# Patient Record
Sex: Female | Born: 1935 | Race: White | Hispanic: No | Smoking: Former smoker
Health system: Southern US, Community
[De-identification: ages and names within clinical notes are randomized; demographics above are authoritative.]

## PROBLEM LIST (undated history)

## (undated) DIAGNOSIS — E785 Hyperlipidemia, unspecified: Secondary | ICD-10-CM

## (undated) DIAGNOSIS — E039 Hypothyroidism, unspecified: Secondary | ICD-10-CM

## (undated) DIAGNOSIS — C50919 Malignant neoplasm of unspecified site of unspecified female breast: Secondary | ICD-10-CM

## (undated) DIAGNOSIS — I10 Essential (primary) hypertension: Secondary | ICD-10-CM

## (undated) DIAGNOSIS — H5440 Blindness, one eye, unspecified eye: Secondary | ICD-10-CM

## (undated) DIAGNOSIS — I252 Old myocardial infarction: Secondary | ICD-10-CM

## (undated) DIAGNOSIS — E559 Vitamin D deficiency, unspecified: Secondary | ICD-10-CM

## (undated) DIAGNOSIS — R296 Repeated falls: Secondary | ICD-10-CM

## (undated) DIAGNOSIS — J449 Chronic obstructive pulmonary disease, unspecified: Secondary | ICD-10-CM

## (undated) DIAGNOSIS — I2510 Atherosclerotic heart disease of native coronary artery without angina pectoris: Secondary | ICD-10-CM

## (undated) DIAGNOSIS — I509 Heart failure, unspecified: Secondary | ICD-10-CM

## (undated) DIAGNOSIS — I447 Left bundle-branch block, unspecified: Secondary | ICD-10-CM

## (undated) DIAGNOSIS — I951 Orthostatic hypotension: Secondary | ICD-10-CM

## (undated) DIAGNOSIS — C4491 Basal cell carcinoma of skin, unspecified: Secondary | ICD-10-CM

## (undated) DIAGNOSIS — I255 Ischemic cardiomyopathy: Secondary | ICD-10-CM

## (undated) DIAGNOSIS — J988 Other specified respiratory disorders: Secondary | ICD-10-CM

## (undated) DIAGNOSIS — G459 Transient cerebral ischemic attack, unspecified: Secondary | ICD-10-CM

## (undated) HISTORY — DX: Hypothyroidism, unspecified: E03.9

## (undated) HISTORY — PX: CARDIAC DEFIBRILLATOR PLACEMENT: SHX171

## (undated) HISTORY — DX: Old myocardial infarction: I25.2

## (undated) HISTORY — DX: Repeated falls: R29.6

## (undated) HISTORY — DX: Transient cerebral ischemic attack, unspecified: G45.9

## (undated) HISTORY — DX: Chronic obstructive pulmonary disease, unspecified: J44.9

## (undated) HISTORY — PX: MASTECTOMY: SHX3

## (undated) HISTORY — DX: Other specified respiratory disorders: J98.8

## (undated) HISTORY — DX: Basal cell carcinoma of skin, unspecified: C44.91

## (undated) HISTORY — DX: Left bundle-branch block, unspecified: I44.7

## (undated) HISTORY — PX: BASAL CELL CARCINOMA EXCISION: SHX1214

## (undated) HISTORY — PX: INSERT / REPLACE / REMOVE PACEMAKER: SUR710

## (undated) HISTORY — DX: Atherosclerotic heart disease of native coronary artery without angina pectoris: I25.10

## (undated) HISTORY — PX: ABDOMINAL HYSTERECTOMY: SHX81

## (undated) HISTORY — DX: Malignant neoplasm of unspecified site of unspecified female breast: C50.919

## (undated) HISTORY — DX: Hyperlipidemia, unspecified: E78.5

## (undated) HISTORY — DX: Essential (primary) hypertension: I10

## (undated) HISTORY — DX: Ischemic cardiomyopathy: I25.5

## (undated) HISTORY — PX: BREAST EXCISIONAL BIOPSY: SUR124

## (undated) HISTORY — DX: Vitamin D deficiency, unspecified: E55.9

## (undated) HISTORY — DX: Blindness, one eye, unspecified eye: H54.40

---

## 2007-03-10 ENCOUNTER — Encounter: Payer: Self-pay | Admitting: Cardiovascular Disease

## 2007-05-04 ENCOUNTER — Encounter: Admission: RE | Admit: 2007-05-04 | Discharge: 2007-05-04 | Payer: Self-pay | Admitting: Internal Medicine

## 2007-05-26 ENCOUNTER — Emergency Department: Payer: Self-pay | Admitting: Emergency Medicine

## 2007-05-30 ENCOUNTER — Emergency Department: Payer: Self-pay | Admitting: Unknown Physician Specialty

## 2007-06-11 ENCOUNTER — Ambulatory Visit: Payer: Self-pay | Admitting: Internal Medicine

## 2008-01-04 ENCOUNTER — Encounter: Payer: Self-pay | Admitting: Cardiovascular Disease

## 2008-01-20 ENCOUNTER — Encounter: Payer: Self-pay | Admitting: Cardiovascular Disease

## 2008-04-18 ENCOUNTER — Ambulatory Visit: Payer: Self-pay | Admitting: Gastroenterology

## 2008-07-08 ENCOUNTER — Emergency Department: Payer: Self-pay | Admitting: Internal Medicine

## 2008-08-19 ENCOUNTER — Other Ambulatory Visit: Payer: Self-pay

## 2008-08-19 ENCOUNTER — Inpatient Hospital Stay: Payer: Self-pay | Admitting: Internal Medicine

## 2008-10-25 ENCOUNTER — Ambulatory Visit: Payer: Self-pay | Admitting: Cardiovascular Disease

## 2008-10-25 ENCOUNTER — Encounter: Payer: Self-pay | Admitting: Internal Medicine

## 2009-03-13 ENCOUNTER — Encounter: Payer: Self-pay | Admitting: Cardiovascular Disease

## 2009-03-13 ENCOUNTER — Encounter: Payer: Self-pay | Admitting: Internal Medicine

## 2009-08-21 ENCOUNTER — Encounter: Payer: Self-pay | Admitting: Internal Medicine

## 2009-09-21 ENCOUNTER — Encounter: Payer: Self-pay | Admitting: Internal Medicine

## 2009-10-10 ENCOUNTER — Encounter: Payer: Self-pay | Admitting: Internal Medicine

## 2009-10-19 ENCOUNTER — Ambulatory Visit: Payer: Self-pay | Admitting: Otolaryngology

## 2009-10-23 ENCOUNTER — Encounter: Admission: RE | Admit: 2009-10-23 | Discharge: 2009-10-23 | Payer: Self-pay | Admitting: Internal Medicine

## 2009-10-23 ENCOUNTER — Ambulatory Visit: Payer: Self-pay | Admitting: Internal Medicine

## 2009-10-23 DIAGNOSIS — I10 Essential (primary) hypertension: Secondary | ICD-10-CM | POA: Insufficient documentation

## 2009-10-23 DIAGNOSIS — E782 Mixed hyperlipidemia: Secondary | ICD-10-CM | POA: Insufficient documentation

## 2009-10-23 DIAGNOSIS — I2510 Atherosclerotic heart disease of native coronary artery without angina pectoris: Secondary | ICD-10-CM | POA: Insufficient documentation

## 2009-11-17 DIAGNOSIS — C4491 Basal cell carcinoma of skin, unspecified: Secondary | ICD-10-CM

## 2009-11-17 HISTORY — DX: Basal cell carcinoma of skin, unspecified: C44.91

## 2009-12-09 ENCOUNTER — Emergency Department: Payer: Self-pay | Admitting: Internal Medicine

## 2009-12-17 ENCOUNTER — Observation Stay: Payer: Self-pay | Admitting: Internal Medicine

## 2010-01-28 ENCOUNTER — Encounter: Payer: Self-pay | Admitting: Internal Medicine

## 2010-02-06 ENCOUNTER — Ambulatory Visit: Payer: Self-pay | Admitting: Internal Medicine

## 2010-02-12 ENCOUNTER — Encounter: Payer: Self-pay | Admitting: Internal Medicine

## 2010-03-01 ENCOUNTER — Encounter: Payer: Self-pay | Admitting: Cardiovascular Disease

## 2010-03-05 ENCOUNTER — Encounter: Payer: Self-pay | Admitting: Cardiovascular Disease

## 2010-04-26 ENCOUNTER — Encounter: Payer: Self-pay | Admitting: Cardiovascular Disease

## 2010-07-09 ENCOUNTER — Encounter: Payer: Self-pay | Admitting: Cardiovascular Disease

## 2010-07-30 ENCOUNTER — Ambulatory Visit: Payer: Self-pay | Admitting: Cardiovascular Disease

## 2010-08-05 ENCOUNTER — Encounter: Payer: Self-pay | Admitting: Cardiovascular Disease

## 2010-08-06 ENCOUNTER — Encounter: Payer: Self-pay | Admitting: Cardiovascular Disease

## 2010-11-05 ENCOUNTER — Encounter
Admission: RE | Admit: 2010-11-05 | Discharge: 2010-11-05 | Payer: Self-pay | Source: Home / Self Care | Attending: Family Medicine | Admitting: Family Medicine

## 2010-11-06 ENCOUNTER — Telehealth: Payer: Self-pay | Admitting: Cardiovascular Disease

## 2010-12-08 ENCOUNTER — Encounter: Payer: Self-pay | Admitting: Family Medicine

## 2010-12-18 NOTE — Letter (Signed)
Summary: PHI  PHI   Imported By: Harlon Flor 08/05/2010 13:01:21  _____________________________________________________________________  External Attachment:    Type:   Image     Comment:   External Document

## 2010-12-18 NOTE — Letter (Signed)
Summary: Remote Device Check  [employer], Main Office  [address]   [place], [place] [address]   Phone: [phone]  Fax: [phone]     February 12, 2010 MRN: [id number]   [facility] [address] [place], [place]  [address]   Dear Ms. Rikard,   Your remote transmission was recieved and reviewed by your physician.  All diagnostics were within normal limits for you.   ___X___Your next office visit is scheduled for:  JUNE 2011 WITH DR Graciela Husbands. Please call our office to schedule an appointment.    Sincerely,  Proofreader

## 2010-12-18 NOTE — Letter (Signed)
Summary: [facility]  [facility]   Imported By: Harlon Flor 08/07/2010 16:19:01  _____________________________________________________________________  External Attachment:    Type:   Image     Comment:   External Document

## 2010-12-18 NOTE — Assessment & Plan Note (Signed)
Summary: NP6/AMD   Visit Type:  Initial Consult Primary Provider:  Dr. Andrey Spearman  CC:  Establish care with a cardiologist. Former patient of Dr. Kirke Corin..  History of Present Illness: Erica [name] is a 77 year old lady with Nonischemic cardiomyopathy CRT-D that was implanted at [facility] about one year ago with class 2-3 congestive heart failure with left bundle branch block, coronary artery disease with stenting to her RCA, hyperlipidemia, hypertension who presents to establish care.  overall she feels well. She denies any chest pain. She does have some shortness of breath with exertion. She denies any cough or lower extremity edema. Overall she reports that she is stable with no complaints. She has always had an elevated heart rate. She has not been able to tolerate Coreg 25 mg b.i.d.  Echocardiogram April 2011 showing mild LV systolic dysfunction, ejection fraction 43%, mildly dilated left atrium, mild pulmonary hypertension with right ventricular systolic pressure of 33  Stress test April 2011 showing normal study with normal ejection fraction. Ejection fraction estimated at 79%. This was a lack CT scan.  Notes indicate cardiac catheterization in January 2008 showing 40% restenosis of mid RCA stent and 50% mid LAD disease. Notes indicate 2 stents in her RCA.  Catheterization October 2009 showing 40-50% LAD disease, 40% RCA disease, ejection fraction less than 20%.   CT scan report with concerns regarding descriptions of subclavian stenosis  EKG shows paced rhythm at 88 beats per minute Cholesterol from January shows LDL 112, total cholesterol 184, HDL 49    Current Medications (verified): 1)  Aspirin 81 Mg Tbec (Aspirin) .... Take One Tablet By Mouth Daily 2)  Diovan 80 Mg Tabs (Valsartan) .... Take One Tablet By Mouth Daily 3)  Carvedilol 12.5 Mg Tabs (Carvedilol) .... Take One Tablet By Mouth Twice A Day 4)  Furosemide 40 Mg Tabs (Furosemide) .... Take One Tablet By Mouth Daily As  Needed 5)  Plavix 75 Mg Tabs (Clopidogrel Bisulfate) .... Take One Tablet By Mouth Daily 6)  Calcium + D 600-200 Mg-Unit Tabs (Calcium Carbonate-Vitamin D) .... Take One Tablet By Mouth Once Daily. 7)  Vitamin B-12 1000 Mcg Tabs (Cyanocobalamin) .... Take One Tablet By Mouth Once Daily. 8)  Fish Oil   Oil (Fish Oil) .... 1000mg  Take One Tablet By Mouth Once Daily. 9)  Vitamin C 500 Mg Tabs (Ascorbic Acid) .... Take One Tablet By Mouth Once Daily. 10)  Levothyroxine Sodium 25 Mcg Tabs (Levothyroxine Sodium) .... Take One Tablet By Mouth Once Daily. 11)  Lorazepam 1 Mg Tabs (Lorazepam) .... One Tablet At Bedtime 12)  Prilosec 20 Mg Cpdr (Omeprazole) .... Take One Tablet By Mouth Once Daily. 13)  Iron Supplement 325 (65 Fe) Mg Tabs (Ferrous Sulfate) .... One Tablet Once Daily 14)  Fenofibrate 160 Mg Tabs (Fenofibrate) .... One Tablet Once Daily  Allergies (verified): 1)  ! Ace Inhibitors 2)  ! * Caduet 3)  ! Cipro 4)  ! * Eyrthromycin Derivatives 5)  ! * Quinolones 6)  ! * Johavahs Witness-No Blood Transfusions!  Past History:  Family History: Last updated: 10/23/2009 Family History of Cancer:  Family History of Coronary Artery Disease:  Family History of Hypertension:   Social History: Last updated: 10/23/2009 Retired  Married  Tobacco Use - No.  Alcohol Use - no  Risk Factors: Smoking Status: never (10/23/2009)  Past Medical History: Congestive heart failure-systolic Coronary artery disease status post MI x2 Stents-RCA x2 Status post CRT D. cx by LSCVenous thrombosis Nonischemic/ischemic cardiomyopathy dyslipidemia Hypertension COPD Breast  Cancer Hypothyroidism Pneumonia pacemaker/Defib  Past Surgical History: Mastectomy Hysterectomy pacermaker/Defib implant 2010  Review of Systems       The patient complains of dyspnea on exertion.  The patient denies fever, weight loss, weight gain, vision loss, decreased hearing, hoarseness, chest pain, syncope, peripheral  edema, prolonged cough, abdominal pain, incontinence, muscle weakness, depression, and enlarged lymph nodes.    Vital Signs:  Patient profile:   77 year old female Height:      62 inches Weight:      156 pounds BMI:     28.64 Pulse rate:   93 / minute BP sitting:   126 / 75  (left arm) Cuff size:   regular  Vitals Entered By: Bishop Dublin, CMA (July 30, 2010 3:22 PM)  Physical Exam  General:  Well developed, well nourished, in no acute distress. Head:  normocephalic and atraumatic Neck:  Neck supple, no JVD. No masses, thyromegaly or abnormal cervical nodes. Lungs:  Clear bilaterally to auscultation and percussion. Heart:  Non-displaced PMI, chest non-tender; regular rate and rhythm, S1, S2 without murmurs, rubs or gallops. Carotid upstroke normal, no bruit. Pedals normal pulses. No edema, no varicosities. Abdomen:   abdomen soft and non-tender without masses Msk:  Back normal, normal gait. Muscle strength and tone normal. Pulses:  pulses normal in all 4 extremities Extremities:  No clubbing or cyanosis. Neurologic:  Alert and oriented x 3. Skin:  Intact without lesions or rashes. Psych:  Normal affect.    ICD Specifications Following MD:  Sherryl Manges, MD     ICD Vendor:  [employer]     ICD Model Number:  [id number]     ICD Serial Number:  [id number] ICD DOI:  12/01/2008     ICD Implanting MD:  NOT IMPLANTED HERE  Lead 1:    Location: RA     DOI: 12/01/2008     Model #: [id number]     Serial #: [id number]     Status: active Lead 2:    Location: RV     DOI: 12/01/2008     Model #: [id number]     Serial #: [id number]     Status: active Lead 3:    Location: LV     DOI: 12/01/2008     Model #: [id number]     Serial #: [id number]     Status: active  Indications::  CHF, CARDIOMYOPATHY   ICD Follow Up ICD Dependent:  No       ICD Device Measurements Configuration: BIPOLAR  Brady Parameters Mode DDD     Lower Rate Limit:  50     Upper Rate Limit 130 PAV 130     Sensed AV Delay:   100  Tachy Zones VF:  200     VT:  182     Impression & Recommendations:  Problem # 1:  CHRONIC SYSTOLIC HEART FAILURE (ICD-428.22) on clinical exam, she appears to be relatively stable with no significant fluid overload. She does have mild shortness of breath with exertion which may be secondary to her underlying LV dysfunction, general deconditioned state.  She has complained about the cost of Diovan and we will change her to losartan 25 mg daily, possibly titrating up to 50 mg if tolerated. She has been unable to tolerate Coreg 25 mg daily and her heart rate is elevated. On her next visit, we will suggest she try a slightly higher dose of Coreg (18 mg two times a day).  Her updated medication list for this  problem includes:    Aspirin 81 Mg Tbec (Aspirin) .Marland Kitchen... Take one tablet by mouth daily    Losartan Potassium 50 Mg Tabs (Losartan potassium) .Marland Kitchen... Take 1/2  tablet by mouth once a day    Carvedilol 12.5 Mg Tabs (Carvedilol) .Marland Kitchen... Take one tablet by mouth twice a day    Furosemide 40 Mg Tabs (Furosemide) .Marland Kitchen... Take one tablet by mouth daily as needed    Plavix 75 Mg Tabs (Clopidogrel bisulfate) .Marland Kitchen... Take one tablet by mouth daily  Problem # 2:  HYPERLIPIDEMIA, MIXED (ICD-272.2) We have talked to her about her cholesterol, the fact that she has underlying coronary artery disease and a history of stenting to her RCA. We will start her on simvastatin 10 mg every other day and hopefully titrate this upwards as tolerated. She does have a history of leg cramping. She can hold her fenofibrate and she is not taking this anyway.  Her updated medication list for this problem includes:    Fenofibrate 160 Mg Tabs (Fenofibrate) ..... One tablet once daily    Simvastatin 20 Mg Tabs (Simvastatin) .Marland Kitchen... Take 1/2 tablet by mouth daily at bedtime  Problem # 3:  HYPERTENSION (ICD-401.9) Blood pressure is well controlled on today's visit. We will change her Diovan to losartan at her request.  Her  updated medication list for this problem includes:    Aspirin 81 Mg Tbec (Aspirin) .Marland Kitchen... Take one tablet by mouth daily    Losartan Potassium 50 Mg Tabs (Losartan potassium) .Marland Kitchen... Take 1/2  tablet by mouth once a day    Carvedilol 12.5 Mg Tabs (Carvedilol) .Marland Kitchen... Take one tablet by mouth twice a day    Furosemide 40 Mg Tabs (Furosemide) .Marland Kitchen... Take one tablet by mouth daily as needed  Problem # 4:  CAD (ICD-414.00) She does have underlying coronary artery disease and we need to push her lipids to goal. Current LDL is 112.  Her updated medication list for this problem includes:    Aspirin 81 Mg Tbec (Aspirin) .Marland Kitchen... Take one tablet by mouth daily    Carvedilol 12.5 Mg Tabs (Carvedilol) .Marland Kitchen... Take one tablet by mouth twice a day    Plavix 75 Mg Tabs (Clopidogrel bisulfate) .Marland Kitchen... Take one tablet by mouth daily  Patient Instructions: 1)  Your physician has recommended you make the following change in your medication: START simvastatin 20mg  take 1/2 tab every other day, losartan 50mg  take 1/2 tab. STOP diovan  2)  Your physician wants you to follow-up in:   6 months You will receive a reminder letter in the mail two months in advance. If you don't receive a letter, please call our office to schedule the follow-up appointment. Prescriptions: SIMVASTATIN 20 MG TABS (SIMVASTATIN) Take 1/2 tablet by mouth daily at bedtime  #30 x 4   Entered by:   Benedict Needy, RN   Authorized by:   Dossie Arbour MD   Signed by:   Benedict Needy, RN on 07/30/2010   Method used:   Electronically to        [id number] (retail[facility]       [address]       [place], [place]  [address]       Ph: [phone]       Fax: [phone]   RxID:   [id number] LOSARTAN POTASSIUM 50 MG TABS (LOSARTAN POTASSIUM) Take 1/2  tablet by mouth once a day  #30 x 4   Entered by:   Benedict Needy, RN  Authorized by:   Dossie Arbour MD   Signed by:   Benedict Needy, RN on 07/30/2010   Method used:   Electronically to         [id number] (retail[facility]       [address]       [place], [place]  [address]       Ph: [phone]       Fax: [phone]   RxID:   [id number]

## 2010-12-18 NOTE — Letter (Signed)
Summary: Medical Record Release  Medical Record Release   Imported By: Harlon Flor 07/09/2010 16:35:50  _____________________________________________________________________  External Attachment:    Type:   Image     Comment:   External Document

## 2010-12-18 NOTE — Letter (Signed)
Summary: [employer], newspaper, Main Office  [address]   [place], [place] [address]   Phone: [phone]  Fax: [phone]     January 28, 2010 MRN: [id number]   [facility] [address] [place], [place]  [address]   Dear Erica [name],  According to our records, you were scheduled for a device phone transmission on  January 22, 2010.     We did not receive any results from this check.  If you transmitted on your scheduled day, please call us to help troubleshoot your system.  If you forgot to send your transmission, please send one upon receipt of this letter.  Thank you,   [facility]

## 2010-12-18 NOTE — Letter (Signed)
Summary: Power of [employer] of Attorney   Imported By: Harlon Flor 08/05/2010 13:01:39  _____________________________________________________________________  External Attachment:    Type:   Image     Comment:   External Document

## 2010-12-18 NOTE — Cardiovascular Report (Signed)
Summary: Office Visit Remote   Office Visit Remote   Imported By: Roderic Ovens 02/14/2010 11:58:55  _____________________________________________________________________  External Attachment:    Type:   Image     Comment:   External Document

## 2010-12-19 NOTE — Progress Notes (Signed)
Summary: RX  Phone Note Refill Request Call back at Home Phone [phone] Message from:  Patient on November 06, 2010 11:39 AM  Refills Requested: Medication #1:  PLAVIX 75 MG TABS Take one tablet by mouth daily  Medication #2:  CARVEDILOL 12.5 MG TABS Take one tablet by mouth twice a day  Medication #3:  SIMVASTATIN 20 MG TABS Take 1/2 tablet by mouth daily at bedtime. [facility] [address]  Initial call taken by: Harlon Flor,  November 06, 2010 11:40 AM    Prescriptions: SIMVASTATIN 20 MG TABS (SIMVASTATIN) Take 1/2 tablet by mouth daily at bedtime  #30 x 6   Entered by:   Lysbeth Galas CMA   Authorized by:   Dossie Arbour MD   Signed by:   Lysbeth Galas CMA on 11/06/2010   Method used:   Electronically to        [id number] ([facility]       [address]       [place], [place]  [address]       Ph: [phone]       Fax: [phone]   RxID:   [id number] CARVEDILOL 12.5 MG TABS (CARVEDILOL) Take one tablet by mouth twice a day  #90 x 6   Entered by:   Lysbeth Galas CMA   Authorized by:   Dossie Arbour MD   Signed by:   Lysbeth Galas CMA on 11/06/2010   Method used:   Electronically to        [id number] ([facility]       [address]       [place], [place]  [address]       Ph: [phone]       Fax: [phone]   RxID:   [id number] PLAVIX 75 MG TABS (CLOPIDOGREL BISULFATE) Take one tablet by mouth daily  #30 x 6   Entered by:   Lysbeth Galas CMA   Authorized by:   Dossie Arbour MD   Signed by:   Lysbeth Galas CMA on 11/06/2010   Method used:   Electronically to        [id number] ([facility]       [address]       [place], [place]  [address]       Ph: [phone]       Fax: [phone]   RxID:   [id number]

## 2010-12-26 ENCOUNTER — Encounter: Payer: Self-pay | Admitting: Internal Medicine

## 2010-12-26 ENCOUNTER — Encounter (INDEPENDENT_AMBULATORY_CARE_PROVIDER_SITE_OTHER): Payer: Medicare PPO

## 2010-12-26 DIAGNOSIS — I428 Other cardiomyopathies: Secondary | ICD-10-CM

## 2011-01-07 ENCOUNTER — Encounter: Payer: Self-pay | Admitting: Internal Medicine

## 2011-01-08 NOTE — Procedures (Signed)
Summary: Defib Check/#[id number]/AMD   Current Medications (verified): 1)  Aspirin 81 Mg Tbec (Aspirin) .... Take One Tablet By Mouth Daily 2)  Losartan Potassium 50 Mg Tabs (Losartan Potassium) .... Take 1/2  Tablet By Mouth Once A Day 3)  Carvedilol 12.5 Mg Tabs (Carvedilol) .... Take One Tablet By Mouth Twice A Day 4)  Furosemide 40 Mg Tabs (Furosemide) .... Take One Tablet By Mouth Daily As Needed 5)  Plavix 75 Mg Tabs (Clopidogrel Bisulfate) .... Take One Tablet By Mouth Daily 6)  Calcium + D 600-200 Mg-Unit Tabs (Calcium Carbonate-Vitamin D) .... Take One Tablet By Mouth Once Daily. 7)  Vitamin B-12 1000 Mcg Tabs (Cyanocobalamin) .... Take One Tablet By Mouth Once Daily. 8)  Vitamin C 500 Mg Tabs (Ascorbic Acid) .... Take One Tablet By Mouth Once Daily. 9)  Levothyroxine Sodium 50 Mcg Tabs (Levothyroxine Sodium) .... Take One Tablet By Mouth Once Daily. 10)  Lorazepam 1 Mg Tabs (Lorazepam) .... One Tablet At Bedtime 11)  Prilosec 20 Mg Cpdr (Omeprazole) .... As Needed. 12)  Iron Supplement 325 (65 Fe) Mg Tabs (Ferrous Sulfate) .... One Tablet Once Daily 13)  Simvastatin 20 Mg Tabs (Simvastatin) .... Take 1/2 Tablet By Mouth Daily At Bedtime  Allergies (verified): 1)  ! Ace Inhibitors 2)  ! * Caduet 3)  ! Cipro 4)  ! * Eyrthromycin Derivatives 5)  ! * Quinolones 6)  ! * Johavahs Witness-No Blood Transfusions!    ICD Specifications Following MD:  Sherryl Manges, MD     ICD Vendor:  [employer]     ICD Model Number:  [id number]     ICD Serial Number:  [id number] ICD DOI:  12/01/2008     ICD Implanting MD:  NOT IMPLANTED HERE  Lead 1:    Location: RA     DOI: 12/01/2008     Model #: 3086     Serial #: [id number]     Status: active Lead 2:    Location: RV     DOI: 12/01/2008     Model #: 2841     Serial #: [id number]     Status: active Lead 3:    Location: LV     DOI: 12/01/2008     Model #: 2536     Serial #: [id number]     Status: active  Indications::  CHF, CARDIOMYOPATHY   ICD  Follow Up Remote Check?  No Battery Voltage:  3.09 V     Charge Time:  9.7 seconds     Underlying rhythm:  SR ICD Dependent:  No       ICD Device Measurements Atrium:  Amplitude: 2.3 mV, Impedance: 418 ohms, Threshold: 0.625 V at 0.4 msec Right Ventricle:  Amplitude: 11.4 mV, Impedance: 684 ohms, Threshold: 0.875 V at 0.4 msec Left Ventricle:  Impedance: 836 ohms, Threshold: 0.875 V at 0.4 msec Configuration: BIPOLAR Shock Impedance: 39/52 ohms   Episodes MS Episodes:  0     Percent Mode Switch:  0     Coumadin:  No Shock:  0     ATP:  0     Nonsustained:  0     Atrial Pacing:  0.2%     Ventricular Pacing:  100%  Brady Parameters Mode DDD     Lower Rate Limit:  50     Upper Rate Limit 130 PAV 130     Sensed AV Delay:  100  Tachy Zones VF:  200     VT:  182  Next Cardiology Appt Due:  03/18/2011 Tech Comments:  Lead impedance alert values reprogrammed.  Optivol and thoracic impedance abnormal .   Ms. Pharo takes her furosemide 40mg  on an as needed basis.  No ankle edema noted but some abdominal bloating.  She will start taking the furosemide 20mg  one every other day per Dr. Mariah Milling.  ROV 3 months with Dr. Graciela Husbands in [place]. Altha Harm, LPN  December 26, 2010 4:19 PM

## 2011-01-16 ENCOUNTER — Encounter: Payer: Self-pay | Admitting: Cardiovascular Disease

## 2011-01-16 ENCOUNTER — Ambulatory Visit (INDEPENDENT_AMBULATORY_CARE_PROVIDER_SITE_OTHER): Payer: Medicare PPO | Admitting: Cardiovascular Disease

## 2011-01-16 DIAGNOSIS — E785 Hyperlipidemia, unspecified: Secondary | ICD-10-CM

## 2011-01-16 DIAGNOSIS — I10 Essential (primary) hypertension: Secondary | ICD-10-CM

## 2011-01-16 DIAGNOSIS — I2510 Atherosclerotic heart disease of native coronary artery without angina pectoris: Secondary | ICD-10-CM

## 2011-01-20 ENCOUNTER — Encounter: Payer: Self-pay | Admitting: Cardiology

## 2011-01-20 ENCOUNTER — Other Ambulatory Visit (INDEPENDENT_AMBULATORY_CARE_PROVIDER_SITE_OTHER): Payer: Medicare PPO

## 2011-01-20 ENCOUNTER — Encounter: Payer: Self-pay | Admitting: Cardiovascular Disease

## 2011-01-20 DIAGNOSIS — E785 Hyperlipidemia, unspecified: Secondary | ICD-10-CM

## 2011-01-23 ENCOUNTER — Ambulatory Visit: Payer: Self-pay | Admitting: Family Medicine

## 2011-01-23 NOTE — Assessment & Plan Note (Signed)
Summary: FOLLOW UP - 6 MONTHS   Visit Type:  Follow-up Primary Provider:  Dr. Andrey Spearman  CC:  "Doing well." She has a cold and taking antibiotics and inhaler (advair)..  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  overall, she is doing well. She is recovering from bronchitis and is currently on antibiotic and Advair. She did fall out of her bed recently and bruised her knee and ankle. No significant symptoms of shortness of breath, chest pain and is otherwise very active. She has been tolerating simvastatin well with no problems though she does have occasional leg cramps.  Blood pressure at home has been running mildly elevated though she has been stressed about her husband and has bronchitis.   She has not been able to tolerate Coreg 25 mg b.i.d. in the past  Echocardiogram April 2011 showing mild LV systolic dysfunction, ejection fraction 43%, mildly dilated left atrium, mild pulmonary hypertension with right ventricular systolic pressure of 33  Stress test April 2011 showing normal study with normal ejection fraction. Ejection fraction estimated at 79%. This was a lexiscan.  Notes indicate cardiac catheterization in January 2008 showing 40% restenosis of mid RCA stent and 50% mid LAD disease. Notes indicate 2 stents in her RCA.  Catheterization October 2009 showing 40-50% LAD disease, 40% RCA disease, ejection fraction less than 20%.   CT scan report with concerns regarding subclavian stenosis  EKG shows paced rhythm at 88 beats per minute Cholesterol from January shows LDL 112, total cholesterol 184, HDL 49    Current Medications (verified): 1)  Aspirin 81 Mg Tbec (Aspirin) .... Take One Tablet By  Mouth Daily 2)  Losartan Potassium 50 Mg Tabs (Losartan Potassium) .... Take 1/2  Tablet By Mouth Once A Day 3)  Carvedilol 12.5 Mg Tabs (Carvedilol) .... Take One Tablet By Mouth Twice A Day 4)  Furosemide 40 Mg Tabs (Furosemide) .... Take One Tablet By Mouth Daily As Needed 5)  Plavix 75 Mg Tabs (Clopidogrel Bisulfate) .... Take One Tablet By Mouth Daily 6)  Calcium + D 600-200 Mg-Unit Tabs (Calcium Carbonate-Vitamin D) .... Take One Tablet By Mouth Once Daily. 7)  Vitamin B-12 1000 Mcg Tabs (Cyanocobalamin) .... Take One Tablet By Mouth Once Daily. 8)  Vitamin C 500 Mg Tabs (Ascorbic Acid) .... Take One Tablet By Mouth Once Daily. 9)  Levothyroxine Sodium 50 Mcg Tabs (Levothyroxine Sodium) .... Take One Tablet By Mouth Once Daily. 10)  Lorazepam 1 Mg Tabs (Lorazepam) .... One Tablet At Bedtime 11)  Prilosec 20 Mg Cpdr (Omeprazole) .... As Needed. 12)  Iron Supplement 325 (65 Fe) Mg Tabs (Ferrous Sulfate) .... One Tablet Once Daily 13)  Simvastatin 20 Mg Tabs (Simvastatin) .... Take 1/2 Tablet By Mouth Daily At Bedtime 14)  Ceftin 500 Mg Tabs (Cefuroxime Axetil) .... One Tablet Two Times A Day 15)  Advair Diskus 100-50 Mcg/dose Aepb (Fluticasone-Salmeterol) .... Two Puffs Two Times A Day  Allergies (verified): 1)  ! Ace Inhibitors 2)  ! * Caduet 3)  ! Cipro 4)  ! * Eyrthromycin Derivatives 5)  ! * Quinolones 6)  ! * [place] Witness-No Blood Transfusions!  Past History:  Past Medical History: Last updated: 07/30/2010 Congestive heart failure-systolic Coronary artery disease status post MI x2 Stents-RCA  x2 Status post CRT D. cx by LSCVenous thrombosis Nonischemic/ischemic cardiomyopathy dyslipidemia Hypertension COPD Breast Cancer Hypothyroidism Pneumonia pacemaker/Defib  Past Surgical History: Last updated: 07/30/2010 Mastectomy Hysterectomy pacermaker/Defib implant 2010  Family History: Last updated: 10/23/2009 Family History of Cancer:  Family History of Coronary  Artery Disease:  Family History of Hypertension:   Social History: Last updated: 10/23/2009 Retired  Married  Tobacco Use - No.  Alcohol Use - no  Risk Factors: Smoking Status: never (10/23/2009)  Review of Systems  The patient denies fever, weight loss, weight gain, vision loss, decreased hearing, hoarseness, chest pain, syncope, dyspnea on exertion, peripheral edema, prolonged cough, abdominal pain, incontinence, muscle weakness, depression, and enlarged lymph nodes.         Cough, sputum, sore ankle and knee  Vital Signs:  Patient profile:   77 year old female Height:      62 inches Weight:      162 pounds BMI:     29.74 Pulse rate:   84 / minute BP sitting:   155 / 85  (left arm) Cuff size:   regular  Vitals Entered By: Bishop Dublin, CMA (January 16, 2011 9:57 AM)   Physical Exam  General:  Well developed, well nourished, in no acute distress. Head:  normocephalic and atraumatic Neck:  Neck supple, no JVD. No masses, thyromegaly or abnormal cervical nodes. Lungs:  Clear bilaterally to auscultation and percussion. Heart:  Non-displaced PMI, chest non-tender; regular rate and rhythm, S1, S2 without murmurs, rubs or gallops. Carotid upstroke normal, no bruit. Pedals normal pulses. No edema, no varicosities. Abdomen:   abdomen soft and non-tender without masses Msk:  Back normal, normal gait. Muscle strength and tone normal. the knee on the right is tender, bruised toes Pulses:  pulses normal in all 4 extremities Extremities:  No clubbing or cyanosis. Neurologic:  Alert and oriented x 3. Skin:  Intact without lesions or rashes. Psych:  Normal affect.    ICD Specifications Following MD:  Sherryl Manges, MD     ICD Vendor:  [employer]     ICD Model Number:  [id number]     ICD Serial Number:  [id number] ICD DOI:  12/01/2008     ICD Implanting MD:  NOT IMPLANTED HERE  Lead 1:    Location: RA     DOI: 12/01/2008     Model #: [id number]     Serial #: [id number]     Status:  active Lead 2:    Location: RV     DOI: 12/01/2008     Model #: [id number]     Serial #: [id number]     Status: active Lead 3:    Location: LV     DOI: 12/01/2008     Model #: [id number]     Serial #: [id number]     Status: active  Indications::  CHF, CARDIOMYOPATHY   ICD Follow Up ICD Dependent:  No       ICD Device Measurements Configuration: BIPOLAR  Episodes Coumadin:  No  Brady Parameters Mode DDD     Lower Rate Limit:  50     Upper Rate Limit 130 PAV 130     Sensed AV Delay:  100  Tachy Zones VF:  200     VT:  182     Impression & Recommendations:  Problem # 1:  HYPERTENSION (ICD-401.9)  blood pressure is elevated today. Recheck confirmed systolic pressure 160. We have asked her to monitor her blood pressure at home. She reports  being stressed, and possibly it is higher given her active bronchitis. She will  record her blood pressure and bring these to the clinic in the next week or so for further medication titration. His blood pressure is elevated, we could increase her losartan. She has not tolerated higher doses of Coreg in the past.  Her updated medication list for this problem includes:    Aspirin 81 Mg Tbec (Aspirin) .Marland Kitchen... Take one tablet by mouth daily    Losartan Potassium 50 Mg Tabs (Losartan potassium) .Marland Kitchen... Take 1/2  tablet by mouth once a day    Carvedilol 12.5 Mg Tabs (Carvedilol) .Marland Kitchen... Take one tablet by mouth twice a day    Furosemide 40 Mg Tabs (Furosemide) .Marland Kitchen... Take one tablet by mouth daily as needed  Her updated medication list for this problem includes:    Aspirin 81 Mg Tbec (Aspirin) .Marland Kitchen... Take one tablet by mouth daily    Losartan Potassium 50 Mg Tabs (Losartan potassium) .Marland Kitchen... Take 1/2  tablet by mouth once a day    Carvedilol 12.5 Mg Tabs (Carvedilol) .Marland Kitchen... Take one tablet by mouth twice a day    Furosemide 40 Mg Tabs (Furosemide) .Marland Kitchen... Take one tablet by mouth daily as needed  Her updated medication list for this problem includes:    Aspirin 81 Mg  Tbec (Aspirin) .Marland Kitchen... Take one tablet by mouth daily    Losartan Potassium 50 Mg Tabs (Losartan potassium) .Marland Kitchen... Take 1/2  tablet by mouth once a day    Carvedilol 12.5 Mg Tabs (Carvedilol) .Marland Kitchen... Take one tablet by mouth twice a day    Furosemide 40 Mg Tabs (Furosemide) .Marland Kitchen... Take one tablet by mouth daily as needed  Problem # 2:  CHRONIC SYSTOLIC HEART FAILURE (ICD-428.22)  Ejection fraction was only mildly depressed on recent echocardiogram in 2011. Continue medical management and diuresis. We will try to obtain her most recent creatinine prior records.  Her updated medication list for this problem includes:    Aspirin 81 Mg Tbec (Aspirin) .Marland Kitchen... Take one tablet by mouth daily    Losartan Potassium 50 Mg Tabs (Losartan potassium) .Marland Kitchen... Take 1/2  tablet by mouth once a day    Carvedilol 12.5 Mg Tabs (Carvedilol) .Marland Kitchen... Take one tablet by mouth twice a day    Furosemide 40 Mg Tabs (Furosemide) .Marland Kitchen... Take one tablet by mouth daily as needed    Plavix 75 Mg Tabs (Clopidogrel bisulfate) .Marland Kitchen... Take one tablet by mouth daily  Her updated medication list for this problem includes:    Aspirin 81 Mg Tbec (Aspirin) .Marland Kitchen... Take one tablet by mouth daily    Losartan Potassium 50 Mg Tabs (Losartan potassium) .Marland Kitchen... Take 1/2  tablet by mouth once a day    Carvedilol 12.5 Mg Tabs (Carvedilol) .Marland Kitchen... Take one tablet by mouth twice a day    Furosemide 40 Mg Tabs (Furosemide) .Marland Kitchen... Take one tablet by mouth daily as needed    Plavix 75 Mg Tabs (Clopidogrel bisulfate) .Marland Kitchen... Take one tablet by mouth daily  Problem # 3:  HYPERLIPIDEMIA, MIXED (ICD-272.2)  Previous cholesterol in January 2011 was above goals. We have suggested she recheck her cholesterol in the next several weeks. Goal LDL is less than 70.  Her updated medication list for this problem includes:    Simvastatin 20 Mg Tabs (Simvastatin) .Marland Kitchen... Take 1/2 tablet by mouth daily at bedtime  Her updated medication list for this problem includes:     Simvastatin 20 Mg Tabs (Simvastatin) .Marland Kitchen... Take 1/2 tablet by  mouth daily at bedtime  Problem # 4:  CAD (ICD-414.00)  She does have underlying coronary artery disease. Currently with no angina. We will continue aggressive lipid management.  Her updated medication list for this problem includes:    Aspirin 81 Mg Tbec (Aspirin) .Marland Kitchen... Take one tablet by mouth daily    Carvedilol 12.5 Mg Tabs (Carvedilol) .Marland Kitchen... Take one tablet by mouth twice a day    Plavix 75 Mg Tabs (Clopidogrel bisulfate) .Marland Kitchen... Take one tablet by mouth daily  Her updated medication list for this problem includes:    Aspirin 81 Mg Tbec (Aspirin) .Marland Kitchen... Take one tablet by mouth daily    Carvedilol 12.5 Mg Tabs (Carvedilol) .Marland Kitchen... Take one tablet by mouth twice a day    Plavix 75 Mg Tabs (Clopidogrel bisulfate) .Marland Kitchen... Take one tablet by mouth daily  Problem # 5:  CRT-D MDT (ICD-V45.02) Pacemaker/ICD followed by [facility].   Patient Instructions: 1)  Your physician recommends that you schedule a follow-up appointment in: 6 months 2)  Your physician recommends that you return for a FASTING lipid profile:  3)  Your physician has requested that you regularly monitor and record your blood pressure readings at home.  Please use the same machine at the same time of day to check your readings and record them to bring to your follow-up visit. 4)  PLEASE SEND [place] A COPY OF BP RESULTS IN 2 WEEKS. Prescriptions: FUROSEMIDE 40 MG TABS (FUROSEMIDE) Take one tablet by mouth daily as needed  #30 x 6   Entered by:   Lanny Hurst RN   Authorized by:   Dossie Arbour MD   Signed by:   Lanny Hurst RN on 01/16/2011   Method used:   Electronically to        [id number] (retail)       [address]       [place], [place]  [address]       Ph: [phone]       Fax: [phone]   RxID:   [id number]

## 2011-01-23 NOTE — Cardiovascular Report (Signed)
Summary: Office Visit   Office Visit   Imported By: Roderic Ovens 01/13/2011 15:44:41  _____________________________________________________________________  External Attachment:    Type:   Image     Comment:   External Document

## 2011-01-24 LAB — CONVERTED CEMR LAB
Albumin: 3.7 g/dL (ref 3.5–5.2)
Alkaline Phosphatase: 56 units/L (ref 39–117)
HDL: 45 mg/dL (ref >39)
Indirect Bilirubin: 0.4 mg/dL (ref 0.0–0.9)
Total Bilirubin: 0.5 mg/dL (ref 0.3–1.2)
Total Protein: 6.8 g/dL (ref 6.0–8.3)
Triglycerides: 153 mg/dL — ABNORMAL HIGH (ref <150)

## 2011-01-28 ENCOUNTER — Telehealth: Payer: Self-pay | Admitting: Cardiovascular Disease

## 2011-01-29 ENCOUNTER — Encounter: Payer: Self-pay | Admitting: Cardiovascular Disease

## 2011-01-29 ENCOUNTER — Other Ambulatory Visit (INDEPENDENT_AMBULATORY_CARE_PROVIDER_SITE_OTHER): Payer: Medicare PPO

## 2011-01-29 ENCOUNTER — Encounter: Payer: Self-pay | Admitting: Cardiology

## 2011-01-29 DIAGNOSIS — I2510 Atherosclerotic heart disease of native coronary artery without angina pectoris: Secondary | ICD-10-CM

## 2011-02-03 LAB — CONVERTED CEMR LAB
CO2: 24 meq/L (ref 19–32)
Calcium: 8.6 mg/dL (ref 8.4–10.5)
Potassium: 4.7 meq/L (ref 3.5–5.3)
Sodium: 136 meq/L (ref 135–145)

## 2011-02-04 NOTE — Letter (Addendum)
Summary: At Home BP Readings  At Home BP Readings   Imported By: Harlon Flor 01/31/2011 11:56:39  _____________________________________________________________________  External Attachment:    Type:   Image     Comment:   External Document  Appended Document: At Home BP Readings Pt's BP readins 2 weeks post ov with Dr. Mariah Milling. Preliminarily reviewed. Forwarded to MD desktop for review and signature /MES  Appended Document: At Home BP Readings BPs ok. Up and down a little but otherwise not a problem. Would not change any medications, continue as she is doing  Appended Document: At Home BP Readings notified patient of blood pressure readings.

## 2011-02-04 NOTE — Progress Notes (Signed)
Summary: BMP scheduled  ---- Converted from flag ---- ---- 01/28/2011 10:04 AM, Lanny Hurst RN wrote: Erica [name] to pt, she is scheduled for BMP tomorrow am 01/29/11.  ---- 01/27/2011 1:54 PM, Lanny Hurst RN wrote: Erica [name] TCB  ---- 01/26/2011 8:14 PM, Dossie Arbour MD wrote: probably needs one as she is on lasix Thx  ---- 01/24/2011 9:49 AM, Lanny Hurst RN wrote: Sorry, when pt seen in office you had just asked me to get last BMP from Dr. Kathrene Alu, they said they do not have one. Just following up, and asking if you want me to have pt come in our office to draw BMP.  ---- 01/23/2011 11:16 PM, Dossie Arbour MD wrote: have her come in? Don't understand Can they send BMP to [place]?  ---- 01/23/2011 4:44 PM, Lanny Hurst RN wrote: I checked with Dr. Walker Shadow office, they do not have a BMP for pt, you want me to have her come in? thanks, Megan ------------------------------

## 2011-02-19 ENCOUNTER — Emergency Department: Payer: Self-pay | Admitting: Emergency Medicine

## 2011-03-24 ENCOUNTER — Other Ambulatory Visit: Payer: Self-pay

## 2011-03-24 MED ORDER — FUROSEMIDE 40 MG PO TABS: 40.0000 mg | ORAL_TABLET | Freq: Every day | ORAL | Status: DC | PRN

## 2011-03-24 MED ORDER — CARVEDILOL 12.5 MG PO TABS: 12.5000 mg | ORAL_TABLET | Freq: Two times a day (BID) | ORAL | Status: DC

## 2011-03-27 ENCOUNTER — Other Ambulatory Visit: Payer: Self-pay | Admitting: Emergency Medicine

## 2011-03-27 MED ORDER — FUROSEMIDE 40 MG PO TABS: 40.0000 mg | ORAL_TABLET | Freq: Every day | ORAL | Status: DC | PRN

## 2011-03-27 MED ORDER — CARVEDILOL 12.5 MG PO TABS: 12.5000 mg | ORAL_TABLET | Freq: Two times a day (BID) | ORAL | Status: DC

## 2011-04-04 ENCOUNTER — Ambulatory Visit (INDEPENDENT_AMBULATORY_CARE_PROVIDER_SITE_OTHER): Payer: Medicare PPO | Admitting: Internal Medicine

## 2011-04-04 ENCOUNTER — Encounter: Payer: Self-pay | Admitting: Internal Medicine

## 2011-04-04 DIAGNOSIS — I2510 Atherosclerotic heart disease of native coronary artery without angina pectoris: Secondary | ICD-10-CM

## 2011-04-04 DIAGNOSIS — I5022 Chronic systolic (congestive) heart failure: Secondary | ICD-10-CM

## 2011-04-04 DIAGNOSIS — Z95810 Presence of automatic (implantable) cardiac defibrillator: Secondary | ICD-10-CM

## 2011-04-04 DIAGNOSIS — I428 Other cardiomyopathies: Secondary | ICD-10-CM

## 2011-04-04 NOTE — Assessment & Plan Note (Signed)
Stable on current medications 

## 2011-04-04 NOTE — Assessment & Plan Note (Signed)
Much improved. There has been intercurrent improvement also in left ventricular systolic function

## 2011-04-04 NOTE — Assessment & Plan Note (Signed)
The patient's device was interrogated.  The information was reviewed. No changes were made in the programming.    

## 2011-04-04 NOTE — Progress Notes (Signed)
HPI  Erica [name] is a 77 y.o. female  seen in followup for her CRT-D that was implanted at [facility] for  severe ischemic/nonischemic cardiomyopathy and class 2-3 congestive heart failure with left bundle branch block. The patient's and family are not sure that there was significant benefit associated with the implantation. She has had no known arrhythmias.  The patient denies chest pain, shortness of breath, nocturnal dyspnea, orthopnea or peripheral edema.  There have been no palpitations, lightheadedness or syncope.   she did have a fall across a parking lot barrier associated with facial bone fracture and ecchymosis. She is finally getting better.  Echo 2011 showed mild LV dysfunction with an improvement EF-43% Stress 2011 demonstrated "normal left ventricular function" EF measured at 79%. (?) Catheterization 2008 demonstrated mild in-stent restenosis and 50% mid LAD lesions. Ejection fraction by catheter in 2009 was 20%.  Past Medical History  Diagnosis Date  . Systolic CHF   . Coronary artery disease   . MI (myocardial infarction)   . Cardiomyopathy     nonischemic/ischemic  . Dyslipidemia   . Hypertension   . COPD (chronic obstructive pulmonary disease)   . Breast cancer   . Hypothyroidism   . Pneumonia     Past Surgical History  Procedure Date  . Cardiac catheterization   . Cardiac defibrillator placement   . Insert / replace / remove pacemaker   . Mastectomy   . Abdominal hysterectomy     Current Outpatient Prescriptions  Medication Sig Dispense Refill  . Ascorbic Acid (VITAMIN C) 500 MG tablet Take 500 mg by mouth daily.        Marland Kitchen aspirin 81 MG EC tablet Take 81 mg by mouth daily.        . Calcium Carbonate-Vitamin D (CALCIUM + D) 600-200 MG-UNIT TABS Take 1 tablet by mouth daily.        . carvedilol (COREG) 12.5 MG tablet Take 1 tablet (12.5 mg total) by mouth 2 (two) times daily with a meal.  180 tablet  3  . clopidogrel (PLAVIX) 75 MG tablet Take 75 mg by mouth  daily.        . ferrous sulfate 325 (65 FE) MG tablet Take 325 mg by mouth daily with breakfast.        . furosemide (LASIX) 40 MG tablet Take 1 tablet (40 mg total) by mouth daily as needed.  90 tablet  3  . levothyroxine (SYNTHROID, LEVOTHROID) 50 MCG tablet Take 50 mcg by mouth daily.        Marland Kitchen LORazepam (ATIVAN) 1 MG tablet Take 1 mg by mouth at bedtime as needed.        Marland Kitchen losartan (COZAAR) 50 MG tablet Take 50 mg by mouth daily.       Marland Kitchen omeprazole (PRILOSEC) 20 MG capsule Take 20 mg by mouth daily as needed.        . simvastatin (ZOCOR) 20 MG tablet Take 10 mg by mouth at bedtime.        . vitamin B-12 (CYANOCOBALAMIN) 1000 MCG tablet Take 1,000 mcg by mouth daily.        Marland Kitchen DISCONTD: cefUROXime (CEFTIN) 500 MG tablet Take 500 mg by mouth 2 (two) times daily.        . Fluticasone-Salmeterol (ADVAIR DISKUS) 100-50 MCG/DOSE AEPB Inhale 2 puffs into the lungs every 12 (twelve) hours.          Allergies  Allergen Reactions  . Ace Inhibitors   . Caduet   .  Ciprofloxacin   . Quinolones     Review of Systems negative except from HPI and PMH  Physical Exam Well developed and well nourished in no acute distress HENT normal E scleral and icterus clear There is some residual facial ecchymoses Neck Supple JVP flat; carotids brisk and full Clear to ausculation Regular rate and rhythm,with an S4-1 widely split S2 and a 2/6 systolic murmur along the right sternal border Soft with active bowel sounds No clubbing cyanosis and edema Alert and oriented, grossly normal motor and sensory function Skin Warm and Dry  Assessment and  Plan

## 2011-04-04 NOTE — Patient Instructions (Signed)
Gunnar Fusi will be in contact with you for your Care[facility] transmission. Your physician recommends that you schedule a follow-up appointment in: 1 year with Dr. Graciela Husbands

## 2011-06-02 ENCOUNTER — Other Ambulatory Visit: Payer: Self-pay | Admitting: *Deleted

## 2011-06-02 MED ORDER — CLOPIDOGREL BISULFATE 75 MG PO TABS: 75.0000 mg | ORAL_TABLET | Freq: Every day | ORAL | Status: DC

## 2011-07-01 ENCOUNTER — Telehealth: Payer: Self-pay | Admitting: Cardiovascular Disease

## 2011-07-01 NOTE — Telephone Encounter (Signed)
Patient is having a procedure that scraps her skin for a basil cell removal on her breast.  Does the patient need to come off of Plavix?

## 2011-07-01 NOTE — Telephone Encounter (Signed)
Dr. Mariah Milling, please advise.

## 2011-07-02 NOTE — Telephone Encounter (Signed)
If she is concerned about bleeding risk, she can stop the plavix a few days before and restart once procedure is done

## 2011-07-03 ENCOUNTER — Other Ambulatory Visit: Payer: Self-pay | Admitting: Internal Medicine

## 2011-07-03 ENCOUNTER — Encounter: Payer: Self-pay | Admitting: Internal Medicine

## 2011-07-03 ENCOUNTER — Ambulatory Visit (INDEPENDENT_AMBULATORY_CARE_PROVIDER_SITE_OTHER): Payer: Medicare PPO | Admitting: *Deleted

## 2011-07-03 DIAGNOSIS — Z95810 Presence of automatic (implantable) cardiac defibrillator: Secondary | ICD-10-CM

## 2011-07-03 DIAGNOSIS — I509 Heart failure, unspecified: Secondary | ICD-10-CM

## 2011-07-03 DIAGNOSIS — I428 Other cardiomyopathies: Secondary | ICD-10-CM

## 2011-07-03 DIAGNOSIS — I5032 Chronic diastolic (congestive) heart failure: Secondary | ICD-10-CM

## 2011-07-03 NOTE — Telephone Encounter (Signed)
Pt.notified

## 2011-07-04 LAB — REMOTE ICD DEVICE
AL AMPLITUDE: 2.1 mv
AL THRESHOLD: 0.625 V
BAMS-0001: 170 {beats}/min
LV LEAD IMPEDENCE ICD: 893 Ohm
LV LEAD THRESHOLD: 1 V
PACEART VT: 0
TOT-0001: 1
TOT-0002: 0
TOT-0006: 20100115000000
TZAT-0001ATACH: 1
TZAT-0001ATACH: 3
TZAT-0001FASTVT: 1
TZAT-0001SLOWVT: 1
TZAT-0002ATACH: NEGATIVE
TZAT-0004SLOWVT: 8
TZAT-0005SLOWVT: 88 pct
TZAT-0011SLOWVT: 10 ms
TZAT-0012ATACH: 150 ms
TZAT-0012FASTVT: 200 ms
TZAT-0012SLOWVT: 200 ms
TZAT-0013SLOWVT: 2
TZAT-0018ATACH: NEGATIVE
TZAT-0018ATACH: NEGATIVE
TZAT-0019ATACH: 6 V
TZAT-0019SLOWVT: 8 V
TZAT-0020ATACH: 1.5 ms
TZAT-0020FASTVT: 1.5 ms
TZAT-0020SLOWVT: 1.5 ms
TZON-0003SLOWVT: 330 ms
TZON-0003VSLOWVT: 370 ms
TZST-0001ATACH: 6
TZST-0001FASTVT: 2
TZST-0001FASTVT: 3
TZST-0001FASTVT: 4
TZST-0001FASTVT: 5
TZST-0001SLOWVT: 2
TZST-0001SLOWVT: 3
TZST-0001SLOWVT: 5
TZST-0001SLOWVT: 6
TZST-0002ATACH: NEGATIVE
TZST-0002ATACH: NEGATIVE
TZST-0002FASTVT: NEGATIVE
TZST-0002FASTVT: NEGATIVE
TZST-0002FASTVT: NEGATIVE
TZST-0002FASTVT: NEGATIVE
TZST-0003SLOWVT: 14 J
TZST-0003SLOWVT: 35 J
TZST-0003SLOWVT: 35 J
TZST-0003SLOWVT: 35 J
VF: 0

## 2011-07-11 NOTE — Progress Notes (Signed)
Icd remote check w/icm

## 2011-07-16 ENCOUNTER — Encounter: Payer: Self-pay | Admitting: *Deleted

## 2011-08-07 ENCOUNTER — Encounter: Payer: Self-pay | Admitting: Cardiovascular Disease

## 2011-08-08 ENCOUNTER — Ambulatory Visit (INDEPENDENT_AMBULATORY_CARE_PROVIDER_SITE_OTHER): Payer: Medicare PPO | Admitting: Cardiovascular Disease

## 2011-08-08 ENCOUNTER — Encounter: Payer: Self-pay | Admitting: Cardiovascular Disease

## 2011-08-08 DIAGNOSIS — I10 Essential (primary) hypertension: Secondary | ICD-10-CM

## 2011-08-08 DIAGNOSIS — Z95810 Presence of automatic (implantable) cardiac defibrillator: Secondary | ICD-10-CM

## 2011-08-08 DIAGNOSIS — I5032 Chronic diastolic (congestive) heart failure: Secondary | ICD-10-CM

## 2011-08-08 DIAGNOSIS — E782 Mixed hyperlipidemia: Secondary | ICD-10-CM

## 2011-08-08 DIAGNOSIS — I2510 Atherosclerotic heart disease of native coronary artery without angina pectoris: Secondary | ICD-10-CM

## 2011-08-08 DIAGNOSIS — I509 Heart failure, unspecified: Secondary | ICD-10-CM

## 2011-08-08 NOTE — Progress Notes (Signed)
Patient ID: Erica [name], female    DOB: 25-Feb-1931, 77 y.o.   MRN: [id number]  HPI Comments: Erica [name] is a 77 year old woman with nonischemic cardiomyopathy CRT-D that was implanted at [facility]  with class 2-3 congestive heart failure with left bundle branch block, coronary artery disease with stenting to her RCA, hyperlipidemia, hypertension who presents for routine follow up. Previous history of bronchitis. She reports her husband has underlying dementia and requires a high level of care.   Overall, she reports that she is doing well. She denies any significant shortness of breath. She does take Lasix every other week when she feels bloated in her abdomen. Otherwise she denies any shortness of breath or edema and has good energy. She does have some fatigue though does have poor sleep as her husband is sleeping in the daytime and awakened at night. She has not been exercising as she usually does because she is taking care of him. Earlier in the year, she did have surgery for skin resection off the right breast.   Echocardiogram April 2011 showing mild LV systolic dysfunction, ejection fraction 43%, mildly dilated left atrium, mild pulmonary hypertension with right ventricular systolic pressure of 33 Stress test April 2011 showing normal study with normal ejection fraction. Ejection fraction estimated at 79%. This was a lexiscan.  cardiac catheterization in January 2008 showing 40% restenosis of mid RCA stent and 50% mid LAD disease. Notes indicate 2 stents in her RCA. Catheterization October 2009 showing 40-50% LAD disease, 40% RCA disease, ejection fraction less than 20%.  CT scan report with concerns regarding subclavian stenosis   EKG shows paced rhythm at 79 beats per minute Cholesterol from January shows LDL 112, total cholesterol 184, HDL 49     Outpatient Encounter Prescriptions as of 08/08/2011  Medication Sig Dispense Refill  . Ascorbic Acid (VITAMIN C) 500 MG tablet Take 500 mg by mouth  daily.        Marland Kitchen aspirin 81 MG EC tablet Take 81 mg by mouth daily.        . Calcium Carbonate-Vitamin D (CALCIUM + D) 600-200 MG-UNIT TABS Take 1 tablet by mouth daily.        . carvedilol (COREG) 12.5 MG tablet Take 1 tablet (12.5 mg total) by mouth 2 (two) times daily with a meal.  180 tablet  3  . clopidogrel (PLAVIX) 75 MG tablet Take 1 tablet (75 mg total) by mouth daily.  30 tablet  3  . ferrous sulfate 325 (65 FE) MG tablet Take 325 mg by mouth daily with breakfast.        . furosemide (LASIX) 40 MG tablet Take 1 tablet (40 mg total) by mouth daily as needed.  90 tablet  3  . levothyroxine (SYNTHROID, LEVOTHROID) 50 MCG tablet Take 50 mcg by mouth daily.        Marland Kitchen LORazepam (ATIVAN) 1 MG tablet Take 1 mg by mouth at bedtime as needed.        Marland Kitchen losartan (COZAAR) 50 MG tablet Take 50 mg by mouth daily.       Marland Kitchen omeprazole (PRILOSEC) 20 MG capsule Take 20 mg by mouth daily as needed.        . simvastatin (ZOCOR) 20 MG tablet Take 10 mg by mouth at bedtime.        . vitamin B-12 (CYANOCOBALAMIN) 1000 MCG tablet Take 1,000 mcg by mouth daily.        Marland Kitchen DISCONTD: Fluticasone-Salmeterol (ADVAIR DISKUS) 100-50 MCG/DOSE AEPB  Inhale 2 puffs into the lungs every 12 (twelve) hours.            Review of Systems  Constitutional: Positive for fatigue.  HENT: Negative.   Eyes: Negative.   Respiratory: Negative.   Cardiovascular: Negative.   Gastrointestinal: Negative.   Musculoskeletal: Negative.   Skin: Negative.   Neurological: Negative.   Hematological: Negative.   Psychiatric/Behavioral: Negative.   All other systems reviewed and are negative.    BP 131/77  Pulse 82  Ht 5\' 2"  (1.575 m)  Wt 162 lb (73.483 kg)  BMI 29.63 kg/m2  Physical Exam  Nursing note and vitals reviewed. Constitutional: She is oriented to person, place, and time. She appears well-developed and well-nourished.  HENT:  Head: Normocephalic.  Nose: Nose normal.  Mouth/Throat: Oropharynx is clear and moist.    Eyes: Conjunctivae are normal. Pupils are equal, round, and reactive to light.  Neck: Normal range of motion. Neck supple. No JVD present.  Cardiovascular: Normal rate, regular rhythm, S1 normal, S2 normal, normal heart sounds and intact distal pulses.  Exam reveals no gallop and no friction rub.   No murmur heard. Pulmonary/Chest: Effort normal and breath sounds normal. No respiratory distress. She has no wheezes. She has no rales. She exhibits no tenderness.  Abdominal: Soft. Bowel sounds are normal. She exhibits no distension. There is no tenderness.  Musculoskeletal: Normal range of motion. She exhibits no edema and no tenderness.  Lymphadenopathy:    She has no cervical adenopathy.  Neurological: She is alert and oriented to person, place, and time. Coordination normal.  Skin: Skin is warm and dry. No rash noted. No erythema.  Psychiatric: She has a normal mood and affect. Her behavior is normal. Judgment and thought content normal.         Assessment and Plan

## 2011-08-08 NOTE — Assessment & Plan Note (Signed)
Cholesterol is at goal on the current lipid regimen. No changes to the medications were made.  

## 2011-08-08 NOTE — Assessment & Plan Note (Signed)
Currently with no symptoms of angina. No further workup at this time. Continue current medication regimen. 

## 2011-08-08 NOTE — Assessment & Plan Note (Signed)
History of mildly depressed systolic function that has improved since 2009. No signs of heart failure at this time. She takes Lasix as needed. She is unable to tolerate higher doses of carvedilol.

## 2011-08-08 NOTE — Patient Instructions (Signed)
You are doing well. No medication changes were made. Please call us if you have new issues that need to be addressed before your next appt.  We will call you for a follow up Appt. In 6 months  

## 2011-08-08 NOTE — Assessment & Plan Note (Signed)
ICD followed by [facility].

## 2011-08-08 NOTE — Assessment & Plan Note (Signed)
Blood pressure is well controlled on today's visit. No changes made to the medications. 

## 2011-10-08 ENCOUNTER — Other Ambulatory Visit: Payer: Self-pay | Admitting: Internal Medicine

## 2011-10-10 ENCOUNTER — Other Ambulatory Visit: Payer: Self-pay | Admitting: Internal Medicine

## 2011-10-10 ENCOUNTER — Ambulatory Visit (INDEPENDENT_AMBULATORY_CARE_PROVIDER_SITE_OTHER): Payer: Medicare PPO | Admitting: *Deleted

## 2011-10-10 ENCOUNTER — Encounter: Payer: Self-pay | Admitting: Internal Medicine

## 2011-10-10 DIAGNOSIS — I5032 Chronic diastolic (congestive) heart failure: Secondary | ICD-10-CM

## 2011-10-10 DIAGNOSIS — I509 Heart failure, unspecified: Secondary | ICD-10-CM

## 2011-10-10 DIAGNOSIS — Z95810 Presence of automatic (implantable) cardiac defibrillator: Secondary | ICD-10-CM

## 2011-10-10 DIAGNOSIS — I428 Other cardiomyopathies: Secondary | ICD-10-CM

## 2011-10-10 LAB — REMOTE ICD DEVICE
AL AMPLITUDE: 1.625 mv
AL IMPEDENCE ICD: 380 Ohm
BAMS-0001: 170 {beats}/min
CHARGE TIME: 9.929 s
FVT: 0
LV LEAD IMPEDENCE ICD: 855 Ohm
LV LEAD THRESHOLD: 0.875 V
PACEART VT: 0
RV LEAD AMPLITUDE: 11.625 mv
RV LEAD THRESHOLD: 0.75 V
TOT-0001: 1
TOT-0002: 0
TOT-0006: 20100115000000
TZAT-0002ATACH: NEGATIVE
TZAT-0002ATACH: NEGATIVE
TZAT-0002FASTVT: NEGATIVE
TZAT-0012ATACH: 150 ms
TZAT-0012FASTVT: 200 ms
TZAT-0012SLOWVT: 200 ms
TZAT-0013SLOWVT: 2
TZAT-0018ATACH: NEGATIVE
TZAT-0019ATACH: 6 V
TZAT-0019ATACH: 6 V
TZAT-0019ATACH: 6 V
TZAT-0019SLOWVT: 8 V
TZAT-0020ATACH: 1.5 ms
TZAT-0020ATACH: 1.5 ms
TZAT-0020FASTVT: 1.5 ms
TZAT-0020SLOWVT: 1.5 ms
TZON-0003SLOWVT: 330 ms
TZON-0004SLOWVT: 16
TZON-0004VSLOWVT: 20
TZST-0001ATACH: 4
TZST-0001ATACH: 5
TZST-0001ATACH: 6
TZST-0001FASTVT: 4
TZST-0001FASTVT: 5
TZST-0001FASTVT: 6
TZST-0001SLOWVT: 2
TZST-0001SLOWVT: 3
TZST-0001SLOWVT: 6
TZST-0002ATACH: NEGATIVE
TZST-0002ATACH: NEGATIVE
TZST-0002FASTVT: NEGATIVE
TZST-0002FASTVT: NEGATIVE
TZST-0002FASTVT: NEGATIVE
TZST-0002FASTVT: NEGATIVE
TZST-0003SLOWVT: 14 J
TZST-0003SLOWVT: 35 J

## 2011-10-14 NOTE — Progress Notes (Signed)
Remote icd w/icm  

## 2011-10-24 ENCOUNTER — Encounter: Payer: Self-pay | Admitting: *Deleted

## 2011-11-19 ENCOUNTER — Other Ambulatory Visit: Payer: Self-pay | Admitting: Family Medicine

## 2011-11-19 DIAGNOSIS — Z1231 Encounter for screening mammogram for malignant neoplasm of breast: Secondary | ICD-10-CM

## 2011-12-17 ENCOUNTER — Telehealth: Payer: Self-pay

## 2011-12-17 MED ORDER — CARVEDILOL 12.5 MG PO TABS: 12.5000 mg | ORAL_TABLET | Freq: Two times a day (BID) | ORAL | Status: DC

## 2011-12-17 NOTE — Telephone Encounter (Signed)
Refill sent for carvedilol 12.5 mg  

## 2011-12-22 ENCOUNTER — Other Ambulatory Visit: Payer: Self-pay | Admitting: *Deleted

## 2011-12-22 MED ORDER — SIMVASTATIN 20 MG PO TABS: 10.0000 mg | ORAL_TABLET | Freq: Every day | ORAL | Status: DC

## 2011-12-23 ENCOUNTER — Ambulatory Visit
Admission: RE | Admit: 2011-12-23 | Discharge: 2011-12-23 | Disposition: A | Discharge status: Home / Self Care | Payer: BLUE CROSS/BLUE SHIELD | Source: Ambulatory Visit | Attending: Family Medicine | Admitting: Family Medicine

## 2011-12-23 DIAGNOSIS — Z1231 Encounter for screening mammogram for malignant neoplasm of breast: Secondary | ICD-10-CM

## 2012-01-08 ENCOUNTER — Ambulatory Visit (INDEPENDENT_AMBULATORY_CARE_PROVIDER_SITE_OTHER): Payer: BLUE CROSS/BLUE SHIELD | Admitting: *Deleted

## 2012-01-08 ENCOUNTER — Encounter: Payer: Self-pay | Admitting: Internal Medicine

## 2012-01-08 DIAGNOSIS — I509 Heart failure, unspecified: Secondary | ICD-10-CM

## 2012-01-08 DIAGNOSIS — Z95810 Presence of automatic (implantable) cardiac defibrillator: Secondary | ICD-10-CM

## 2012-01-08 DIAGNOSIS — I5032 Chronic diastolic (congestive) heart failure: Secondary | ICD-10-CM

## 2012-01-08 DIAGNOSIS — I428 Other cardiomyopathies: Secondary | ICD-10-CM

## 2012-01-12 LAB — REMOTE ICD DEVICE
AL AMPLITUDE: 1.8 mv
BAMS-0001: 170 {beats}/min
BATTERY VOLTAGE: 3.0281 V
CHARGE TIME: 10.099 s
FVT: 0
RV LEAD AMPLITUDE: 10.6 mv
RV LEAD IMPEDENCE ICD: 779 Ohm
TOT-0001: 1
TZAT-0001ATACH: 1
TZAT-0001ATACH: 2
TZAT-0001ATACH: 3
TZAT-0002ATACH: NEGATIVE
TZAT-0005SLOWVT: 88 pct
TZAT-0011SLOWVT: 10 ms
TZAT-0012ATACH: 150 ms
TZAT-0012FASTVT: 200 ms
TZAT-0018ATACH: NEGATIVE
TZAT-0018ATACH: NEGATIVE
TZAT-0018ATACH: NEGATIVE
TZAT-0019ATACH: 6 V
TZAT-0019FASTVT: 8 V
TZAT-0020ATACH: 1.5 ms
TZAT-0020FASTVT: 1.5 ms
TZON-0003VSLOWVT: 370 ms
TZON-0004VSLOWVT: 20
TZST-0001ATACH: 5
TZST-0001ATACH: 6
TZST-0001FASTVT: 2
TZST-0001FASTVT: 3
TZST-0001FASTVT: 5
TZST-0001SLOWVT: 3
TZST-0001SLOWVT: 5
TZST-0001SLOWVT: 6
TZST-0002ATACH: NEGATIVE
TZST-0002ATACH: NEGATIVE
TZST-0002ATACH: NEGATIVE
TZST-0002FASTVT: NEGATIVE
TZST-0002FASTVT: NEGATIVE
TZST-0003SLOWVT: 14 J
TZST-0003SLOWVT: 35 J

## 2012-01-14 ENCOUNTER — Encounter: Payer: Self-pay | Admitting: *Deleted

## 2012-01-15 NOTE — Progress Notes (Signed)
Remote icd check w/icm  

## 2012-04-06 ENCOUNTER — Encounter: Payer: Self-pay | Admitting: Internal Medicine

## 2012-04-06 ENCOUNTER — Ambulatory Visit (INDEPENDENT_AMBULATORY_CARE_PROVIDER_SITE_OTHER): Payer: Medicare Other | Admitting: Internal Medicine

## 2012-04-06 VITALS — BP 109/69 | HR 84 | Ht 62.0 in | Wt 158.0 lb

## 2012-04-06 DIAGNOSIS — I509 Heart failure, unspecified: Secondary | ICD-10-CM

## 2012-04-06 DIAGNOSIS — I5032 Chronic diastolic (congestive) heart failure: Secondary | ICD-10-CM

## 2012-04-06 DIAGNOSIS — Z95810 Presence of automatic (implantable) cardiac defibrillator: Secondary | ICD-10-CM

## 2012-04-06 DIAGNOSIS — I951 Orthostatic hypotension: Secondary | ICD-10-CM | POA: Insufficient documentation

## 2012-04-06 DIAGNOSIS — I10 Essential (primary) hypertension: Secondary | ICD-10-CM

## 2012-04-06 DIAGNOSIS — R0602 Shortness of breath: Secondary | ICD-10-CM

## 2012-04-06 LAB — ICD DEVICE OBSERVATION
AL AMPLITUDE: 2.375 mv
AL IMPEDENCE ICD: 380 Ohm
ATRIAL PACING ICD: 0.03 pct
CHARGE TIME: 10.099 s
LV LEAD IMPEDENCE ICD: 893 Ohm
RV LEAD IMPEDENCE ICD: 741 Ohm
TOT-0001: 1
TOT-0006: 20100115000000
TZAT-0001ATACH: 1
TZAT-0001ATACH: 2
TZAT-0001ATACH: 3
TZAT-0002ATACH: NEGATIVE
TZAT-0002ATACH: NEGATIVE
TZAT-0002FASTVT: NEGATIVE
TZAT-0005SLOWVT: 88 pct
TZAT-0011SLOWVT: 10 ms
TZAT-0012ATACH: 150 ms
TZAT-0012ATACH: 150 ms
TZAT-0012ATACH: 150 ms
TZAT-0013SLOWVT: 2
TZAT-0018ATACH: NEGATIVE
TZAT-0018ATACH: NEGATIVE
TZAT-0019ATACH: 6 V
TZAT-0019SLOWVT: 8 V
TZAT-0020ATACH: 1.5 ms
TZAT-0020FASTVT: 1.5 ms
TZON-0003ATACH: 350 ms
TZON-0003VSLOWVT: 370 ms
TZON-0004SLOWVT: 16
TZON-0004VSLOWVT: 20
TZON-0005SLOWVT: 12
TZST-0001ATACH: 4
TZST-0001ATACH: 6
TZST-0001FASTVT: 3
TZST-0001FASTVT: 5
TZST-0001SLOWVT: 3
TZST-0001SLOWVT: 5
TZST-0001SLOWVT: 6
TZST-0002ATACH: NEGATIVE
TZST-0002FASTVT: NEGATIVE
TZST-0002FASTVT: NEGATIVE
TZST-0002FASTVT: NEGATIVE
TZST-0003SLOWVT: 25 J
TZST-0003SLOWVT: 35 J
VENTRICULAR PACING ICD: 99.94 pct

## 2012-04-06 NOTE — Assessment & Plan Note (Signed)
As above.

## 2012-04-06 NOTE — Progress Notes (Signed)
HPI  Erica [name] is a 78 y.o. female  seen in followup for her CRT-D that was implanted at [facility] for  severe ischemic/nonischemic cardiomyopathy and class 2-3 congestive heart failure with left bundle branch block. The patient's and family are not sure that there was significant benefit associated with the implantation. She has had no known arrhythmias.  Echo 2011 showed mild LV dysfunction with an improvement EF-43% Stress 2011 demonstrated "normal left ventricular function" EF measured at 79%.   Her husband died 03-May-2023. He had been ill for a long time.  She knows to no significant complaints the first is exercise intolerance noted primarily on walking up inclines. She notes also that she is not on a diuretic.  She also has orthostatic intolerance  Past Medical History  Diagnosis Date  . Systolic CHF   . Coronary artery disease   . MI (myocardial infarction)   . Cardiomyopathy     nonischemic/ischemic  . Dyslipidemia   . Hypertension   . COPD (chronic obstructive pulmonary disease)   . Breast cancer   . Hypothyroidism   . Pneumonia   . Basal cell carcinoma 2011    Past Surgical History  Procedure Date  . Cardiac catheterization   . Cardiac defibrillator placement   . Insert / replace / remove pacemaker   . Mastectomy   . Abdominal hysterectomy   . Basal cell carcinoma excision     Monh's procedure    Current Outpatient Prescriptions  Medication Sig Dispense Refill  . Ascorbic Acid (VITAMIN C) 500 MG tablet Take 500 mg by mouth daily.        Marland Kitchen aspirin 81 MG EC tablet Take 81 mg by mouth daily.        . Calcium Carbonate-Vitamin D (CALCIUM + D) 600-200 MG-UNIT TABS Take 1 tablet by mouth daily.        . carvedilol (COREG) 12.5 MG tablet Take 1 tablet (12.5 mg total) by mouth 2 (two) times daily with a meal.  180 tablet  3  . clopidogrel (PLAVIX) 75 MG tablet TAKE 1 TABLET (75 MG TOTAL) BY MOUTH DAILY.  30 tablet  11  . ferrous sulfate 325 (65 FE) MG tablet Take 325 mg  by mouth daily with breakfast.        . furosemide (LASIX) 40 MG tablet Take 1 tablet (40 mg total) by mouth daily as needed.  90 tablet  3  . levothyroxine (SYNTHROID, LEVOTHROID) 50 MCG tablet Take 50 mcg by mouth daily.        Marland Kitchen LORazepam (ATIVAN) 1 MG tablet Take 1 mg by mouth at bedtime as needed.        . simvastatin (ZOCOR) 20 MG tablet Take 0.5 tablets (10 mg total) by mouth at bedtime.  30 tablet  2  . vitamin B-12 (CYANOCOBALAMIN) 1000 MCG tablet Take 1,000 mcg by mouth daily.          Allergies  Allergen Reactions  . Ace Inhibitors   . Amlodipine-Atorvastatin   . Ciprofloxacin   . Quinolones     Review of Systems negative except from HPI and PMH  Physical Exam BP 109/69  Pulse 84  Ht 5\' 2"  (1.575 m)  Wt 158 lb (71.668 kg)  BMI 28.90 kg/m2 Well developed and well nourished in no acute distress HENT normal E scleral and icterus clear Neck Supple JVP flat; carotids brisk and full Clear to ausculation Regular rate and rhythm, no murmurs gallops or rub Soft with active bowel  sounds No clubbing cyanosis Trace Edema Alert and oriented, grossly normal motor and sensory function Skin Warm and Dry    Assessment and  Plan

## 2012-04-06 NOTE — Assessment & Plan Note (Signed)
The patient's device was interrogated.  The information was reviewed. No changes were made in the programming.    

## 2012-04-06 NOTE — Assessment & Plan Note (Signed)
We are presuming that her ejection fraction remains preserved. I will plan to echo her when we see her again in a week's time if her symptoms have not abated. I am reluctant to use diuretics because of the problems noted above.

## 2012-04-06 NOTE — Patient Instructions (Signed)
Your physician wants you to follow-up in: 6-8 weeks with Dr. Klein. You will receive a reminder letter in the mail two months in advance. If you don't receive a letter, please call our office to schedule the follow-up appointment.   

## 2012-04-06 NOTE — Assessment & Plan Note (Addendum)
Patient has significant orthostatic intolerance associated with a 60 mm blood pressure drop. This is symptomatic. SHe has significant supine hypertension as noted above; treatment options would include compression stockings which will be tedious and hot, ProAmatine about which I worry a little as her sitting blood pressure was also 170, and Mestinon which is probably the best choice. We have reviewed maneuvers and we will see how she does. I have cautioned her about not using her diuretics to aggressively.

## 2012-05-18 ENCOUNTER — Encounter: Payer: Self-pay | Admitting: Internal Medicine

## 2012-05-18 ENCOUNTER — Ambulatory Visit (INDEPENDENT_AMBULATORY_CARE_PROVIDER_SITE_OTHER): Payer: Medicare Other | Admitting: Internal Medicine

## 2012-05-18 VITALS — BP 118/72 | HR 59 | Ht 63.0 in | Wt 156.2 lb

## 2012-05-18 DIAGNOSIS — I10 Essential (primary) hypertension: Secondary | ICD-10-CM

## 2012-05-18 DIAGNOSIS — I951 Orthostatic hypotension: Secondary | ICD-10-CM

## 2012-05-18 MED ORDER — PYRIDOSTIGMINE BROMIDE 60 MG PO TABS: ORAL_TABLET | ORAL | Status: DC

## 2012-05-18 MED ORDER — MIDODRINE HCL 5 MG PO TABS: 5.0000 mg | ORAL_TABLET | Freq: Two times a day (BID) | ORAL | Status: AC

## 2012-05-18 NOTE — Progress Notes (Signed)
HPI  Erica [name] is a 78 y.o. female seen in followup for her CRT-D that was implanted at [facility] for severe ischemic/nonischemic cardiomyopathy and class 2-3 congestive heart failure with left bundle branch block. The patient's and family are not sure that there was significant benefit associated with the implantation. She has had no known arrhythmias.  Echo 2011 showed mild LV dysfunction with an improvement EF-43%  Stress 2011 demonstrated "normal left ventricular function" EF measured at 79%.   She has had significant problems with orthostatic intolerance.  Her husband had just died. We elected to wait and see how she did. She comes back in today continuing to struggle with orthostatic lightheadedness.   Past Medical History  Diagnosis Date  . Systolic CHF   . Coronary artery disease   . MI (myocardial infarction)   . Cardiomyopathy     nonischemic/ischemic  . Dyslipidemia   . Hypertension   . COPD (chronic obstructive pulmonary disease)   . Breast cancer   . Hypothyroidism   . Pneumonia   . Basal cell carcinoma 2011    Past Surgical History  Procedure Date  . Cardiac catheterization   . Cardiac defibrillator placement   . Insert / replace / remove pacemaker   . Mastectomy   . Abdominal hysterectomy   . Basal cell carcinoma excision     Monh's procedure    Current Outpatient Prescriptions  Medication Sig Dispense Refill  . Ascorbic Acid (VITAMIN C) 500 MG tablet Take 500 mg by mouth daily.        Marland Kitchen aspirin 81 MG EC tablet Take 81 mg by mouth daily.        . Calcium Carbonate-Vitamin D (CALCIUM + D) 600-200 MG-UNIT TABS Take 1 tablet by mouth daily.        . carvedilol (COREG) 12.5 MG tablet Take 1 tablet (12.5 mg total) by mouth 2 (two) times daily with a meal.  180 tablet  3  . clopidogrel (PLAVIX) 75 MG tablet TAKE 1 TABLET (75 MG TOTAL) BY MOUTH DAILY.  30 tablet  11  . ferrous sulfate 324 (65 FE) MG TBEC Take 324 mg by mouth daily as needed.      . furosemide  (LASIX) 40 MG tablet Take 1 tablet (40 mg total) by mouth daily as needed.  90 tablet  3  . levothyroxine (SYNTHROID, LEVOTHROID) 75 MCG tablet Take 75 mcg by mouth daily.      Marland Kitchen LORazepam (ATIVAN) 1 MG tablet Take 1 mg by mouth at bedtime as needed.        Marland Kitchen losartan (COZAAR) 50 MG tablet Take 50 mg by mouth daily.      . simvastatin (ZOCOR) 20 MG tablet Take 0.5 tablets (10 mg total) by mouth at bedtime.  30 tablet  2  . vitamin B-12 (CYANOCOBALAMIN) 1000 MCG tablet Take 1,000 mcg by mouth daily.          Allergies  Allergen Reactions  . Ace Inhibitors   . Amlodipine-Atorvastatin   . Ciprofloxacin   . Quinolones     Review of Systems negative except from HPI and PMH  Physical Exam BP 118/72  Pulse 59  Ht 5\' 3"  (1.6 m)  Wt 156 lb 4 oz (70.875 kg)  BMI 27.68 kg/m2 Well developed and nourished in no acute distress HENT normal Neck supple with JVP-flat Clear Regular rate and rhythm, no murmurs or gallops Abd-soft with active BS No Clubbing cyanosis edema Skin-warm and dry A & Oriented  Grossly normal sensory and motor function  Skin Warm and Dry    Assessment and  Plan

## 2012-05-18 NOTE — Assessment & Plan Note (Signed)
Because of ongoing hypertension, we will continue to use Cozaar

## 2012-05-18 NOTE — Patient Instructions (Addendum)
Your physician wants you to follow-up in: 6 weeks with Dr. Graciela Husbands. You will receive a reminder letter in the mail two months in advance. If you don't receive a letter, please call our office to schedule the follow-up appointment.  Your physician has recommended you make the following change in your medication: Try either of these medications, based on what you find out at Pharmacy re: cost. 1)Proamatine 5 mg PO twice daily (#10 tabs) (#30 tabs) 2)Mestinon 30 mg twice daily (#10 tabs) (#30 tabs)

## 2012-05-18 NOTE — Assessment & Plan Note (Signed)
We will try a medicinal approach to orthostatic intolerance. Her questions regarding cost so will give prescription for both Mestinon and ProAmatine and the family will check and she is cheaper of the  2.   We will see her in 6 weeks

## 2012-06-15 ENCOUNTER — Other Ambulatory Visit: Payer: Self-pay | Admitting: Internal Medicine

## 2012-06-16 ENCOUNTER — Other Ambulatory Visit: Payer: Self-pay

## 2012-06-16 MED ORDER — SIMVASTATIN 20 MG PO TABS: 10.0000 mg | ORAL_TABLET | Freq: Every day | ORAL | Status: DC

## 2012-06-16 NOTE — Telephone Encounter (Signed)
Refill sent for simvastatin 20 mg take 1/2 tablet daily.

## 2012-07-08 ENCOUNTER — Ambulatory Visit (INDEPENDENT_AMBULATORY_CARE_PROVIDER_SITE_OTHER): Payer: Medicare Other | Admitting: *Deleted

## 2012-07-08 ENCOUNTER — Encounter: Payer: Self-pay | Admitting: Internal Medicine

## 2012-07-08 DIAGNOSIS — I5032 Chronic diastolic (congestive) heart failure: Secondary | ICD-10-CM

## 2012-07-08 DIAGNOSIS — Z95810 Presence of automatic (implantable) cardiac defibrillator: Secondary | ICD-10-CM

## 2012-07-09 LAB — REMOTE ICD DEVICE
AL AMPLITUDE: 2.1 mv
AL IMPEDENCE ICD: 380 Ohm
CHARGE TIME: 10.229 s
LV LEAD IMPEDENCE ICD: 950 Ohm
RV LEAD AMPLITUDE: 10.4 mv
RV LEAD IMPEDENCE ICD: 741 Ohm
TOT-0001: 1
TOT-0002: 0
TOT-0006: 20100115000000
TZAT-0001ATACH: 1
TZAT-0001ATACH: 2
TZAT-0001FASTVT: 1
TZAT-0002ATACH: NEGATIVE
TZAT-0002ATACH: NEGATIVE
TZAT-0002ATACH: NEGATIVE
TZAT-0011SLOWVT: 10 ms
TZAT-0012FASTVT: 200 ms
TZAT-0012SLOWVT: 200 ms
TZAT-0018ATACH: NEGATIVE
TZAT-0018ATACH: NEGATIVE
TZAT-0018ATACH: NEGATIVE
TZAT-0019ATACH: 6 V
TZAT-0019ATACH: 6 V
TZAT-0019SLOWVT: 8 V
TZAT-0020ATACH: 1.5 ms
TZAT-0020SLOWVT: 1.5 ms
TZON-0003ATACH: 350 ms
TZON-0003SLOWVT: 330 ms
TZON-0004VSLOWVT: 20
TZON-0005SLOWVT: 12
TZST-0001FASTVT: 3
TZST-0001FASTVT: 4
TZST-0001FASTVT: 5
TZST-0001SLOWVT: 3
TZST-0001SLOWVT: 4
TZST-0001SLOWVT: 6
TZST-0002ATACH: NEGATIVE
TZST-0002ATACH: NEGATIVE
TZST-0002FASTVT: NEGATIVE
TZST-0002FASTVT: NEGATIVE
TZST-0003SLOWVT: 14 J
TZST-0003SLOWVT: 35 J

## 2012-07-27 ENCOUNTER — Ambulatory Visit (INDEPENDENT_AMBULATORY_CARE_PROVIDER_SITE_OTHER): Payer: Medicare Other | Admitting: Cardiovascular Disease

## 2012-07-27 ENCOUNTER — Encounter: Payer: Self-pay | Admitting: Cardiovascular Disease

## 2012-07-27 VITALS — BP 110/62 | HR 81 | Ht 62.0 in | Wt 158.5 lb

## 2012-07-27 DIAGNOSIS — I951 Orthostatic hypotension: Secondary | ICD-10-CM

## 2012-07-27 DIAGNOSIS — I5032 Chronic diastolic (congestive) heart failure: Secondary | ICD-10-CM

## 2012-07-27 DIAGNOSIS — I2510 Atherosclerotic heart disease of native coronary artery without angina pectoris: Secondary | ICD-10-CM

## 2012-07-27 DIAGNOSIS — E782 Mixed hyperlipidemia: Secondary | ICD-10-CM

## 2012-07-27 DIAGNOSIS — I10 Essential (primary) hypertension: Secondary | ICD-10-CM

## 2012-07-27 NOTE — Assessment & Plan Note (Signed)
Currently with no symptoms of angina. No further workup at this time. Continue current medication regimen. 

## 2012-07-27 NOTE — Assessment & Plan Note (Signed)
She continues to have problems and has documentation of low blood pressures at baseline. We will hold her losartan to start. We'll encourage her to stay hydrated. If she continues to have problems, we could possibly decrease the dose of her carvedilol.

## 2012-07-27 NOTE — Progress Notes (Signed)
Patient ID: Erica [name], female    DOB: 12-Jan-1931, 78 y.o.   MRN: [id number]  HPI Comments: Erica [name] is a 78 year old woman with nonischemic cardiomyopathy CRT-D that was implanted at [facility]  with class 2-3 congestive heart failure with left bundle branch block, coronary artery disease with stenting to her RCA, hyperlipidemia, hypertension who presents for routine follow up. Previous history of bronchitis. She reports her husband passed away several months ago.   She reports that she has had significant fatigue and weakness. Blood pressure measurements show systolic pressures in the 90s at rest. Symptoms are worse with standing. Several medications were recommended on recent evaluation by Dr. Graciela Husbands and she has not been taking these.  She rarely takes Lasix as she has no significant shortness of breath or edema. She does report significant cramping in her legs and difficulty walking any distance.  Previous lab work shows total cholesterol 160, LDL 80  Echocardiogram April 2011 showing mild LV systolic dysfunction, ejection fraction 43%, mildly dilated left atrium, mild pulmonary hypertension with right ventricular systolic pressure of 33  Stress test April 2011 showing normal study with normal ejection fraction. Ejection fraction estimated at 79%. This was a lexiscan.  cardiac catheterization in January 2008 showing 40% restenosis of mid RCA stent and 50% mid LAD disease. Notes indicate 2 stents in her RCA. Catheterization October 2009 showing 40-50% LAD disease, 40% RCA disease, ejection fraction less than 20%.  CT scan report with concerns regarding subclavian stenosis   EKG shows paced rhythm at 81 beats per minute     Outpatient Encounter Prescriptions as of 07/27/2012  Medication Sig Dispense Refill  . Ascorbic Acid (VITAMIN C) 500 MG tablet Take 500 mg by mouth as needed.       Marland Kitchen aspirin 81 MG EC tablet Take 81 mg by mouth daily.        Marland Kitchen CALCIUM-VITAMIN D-VITAMIN K PO Take by mouth.  Takes 1 tablet daily.      . carvedilol (COREG) 12.5 MG tablet Take 1 tablet (12.5 mg total) by mouth 2 (two) times daily with a meal.  180 tablet  3  . clopidogrel (PLAVIX) 75 MG tablet TAKE 1 TABLET (75 MG TOTAL) BY MOUTH DAILY.  30 tablet  11  . ferrous sulfate 324 (65 FE) MG TBEC Take 324 mg by mouth daily as needed.      . furosemide (LASIX) 40 MG tablet Take 1 tablet (40 mg total) by mouth daily as needed.  90 tablet  3  . levothyroxine (SYNTHROID, LEVOTHROID) 75 MCG tablet Take 75 mcg by mouth daily.      Marland Kitchen LORazepam (ATIVAN) 1 MG tablet Take 1 mg by mouth at bedtime as needed.        Marland Kitchen losartan (COZAAR) 25 MG tablet Take 25 mg by mouth daily.      . Omega-3 Fatty Acids (RA FISH OIL) 900 MG CAPS Take by mouth daily.      . simvastatin (ZOCOR) 20 MG tablet Take 0.5 tablets (10 mg total) by mouth at bedtime.  30 tablet  6  . vitamin B-12 (CYANOCOBALAMIN) 1000 MCG tablet Take 1,000 mcg by mouth daily.          Review of Systems  Constitutional: Positive for fatigue.  HENT: Negative.   Eyes: Negative.   Respiratory: Negative.   Cardiovascular: Negative.   Gastrointestinal: Negative.   Musculoskeletal: Negative.   Skin: Negative.   Neurological: Positive for dizziness and weakness.  Hematological: Negative.  Psychiatric/Behavioral: Negative.   All other systems reviewed and are negative.    BP 110/62  Pulse 81  Ht 5\' 2"  (1.575 m)  Wt 158 lb 8 oz (71.895 kg)  BMI 28.99 kg/m2  Physical Exam  Nursing note and vitals reviewed. Constitutional: She is oriented to person, place, and time. She appears well-developed and well-nourished.  HENT:  Head: Normocephalic.  Nose: Nose normal.  Mouth/Throat: Oropharynx is clear and moist.  Eyes: Conjunctivae are normal. Pupils are equal, round, and reactive to light.  Neck: Normal range of motion. Neck supple. No JVD present.  Cardiovascular: Normal rate, regular rhythm, S1 normal, S2 normal, normal heart sounds and intact distal pulses.   Exam reveals no gallop and no friction rub.   No murmur heard. Pulmonary/Chest: Effort normal and breath sounds normal. No respiratory distress. She has no wheezes. She has no rales. She exhibits no tenderness.  Abdominal: Soft. Bowel sounds are normal. She exhibits no distension. There is no tenderness.  Musculoskeletal: Normal range of motion. She exhibits no edema and no tenderness.  Lymphadenopathy:    She has no cervical adenopathy.  Neurological: She is alert and oriented to person, place, and time. Coordination normal.  Skin: Skin is warm and dry. No rash noted. No erythema.  Psychiatric: She has a normal mood and affect. Her behavior is normal. Judgment and thought content normal.         Assessment and Plan

## 2012-07-27 NOTE — Assessment & Plan Note (Signed)
She is taking Lasix as needed. No recent edema or shortness of breath. Holding the losartan secondary to low blood pressure.

## 2012-07-27 NOTE — Patient Instructions (Addendum)
Hold the losartan for low blood pressure  For leg cramping, do a trial period for two weeks and hold the simvastatin  Please call us if you have new issues that need to be addressed before your next appt.  Your physician wants you to follow-up in: 3 months.  You will receive a reminder letter in the mail two months in advance. If you don't receive a letter, please call our office to schedule the follow-up appointment.

## 2012-07-27 NOTE — Assessment & Plan Note (Signed)
She has cramping in her legs which limits her ability to ambulate. We have suggested she do a trial and hold her simvastatin for 2 weeks.

## 2012-07-29 ENCOUNTER — Encounter: Payer: Self-pay | Admitting: *Deleted

## 2012-09-29 ENCOUNTER — Other Ambulatory Visit: Payer: Self-pay | Admitting: *Deleted

## 2012-09-29 MED ORDER — CLOPIDOGREL BISULFATE 75 MG PO TABS: 75.0000 mg | ORAL_TABLET | Freq: Every day | ORAL | Status: DC

## 2012-09-29 NOTE — Telephone Encounter (Signed)
Fax Received. Refill Completed. Irine Heminger Chowoe (R.M.A)   

## 2012-09-30 ENCOUNTER — Telehealth: Payer: Self-pay | Admitting: Cardiovascular Disease

## 2012-09-30 ENCOUNTER — Other Ambulatory Visit: Payer: Self-pay | Admitting: *Deleted

## 2012-09-30 NOTE — Telephone Encounter (Signed)
Pt needs cardiac clearance for Colonoscopy. Procedure scheduled for 11/19/12. Please fax to [phone]

## 2012-09-30 NOTE — Telephone Encounter (Signed)
See below and advise thanks 

## 2012-09-30 NOTE — Telephone Encounter (Signed)
Okay for colonoscopy, acceptable risk Can recall patient to see if she is still dizzy from low blood pressure If she is dizzy, we were going to decrease dose of carvedilol

## 2012-09-30 NOTE — Telephone Encounter (Signed)
Opened in Error.

## 2012-10-01 NOTE — Telephone Encounter (Signed)
Will fax note to # provided

## 2012-10-01 NOTE — Telephone Encounter (Signed)
Pt says dizziness has improved greatly No new issues I told her we are clearing her for colonoscopy and I am faxing letter

## 2012-10-11 ENCOUNTER — Encounter: Payer: Self-pay | Admitting: Internal Medicine

## 2012-10-11 ENCOUNTER — Ambulatory Visit (INDEPENDENT_AMBULATORY_CARE_PROVIDER_SITE_OTHER): Payer: Medicare Other | Admitting: *Deleted

## 2012-10-11 DIAGNOSIS — I5032 Chronic diastolic (congestive) heart failure: Secondary | ICD-10-CM

## 2012-10-11 DIAGNOSIS — I428 Other cardiomyopathies: Secondary | ICD-10-CM

## 2012-10-11 DIAGNOSIS — Z95810 Presence of automatic (implantable) cardiac defibrillator: Secondary | ICD-10-CM

## 2012-10-19 LAB — REMOTE ICD DEVICE
AL IMPEDENCE ICD: 380 Ohm
AL THRESHOLD: 0.5 V
BATTERY VOLTAGE: 2.9872 V
LV LEAD IMPEDENCE ICD: 912 Ohm
PACEART VT: 0
RV LEAD AMPLITUDE: 10.6 mv
TOT-0002: 0
TOT-0006: 20100115000000
TZAT-0001ATACH: 1
TZAT-0001FASTVT: 1
TZAT-0001SLOWVT: 1
TZAT-0002ATACH: NEGATIVE
TZAT-0002FASTVT: NEGATIVE
TZAT-0004SLOWVT: 8
TZAT-0012ATACH: 150 ms
TZAT-0012FASTVT: 200 ms
TZAT-0013SLOWVT: 2
TZAT-0018ATACH: NEGATIVE
TZAT-0018FASTVT: NEGATIVE
TZAT-0019ATACH: 6 V
TZAT-0019ATACH: 6 V
TZAT-0020ATACH: 1.5 ms
TZAT-0020ATACH: 1.5 ms
TZAT-0020SLOWVT: 1.5 ms
TZON-0003ATACH: 350 ms
TZON-0004SLOWVT: 16
TZST-0001ATACH: 4
TZST-0001ATACH: 5
TZST-0001FASTVT: 4
TZST-0001FASTVT: 5
TZST-0001FASTVT: 6
TZST-0001SLOWVT: 2
TZST-0001SLOWVT: 4
TZST-0001SLOWVT: 6
TZST-0002ATACH: NEGATIVE
TZST-0002FASTVT: NEGATIVE
TZST-0002FASTVT: NEGATIVE
TZST-0003SLOWVT: 25 J
TZST-0003SLOWVT: 35 J
VF: 0

## 2012-10-26 ENCOUNTER — Encounter: Payer: Self-pay | Admitting: Cardiovascular Disease

## 2012-10-26 ENCOUNTER — Ambulatory Visit (INDEPENDENT_AMBULATORY_CARE_PROVIDER_SITE_OTHER): Payer: Medicare Other | Admitting: Cardiovascular Disease

## 2012-10-26 VITALS — BP 146/80 | HR 87 | Ht 62.0 in | Wt 157.8 lb

## 2012-10-26 DIAGNOSIS — R42 Dizziness and giddiness: Secondary | ICD-10-CM

## 2012-10-26 DIAGNOSIS — I10 Essential (primary) hypertension: Secondary | ICD-10-CM

## 2012-10-26 DIAGNOSIS — I5032 Chronic diastolic (congestive) heart failure: Secondary | ICD-10-CM

## 2012-10-26 DIAGNOSIS — E782 Mixed hyperlipidemia: Secondary | ICD-10-CM

## 2012-10-26 DIAGNOSIS — I2510 Atherosclerotic heart disease of native coronary artery without angina pectoris: Secondary | ICD-10-CM

## 2012-10-26 DIAGNOSIS — R0602 Shortness of breath: Secondary | ICD-10-CM

## 2012-10-26 NOTE — Assessment & Plan Note (Signed)
Continue cortical 0.5 mg twice a day. Continue to hold losartan. Blood pressure has been well-controlled. Orthostatic hypotension symptoms have resolved without losartan.

## 2012-10-26 NOTE — Progress Notes (Signed)
Patient ID: Erica [name], female    DOB: 1930/12/16, 78 y.o.   MRN: [id number]  HPI Comments: Erica [name] is a 78 year old woman with nonischemic cardiomyopathy CRT-D that was implanted at [facility]  with class 2-3 congestive heart failure with left bundle branch block, coronary artery disease with stenting to her RCA, hyperlipidemia, hypertension who presents for routine follow up. Previous history of bronchitis. She reports her husband passed away earlier in 2012/04/01.   On her last clinic visit, she had  significant fatigue and weakness. Blood pressure measurements show systolic pressures in the 90s at rest. Symptoms are worse with standing. Losartan was held . Since then she has felt better . She also stopped simvastatin with possible mild improvement of her lower extremity cramping .  She continues to have weak legs with any significant exertion. She does not do regular exercise. Blood pressure has been well-controlled, occasional systolic pressure in the low 100s, heart rates in the low 80s  Previous lab work shows total cholesterol 160, LDL 80  Echocardiogram April 2011 showing mild LV systolic dysfunction, ejection fraction 43%, mildly dilated left atrium, mild pulmonary hypertension with right ventricular systolic pressure of 33  Stress test April 2011 showing normal study with normal ejection fraction. Ejection fraction estimated at 79%. This was a lexiscan.  cardiac catheterization in January 2008 showing 40% restenosis of mid RCA stent and 50% mid LAD disease. Notes indicate 2 stents in her RCA. Catheterization October 2009 showing 40-50% LAD disease, 40% RCA disease, ejection fraction less than 20%.  CT scan report with concerns regarding subclavian stenosis   EKG shows paced rhythm at 87 beats per minute     Outpatient Encounter Prescriptions as of 10/26/2012  Medication Sig Dispense Refill  . Ascorbic Acid (VITAMIN C) 500 MG tablet Take 500 mg by mouth as needed.       Marland Kitchen aspirin 81 MG  EC tablet Take 81 mg by mouth daily.        Marland Kitchen CALCIUM-VITAMIN D-VITAMIN K PO Take by mouth. Takes 1 tablet daily.      . carvedilol (COREG) 12.5 MG tablet Take 1 tablet (12.5 mg total) by mouth 2 (two) times daily with a meal.  180 tablet  3  . clopidogrel (PLAVIX) 75 MG tablet Take 1 tablet (75 mg total) by mouth daily.  30 tablet  6  . ferrous sulfate 324 (65 FE) MG TBEC Take 324 mg by mouth daily as needed.      . furosemide (LASIX) 40 MG tablet Take 1 tablet (40 mg total) by mouth daily as needed.  90 tablet  3  . levothyroxine (SYNTHROID, LEVOTHROID) 75 MCG tablet Take 75 mcg by mouth daily.      Marland Kitchen LORazepam (ATIVAN) 1 MG tablet Take 1 mg by mouth at bedtime as needed.        . Omega-3 Fatty Acids (RA FISH OIL) 900 MG CAPS Take by mouth daily.      . vitamin B-12 (CYANOCOBALAMIN) 1000 MCG tablet Take 1,000 mcg by mouth daily.        . [DISCONTINUED] losartan (COZAAR) 25 MG tablet Take 25 mg by mouth daily.      . [DISCONTINUED] simvastatin (ZOCOR) 20 MG tablet Take 0.5 tablets (10 mg total) by mouth at bedtime.  30 tablet  6    Review of Systems  HENT: Negative.   Eyes: Negative.   Respiratory: Negative.   Cardiovascular: Negative.   Gastrointestinal: Negative.   Musculoskeletal: Positive for gait  problem.  Skin: Negative.   Hematological: Negative.   Psychiatric/Behavioral: Negative.   All other systems reviewed and are negative.    BP 146/80  Pulse 87  Ht 5\' 2"  (1.575 m)  Wt 157 lb 12 oz (71.555 kg)  BMI 28.85 kg/m2  Physical Exam  Nursing note and vitals reviewed. Constitutional: She is oriented to person, place, and time. She appears well-developed and well-nourished.  HENT:  Head: Normocephalic.  Nose: Nose normal.  Mouth/Throat: Oropharynx is clear and moist.  Eyes: Conjunctivae normal are normal. Pupils are equal, round, and reactive to light.  Neck: Normal range of motion. Neck supple. No JVD present.  Cardiovascular: Normal rate, regular rhythm, S1 normal, S2  normal, normal heart sounds and intact distal pulses.  Exam reveals no gallop and no friction rub.   No murmur heard. Pulmonary/Chest: Effort normal and breath sounds normal. No respiratory distress. She has no wheezes. She has no rales. She exhibits no tenderness.  Abdominal: Soft. Bowel sounds are normal. She exhibits no distension. There is no tenderness.  Musculoskeletal: Normal range of motion. She exhibits no edema and no tenderness.  Lymphadenopathy:    She has no cervical adenopathy.  Neurological: She is alert and oriented to person, place, and time. Coordination normal.  Skin: Skin is warm and dry. No rash noted. No erythema.  Psychiatric: She has a normal mood and affect. Her behavior is normal. Judgment and thought content normal.         Assessment and Plan

## 2012-10-26 NOTE — Assessment & Plan Note (Signed)
She takes Lasix periodically for lower extremity swelling. Otherwise been stable

## 2012-10-26 NOTE — Assessment & Plan Note (Signed)
Currently with no symptoms of angina. No further workup at this time. Continue current medication regimen. 

## 2012-10-26 NOTE — Assessment & Plan Note (Signed)
We have suggested she restart her simvastatin and closely monitor her leg cramping symptoms. He did not seem that there was a significant change by holding the simvastatin

## 2012-10-26 NOTE — Patient Instructions (Addendum)
You are doing well. Restart simvastatin if tolerated  Try omeprazole one a day for heart burn, acid   Please call us if you have new issues that need to be addressed before your next appt.  Your physician wants you to follow-up in: 6 months.  You will receive a reminder letter in the mail two months in advance. If you don't receive a letter, please call our office to schedule the follow-up appointment.

## 2012-11-02 ENCOUNTER — Encounter: Payer: Self-pay | Admitting: *Deleted

## 2012-11-17 HISTORY — PX: CARDIAC CATHETERIZATION: SHX172

## 2012-11-19 ENCOUNTER — Ambulatory Visit: Payer: Self-pay | Admitting: Unknown Physician Specialty

## 2012-12-08 ENCOUNTER — Other Ambulatory Visit: Payer: Self-pay

## 2012-12-08 MED ORDER — CARVEDILOL 12.5 MG PO TABS: 12.5000 mg | ORAL_TABLET | Freq: Two times a day (BID) | ORAL | Status: DC

## 2012-12-08 NOTE — Telephone Encounter (Signed)
Refill sent for carvedilol.  

## 2012-12-15 ENCOUNTER — Other Ambulatory Visit: Payer: Self-pay | Admitting: Family Medicine

## 2012-12-15 DIAGNOSIS — Z9889 Other specified postprocedural states: Secondary | ICD-10-CM

## 2012-12-15 DIAGNOSIS — Z853 Personal history of malignant neoplasm of breast: Secondary | ICD-10-CM

## 2012-12-15 DIAGNOSIS — Z1231 Encounter for screening mammogram for malignant neoplasm of breast: Secondary | ICD-10-CM

## 2013-01-11 ENCOUNTER — Ambulatory Visit: Payer: Medicare Other

## 2013-01-17 ENCOUNTER — Encounter: Payer: Self-pay | Admitting: Internal Medicine

## 2013-01-17 ENCOUNTER — Ambulatory Visit (INDEPENDENT_AMBULATORY_CARE_PROVIDER_SITE_OTHER): Payer: Medicare Other | Admitting: *Deleted

## 2013-01-17 ENCOUNTER — Other Ambulatory Visit: Payer: Self-pay

## 2013-01-17 DIAGNOSIS — I5032 Chronic diastolic (congestive) heart failure: Secondary | ICD-10-CM

## 2013-01-17 DIAGNOSIS — Z95810 Presence of automatic (implantable) cardiac defibrillator: Secondary | ICD-10-CM

## 2013-01-19 LAB — REMOTE ICD DEVICE
AL IMPEDENCE ICD: 380 Ohm
AL THRESHOLD: 0.5 V
BAMS-0001: 170 {beats}/min
BATTERY VOLTAGE: 2.9531 V
FVT: 0
LV LEAD THRESHOLD: 0.875 V
PACEART VT: 0
RV LEAD AMPLITUDE: 10.9 mv
RV LEAD THRESHOLD: 0.875 V
TOT-0002: 0
TZAT-0001FASTVT: 1
TZAT-0001SLOWVT: 1
TZAT-0002ATACH: NEGATIVE
TZAT-0004SLOWVT: 8
TZAT-0005SLOWVT: 88 pct
TZAT-0012ATACH: 150 ms
TZAT-0012FASTVT: 200 ms
TZAT-0012SLOWVT: 200 ms
TZAT-0018ATACH: NEGATIVE
TZAT-0018FASTVT: NEGATIVE
TZAT-0019ATACH: 6 V
TZAT-0019ATACH: 6 V
TZAT-0019FASTVT: 8 V
TZAT-0020ATACH: 1.5 ms
TZAT-0020ATACH: 1.5 ms
TZAT-0020SLOWVT: 1.5 ms
TZON-0003SLOWVT: 330 ms
TZST-0001ATACH: 5
TZST-0001FASTVT: 2
TZST-0001FASTVT: 4
TZST-0001FASTVT: 6
TZST-0001SLOWVT: 2
TZST-0001SLOWVT: 4
TZST-0002ATACH: NEGATIVE
TZST-0002FASTVT: NEGATIVE
TZST-0002FASTVT: NEGATIVE
TZST-0002FASTVT: NEGATIVE
TZST-0003SLOWVT: 14 J
TZST-0003SLOWVT: 35 J
VENTRICULAR PACING ICD: 99.91 pct
VF: 0

## 2013-01-28 ENCOUNTER — Encounter: Payer: Self-pay | Admitting: *Deleted

## 2013-02-08 ENCOUNTER — Ambulatory Visit
Admission: RE | Admit: 2013-02-08 | Discharge: 2013-02-08 | Disposition: A | Discharge status: Home / Self Care | Payer: Medicare Other | Source: Ambulatory Visit | Attending: Family Medicine | Admitting: Family Medicine

## 2013-02-08 DIAGNOSIS — Z1231 Encounter for screening mammogram for malignant neoplasm of breast: Secondary | ICD-10-CM

## 2013-02-08 DIAGNOSIS — Z853 Personal history of malignant neoplasm of breast: Secondary | ICD-10-CM

## 2013-02-08 DIAGNOSIS — Z9889 Other specified postprocedural states: Secondary | ICD-10-CM

## 2013-04-19 ENCOUNTER — Ambulatory Visit (INDEPENDENT_AMBULATORY_CARE_PROVIDER_SITE_OTHER): Payer: Medicare Other | Admitting: Internal Medicine

## 2013-04-19 ENCOUNTER — Encounter: Payer: Self-pay | Admitting: Internal Medicine

## 2013-04-19 VITALS — BP 138/78 | HR 86 | Ht 62.0 in | Wt 157.0 lb

## 2013-04-19 DIAGNOSIS — I2510 Atherosclerotic heart disease of native coronary artery without angina pectoris: Secondary | ICD-10-CM

## 2013-04-19 DIAGNOSIS — Z95810 Presence of automatic (implantable) cardiac defibrillator: Secondary | ICD-10-CM

## 2013-04-19 DIAGNOSIS — I10 Essential (primary) hypertension: Secondary | ICD-10-CM

## 2013-04-19 DIAGNOSIS — I5032 Chronic diastolic (congestive) heart failure: Secondary | ICD-10-CM

## 2013-04-19 DIAGNOSIS — I951 Orthostatic hypotension: Secondary | ICD-10-CM

## 2013-04-19 LAB — ICD DEVICE OBSERVATION
AL IMPEDENCE ICD: 418 Ohm
ATRIAL PACING ICD: 0.03 pct
BAMS-0001: 170 {beats}/min
CHARGE TIME: 10.42 s
PACEART VT: 0
TOT-0001: 1
TOT-0002: 0
TZAT-0001ATACH: 2
TZAT-0001FASTVT: 1
TZAT-0002ATACH: NEGATIVE
TZAT-0002ATACH: NEGATIVE
TZAT-0004SLOWVT: 8
TZAT-0011SLOWVT: 10 ms
TZAT-0012ATACH: 150 ms
TZAT-0012SLOWVT: 200 ms
TZAT-0013SLOWVT: 2
TZAT-0018ATACH: NEGATIVE
TZAT-0019ATACH: 6 V
TZAT-0019ATACH: 6 V
TZAT-0020ATACH: 1.5 ms
TZAT-0020ATACH: 1.5 ms
TZAT-0020SLOWVT: 1.5 ms
TZON-0003SLOWVT: 330 ms
TZON-0003VSLOWVT: 370 ms
TZON-0004SLOWVT: 16
TZON-0004VSLOWVT: 20
TZST-0001ATACH: 5
TZST-0001FASTVT: 2
TZST-0001FASTVT: 4
TZST-0001FASTVT: 6
TZST-0001SLOWVT: 3
TZST-0001SLOWVT: 4
TZST-0001SLOWVT: 5
TZST-0002ATACH: NEGATIVE
TZST-0002FASTVT: NEGATIVE
TZST-0002FASTVT: NEGATIVE
TZST-0003SLOWVT: 25 J
TZST-0003SLOWVT: 35 J
VENTRICULAR PACING ICD: 99.94 pct

## 2013-04-19 NOTE — Patient Instructions (Addendum)
Dr. Graciela Husbands will talk with Dr. Mariah Milling and we will call you with further instructions.  Your physician wants you to follow-up in: 07/25/13 [facility] transmission. You will receive a reminder letter in the mail two months in advance. If you don't receive a letter, please call our office to schedule the follow-up appointment.

## 2013-04-19 NOTE — Assessment & Plan Note (Signed)
The patient's device was interrogated.  The information was reviewed. No changes were made in the programming.    

## 2013-04-19 NOTE — Assessment & Plan Note (Signed)
We need to assess cardiac perfusion and function. Her dyspnea on exertion may well be a function of HFpEF or HFrEF. Chronotropic competence is not currently an issue

## 2013-04-19 NOTE — Assessment & Plan Note (Signed)
She has recurrent exertional dyspnea and shoulder pain. This is apparently reminiscent of her pre-stent discomfort. There has been corns and noninvasive testing of her heart is likely we can proceed with catheterization to assess left ventricular function and coronary anatomy. I will review this with Dr. Knute Neu

## 2013-04-19 NOTE — Assessment & Plan Note (Signed)
Orthostatic symptoms were not mitigated by Mestinon. We will continue to follow and consider the use of ProAmatine

## 2013-04-19 NOTE — Progress Notes (Signed)
Patient Care Team: Erica [name] as PCP - General (Family Medicine)   HPI  Erica [name] is a 79 y.o. female seen in followup for her CRT-D that was implanted at [name] for severe ischemic/nonischemic cardiomyopathy and class 2-3 congestive heart failure with left bundle branch block. The patient's and family are not sure that there was significant benefit associated with the implantation. She has had no known arrhythmias.  Echo 2011 showed mild LV dysfunction with an improvement EF-43%  Stress 2011 demonstrated "normal left ventricular function" EF measured at 79%.  She has had significant problems with orthostatic intolerance and we started her on vasoactive therpay  This was not associated with any benefit.  Her complaint today is dyspnea on exertion and also accompanying by discomfort in her shoulder and her upper back. The second occurred just with walking but is also worse with eating and/or vomiting. These are symptoms similar to what found when she underwent her last catheterization      Past Medical History  Diagnosis Date  . Systolic CHF   . Coronary artery disease   . MI (myocardial infarction)   . Cardiomyopathy     nonischemic/ischemic  . Dyslipidemia   . Hypertension   . COPD (chronic obstructive pulmonary disease)   . Breast cancer   . Hypothyroidism   . Pneumonia   . Basal cell carcinoma 2011    Past Surgical History  Procedure Laterality Date  . Cardiac catheterization    . Cardiac defibrillator placement    . Insert / replace / remove pacemaker    . Mastectomy    . Abdominal hysterectomy    . Basal cell carcinoma excision      Monh's procedure    Current Outpatient Prescriptions  Medication Sig Dispense Refill  . Ascorbic Acid (VITAMIN C) 500 MG tablet Take 500 mg by mouth as needed.       Marland Kitchen aspirin 81 MG EC tablet Take 81 mg by mouth daily.        Marland Kitchen CALCIUM-VITAMIN D-VITAMIN K PO Take by mouth. Takes 1 tablet daily.      . carvedilol (COREG) 12.5 MG  tablet Take 1 tablet (12.5 mg total) by mouth 2 (two) times daily with a meal.  180 tablet  3  . clopidogrel (PLAVIX) 75 MG tablet Take 1 tablet (75 mg total) by mouth daily.  30 tablet  6  . ferrous sulfate 324 (65 FE) MG TBEC Take 324 mg by mouth daily as needed.      . furosemide (LASIX) 40 MG tablet Take 1 tablet (40 mg total) by mouth daily as needed.  90 tablet  3  . levothyroxine (SYNTHROID, LEVOTHROID) 75 MCG tablet Take 75 mcg by mouth daily.      Marland Kitchen LORazepam (ATIVAN) 1 MG tablet Take 1 mg by mouth at bedtime as needed.        . Omega-3 Fatty Acids (RA FISH OIL) 900 MG CAPS Take by mouth daily.      . vitamin B-12 (CYANOCOBALAMIN) 1000 MCG tablet Take 1,000 mcg by mouth daily.         No current facility-administered medications for this visit.    Allergies  Allergen Reactions  . Ace Inhibitors   . Amlodipine-Atorvastatin   . Ciprofloxacin   . Quinolones     Review of Systems negative except from HPI and PMH  Physical Exam BP 138/78  Pulse 86  Ht 5\' 2"  (1.575 m)  Wt 157 lb (71.215 kg)  BMI  28.71 kg/m2 Well developed and well nourished in no acute distress HENT normal E scleral and icterus clear Neck Supple JVP flat; carotids brisk and full Clear to ausculation  Regular rate and rhythm,  Positive S4 early systolic murmur Soft with active bowel sounds No clubbing cyanosis none Edema Alert and oriented, grossly normal motor and sensory function Skin Warm and Dry  ECG demonstrates P. Synchronous pacing  Assessment and  Plan

## 2013-04-25 ENCOUNTER — Telehealth: Payer: Self-pay

## 2013-04-25 NOTE — Telephone Encounter (Signed)
lmtcb

## 2013-04-25 NOTE — Telephone Encounter (Signed)
Per Dr. Mariah Milling, he and Dr. Graciela Husbands have talked and decided to schedule pt for [facility] 6/13 Will call pt to arrange

## 2013-04-26 ENCOUNTER — Encounter: Payer: Self-pay | Admitting: Cardiovascular Disease

## 2013-04-26 ENCOUNTER — Ambulatory Visit (INDEPENDENT_AMBULATORY_CARE_PROVIDER_SITE_OTHER): Payer: Medicare Other | Admitting: Cardiovascular Disease

## 2013-04-26 VITALS — BP 120/62 | HR 86 | Ht 63.0 in | Wt 159.0 lb

## 2013-04-26 DIAGNOSIS — I2510 Atherosclerotic heart disease of native coronary artery without angina pectoris: Secondary | ICD-10-CM

## 2013-04-26 DIAGNOSIS — N289 Disorder of kidney and ureter, unspecified: Secondary | ICD-10-CM

## 2013-04-26 DIAGNOSIS — R0602 Shortness of breath: Secondary | ICD-10-CM

## 2013-04-26 DIAGNOSIS — R5383 Other fatigue: Secondary | ICD-10-CM

## 2013-04-26 DIAGNOSIS — E782 Mixed hyperlipidemia: Secondary | ICD-10-CM

## 2013-04-26 DIAGNOSIS — I10 Essential (primary) hypertension: Secondary | ICD-10-CM

## 2013-04-26 DIAGNOSIS — Z95810 Presence of automatic (implantable) cardiac defibrillator: Secondary | ICD-10-CM

## 2013-04-26 DIAGNOSIS — I5032 Chronic diastolic (congestive) heart failure: Secondary | ICD-10-CM

## 2013-04-26 DIAGNOSIS — R5381 Other malaise: Secondary | ICD-10-CM

## 2013-04-26 MED ORDER — PRAVASTATIN SODIUM 20 MG PO TABS: 20.0000 mg | ORAL_TABLET | Freq: Every evening | ORAL | Status: DC

## 2013-04-26 NOTE — Progress Notes (Signed)
Patient ID: Erica [name], female    DOB: [birth year], 79 y.o.   MRN: [id number]  HPI Comments: [name] is a 79 year old woman with nonischemic cardiomyopathy CRT-D that was implanted at [facility]  with class 2-3 congestive heart failure with left bundle branch block, coronary artery disease with stenting to her RCA, hyperlipidemia, hypertension who presents for routine follow up. Previous history of bronchitis. She reports her husband passed away earlier in Mar 29, 2012.   On previous visits, she has had fatigue and weakness.  Losartan was held for low blood pressure and she felt better.   She  stopped simvastatin with possible mild improvement of her lower extremity cramping .  She continues to have weak legs with any significant exertion. She does not do regular exercise. Today she reports occasional chest tightness, shortness of breath. Occasional dizziness. She was seen by Dr. Graciela Husbands recently and recommended she have a cardiac catheterization given her severe underlying CAD seen 5 years ago. She states that she has a renal physician that she sees for underlying kidney disease. No recent lab work available  Echocardiogram April 2011 showing mild LV systolic dysfunction, ejection fraction 43%, mildly dilated left atrium, mild pulmonary hypertension with right ventricular systolic pressure of 33  Stress test April 2011 showing normal study with normal ejection fraction. Ejection fraction estimated at 79%. This was a lexiscan.  cardiac catheterization in January 2008 showing 40% restenosis of mid RCA stent and 50% mid LAD disease. Notes indicate 2 stents in her RCA. Catheterization October 2009 showing 40-50% LAD disease, 40% RCA disease, ejection fraction less than 20%.  CT scan report with concerns regarding subclavian stenosis   EKG shows paced rhythm at 86 beats per minute     Outpatient Encounter Prescriptions as of 04/26/2013  Medication Sig Dispense Refill  . Ascorbic Acid (VITAMIN C) 500 MG  tablet Take 500 mg by mouth as needed.       Marland Kitchen aspirin 81 MG EC tablet Take 81 mg by mouth daily.        Marland Kitchen CALCIUM-VITAMIN D-VITAMIN K PO Take by mouth. Takes 1 tablet daily.      . carvedilol (COREG) 12.5 MG tablet Take 1 tablet (12.5 mg total) by mouth 2 (two) times daily with a meal.  180 tablet  3  . clopidogrel (PLAVIX) 75 MG tablet Take 1 tablet (75 mg total) by mouth daily.  30 tablet  6  . ferrous sulfate 324 (65 FE) MG TBEC Take 324 mg by mouth daily as needed.      . furosemide (LASIX) 40 MG tablet Take 1 tablet (40 mg total) by mouth daily as needed.  90 tablet  3  . levothyroxine (SYNTHROID, LEVOTHROID) 75 MCG tablet Take 75 mcg by mouth daily.      Marland Kitchen LORazepam (ATIVAN) 1 MG tablet Take 1 mg by mouth at bedtime as needed.        . Omega-3 Fatty Acids (RA FISH OIL) 900 MG CAPS Take by mouth daily.      . vitamin B-12 (CYANOCOBALAMIN) 1000 MCG tablet Take 1,000 mcg by mouth daily.         Review of Systems  Constitutional: Negative.   HENT: Negative.   Eyes: Negative.   Respiratory: Positive for shortness of breath.   Cardiovascular: Positive for chest pain.  Gastrointestinal: Negative.   Musculoskeletal: Positive for gait problem.  Skin: Negative.   Neurological: Negative.   Psychiatric/Behavioral: Negative.   All other systems reviewed and are negative.  BP 120/62  Pulse 86  Ht 5\' 3"  (1.6 m)  Wt 159 lb (72.122 kg)  BMI 28.17 kg/m2  Physical Exam  Nursing note and vitals reviewed. Constitutional: She is oriented to person, place, and time. She appears well-developed and well-nourished.  HENT:  Head: Normocephalic.  Nose: Nose normal.  Mouth/Throat: Oropharynx is clear and moist.  Eyes: Conjunctivae are normal. Pupils are equal, round, and reactive to light.  Neck: Normal range of motion. Neck supple. No JVD present.  Cardiovascular: Normal rate, regular rhythm, S1 normal, S2 normal, normal heart sounds and intact distal pulses.  Exam reveals no gallop and no  friction rub.   No murmur heard. Pulmonary/Chest: Effort normal and breath sounds normal. No respiratory distress. She has no wheezes. She has no rales. She exhibits no tenderness.  Abdominal: Soft. Bowel sounds are normal. She exhibits no distension. There is no tenderness.  Musculoskeletal: Normal range of motion. She exhibits no edema and no tenderness.  Lymphadenopathy:    She has no cervical adenopathy.  Neurological: She is alert and oriented to person, place, and time. Coordination normal.  Skin: Skin is warm and dry. No rash noted. No erythema.  Psychiatric: She has a normal mood and affect. Her behavior is normal. Judgment and thought content normal.    Assessment and Plan

## 2013-04-26 NOTE — Assessment & Plan Note (Signed)
Symptoms of chest pain and shortness of breath concerning for angina. Seen by Dr. Graciela Husbands recently with recommendation for cardiac catheterization. She is willing to have cardiac catheterization. We will check her renal function first given history of renal insufficiency. She reports low GFR. We will call her with the results and make a decision at that time. If renal function is poor, we could consider nuclear Myoview.

## 2013-04-26 NOTE — Assessment & Plan Note (Addendum)
She is uncertain if he simvastatin was causing her leg cramping. We will try an alternate statin, pravastatin 20 mg daily. She reports that her cholesterol was very good. She will discuss her cholesterol with Dr. Suzie Portela tomorrow when she sees him. Most recent lab work not available to [place] today. Ideally goal LDL is less than 70.

## 2013-04-26 NOTE — Assessment & Plan Note (Signed)
Clinically she appears to be doing well, euvolemic. Last echo several years ago with mild LV dysfunction. No EF documented as study was done at outside facility. Suspect 40-45% at the time up from 20%.

## 2013-04-26 NOTE — Assessment & Plan Note (Signed)
Followed by Dr. Steve Klein  

## 2013-04-26 NOTE — Patient Instructions (Addendum)
You are doing well. No medication changes were made.  We will check the BMP today (labs) Talk with Dr. Marguerite Olea about starting pravastatin Goal LDL <70  Please call us if you have new issues that need to be addressed before your next appt.  Your physician wants you to follow-up in: 6 months.  You will receive a reminder letter in the mail two months in advance. If you don't receive a letter, please call our office to schedule the follow-up appointment.

## 2013-04-26 NOTE — Assessment & Plan Note (Signed)
She reports that her blood pressure has been relatively stable. Continue on current medications

## 2013-04-27 LAB — BASIC METABOLIC PANEL
BUN/Creatinine Ratio: 22 (ref 11–26)
BUN: 25 mg/dL (ref 8–27)
CO2: 28 mmol/L (ref 19–28)
Calcium: 9.3 mg/dL (ref 8.6–10.2)
Chloride: 104 mmol/L (ref 97–108)
Creatinine, Ser: 1.15 mg/dL — ABNORMAL HIGH (ref 0.57–1.00)
GFR calc Af Amer: 52 mL/min/{1.73_m2} — ABNORMAL LOW (ref >59)
GFR calc non Af Amer: 45 mL/min/{1.73_m2} — ABNORMAL LOW (ref >59)
Glucose: 95 mg/dL (ref 65–99)
Potassium: 5 mmol/L (ref 3.5–5.2)
Sodium: 141 mmol/L (ref 134–144)

## 2013-04-27 NOTE — Telephone Encounter (Signed)
Pt seen in office 6/10

## 2013-04-29 ENCOUNTER — Other Ambulatory Visit: Payer: Self-pay

## 2013-04-29 DIAGNOSIS — R079 Chest pain, unspecified: Secondary | ICD-10-CM

## 2013-05-10 ENCOUNTER — Other Ambulatory Visit: Payer: Self-pay | Admitting: Emergency Medicine

## 2013-05-10 ENCOUNTER — Ambulatory Visit: Payer: Self-pay | Admitting: Cardiovascular Disease

## 2013-05-10 ENCOUNTER — Ambulatory Visit (INDEPENDENT_AMBULATORY_CARE_PROVIDER_SITE_OTHER): Payer: Medicare Other

## 2013-05-10 DIAGNOSIS — R079 Chest pain, unspecified: Secondary | ICD-10-CM

## 2013-05-10 MED ORDER — CLOPIDOGREL BISULFATE 75 MG PO TABS: 75.0000 mg | ORAL_TABLET | Freq: Every day | ORAL | Status: DC

## 2013-05-11 ENCOUNTER — Other Ambulatory Visit: Payer: Self-pay

## 2013-05-11 DIAGNOSIS — R079 Chest pain, unspecified: Secondary | ICD-10-CM

## 2013-05-13 ENCOUNTER — Ambulatory Visit: Payer: Self-pay | Admitting: Cardiovascular Disease

## 2013-05-13 DIAGNOSIS — I2510 Atherosclerotic heart disease of native coronary artery without angina pectoris: Secondary | ICD-10-CM

## 2013-05-16 ENCOUNTER — Ambulatory Visit (INDEPENDENT_AMBULATORY_CARE_PROVIDER_SITE_OTHER): Payer: Medicare Other | Admitting: *Deleted

## 2013-05-16 ENCOUNTER — Encounter: Payer: Self-pay | Admitting: Internal Medicine

## 2013-05-16 DIAGNOSIS — I5032 Chronic diastolic (congestive) heart failure: Secondary | ICD-10-CM

## 2013-05-16 DIAGNOSIS — Z95810 Presence of automatic (implantable) cardiac defibrillator: Secondary | ICD-10-CM

## 2013-05-17 LAB — REMOTE ICD DEVICE
AL IMPEDENCE ICD: 380 Ohm
AL THRESHOLD: 0.375 V
BATTERY VOLTAGE: 2.9071 V
CHARGE TIME: 10.42 s
LV LEAD IMPEDENCE ICD: 855 Ohm
TOT-0001: 1
TOT-0002: 0
TOT-0006: 20100115000000
TZAT-0001FASTVT: 1
TZAT-0001SLOWVT: 1
TZAT-0002ATACH: NEGATIVE
TZAT-0002FASTVT: NEGATIVE
TZAT-0012ATACH: 150 ms
TZAT-0012ATACH: 150 ms
TZAT-0012SLOWVT: 200 ms
TZAT-0013SLOWVT: 2
TZAT-0018ATACH: NEGATIVE
TZAT-0018FASTVT: NEGATIVE
TZAT-0019ATACH: 6 V
TZAT-0019ATACH: 6 V
TZAT-0020ATACH: 1.5 ms
TZAT-0020ATACH: 1.5 ms
TZAT-0020SLOWVT: 1.5 ms
TZON-0003ATACH: 350 ms
TZON-0003SLOWVT: 330 ms
TZON-0003VSLOWVT: 370 ms
TZON-0004SLOWVT: 16
TZON-0005SLOWVT: 12
TZST-0001ATACH: 4
TZST-0001FASTVT: 2
TZST-0001FASTVT: 3
TZST-0001FASTVT: 4
TZST-0001FASTVT: 6
TZST-0001SLOWVT: 2
TZST-0001SLOWVT: 4
TZST-0001SLOWVT: 5
TZST-0002FASTVT: NEGATIVE
TZST-0002FASTVT: NEGATIVE
TZST-0002FASTVT: NEGATIVE
TZST-0003SLOWVT: 25 J
TZST-0003SLOWVT: 35 J
TZST-0003SLOWVT: 35 J
VENTRICULAR PACING ICD: 99.96 pct

## 2013-05-18 ENCOUNTER — Telehealth: Payer: Self-pay

## 2013-05-18 NOTE — Telephone Encounter (Signed)
Spoke with pt today and she is concerned because she noticed a knot by the site of cardiac cath. She mentioned that she doesn't feel any pain or notice any redness or feverish looking around site. She is just wondering if she should be concerned being that she took bandage off and was wondering if she could put ice or should put anything on it. Patient has appointment 05/24/13 TG. Please advise.

## 2013-05-18 NOTE — Telephone Encounter (Signed)
Pt states she has a knot in her groin area, not in the site of the cath, concerned

## 2013-05-18 NOTE — Telephone Encounter (Signed)
Probably normal if small, non painful and not getting bigger If needed, could come in quick to look on Thursday

## 2013-05-19 ENCOUNTER — Ambulatory Visit (INDEPENDENT_AMBULATORY_CARE_PROVIDER_SITE_OTHER): Payer: Medicare Other

## 2013-05-19 VITALS — BP 122/70 | HR 88 | Ht 63.0 in | Wt 157.2 lb

## 2013-05-19 DIAGNOSIS — R1030 Lower abdominal pain, unspecified: Secondary | ICD-10-CM

## 2013-05-19 DIAGNOSIS — I2510 Atherosclerotic heart disease of native coronary artery without angina pectoris: Secondary | ICD-10-CM

## 2013-05-19 DIAGNOSIS — R109 Unspecified abdominal pain: Secondary | ICD-10-CM

## 2013-05-19 NOTE — Progress Notes (Signed)
Pt here for c/o knot in right groin s/p LHC 05/13/13 Says this knot was not present day after cath Denies pain in lower back or groin  There is a knot noted at right groin, about the size of a small grape No drainage noted, no erythema No bruit heard on auscultation Will have Dr. Mariah Milling assess  Dr. Mariah Milling in to assess hematoma He agrees it is the size of a marble Pt has appt with him 05/24/13 He will reassess at that time and if no improvement may order U/S Pt was reassured by Dr. Mariah Milling and was advised to refrain from heavy lifting/strenuous activities until 7/8 appt

## 2013-05-19 NOTE — Patient Instructions (Addendum)
Keep appt as scheduled with [place] 7/8

## 2013-05-19 NOTE — Telephone Encounter (Signed)
Coming in today

## 2013-05-24 ENCOUNTER — Ambulatory Visit (INDEPENDENT_AMBULATORY_CARE_PROVIDER_SITE_OTHER): Payer: Medicare Other | Admitting: Cardiovascular Disease

## 2013-05-24 ENCOUNTER — Encounter: Payer: Self-pay | Admitting: Cardiovascular Disease

## 2013-05-24 VITALS — BP 120/56 | HR 84 | Ht 63.0 in | Wt 158.5 lb

## 2013-05-24 DIAGNOSIS — I2510 Atherosclerotic heart disease of native coronary artery without angina pectoris: Secondary | ICD-10-CM

## 2013-05-24 DIAGNOSIS — I10 Essential (primary) hypertension: Secondary | ICD-10-CM

## 2013-05-24 DIAGNOSIS — I5032 Chronic diastolic (congestive) heart failure: Secondary | ICD-10-CM

## 2013-05-24 DIAGNOSIS — E782 Mixed hyperlipidemia: Secondary | ICD-10-CM

## 2013-05-24 NOTE — Assessment & Plan Note (Signed)
Blood pressure is well controlled on today's visit. No changes made to the medications. 

## 2013-05-24 NOTE — Patient Instructions (Addendum)
You are doing well. No medication changes were made.  We will place an order for the cardiac rehab  Please call us if you have new issues that need to be addressed before your next appt.  Your physician wants you to follow-up in: 6 months.  You will receive a reminder letter in the mail two months in advance. If you don't receive a letter, please call our office to schedule the follow-up appointment.

## 2013-05-24 NOTE — Assessment & Plan Note (Signed)
Stable right coronary artery disease, mild change since 2009. Moderate at this time. We will recommend cardiac rehabilitation to work on her conditioning.

## 2013-05-24 NOTE — Assessment & Plan Note (Signed)
Appears euvolemic. She takes Lasix only as needed for edema or worsening shortness of breath.

## 2013-05-24 NOTE — Assessment & Plan Note (Signed)
So far she is tolerating pravastatin 10 mg daily. We'll slowly titrate this upwards.

## 2013-05-24 NOTE — Progress Notes (Signed)
Patient ID: Erica [name], female    DOB: Aug 27, 1931, 79 y.o.   MRN: [id number]  HPI Comments: Erica [name] is a 79 year old woman with nonischemic cardiomyopathy CRT-D that was implanted at [facility]  with class 2-3 congestive heart failure with left bundle branch block, coronary artery disease with stenting to her RCA, hyperlipidemia, hypertension  Previous history of bronchitis. She reports her husband passed away earlier in Apr 10, 2012. She presents for routine followup after recent cardiac catheterization.   She had been reporting fatigue and weakness, shortness of breath with exertion. She seen by Dr. Graciela Husbands and it was recommended she have cardiac catheterization given previous moderate RCA disease.   Cardiac catheterization showed moderate proximal and distal RCA disease estimated at 60%. Also mild to moderate proximal to mid LAD disease. Ejection fraction estimated at greater than 55%. There is no significant change in her RCA lesions compared to 04-10-08. There is mild progression of the distal RCA lesion from a mild to moderate stenosis. The lesions were discussed with interventional cardiology and medical management was recommended .  In followup today, she feels well. She did have some ecchymosis and bruising in her right groin. A closure device did not get placed appropriately and she had small groin hematoma. She continues to have shortness of breath with exertion. She would like to participate in cardiac rehabilitation to work on her conditioning.   Previous  Echo April 2011 showing mild LV systolic dysfunction, ejection fraction 43%, mildly dilated left atrium, mild pulmonary hypertension with right ventricular systolic pressure of 33  Stress test April 2011 showing normal study with normal ejection fraction. Ejection fraction estimated at 79%. This was a lexiscan.  cardiac catheterization in January 2008 showing 40% restenosis of mid RCA stent and 50% mid LAD disease. Notes indicate 2 stents in her  RCA.  EKG shows paced rhythm at 86 beats per minute     Outpatient Encounter Prescriptions as of 05/24/2013  Medication Sig Dispense Refill  . Ascorbic Acid (VITAMIN C) 500 MG tablet Take 500 mg by mouth as needed.       Marland Kitchen aspirin 81 MG EC tablet Take 81 mg by mouth daily.        Marland Kitchen CALCIUM-VITAMIN D-VITAMIN K PO Take by mouth. Takes 1 tablet daily.      . carvedilol (COREG) 12.5 MG tablet Take 6.25 mg by mouth 2 (two) times daily with a meal.      . clopidogrel (PLAVIX) 75 MG tablet Take 1 tablet (75 mg total) by mouth daily.  30 tablet  6  . ferrous sulfate 324 (65 FE) MG TBEC Take 324 mg by mouth daily as needed.      . furosemide (LASIX) 40 MG tablet Take 1 tablet (40 mg total) by mouth daily as needed.  90 tablet  3  . levothyroxine (SYNTHROID, LEVOTHROID) 75 MCG tablet Take 75 mcg by mouth daily.      Marland Kitchen LORazepam (ATIVAN) 1 MG tablet Take 1 mg by mouth at bedtime as needed.        . pravastatin (PRAVACHOL) 10 MG tablet Take 10 mg by mouth daily.      . vitamin B-12 (CYANOCOBALAMIN) 1000 MCG tablet Take 1,000 mcg by mouth daily.         Review of Systems  Constitutional: Negative.   HENT: Negative.   Eyes: Negative.   Respiratory: Positive for shortness of breath.   Gastrointestinal: Negative.   Musculoskeletal: Positive for gait problem.  Skin: Negative.  Neurological: Negative.   Psychiatric/Behavioral: Negative.   All other systems reviewed and are negative.    BP 120/56  Pulse 84  Ht 5\' 3"  (1.6 m)  Wt 158 lb 8 oz (71.895 kg)  BMI 28.08 kg/m2  Physical Exam  Nursing note and vitals reviewed. Constitutional: She is oriented to person, place, and time. She appears well-developed and well-nourished.  HENT:  Head: Normocephalic.  Nose: Nose normal.  Mouth/Throat: Oropharynx is clear and moist.  Eyes: Conjunctivae are normal. Pupils are equal, round, and reactive to light.  Neck: Normal range of motion. Neck supple. No JVD present.  Cardiovascular: Normal rate,  regular rhythm, S1 normal, S2 normal, normal heart sounds and intact distal pulses.  Exam reveals no gallop and no friction rub.   No murmur heard. Pulmonary/Chest: Effort normal and breath sounds normal. No respiratory distress. She has no wheezes. She has no rales. She exhibits no tenderness.  Abdominal: Soft. Bowel sounds are normal. She exhibits no distension. There is no tenderness.  Musculoskeletal: Normal range of motion. She exhibits no edema and no tenderness.  Lymphadenopathy:    She has no cervical adenopathy.  Neurological: She is alert and oriented to person, place, and time. Coordination normal.  Skin: Skin is warm and dry. No rash noted. No erythema.  Psychiatric: She has a normal mood and affect. Her behavior is normal. Judgment and thought content normal.    Assessment and Plan

## 2013-05-26 ENCOUNTER — Encounter: Payer: Self-pay | Admitting: *Deleted

## 2013-06-02 ENCOUNTER — Encounter: Payer: Self-pay | Admitting: *Deleted

## 2013-06-22 ENCOUNTER — Other Ambulatory Visit: Payer: Self-pay

## 2013-06-30 ENCOUNTER — Telehealth: Payer: Self-pay

## 2013-06-30 ENCOUNTER — Other Ambulatory Visit: Payer: Self-pay | Admitting: *Deleted

## 2013-06-30 MED ORDER — CLOPIDOGREL BISULFATE 75 MG PO TABS: 75.0000 mg | ORAL_TABLET | Freq: Every day | ORAL | Status: DC

## 2013-06-30 NOTE — Telephone Encounter (Signed)
Refilled Clopidogrel sent to [facility] pharmacy. 

## 2013-06-30 NOTE — Telephone Encounter (Signed)
Pt was wanting a 90 day supply on her meds.  Plavix refill already sent in, but she is not due for another refill on her Coreg yet.  Instructed pt to have pharmacy send refill request when ready.

## 2013-06-30 NOTE — Telephone Encounter (Signed)
Pt states her coreg is supposed to be 6 mg if dr Mariah Milling approves it.

## 2013-07-14 ENCOUNTER — Other Ambulatory Visit: Payer: Self-pay | Admitting: Cardiovascular Disease

## 2013-07-15 ENCOUNTER — Other Ambulatory Visit: Payer: Self-pay

## 2013-07-15 MED ORDER — CARVEDILOL 6.25 MG PO TABS: 6.2500 mg | ORAL_TABLET | Freq: Two times a day (BID) | ORAL | Status: DC

## 2013-07-15 NOTE — Telephone Encounter (Signed)
Refill sent for coreg 6.25 mg take one tablet twice refill.

## 2013-07-25 ENCOUNTER — Encounter: Payer: Medicare Other | Admitting: *Deleted

## 2013-08-02 ENCOUNTER — Encounter: Payer: Self-pay | Admitting: *Deleted

## 2013-08-08 ENCOUNTER — Ambulatory Visit (INDEPENDENT_AMBULATORY_CARE_PROVIDER_SITE_OTHER): Payer: Medicare Other | Admitting: *Deleted

## 2013-08-08 ENCOUNTER — Encounter: Payer: Self-pay | Admitting: Internal Medicine

## 2013-08-08 DIAGNOSIS — I5032 Chronic diastolic (congestive) heart failure: Secondary | ICD-10-CM

## 2013-08-08 DIAGNOSIS — Z95810 Presence of automatic (implantable) cardiac defibrillator: Secondary | ICD-10-CM

## 2013-08-13 LAB — REMOTE ICD DEVICE
AL AMPLITUDE: 1.5 mv
AL THRESHOLD: 0.5 V
BATTERY VOLTAGE: 2.833 V
CHARGE TIME: 10.72 s
FVT: 0
LV LEAD THRESHOLD: 0.875 V
RV LEAD AMPLITUDE: 9.5 mv
RV LEAD IMPEDENCE ICD: 684 Ohm
TOT-0001: 1
TZAT-0001ATACH: 1
TZAT-0001FASTVT: 1
TZAT-0002ATACH: NEGATIVE
TZAT-0002ATACH: NEGATIVE
TZAT-0002ATACH: NEGATIVE
TZAT-0012ATACH: 150 ms
TZAT-0012FASTVT: 200 ms
TZAT-0018ATACH: NEGATIVE
TZAT-0018ATACH: NEGATIVE
TZAT-0018FASTVT: NEGATIVE
TZAT-0019ATACH: 6 V
TZAT-0019ATACH: 6 V
TZAT-0019SLOWVT: 8 V
TZAT-0020ATACH: 1.5 ms
TZAT-0020FASTVT: 1.5 ms
TZAT-0020SLOWVT: 1.5 ms
TZST-0001ATACH: 4
TZST-0001ATACH: 5
TZST-0001FASTVT: 4
TZST-0001FASTVT: 5
TZST-0001SLOWVT: 2
TZST-0001SLOWVT: 4
TZST-0001SLOWVT: 6
TZST-0002ATACH: NEGATIVE
TZST-0002FASTVT: NEGATIVE
TZST-0002FASTVT: NEGATIVE
TZST-0003SLOWVT: 14 J
TZST-0003SLOWVT: 35 J
TZST-0003SLOWVT: 35 J
VF: 0

## 2013-08-22 ENCOUNTER — Encounter: Payer: Self-pay | Admitting: *Deleted

## 2013-09-22 ENCOUNTER — Other Ambulatory Visit: Payer: Self-pay

## 2013-09-24 ENCOUNTER — Other Ambulatory Visit: Payer: Self-pay | Admitting: Cardiovascular Disease

## 2013-09-26 ENCOUNTER — Other Ambulatory Visit: Payer: Self-pay | Admitting: *Deleted

## 2013-09-26 MED ORDER — PRAVASTATIN SODIUM 10 MG PO TABS: 10.0000 mg | ORAL_TABLET | Freq: Every day | ORAL | Status: DC

## 2013-09-26 NOTE — Telephone Encounter (Signed)
Requested Prescriptions   Signed Prescriptions Disp Refills  . pravastatin (PRAVACHOL) 10 MG tablet 30 tablet 3    Sig: Take 1 tablet (10 mg total) by mouth daily.    Authorizing Provider: GOLLAN, TIMOTHY J    Ordering User: LOPEZ, MARINA C    

## 2013-11-01 ENCOUNTER — Ambulatory Visit (INDEPENDENT_AMBULATORY_CARE_PROVIDER_SITE_OTHER): Payer: Medicare Other | Admitting: Cardiovascular Disease

## 2013-11-01 ENCOUNTER — Encounter: Payer: Self-pay | Admitting: Cardiovascular Disease

## 2013-11-01 VITALS — BP 140/82 | HR 86 | Ht 63.0 in | Wt 155.2 lb

## 2013-11-01 DIAGNOSIS — Z95810 Presence of automatic (implantable) cardiac defibrillator: Secondary | ICD-10-CM

## 2013-11-01 DIAGNOSIS — I5032 Chronic diastolic (congestive) heart failure: Secondary | ICD-10-CM

## 2013-11-01 DIAGNOSIS — I10 Essential (primary) hypertension: Secondary | ICD-10-CM

## 2013-11-01 DIAGNOSIS — E782 Mixed hyperlipidemia: Secondary | ICD-10-CM

## 2013-11-01 DIAGNOSIS — I2510 Atherosclerotic heart disease of native coronary artery without angina pectoris: Secondary | ICD-10-CM

## 2013-11-01 NOTE — Assessment & Plan Note (Signed)
Followed by Dr. Klein 

## 2013-11-01 NOTE — Assessment & Plan Note (Signed)
Stressed to her the importance of taking her statin given her severe underlying CAD. Goal LDL less than 70.

## 2013-11-01 NOTE — Assessment & Plan Note (Signed)
Taking Lasix periodically for any leg swelling. Doing well recently. Appears euvolemic

## 2013-11-01 NOTE — Progress Notes (Signed)
Patient ID: Erica [name], female    DOB: 1931/02/04, 79 y.o.   MRN: [id number]  HPI Comments: [name] is a 79 year old woman with nonischemic cardiomyopathy CRT-D that was implanted at [facility]  with class 2-3 congestive heart failure with left bundle branch block, coronary artery disease with stenting to her RCA, hyperlipidemia, hypertension  Previous history of bronchitis. She reports her husband passed away earlier in 04/02/2012. She presents for routine followup.  Recent  Cardiac catheterization in late 04/02/13 showed moderate proximal and distal RCA disease estimated at 60%. Also mild to moderate proximal to mid LAD disease. Ejection fraction estimated at greater than 55%. There is no significant change in her RCA lesions compared to 02-Apr-2008. There is mild progression of the distal RCA lesion from a mild to moderate stenosis. The lesions were discussed with interventional cardiology and medical management was recommended .  In followup today, she feels well. She is active with no complaints. She is uncertain if she is taking pravastatin. Previously this caused muscle or joint ache. He is having GERD symptoms. Eating chocolate late at night. Mild shortness of breath, stable  Previous  Echo April 2011 showing mild LV systolic dysfunction, ejection fraction 43%, mildly dilated left atrium, mild pulmonary hypertension with right ventricular systolic pressure of 33  Stress test April 2011 showing normal study with normal ejection fraction. Ejection fraction estimated at 79%. This was a lexiscan.  cardiac catheterization in January 2008 showing 40% restenosis of mid RCA stent and 50% mid LAD disease. Notes indicate 2 stents in her RCA.  EKG shows paced rhythm with rate 86 beats per minute     Outpatient Encounter Prescriptions as of 11/01/2013  Medication Sig  . Ascorbic Acid (VITAMIN C) 500 MG tablet Take 500 mg by mouth as needed.   Marland Kitchen aspirin 81 MG EC tablet Take 81 mg by mouth daily.    Marland Kitchen  CALCIUM-VITAMIN D-VITAMIN K PO Take by mouth. Takes 1 tablet daily.  . carvedilol (COREG) 6.25 MG tablet Take 1 tablet (6.25 mg total) by mouth 2 (two) times daily.  . clopidogrel (PLAVIX) 75 MG tablet Take 1 tablet (75 mg total) by mouth daily.  . ferrous sulfate 324 (65 FE) MG TBEC Take 324 mg by mouth daily as needed.  . furosemide (LASIX) 40 MG tablet Take 1 tablet (40 mg total) by mouth daily as needed.  Marland Kitchen levothyroxine (SYNTHROID, LEVOTHROID) 75 MCG tablet Take 75 mcg by mouth daily.  Marland Kitchen LORazepam (ATIVAN) 1 MG tablet Take 1 mg by mouth at bedtime as needed.    . pravastatin (PRAVACHOL) 10 MG tablet TAKE 1 TABLET BY MOUTH EVERY DAY  . pravastatin (PRAVACHOL) 10 MG tablet Take 1 tablet (10 mg total) by mouth daily.  . vitamin B-12 (CYANOCOBALAMIN) 1000 MCG tablet Take 1,000 mcg by mouth daily.      Review of Systems  Constitutional: Negative.   HENT: Negative.   Eyes: Negative.   Respiratory: Positive for shortness of breath.   Gastrointestinal: Negative.   Musculoskeletal: Positive for gait problem.  Skin: Negative.   Neurological: Negative.   Psychiatric/Behavioral: Negative.   All other systems reviewed and are negative.    BP 140/82  Pulse 86  Ht 5\' 3"  (1.6 m)  Wt 155 lb 4 oz (70.421 kg)  BMI 27.51 kg/m2  Physical Exam  Nursing note and vitals reviewed. Constitutional: She is oriented to person, place, and time. She appears well-developed and well-nourished.  HENT:  Head: Normocephalic.  Nose: Nose normal.  Mouth/Throat: Oropharynx is clear and moist.  Eyes: Conjunctivae are normal. Pupils are equal, round, and reactive to light.  Neck: Normal range of motion. Neck supple. No JVD present.  Cardiovascular: Normal rate, regular rhythm, S1 normal, S2 normal, normal heart sounds and intact distal pulses.  Exam reveals no gallop and no friction rub.   No murmur heard. Pulmonary/Chest: Effort normal and breath sounds normal. No respiratory distress. She has no wheezes.  She has no rales. She exhibits no tenderness.  Abdominal: Soft. Bowel sounds are normal. She exhibits no distension. There is no tenderness.  Musculoskeletal: Normal range of motion. She exhibits no edema and no tenderness.  Lymphadenopathy:    She has no cervical adenopathy.  Neurological: She is alert and oriented to person, place, and time. Coordination normal.  Skin: Skin is warm and dry. No rash noted. No erythema.  Psychiatric: She has a normal mood and affect. Her behavior is normal. Judgment and thought content normal.    Assessment and Plan

## 2013-11-01 NOTE — Patient Instructions (Addendum)
You are doing well. No medication changes were made.  For heartburn,  Take generic pepcid 1 or 2 pills at a time Ok to mix with tums  Last cholesterol was 215 June 2014 Goal is <150  Please call us if you have new issues that need to be addressed before your next appt.  Your physician wants you to follow-up in: 6 months.  You will receive a reminder letter in the mail two months in advance. If you don't receive a letter, please call our office to schedule the follow-up appointment.

## 2013-11-01 NOTE — Assessment & Plan Note (Signed)
Blood pressure is well controlled on today's visit. No changes made to the medications. 

## 2013-11-01 NOTE — Assessment & Plan Note (Signed)
Currently with no symptoms of angina. No further workup at this time. Continue current medication regimen. 

## 2013-11-07 ENCOUNTER — Ambulatory Visit (INDEPENDENT_AMBULATORY_CARE_PROVIDER_SITE_OTHER): Payer: Medicare Other | Admitting: *Deleted

## 2013-11-07 DIAGNOSIS — I5032 Chronic diastolic (congestive) heart failure: Secondary | ICD-10-CM

## 2013-11-07 DIAGNOSIS — I428 Other cardiomyopathies: Secondary | ICD-10-CM

## 2013-11-07 DIAGNOSIS — Z95810 Presence of automatic (implantable) cardiac defibrillator: Secondary | ICD-10-CM

## 2013-11-07 LAB — MDC_IDC_ENUM_SESS_TYPE_REMOTE
Battery Voltage: 2.78 V
Brady Statistic AP VP Percent: 0.02 %
Brady Statistic AP VS Percent: 0.01 %
Brady Statistic RA Percent Paced: 0.03 %
HighPow Impedance: 39 Ohm
HighPow Impedance: 47 Ohm
Lead Channel Impedance Value: 380 Ohm
Lead Channel Impedance Value: 551 Ohm
Lead Channel Impedance Value: 646 Ohm
Lead Channel Impedance Value: 779 Ohm
Lead Channel Pacing Threshold Amplitude: 0.625 V
Lead Channel Pacing Threshold Amplitude: 0.875 V
Lead Channel Pacing Threshold Pulse Width: 0.4 ms
Lead Channel Sensing Intrinsic Amplitude: 10.75 mV
Lead Channel Sensing Intrinsic Amplitude: 2 mV
Lead Channel Setting Pacing Amplitude: 1.5 V
Lead Channel Setting Pacing Amplitude: 2 V
Lead Channel Setting Pacing Amplitude: 2.5 V
Lead Channel Setting Pacing Pulse Width: 0.4 ms
Lead Channel Setting Pacing Pulse Width: 0.4 ms
Lead Channel Setting Sensing Sensitivity: 0.3 mV
MDC IDC MSMT LEADCHNL LV IMPEDANCE VALUE: 950 Ohm
MDC IDC MSMT LEADCHNL LV PACING THRESHOLD PULSEWIDTH: 0.4 ms
MDC IDC MSMT LEADCHNL RA PACING THRESHOLD AMPLITUDE: 0.5 V
MDC IDC MSMT LEADCHNL RA PACING THRESHOLD PULSEWIDTH: 0.4 ms
MDC IDC SESS DTM: 20141222083627
MDC IDC SET ZONE DETECTION INTERVAL: 350 ms
MDC IDC SET ZONE DETECTION INTERVAL: 370 ms
MDC IDC STAT BRADY AS VP PERCENT: 99.93 %
MDC IDC STAT BRADY AS VS PERCENT: 0.03 %
MDC IDC STAT BRADY RV PERCENT PACED: 99.96 %
Zone Setting Detection Interval: 300 ms
Zone Setting Detection Interval: 330 ms

## 2013-11-07 NOTE — Progress Notes (Signed)
EPIC Encounter for ICM Monitoring  Patient Name: Erica [name] is a 79 y.o. female Date: 11/07/2013 Primary Care Physican: [name], MD Primary Cardiologist: Mariah Milling Electrophysiologist: Candie Echevaria Weight: 154  lbs       In the past month, have you:  1. Gained more than 2 pounds in a day or more than 5 pounds in a week? no  2. Had changes in your medications (with verification of current medications)? No (takes lasix PRN only).  3. Had more shortness of breath than is usual for you? no  4. Limited your activity because of shortness of breath? no  5. Not been able to sleep because of shortness of breath? no  6. Had increased swelling in your feet or ankles? no  7. Had symptoms of dehydration (dizziness, dry mouth, increased thirst, decreased urine output) no  8. Had changes in sodium restriction? no  9. Been compliant with medication? Yes   ICM trend:  Follow-up plan: ICM clinic phone appointment in: 12/12/13  Copy of note sent to patient's primary care physician, primary cardiologist, and device following physician.  Sherri Rad, RN, BSN 11/07/2013 10:30 AM

## 2013-12-01 ENCOUNTER — Encounter: Payer: Self-pay | Admitting: *Deleted

## 2013-12-05 ENCOUNTER — Other Ambulatory Visit: Payer: Self-pay

## 2013-12-05 MED ORDER — CARVEDILOL 6.25 MG PO TABS: 6.2500 mg | ORAL_TABLET | Freq: Two times a day (BID) | ORAL | Status: DC

## 2013-12-05 NOTE — Telephone Encounter (Signed)
Refill sent for coreg 6.25 mg take one tablet twice a day.

## 2013-12-07 ENCOUNTER — Encounter: Payer: Self-pay | Admitting: Internal Medicine

## 2013-12-12 ENCOUNTER — Ambulatory Visit (INDEPENDENT_AMBULATORY_CARE_PROVIDER_SITE_OTHER): Payer: Medicare PPO | Admitting: *Deleted

## 2013-12-12 DIAGNOSIS — I5032 Chronic diastolic (congestive) heart failure: Secondary | ICD-10-CM

## 2013-12-12 DIAGNOSIS — Z95810 Presence of automatic (implantable) cardiac defibrillator: Secondary | ICD-10-CM

## 2013-12-12 NOTE — Progress Notes (Signed)
EPIC Encounter for ICM Monitoring  Patient Name: Erica [name] is a 80 y.o. female Date: 12/12/2013 Primary Care Physican: Debbora Dus, MD Primary Cardiologist: Rockey Situ Electrophysiologist: Faustino Congress Weight: 154 lbs       Bi-V pacing: 100 %  In the past month, have you:  1. Gained more than 2 pounds in a day or more than 5 pounds in a week? no  2. Had changes in your medications (with verification of current medications)? No (lasix is PRN)  3. Had more shortness of breath than is usual for you? Yes- The patient reports that she has been dealing with an URI and a UTI for the last 2 weeks. Her SOB is usually with activity. She noticed this yesterday after going out to eat with her daughter and going to a store. Her activity has been lower since being sick as well. She also reports [name] has told her her kidney function is low and this may be due to some dehydration she was experiencing. She has been drinking more fluids. She explained that blood was also noted in her urine. She states she already has had problems with anemia in the past.   4. Limited your activity because of shortness of breath? no  5. Not been able to sleep because of shortness of breath? no  6. Had increased swelling in your feet or ankles? no  7. Had symptoms of dehydration (dizziness, dry mouth, increased thirst, decreased urine output) no  8. Had changes in sodium restriction? no  9. Been compliant with medication? Yes   ICM trend:   Follow-up plan: ICM clinic phone appointment: 01/12/14.  I advised she continue PRN lasix only if her weights are up 3 or more lbs in 24 hours or edema is noted. She was made aware she could also just take a 1/2 tablet of lasix if needed for symptoms. She voices understanding. Patient advised to call with any questions or concerns.  Copy of note sent to patient's primary care physician, primary cardiologist, and device following physician.  Alvis Lemmings, RN,  BSN 12/12/2013 10:34 AM

## 2013-12-28 ENCOUNTER — Encounter: Payer: Self-pay | Admitting: Physician Assistant

## 2013-12-28 ENCOUNTER — Ambulatory Visit (INDEPENDENT_AMBULATORY_CARE_PROVIDER_SITE_OTHER): Payer: Medicare PPO | Admitting: Physician Assistant

## 2013-12-28 ENCOUNTER — Ambulatory Visit: Payer: Self-pay | Admitting: Physician Assistant

## 2013-12-28 VITALS — BP 122/80 | HR 83 | Ht 63.0 in | Wt 151.8 lb

## 2013-12-28 DIAGNOSIS — J209 Acute bronchitis, unspecified: Secondary | ICD-10-CM | POA: Insufficient documentation

## 2013-12-28 DIAGNOSIS — R0602 Shortness of breath: Secondary | ICD-10-CM

## 2013-12-28 DIAGNOSIS — I428 Other cardiomyopathies: Secondary | ICD-10-CM | POA: Insufficient documentation

## 2013-12-28 DIAGNOSIS — N390 Urinary tract infection, site not specified: Secondary | ICD-10-CM

## 2013-12-28 DIAGNOSIS — I10 Essential (primary) hypertension: Secondary | ICD-10-CM

## 2013-12-28 DIAGNOSIS — I2510 Atherosclerotic heart disease of native coronary artery without angina pectoris: Secondary | ICD-10-CM

## 2013-12-28 DIAGNOSIS — I951 Orthostatic hypotension: Secondary | ICD-10-CM

## 2013-12-28 LAB — CBC WITH DIFFERENTIAL/PLATELET
BASOS PCT: 0.4 %
Basophil #: 0 10*3/uL (ref 0.0–0.1)
EOS ABS: 0.3 10*3/uL (ref 0.0–0.7)
EOS PCT: 4.8 %
HCT: 31.4 % — ABNORMAL LOW (ref 35.0–47.0)
HGB: 10.8 g/dL — ABNORMAL LOW (ref 12.0–16.0)
LYMPHS ABS: 1.8 10*3/uL (ref 1.0–3.6)
Lymphocyte %: 31.9 %
MCH: 31.2 pg (ref 26.0–34.0)
MCHC: 34.2 g/dL (ref 32.0–36.0)
MCV: 91 fL (ref 80–100)
MONO ABS: 0.4 x10 3/mm (ref 0.2–0.9)
Monocyte %: 7.3 %
NEUTROS ABS: 3.2 10*3/uL (ref 1.4–6.5)
Neutrophil %: 55.6 %
PLATELETS: 181 10*3/uL (ref 150–440)
RBC: 3.45 10*6/uL — ABNORMAL LOW (ref 3.80–5.20)
RDW: 15.8 % — ABNORMAL HIGH (ref 11.5–14.5)
WBC: 5.7 10*3/uL (ref 3.6–11.0)

## 2013-12-28 LAB — PRO B NATRIURETIC PEPTIDE: B-Type Natriuretic Peptide: 1242 pg/mL — ABNORMAL HIGH (ref 0–450)

## 2013-12-28 LAB — TSH: THYROID STIMULATING HORM: 4.47 u[IU]/mL

## 2013-12-28 MED ORDER — BENZONATATE 100 MG PO CAPS: 100.0000 mg | ORAL_CAPSULE | Freq: Three times a day (TID) | ORAL | Status: DC | PRN

## 2013-12-28 MED ORDER — ALBUTEROL SULFATE HFA 108 (90 BASE) MCG/ACT IN AERS: 2.0000 | INHALATION_SPRAY | Freq: Four times a day (QID) | RESPIRATORY_TRACT | Status: DC | PRN

## 2013-12-28 MED ORDER — DM-GUAIFENESIN ER 30-600 MG PO TB12: 1.0000 | ORAL_TABLET | Freq: Two times a day (BID) | ORAL | Status: DC | PRN

## 2013-12-28 MED ORDER — METOPROLOL SUCCINATE ER 25 MG PO TB24: 12.5000 mg | ORAL_TABLET | Freq: Every day | ORAL | Status: DC

## 2013-12-28 NOTE — Patient Instructions (Addendum)
We will check lab work today- blood counts, thyroid and heart failure marker.   We have ordered a chest x-ray to be completed across the street.  Please switch carvedilol for metoprolol succinate as prescribed. Please increase fluid intake (no more than 2 liters per day, prefer gatorade, water over soda, tea) and small amounts of salt intake. If dizziness continues, please wear compression stockings (support hose).   Please take albuterol (for shortness of breath, wheezing), Mucinex (for cough) and Tessalon Perles (for sore throat) as needed.  If shortness of breath when exerting yourself continues, please call our office to consider a stress test. We will see you back for follow-up in 3 months otherwise.

## 2013-12-28 NOTE — Assessment & Plan Note (Signed)
Stable, no anginal symptoms. Continue current medications.

## 2013-12-28 NOTE — Progress Notes (Addendum)
Patient ID: Erica [name], female   DOB: [birth year], 80 y.o.   MRN: [id number]   Date:  12/28/2013   ID:  Erica [name], DOB [birth year], MRN [id number]  PCP:  Debbora Dus, MD  Primary Cardiologist:  Johnny Bridge, MD Primary Electrophysiologist:  Olin Pia, MD    History of Present Illness:  Erica [name] is a 80 y.o. female 80yo female w/ PMHx s/f NICM s/p Medtronic CRT-D, CAD, chronic combined CHF (NYHA class II-III at baseline), chronic LBBB, hypothyroidism, HLD, HTN and COPD who presents today c/o worsening SOB and fatigue.  R/LHC 04/2013: 60% pRCA, 60% dRCA, 40% pLAD, 60% pRCA ISR (unchanged from 2009), 60% dRCA; EF >55%. Medical management recommended.   Last seen in follow-up 10/2013. Stable overall from a CHF and CAD standpoint. She is followed by EP for CRT-D device. ICM clinic phone appointment 12/12/13- pt endorsed increased dyspnea w/o weight gain or swelling. Had a recent URI and UTI. Did note increased dyspnea after going out to eat and walking around a store. Advised to continue PRN Lasix for weight gain or swelling. She has renal insufficiency, referred to nephrology by PCP (to see tomorrow).  She reports experiencing increased shortness of breath, fatigue, cough productive of white sputum, sore throat, nasal congestion and hoarseness for the past 1-2 weeks. She reports having a "cold" last month. Not diagnosed w/ flu. Denies weight gain or LE edema. She does note DOE. Son reports she was speaking in short sentences yesterday. No unilateral leg swelling, redness or tenderness.   She has also been experiencing positional lightheadedness, presyncope when bending over during the past 2-3 days. She reports regular fluid intake- son states she drinks a lot of soda. Has not been requiring Lasix.  ORTHOSTATICS VS  Lying: 162/87, 82 bpm  Sitting: 152/86, 84 bpm Standing: 132/82, 114/71, 118/77; 85-86 bpm   EKG: A-sensed, V-paced rhythm, 83 bpm   Wt Readings from Last 3 Encounters:    12/28/13 151 lb 12 oz (68.833 kg)  11/01/13 155 lb 4 oz (70.421 kg)  05/24/13 158 lb 8 oz (71.895 kg)     Past Medical History  Diagnosis Date  . Nonischemic cardiomyopathy     s/p Medtronic CRT-D, NYHA II-III at baseline  . Coronary artery disease     a. s/p prior stent-RCA b. borderline CAD on 04/2013 cath  . History of MI (myocardial infarction)   . Dyslipidemia   . Hypertension   . COPD (chronic obstructive pulmonary disease)   . Breast cancer   . Hypothyroidism   . Basal cell carcinoma 2011  . Respiratory infection     Current Outpatient Prescriptions  Medication Sig Dispense Refill  . Ascorbic Acid (VITAMIN C) 500 MG tablet Take 500 mg by mouth as needed.       Marland Kitchen aspirin 81 MG EC tablet Take 81 mg by mouth daily.        Marland Kitchen CALCIUM-VITAMIN D-VITAMIN K PO Take by mouth. Takes 1 tablet daily.      . carvedilol (COREG) 6.25 MG tablet Take 1 tablet (6.25 mg total) by mouth 2 (two) times daily.  90 tablet  3  . clopidogrel (PLAVIX) 75 MG tablet Take 1 tablet (75 mg total) by mouth daily.  90 tablet  3  . ferrous sulfate 324 (65 FE) MG TBEC Take 324 mg by mouth daily as needed.      . furosemide (LASIX) 40 MG tablet Take 1 tablet (40 mg total) by mouth daily as needed.  90 tablet  3  . levothyroxine (SYNTHROID, LEVOTHROID) 75 MCG tablet Take 75 mcg by mouth daily.      Marland Kitchen LORazepam (ATIVAN) 1 MG tablet Take 1 mg by mouth at bedtime as needed.        . pravastatin (PRAVACHOL) 10 MG tablet TAKE 1 TABLET BY MOUTH EVERY DAY  90 tablet  0  . vitamin B-12 (CYANOCOBALAMIN) 1000 MCG tablet Take 1,000 mcg by mouth daily.         No current facility-administered medications for this visit.    Allergies:    Allergies  Allergen Reactions  . Ace Inhibitors   . Amlodipine-Atorvastatin   . Ciprofloxacin   . Quinolones     Social History:  The patient  reports that she has never smoked. She has never used smokeless tobacco. She reports that she does not drink alcohol or use illicit  drugs.   Family History:  Family History  Problem Relation Age of Onset  . Cancer Other   . Coronary artery disease Other   . Hypertension Other   . Hypertension Mother   . Hypertension Father     Review of Systems: General:  negative for chills, fever, night sweats or weight changes.  Cardiovascular: positive for shortness of breath, negative for chest pain, edema, orthopnea, palpitations, paroxysmal nocturnal dyspnea  Dermatological: negative for rash Respiratory: positive for cough Urologic: negative for hematuria Abdominal: negative for nausea, vomiting, diarrhea, bright red blood per rectum, melena, or hematemesis Neurologic: positive for lightheadedness, negative for visual changes, syncope All other systems reviewed and are otherwise negative except as noted above.  PHYSICAL EXAM: VS:  BP 114/71  Pulse 85  Ht 5\' 3"  (1.6 m)  Wt 151 lb 12 oz (68.833 kg)  BMI 26.89 kg/m2 Well nourished, well developed, in no acute distress HEENT: normal, PERRL Neck: no JVD or bruits Cardiac:  normal S1, S2; RRR; no murmur or gallops Lungs:  clear to auscultation bilaterally, localized wheeze with rhonchi in L upper posterior lung field, cleared with cough, bibasilar rales, no wheezes Abd: soft, nontender, no hepatomegaly, normoactive BS x 4 quads Ext: no edema, cyanosis or clubbing Skin: warm and dry, cap refill < 2 sec Neuro:  CNs 2-12 intact, no focal abnormalities noted Musculoskeletal: strength and tone appropriate for age  Psych: normal affect

## 2013-12-28 NOTE — Assessment & Plan Note (Signed)
The patient acquired a viral-type illness last month. She reports nasal congestion, post nasal drip, hoarseness, cough productive of white sputum and fatigue. She has fine wheeze and rhonchi on exam which cleared with cough. Will check CXR, add albuterol, Mucinex and Scientist, forensic. Check CBC, TSH and BNP. Low suspicion for decompensated CHF- no peripheral edema on exam, no JVD. Bibasilar rales on exam, more course sounding, question atelectasis. Weight has trended down. Actually suspect some dehydration with orthostasis.

## 2013-12-28 NOTE — Assessment & Plan Note (Addendum)
Euvolemic on exam. Weight trending down. Suspect patient may be dehydrated given orthostasis and in the setting of bronchitis. Has not required Lasix. No weight increase or LE edema. Will switch Coreg to Toprol-XL. Hold on ACEi/ARB/spiro with renal insufficiency (patient to see nephrology tomorrow).

## 2013-12-28 NOTE — Assessment & Plan Note (Addendum)
Confirmed on orthostatic VS and explaining positional lightheadedness. Advised to gently hydrate, small amounts of salt intake with weight and symptom monitoring. Will switch carvedilol (alpha activity) to low-dose metoprolol succinate. Advised to hold Lasix for the next few days. Weight trend is declining, question dehydration in the setting of acute bronchitis. Recommended TED hose if persists.

## 2014-01-03 ENCOUNTER — Other Ambulatory Visit: Payer: Self-pay

## 2014-01-03 DIAGNOSIS — Z1231 Encounter for screening mammogram for malignant neoplasm of breast: Secondary | ICD-10-CM

## 2014-01-12 ENCOUNTER — Encounter: Payer: Medicare Other | Admitting: *Deleted

## 2014-01-17 ENCOUNTER — Telehealth: Payer: Self-pay

## 2014-01-17 NOTE — Telephone Encounter (Signed)
Left message for pt to call back  °

## 2014-01-17 NOTE — Telephone Encounter (Signed)
Pt would like chest xray results. Also states she is not been feeling very well. States she is still nauseated and dizzy, states BP is ok. Thinks it may be her medication. States she will be leaving at 3:15 if someone can call her then.

## 2014-01-18 ENCOUNTER — Ambulatory Visit (INDEPENDENT_AMBULATORY_CARE_PROVIDER_SITE_OTHER): Payer: Commercial Managed Care - HMO | Admitting: Cardiovascular Disease

## 2014-01-18 ENCOUNTER — Encounter: Payer: Self-pay | Admitting: Cardiovascular Disease

## 2014-01-18 VITALS — BP 140/70 | HR 85 | Ht 63.0 in | Wt 150.5 lb

## 2014-01-18 DIAGNOSIS — R05 Cough: Secondary | ICD-10-CM | POA: Insufficient documentation

## 2014-01-18 DIAGNOSIS — R059 Cough, unspecified: Secondary | ICD-10-CM

## 2014-01-18 DIAGNOSIS — R5383 Other fatigue: Secondary | ICD-10-CM

## 2014-01-18 DIAGNOSIS — I5032 Chronic diastolic (congestive) heart failure: Secondary | ICD-10-CM

## 2014-01-18 DIAGNOSIS — R531 Weakness: Secondary | ICD-10-CM

## 2014-01-18 DIAGNOSIS — I10 Essential (primary) hypertension: Secondary | ICD-10-CM

## 2014-01-18 DIAGNOSIS — R5381 Other malaise: Secondary | ICD-10-CM

## 2014-01-18 DIAGNOSIS — I2510 Atherosclerotic heart disease of native coronary artery without angina pectoris: Secondary | ICD-10-CM

## 2014-01-18 MED ORDER — OMEPRAZOLE 20 MG PO CPDR: 20.0000 mg | DELAYED_RELEASE_CAPSULE | Freq: Two times a day (BID) | ORAL | Status: DC

## 2014-01-18 NOTE — Telephone Encounter (Signed)
Spoke w/ pt.  She requested results of chest x-ray and labs done at last ov. Pt reports that she is still experiencing the same symptoms and would like to see Dr. Rockey Situ. Pt sched to see Dr. Rockey Situ this afternoon at 3:15.

## 2014-01-18 NOTE — Assessment & Plan Note (Signed)
She appears to be relatively euvolemic. Encouraged her to stay on her Lasix as needed

## 2014-01-18 NOTE — Assessment & Plan Note (Signed)
Etiology of her weakness is concerning for general deconditioning. No significant activity since October 2014 per the patient and her family. We have recommended she participated in cardiac rehabilitation. She does have underlying CAD, history of CHF

## 2014-01-18 NOTE — Assessment & Plan Note (Signed)
Blood pressure is well controlled on today's visit. No changes made to the medications. 

## 2014-01-18 NOTE — Progress Notes (Signed)
Patient ID: Erica [name], female    DOB: 11-20-1930, 80 y.o.   MRN: [id number]  HPI Comments: Erica [name] is a 80 year old woman with nonischemic cardiomyopathy CRT-D that was implanted at [facility]  with class 2-3 congestive heart failure with left bundle branch block, coronary artery disease with stenting to her RCA, hyperlipidemia, hypertension  Previous history of bronchitis. her husband passed away earlier in Mar 14, 2012. She presents for routine followup.   Cardiac catheterization in late 03/14/2013 showed moderate proximal and distal RCA disease estimated at 60%. Also mild to moderate proximal to mid LAD disease. Ejection fraction estimated at greater than 55%. There is no significant change in her RCA lesions compared to 03/14/2008.  distal RCA lesion with moderate stenosis. The lesions were discussed with interventional cardiology and medical management was recommended .  In followup today, she reports that she feels tired. Her legs are tired, she is short of breath with exertion, feels weak all over. She has been sedentary since October 2014. She's not go out of the house much, overall feels weak from inactivity. Breathing is not particularly worse, just her leg strength and overall conditioning. She lives with family. He reports that she is very sedentary. She denies any leg edema, no PND or orthopnea. She does have a chronic cough, also with heartburn symptoms  Previous  Echo 14-Mar-2010 showing mild LV systolic dysfunction, ejection fraction 43%, mildly dilated left atrium, mild pulmonary hypertension with right ventricular systolic pressure of 33  Stress test 2010-03-14 showing normal study with normal ejection fraction. Ejection fraction estimated at 79%. This was a lexiscan.  cardiac catheterization in January 2008 showing 40% restenosis of mid RCA stent and 50% mid LAD disease. Notes indicate 2 stents in her RCA.  EKG shows paced rhythm with rate 85 beats per minute Labs from 12/28/2013 TSH 4.47, BNP  1200 Chest x-ray with bilateral chronic interstitial disease     Outpatient Encounter Prescriptions as of 01/18/2014  Medication Sig  . albuterol (PROVENTIL HFA;VENTOLIN HFA) 108 (90 BASE) MCG/ACT inhaler Inhale 2 puffs into the lungs every 6 (six) hours as needed for wheezing or shortness of breath.  . Ascorbic Acid (VITAMIN C) 500 MG tablet Take 500 mg by mouth as needed.   Marland Kitchen aspirin 81 MG EC tablet Take 81 mg by mouth daily.    Marland Kitchen CALCIUM-VITAMIN D-VITAMIN K PO Take by mouth. Takes 1 tablet daily.  . clopidogrel (PLAVIX) 75 MG tablet Take 1 tablet (75 mg total) by mouth daily.  . ferrous sulfate 324 (65 FE) MG TBEC Take 324 mg by mouth daily as needed.  . furosemide (LASIX) 40 MG tablet Take 1 tablet (40 mg total) by mouth daily as needed.  Marland Kitchen levothyroxine (SYNTHROID, LEVOTHROID) 75 MCG tablet Take 75 mcg by mouth daily.  Marland Kitchen LORazepam (ATIVAN) 1 MG tablet Take 1 mg by mouth at bedtime as needed.    . metoprolol succinate (TOPROL XL) 25 MG 24 hr tablet Take 0.5 tablets (12.5 mg total) by mouth daily.  . pravastatin (PRAVACHOL) 10 MG tablet TAKE 1 TABLET BY MOUTH EVERY DAY  . vitamin B-12 (CYANOCOBALAMIN) 1000 MCG tablet Take 1,000 mcg by mouth daily.     Review of Systems  Constitutional: Negative.   HENT: Negative.   Eyes: Negative.   Respiratory: Positive for shortness of breath.   Gastrointestinal: Negative.   Musculoskeletal: Positive for gait problem.  Skin: Negative.   Neurological: Negative.   Psychiatric/Behavioral: Negative.   All other systems reviewed and  are negative.    BP 140/70  Pulse 85  Ht 5\' 3"  (1.6 m)  Wt 150 lb 8 oz (68.266 kg)  BMI 26.67 kg/m2  Physical Exam  Nursing note and vitals reviewed. Constitutional: She is oriented to person, place, and time. She appears well-developed and well-nourished.  HENT:  Head: Normocephalic.  Nose: Nose normal.  Mouth/Throat: Oropharynx is clear and moist.  Eyes: Conjunctivae are normal. Pupils are equal, round, and  reactive to light.  Neck: Normal range of motion. Neck supple. No JVD present.  Cardiovascular: Normal rate, regular rhythm, S1 normal, S2 normal, normal heart sounds and intact distal pulses.  Exam reveals no gallop and no friction rub.   No murmur heard. Pulmonary/Chest: Effort normal and breath sounds normal. No respiratory distress. She has no wheezes. She has no rales. She exhibits no tenderness.  Crackles at the bases  Abdominal: Soft. Bowel sounds are normal. She exhibits no distension. There is no tenderness.  Musculoskeletal: Normal range of motion. She exhibits no edema and no tenderness.  Lymphadenopathy:    She has no cervical adenopathy.  Neurological: She is [name] and oriented to person, place, and time. Coordination normal.  Skin: Skin is warm and dry. No rash noted. No erythema.  Psychiatric: She has a normal mood and affect. Her behavior is normal. Judgment and thought content normal.    Assessment and Plan

## 2014-01-18 NOTE — Assessment & Plan Note (Signed)
Currently with no symptoms of angina. No further workup at this time. Continue current medication regimen. 

## 2014-01-18 NOTE — Patient Instructions (Addendum)
You are doing well.  We would recommend cardiac rehab  Start omeprazole one a day for acid/heart burn  Please call us if you have new issues that need to be addressed before your next appt.  Your physician wants you to follow-up in: 6 months.  You will receive a reminder letter in the mail two months in advance. If you don't receive a letter, please call our office to schedule the follow-up appointment.

## 2014-01-18 NOTE — Assessment & Plan Note (Signed)
I suspect her cough could be secondary to GERD symptoms. Recommended she start omeprazole once or twice daily for symptom relief

## 2014-01-23 ENCOUNTER — Encounter: Payer: Self-pay | Admitting: *Deleted

## 2014-01-24 ENCOUNTER — Encounter: Payer: Self-pay | Admitting: *Deleted

## 2014-01-26 ENCOUNTER — Ambulatory Visit: Payer: Self-pay | Admitting: Nephrology

## 2014-01-26 ENCOUNTER — Telehealth: Payer: Self-pay | Admitting: *Deleted

## 2014-01-26 NOTE — Telephone Encounter (Signed)
Spoke w/ [name].  Advised her of Dr. Donivan Scull recommendation. She is agreeable to this and will call w/ further questions or concerns.

## 2014-01-26 NOTE — Telephone Encounter (Signed)
Spoke w/ [name], pt's daughter, as pt is HOH and it is difficult for her to understand over the phone.  She reports that since starting metoprolol at ov w/ Nicole Kindred on 12/28/13, pt has had a "chronic cough". Reports dry, nonproductive cough that is similar to when pt previously took lisinopril. Pt did not take metoprolol yesterday and did not cough as bad. Daughter would like to know if pt should d/c this and go back to carvedilol 6.25mg , though she realizes that was dropping her BP too low or if Dr. Rockey Situ would like for her to try something else.  Please advise.  Thank you.

## 2014-01-26 NOTE — Telephone Encounter (Signed)
Left message for Lori to call back.

## 2014-01-26 NOTE — Telephone Encounter (Signed)
Okay to change back to carvedilol as 6.25 mg twice a day If blood pressure runs low, could cut the dose in half bid

## 2014-01-26 NOTE — Telephone Encounter (Signed)
Patient's daughter called. She has some medications questions. Patient has a chronic cough from rx Metoroprolol (not sure) may need to change medication

## 2014-02-01 ENCOUNTER — Telehealth: Payer: Self-pay

## 2014-02-01 NOTE — Telephone Encounter (Signed)
Faxed referral to [facility] at [phone]. Previously sent referral to cardiac rehab, but after reviewing paperwork that was sent over, pt was determined to need pulmonary rehab.

## 2014-02-07 ENCOUNTER — Encounter: Payer: Self-pay | Admitting: Cardiovascular Disease

## 2014-02-08 ENCOUNTER — Ambulatory Visit (INDEPENDENT_AMBULATORY_CARE_PROVIDER_SITE_OTHER): Payer: Medicare PPO | Admitting: *Deleted

## 2014-02-08 DIAGNOSIS — I428 Other cardiomyopathies: Secondary | ICD-10-CM

## 2014-02-08 DIAGNOSIS — I5032 Chronic diastolic (congestive) heart failure: Secondary | ICD-10-CM

## 2014-02-09 ENCOUNTER — Other Ambulatory Visit: Payer: Self-pay | Admitting: *Deleted

## 2014-02-09 MED ORDER — FUROSEMIDE 40 MG PO TABS: 40.0000 mg | ORAL_TABLET | Freq: Every day | ORAL | Status: DC | PRN

## 2014-02-13 ENCOUNTER — Telehealth: Payer: Self-pay | Admitting: *Deleted

## 2014-02-13 ENCOUNTER — Encounter: Payer: Self-pay | Admitting: *Deleted

## 2014-02-13 NOTE — Telephone Encounter (Signed)
I spoke with the patient regarding her ICM readings from 02/08/14. I advised her fluid readings look good. I will follow back up with her on 03/16/14. She states her weights have been steady at 151 lbs. She is asymptomatic. She has started cardiac rehab and feels this is helping her overall feel better.

## 2014-02-15 ENCOUNTER — Encounter: Payer: Self-pay | Admitting: Cardiovascular Disease

## 2014-02-23 ENCOUNTER — Ambulatory Visit
Admission: RE | Admit: 2014-02-23 | Discharge: 2014-02-23 | Disposition: A | Discharge status: Home / Self Care | Payer: Commercial Managed Care - HMO | Source: Ambulatory Visit

## 2014-02-23 DIAGNOSIS — Z1231 Encounter for screening mammogram for malignant neoplasm of breast: Secondary | ICD-10-CM

## 2014-03-16 ENCOUNTER — Encounter: Payer: Self-pay | Admitting: *Deleted

## 2014-03-16 ENCOUNTER — Ambulatory Visit (INDEPENDENT_AMBULATORY_CARE_PROVIDER_SITE_OTHER): Payer: Medicare PPO | Admitting: *Deleted

## 2014-03-16 DIAGNOSIS — I5032 Chronic diastolic (congestive) heart failure: Secondary | ICD-10-CM

## 2014-03-16 DIAGNOSIS — Z95810 Presence of automatic (implantable) cardiac defibrillator: Secondary | ICD-10-CM

## 2014-03-16 NOTE — Progress Notes (Signed)
EPIC Encounter for ICM Monitoring  Patient Name: Erica [name] is a 80 y.o. female Date: 03/16/2014 Primary Care Physican: Debbora Dus, MD Primary Cardiologist: Rockey Situ Electrophysiologist: Faustino Congress Weight: 149 lbs  Bi-V pacing: 99.9 %       In the past month, have you:  1. Gained more than 2 pounds in a day or more than 5 pounds in a week? No. The patient states she has been going to [facility] and has been losing a little bit of weight.   2. Had changes in your medications (with verification of current medications)? No. The patient does take lasix PRN if her weights exceed 2-3 lbs above her baseline.  3. Had more shortness of breath than is usual for you? no  4. Limited your activity because of shortness of breath? no  5. Not been able to sleep because of shortness of breath? no  6. Had increased swelling in your feet or ankles? no  7. Had symptoms of dehydration (dizziness, dry mouth, increased thirst, decreased urine output) no  8. Had changes in sodium restriction? No. She does admit to dietary indiscretion on occasion.  9. Been compliant with medication? Yes   ICM trend:   Follow-up plan: ICM clinic phone appointment: 04/17/14. The patient has routine follow up scheduled with Dr. Rockey Situ on 03/30/14.  Copy of note sent to patient's primary care physician, primary cardiologist, and device following physician.  Emily Filbert, RN, BSN 03/16/2014 12:19 PM

## 2014-03-17 ENCOUNTER — Encounter: Payer: Self-pay | Admitting: Cardiovascular Disease

## 2014-03-30 ENCOUNTER — Ambulatory Visit: Payer: Medicare PPO | Admitting: Cardiovascular Disease

## 2014-03-31 ENCOUNTER — Encounter: Payer: Self-pay | Admitting: Internal Medicine

## 2014-04-17 ENCOUNTER — Encounter: Payer: Commercial Managed Care - HMO | Admitting: *Deleted

## 2014-04-17 ENCOUNTER — Telehealth: Payer: Self-pay | Admitting: *Deleted

## 2014-04-17 NOTE — Telephone Encounter (Signed)
I left a message for the patient to call regarding ICM transmission. 

## 2014-04-18 NOTE — Telephone Encounter (Signed)
Follow up     Returning Heather's call from yesterday

## 2014-04-20 NOTE — Telephone Encounter (Signed)
I left a message for the patient to call. 

## 2014-05-05 NOTE — Telephone Encounter (Signed)
This encounter was created in error - please disregard.

## 2014-05-12 ENCOUNTER — Ambulatory Visit: Payer: Medicare PPO | Admitting: Cardiovascular Disease

## 2014-05-12 ENCOUNTER — Encounter: Payer: Self-pay | Admitting: Cardiovascular Disease

## 2014-05-12 ENCOUNTER — Ambulatory Visit (INDEPENDENT_AMBULATORY_CARE_PROVIDER_SITE_OTHER): Payer: Commercial Managed Care - HMO | Admitting: Cardiovascular Disease

## 2014-05-12 VITALS — BP 110/60 | HR 77 | Ht 62.0 in | Wt 149.2 lb

## 2014-05-12 DIAGNOSIS — I5032 Chronic diastolic (congestive) heart failure: Secondary | ICD-10-CM

## 2014-05-12 DIAGNOSIS — R531 Weakness: Secondary | ICD-10-CM

## 2014-05-12 DIAGNOSIS — I2510 Atherosclerotic heart disease of native coronary artery without angina pectoris: Secondary | ICD-10-CM

## 2014-05-12 DIAGNOSIS — R5381 Other malaise: Secondary | ICD-10-CM

## 2014-05-12 DIAGNOSIS — I10 Essential (primary) hypertension: Secondary | ICD-10-CM

## 2014-05-12 DIAGNOSIS — R5383 Other fatigue: Secondary | ICD-10-CM

## 2014-05-12 NOTE — Assessment & Plan Note (Signed)
She appears relatively euvolemic. Only takes Lasix periodically. No changes to her medications

## 2014-05-12 NOTE — Assessment & Plan Note (Signed)
Blood pressure is well controlled on today's visit. No changes made to the medications. 

## 2014-05-12 NOTE — Assessment & Plan Note (Signed)
Currently with no symptoms of angina. No further workup at this time. Continue current medication regimen. 

## 2014-05-12 NOTE — Patient Instructions (Signed)
You are doing well. No medication changes were made.  Please call us if you have new issues that need to be addressed before your next appt.  Your physician wants you to follow-up in: 6 months.  You will receive a reminder letter in the mail two months in advance. If you don't receive a letter, please call our office to schedule the follow-up appointment.   

## 2014-05-12 NOTE — Progress Notes (Signed)
Patient ID: Erica [name], female    DOB: 10/08/1931, 80 y.o.   MRN: [id number]  HPI Comments: 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 her husband passed away earlier in 2012/03/10. She presents for routine followup.  In followup today she reports that she is doing well. She does have some balance issues. She goes to the gym, does class is 3 days per week. Takes Lasix rarely, possibly 2 times per month. He does not drink much fluids. Denies any significant shortness of breath, leg edema, near syncope or syncope. No significant chest pain with exertion. She feels that her previous problems were secondary to leg fatigue and her being sedentary. Pravastatin previously stopped secondary to muscle ache, lightheadedness. She does continue to have significant leg cramping, worse after she does her exercise class.   Cardiac catheterization in late 10-Mar-2013 showed moderate proximal and distal RCA disease estimated at 60%. Also mild to moderate proximal to mid LAD disease. Ejection fraction estimated at greater than 55%. There is no significant change in her RCA lesions compared to 03/10/08.  distal RCA lesion with moderate stenosis. The lesions were discussed with interventional cardiology and medical management was recommended .  Previous  Echo Mar 10, 2010 showing mild LV systolic dysfunction, ejection fraction 43%, mildly dilated left atrium, mild pulmonary hypertension with right ventricular systolic pressure of 33  Stress test 10-Mar-2010 showing normal study with normal ejection fraction. Ejection fraction estimated at 79%. This was a lexiscan.  cardiac catheterization in January 2008 showing 40% restenosis of mid RCA stent and 50% mid LAD disease. Notes indicate 2 stents in her  RCA.  EKG shows paced rhythm with rate 77 beats per minute Labs from 12/28/2013 TSH 4.47, BNP 1200 Chest x-ray with bilateral chronic interstitial disease     Outpatient Encounter Prescriptions as of 05/12/2014  Medication Sig  . albuterol (PROVENTIL HFA;VENTOLIN HFA) 108 (90 BASE) MCG/ACT inhaler Inhale 2 puffs into the lungs every 6 (six) hours as needed for wheezing or shortness of breath.  . Ascorbic Acid (VITAMIN C) 500 MG tablet Take 500 mg by mouth as needed.   Marland Kitchen aspirin 81 MG EC tablet Take 81 mg by mouth daily.    Marland Kitchen CALCIUM-VITAMIN D-VITAMIN K PO Take by mouth. Takes 1 tablet daily.  . clopidogrel (PLAVIX) 75 MG tablet Take 1 tablet (75 mg total) by mouth daily.  . ergocalciferol (VITAMIN D2) 50000 UNITS capsule Take 50,000 Units by mouth once a week.  . ferrous sulfate 324 (65 FE) MG TBEC Take 324 mg by mouth daily as needed.  . furosemide (LASIX) 40 MG tablet Take 1 tablet (40 mg total) by mouth daily as needed.  Marland Kitchen levothyroxine (SYNTHROID, LEVOTHROID) 75 MCG tablet Take 75 mcg by mouth daily.  Marland Kitchen LORazepam (ATIVAN) 1 MG tablet Take 1 mg by mouth at bedtime as needed.    . metoprolol succinate (TOPROL XL) 25 MG 24 hr tablet Take 0.5 tablets (12.5 mg total) by mouth daily.  Marland Kitchen omeprazole (PRILOSEC) 20 MG capsule Take 1 capsule (20 mg total) by mouth 2 (two) times daily before a meal.  . vitamin B-12 (CYANOCOBALAMIN) 1000 MCG tablet Take 1,000 mcg by mouth daily.    . [DISCONTINUED] pravastatin (PRAVACHOL) 10 MG tablet TAKE 1 TABLET BY MOUTH EVERY DAY  Review of Systems  Constitutional: Negative.   HENT: Negative.   Eyes: Negative.   Respiratory: Negative.   Cardiovascular: Negative.   Gastrointestinal: Negative.   Endocrine: Negative.   Musculoskeletal: Positive for gait problem.  Skin: Negative.   Allergic/Immunologic: Negative.   Neurological: Negative.   Psychiatric/Behavioral: Negative.   All other systems reviewed and are negative.   BP 110/60  Pulse 77  Ht  5\' 2"  (1.575 m)  Wt 149 lb 4 oz (67.699 kg)  BMI 27.29 kg/m2  Physical Exam  Nursing note and vitals reviewed. Constitutional: She is oriented to person, place, and time. She appears well-developed and well-nourished.  HENT:  Head: Normocephalic.  Nose: Nose normal.  Mouth/Throat: Oropharynx is clear and moist.  Eyes: Conjunctivae are normal. Pupils are equal, round, and reactive to light.  Neck: Normal range of motion. Neck supple. No JVD present.  Cardiovascular: Normal rate, regular rhythm, S1 normal, S2 normal, normal heart sounds and intact distal pulses.  Exam reveals no gallop and no friction rub.   No murmur heard. Pulmonary/Chest: Effort normal and breath sounds normal. No respiratory distress. She has no wheezes. She has no rales. She exhibits no tenderness.  Crackles at the bases  Abdominal: Soft. Bowel sounds are normal. She exhibits no distension. There is no tenderness.  Musculoskeletal: Normal range of motion. She exhibits no edema and no tenderness.  Lymphadenopathy:    She has no cervical adenopathy.  Neurological: She is [name] and oriented to person, place, and time. Coordination normal.  Skin: Skin is warm and dry. No rash noted. No erythema.  Psychiatric: She has a normal mood and affect. Her behavior is normal. Judgment and thought content normal.    Assessment and Plan

## 2014-05-12 NOTE — Assessment & Plan Note (Signed)
Weakness has significantly improved with exercise classes 3 days per week. Overall feeling stronger. Still with a balance issue. Recommended she use her cane

## 2014-06-05 ENCOUNTER — Encounter: Payer: Self-pay | Admitting: *Deleted

## 2014-06-05 NOTE — Telephone Encounter (Signed)
Letter mailed to patient.

## 2014-06-22 ENCOUNTER — Encounter: Payer: Commercial Managed Care - HMO | Admitting: *Deleted

## 2014-06-25 ENCOUNTER — Other Ambulatory Visit: Payer: Self-pay | Admitting: Cardiovascular Disease

## 2014-06-27 ENCOUNTER — Ambulatory Visit (INDEPENDENT_AMBULATORY_CARE_PROVIDER_SITE_OTHER): Payer: Commercial Managed Care - HMO | Admitting: *Deleted

## 2014-06-27 DIAGNOSIS — I428 Other cardiomyopathies: Secondary | ICD-10-CM

## 2014-06-27 LAB — MDC_IDC_ENUM_SESS_TYPE_REMOTE
Brady Statistic AP VP Percent: 0.02 %
Brady Statistic AP VS Percent: 0.01 %
Brady Statistic AS VP Percent: 99.94 %
Brady Statistic AS VS Percent: 0.03 %
Brady Statistic RA Percent Paced: 0.03 %
Date Time Interrogation Session: 20150811142201
HighPow Impedance: 36 Ohm
HighPow Impedance: 44 Ohm
Lead Channel Impedance Value: 380 Ohm
Lead Channel Impedance Value: 437 Ohm
Lead Channel Impedance Value: 494 Ohm
Lead Channel Impedance Value: 646 Ohm
Lead Channel Impedance Value: 779 Ohm
Lead Channel Pacing Threshold Amplitude: 0.375 V
Lead Channel Pacing Threshold Amplitude: 0.875 V
Lead Channel Pacing Threshold Pulse Width: 0.4 ms
Lead Channel Pacing Threshold Pulse Width: 0.4 ms
Lead Channel Sensing Intrinsic Amplitude: 11 mV
Lead Channel Sensing Intrinsic Amplitude: 2.25 mV
Lead Channel Setting Pacing Amplitude: 2 V
Lead Channel Setting Pacing Amplitude: 2.5 V
Lead Channel Setting Pacing Pulse Width: 0.4 ms
Lead Channel Setting Pacing Pulse Width: 0.4 ms
Lead Channel Setting Sensing Sensitivity: 0.3 mV
MDC IDC MSMT BATTERY VOLTAGE: 2.63 V
MDC IDC MSMT LEADCHNL RA SENSING INTR AMPL: 2.25 mV
MDC IDC MSMT LEADCHNL RV PACING THRESHOLD AMPLITUDE: 0.75 V
MDC IDC MSMT LEADCHNL RV PACING THRESHOLD PULSEWIDTH: 0.4 ms
MDC IDC MSMT LEADCHNL RV SENSING INTR AMPL: 11 mV
MDC IDC SET LEADCHNL RA PACING AMPLITUDE: 1.5 V
MDC IDC SET ZONE DETECTION INTERVAL: 300 ms
MDC IDC SET ZONE DETECTION INTERVAL: 330 ms
MDC IDC STAT BRADY RV PERCENT PACED: 99.96 %
Zone Setting Detection Interval: 350 ms
Zone Setting Detection Interval: 370 ms

## 2014-06-27 NOTE — Progress Notes (Signed)
Remote ICD transmission.   

## 2014-07-13 ENCOUNTER — Other Ambulatory Visit: Payer: Self-pay | Admitting: Cardiovascular Disease

## 2014-07-18 ENCOUNTER — Encounter: Payer: Self-pay | Admitting: Cardiology

## 2014-07-20 ENCOUNTER — Encounter: Payer: Self-pay | Admitting: Cardiology

## 2014-07-27 ENCOUNTER — Encounter: Payer: Self-pay | Admitting: Internal Medicine

## 2014-08-22 ENCOUNTER — Ambulatory Visit (INDEPENDENT_AMBULATORY_CARE_PROVIDER_SITE_OTHER): Payer: Commercial Managed Care - HMO | Admitting: Internal Medicine

## 2014-08-22 ENCOUNTER — Encounter: Payer: Self-pay | Admitting: Internal Medicine

## 2014-08-22 ENCOUNTER — Encounter: Payer: Self-pay | Admitting: *Deleted

## 2014-08-22 VITALS — BP 118/78 | HR 92 | Ht 63.0 in | Wt 148.5 lb

## 2014-08-22 DIAGNOSIS — I5032 Chronic diastolic (congestive) heart failure: Secondary | ICD-10-CM

## 2014-08-22 DIAGNOSIS — R0602 Shortness of breath: Secondary | ICD-10-CM

## 2014-08-22 DIAGNOSIS — I429 Cardiomyopathy, unspecified: Secondary | ICD-10-CM

## 2014-08-22 DIAGNOSIS — IMO0001 Reserved for inherently not codable concepts without codable children: Secondary | ICD-10-CM

## 2014-08-22 DIAGNOSIS — I428 Other cardiomyopathies: Secondary | ICD-10-CM

## 2014-08-22 DIAGNOSIS — T82198A Other mechanical complication of other cardiac electronic device, initial encounter: Secondary | ICD-10-CM

## 2014-08-22 DIAGNOSIS — I951 Orthostatic hypotension: Secondary | ICD-10-CM

## 2014-08-22 LAB — MDC_IDC_ENUM_SESS_TYPE_INCLINIC
Brady Statistic AP VP Percent: 0.02 %
Brady Statistic AS VP Percent: 99.94 %
Brady Statistic AS VS Percent: 0.03 %
Brady Statistic RA Percent Paced: 0.03 %
Brady Statistic RV Percent Paced: 99.96 %
HighPow Impedance: 44 Ohm
HighPow Impedance: 54 Ohm
Lead Channel Impedance Value: 418 Ohm
Lead Channel Impedance Value: 589 Ohm
Lead Channel Impedance Value: 741 Ohm
Lead Channel Pacing Threshold Amplitude: 0.5 V
Lead Channel Pacing Threshold Pulse Width: 0.4 ms
Lead Channel Pacing Threshold Pulse Width: 0.4 ms
Lead Channel Sensing Intrinsic Amplitude: 11.75 mV
Lead Channel Sensing Intrinsic Amplitude: 12.5 mV
Lead Channel Setting Pacing Amplitude: 1.5 V
Lead Channel Setting Pacing Amplitude: 2.5 V
Lead Channel Setting Pacing Pulse Width: 0.4 ms
Lead Channel Setting Pacing Pulse Width: 0.4 ms
MDC IDC MSMT BATTERY VOLTAGE: 2.62 V
MDC IDC MSMT LEADCHNL LV IMPEDANCE VALUE: 532 Ohm
MDC IDC MSMT LEADCHNL LV IMPEDANCE VALUE: 893 Ohm
MDC IDC MSMT LEADCHNL LV PACING THRESHOLD AMPLITUDE: 1 V
MDC IDC MSMT LEADCHNL RA PACING THRESHOLD AMPLITUDE: 0.5 V
MDC IDC MSMT LEADCHNL RA PACING THRESHOLD PULSEWIDTH: 0.4 ms
MDC IDC MSMT LEADCHNL RA SENSING INTR AMPL: 1.5 mV
MDC IDC MSMT LEADCHNL RA SENSING INTR AMPL: 1.875 mV
MDC IDC SESS DTM: 20151006095307
MDC IDC SET LEADCHNL RV PACING AMPLITUDE: 2 V
MDC IDC SET LEADCHNL RV SENSING SENSITIVITY: 0.3 mV
MDC IDC SET ZONE DETECTION INTERVAL: 300 ms
MDC IDC SET ZONE DETECTION INTERVAL: 350 ms
MDC IDC SET ZONE DETECTION INTERVAL: 370 ms
MDC IDC STAT BRADY AP VS PERCENT: 0.01 %
Zone Setting Detection Interval: 330 ms

## 2014-08-22 NOTE — Patient Instructions (Addendum)
  Your physician has recommended that you have a generator change. Please see instruction sheet given. I will call with a date and time.    Please have your daughter contact me about date and time for procedures. [employer] Abdominal binder  Raise head of bed on 4" blocks  Isometric contraction of legs before standing

## 2014-08-22 NOTE — Progress Notes (Signed)
Patient Care Team: Debbora Dus, MD as PCP - General (Family Medicine)   HPI  Erica [name] is a 80 y.o. female seen in followup for her CRT-D that was implanted at [facility] for severe ischemic/nonischemic cardiomyopathy and class 2-3 congestive heart failure with left bundle branch block. The patient's and family are not sure that there was significant benefit associated with the implantation. She has had no known arrhythmias.  Echo 2011 showed mild LV dysfunction with an improvement EF-43%  Stress 2011 demonstrated "normal left ventricular function" EF measured at 79%.  She has had significant problems with orthostatic intolerance and we started her on vasoactive therpay without associated with any benefit.    She had continued with complaints of DOE with even modest exertion;  This is unassociated with chest pain   She underwent cath 6/14  >>normal LV FUNCTION without obstructive disease  Also complains of significant LH upon standing and wobbliness of gait--not with sitting     Past Medical History  Diagnosis Date  . Nonischemic cardiomyopathy     s/p Medtronic CRT-D, NYHA II-III at baseline  . Coronary artery disease     a. s/p prior stent-RCA b. borderline CAD on 04/2013 cath  . History of MI (myocardial infarction)   . Dyslipidemia   . Hypertension   . COPD (chronic obstructive pulmonary disease)   . Breast cancer   . Hypothyroidism   . Basal cell carcinoma 2011  . Respiratory infection   . Vitamin D deficiency     Past Surgical History  Procedure Laterality Date  . Cardiac defibrillator placement    . Insert / replace / remove pacemaker    . Mastectomy    . Abdominal hysterectomy    . Basal cell carcinoma excision      Monh's procedure  . Cardiac catheterization  2014    Current Outpatient Prescriptions  Medication Sig Dispense Refill  . albuterol (PROVENTIL HFA;VENTOLIN HFA) 108 (90 BASE) MCG/ACT inhaler Inhale 2 puffs into the lungs every 6 (six) hours as needed  for wheezing or shortness of breath.  1 Inhaler  0  . Ascorbic Acid (VITAMIN C) 500 MG tablet Take 500 mg by mouth as needed.       Marland Kitchen aspirin 81 MG EC tablet Take 81 mg by mouth daily.        Marland Kitchen CALCIUM-VITAMIN D-VITAMIN K PO Take by mouth. Takes 1 tablet daily.      . carvedilol (COREG) 6.25 MG tablet TAKE 1 TABLET (6.25 MG TOTAL) BY MOUTH 2 (TWO) TIMES DAILY.  90 tablet  3  . clopidogrel (PLAVIX) 75 MG tablet TAKE 1 TABLET (75 MG TOTAL) BY MOUTH DAILY.  90 tablet  3  . furosemide (LASIX) 40 MG tablet Take 1 tablet (40 mg total) by mouth daily as needed.  90 tablet  3  . levothyroxine (SYNTHROID, LEVOTHROID) 75 MCG tablet Take 75 mcg by mouth daily.      Marland Kitchen LORazepam (ATIVAN) 1 MG tablet Take 1 mg by mouth at bedtime as needed.        Marland Kitchen omeprazole (PRILOSEC) 20 MG capsule Take 20 mg by mouth as needed.      . vitamin B-12 (CYANOCOBALAMIN) 1000 MCG tablet Take 1,000 mcg by mouth daily.         No current facility-administered medications for this visit.    Allergies  Allergen Reactions  . Ace Inhibitors   . Amlodipine-Atorvastatin   . Ciprofloxacin   . Quinolones  Review of Systems negative except from HPI and PMH  Physical Exam BP 118/78  Pulse 92  Ht 5\' 3"  (1.6 m)  Wt 148 lb 8 oz (67.359 kg)  BMI 26.31 kg/m2 Well developed and well nourished in no acute distress HENT normal E scleral and icterus clear Neck Supple JVP flat; carotids brisk and full Clear to ausculation  Regular rate and rhythm,  Positive S4 early systolic murmur Soft with active bowel sounds No clubbing cyanosis none Edema Alert and oriented, grossly normal motor and sensory function Skin Warm and Dry  ECG demonstrates P. Synchronous pacing  Assessment and  Plan  NICM-resolved  CAD  HFPEF  ORTHOSTATIC HYPOTENSION  Hypertension  ICD-CRT-Medtronic   Her device has reached ERI  We have reviewed the benefits and risks of generator replacement.  These include but are not limited to lead fracture  and infection.  The patient understands, agrees and is willing to proceed.    We will anticpate discussion as to replacement w Defib or pacer  Dyspnea may be related to HFPEF or possibly chronotropic incompetence as she says at the gym her HR in the 80s  We discussed behavioural maneuvers for orhtostatic intolerance including isometric contraction, raising head of bed and the possible subsequent use of meds, prob mestinon given significant supine hypertension  We will ask her to record HR at gym to assess for chronotropic incompentence

## 2014-08-23 ENCOUNTER — Telehealth: Payer: Self-pay

## 2014-08-23 DIAGNOSIS — Z01812 Encounter for preprocedural laboratory examination: Secondary | ICD-10-CM

## 2014-08-23 NOTE — Telephone Encounter (Signed)
Pt daughter called about setting up a date for her to get battery change out.

## 2014-08-23 NOTE — Telephone Encounter (Signed)
No answer no vm

## 2014-08-24 ENCOUNTER — Telehealth: Payer: Self-pay

## 2014-08-24 NOTE — Telephone Encounter (Signed)
Pt has a question regarding what the abdominal binder is for, she has already purchased one. Please call and advise.

## 2014-08-24 NOTE — Telephone Encounter (Signed)
LVM 08/24/14

## 2014-08-25 NOTE — Telephone Encounter (Signed)
Discussed binder with patients daughter  She verbalized understanding

## 2014-09-01 NOTE — Telephone Encounter (Signed)
Informed patient and daughter that generator change will be 10/13/14 Patient to arrive at 0530 am  Patients daughter verbalized understanding  Pre procedure labs scheduled

## 2014-10-02 ENCOUNTER — Encounter (HOSPITAL_COMMUNITY): Payer: Self-pay | Admitting: Pharmacy Technician

## 2014-10-09 ENCOUNTER — Other Ambulatory Visit (INDEPENDENT_AMBULATORY_CARE_PROVIDER_SITE_OTHER): Payer: Commercial Managed Care - HMO

## 2014-10-09 DIAGNOSIS — Z01812 Encounter for preprocedural laboratory examination: Secondary | ICD-10-CM

## 2014-10-10 ENCOUNTER — Telehealth: Payer: Self-pay

## 2014-10-10 LAB — PROTIME-INR
INR: 1.1 (ref 0.8–1.2)
Prothrombin Time: 11.2 s (ref 9.1–12.0)

## 2014-10-10 NOTE — Telephone Encounter (Signed)
Reviewed procedure instructions, date and time with patient and daughter  They verbalized understanding

## 2014-10-10 NOTE — Telephone Encounter (Signed)
Pt daughter called, is unsure of where to go on Friday for pt procedure. Also has some questions regarding the procedure. Please call.

## 2014-10-11 ENCOUNTER — Telehealth: Payer: Self-pay | Admitting: *Deleted

## 2014-10-11 NOTE — Telephone Encounter (Signed)
Late Entry from 10/10/14: Spoke with patient's dtr --  Stanton Kidney, RN at 10/10/2014 5:58 PM     Status: Signed       Expand All Collapse All   Advised mom's procedure cancelled secondary to device not at [facility] yet. Asked to have mom send transmission tonight/tomorrow and we would call with further instructions once reviewed transmission. She is agreeable to plan.

## 2014-10-11 NOTE — Telephone Encounter (Signed)
Left message on dtr's personal cell phone advising battery check on 12/28.

## 2014-10-13 ENCOUNTER — Ambulatory Visit (HOSPITAL_COMMUNITY): Admission: RE | Admit: 2014-10-13 | Payer: Medicare PPO | Source: Ambulatory Visit | Admitting: Internal Medicine

## 2014-10-13 ENCOUNTER — Encounter (HOSPITAL_COMMUNITY): Admission: RE | Payer: Self-pay | Source: Ambulatory Visit

## 2014-10-13 SURGERY — PACEMAKER GENERATOR CHANGE
Anesthesia: LOCAL

## 2014-11-03 ENCOUNTER — Ambulatory Visit (INDEPENDENT_AMBULATORY_CARE_PROVIDER_SITE_OTHER): Payer: Commercial Managed Care - HMO | Admitting: *Deleted

## 2014-11-03 ENCOUNTER — Ambulatory Visit (INDEPENDENT_AMBULATORY_CARE_PROVIDER_SITE_OTHER): Payer: Commercial Managed Care - HMO | Admitting: Cardiovascular Disease

## 2014-11-03 ENCOUNTER — Encounter: Payer: Self-pay | Admitting: Cardiovascular Disease

## 2014-11-03 VITALS — BP 140/82 | HR 87 | Ht 62.0 in | Wt 148.5 lb

## 2014-11-03 DIAGNOSIS — I951 Orthostatic hypotension: Secondary | ICD-10-CM

## 2014-11-03 DIAGNOSIS — R0602 Shortness of breath: Secondary | ICD-10-CM

## 2014-11-03 DIAGNOSIS — I429 Cardiomyopathy, unspecified: Secondary | ICD-10-CM

## 2014-11-03 DIAGNOSIS — E782 Mixed hyperlipidemia: Secondary | ICD-10-CM

## 2014-11-03 DIAGNOSIS — I10 Essential (primary) hypertension: Secondary | ICD-10-CM

## 2014-11-03 DIAGNOSIS — I5032 Chronic diastolic (congestive) heart failure: Secondary | ICD-10-CM

## 2014-11-03 DIAGNOSIS — I2510 Atherosclerotic heart disease of native coronary artery without angina pectoris: Secondary | ICD-10-CM

## 2014-11-03 LAB — MDC_IDC_ENUM_SESS_TYPE_INCLINIC
Battery Voltage: 2.6 V
Brady Statistic RA Percent Paced: 0.03 %
Brady Statistic RV Percent Paced: 99.96 %
Date Time Interrogation Session: 20151218152206
HIGH POWER IMPEDANCE MEASURED VALUE: 38 Ohm
HIGH POWER IMPEDANCE MEASURED VALUE: 50 Ohm
Lead Channel Impedance Value: 684 Ohm
Lead Channel Pacing Threshold Amplitude: 0.625 V
Lead Channel Pacing Threshold Amplitude: 0.875 V
Lead Channel Pacing Threshold Pulse Width: 0.4 ms
Lead Channel Pacing Threshold Pulse Width: 0.4 ms
Lead Channel Sensing Intrinsic Amplitude: 1.875 mV
Lead Channel Sensing Intrinsic Amplitude: 10.125 mV
Lead Channel Sensing Intrinsic Amplitude: 10.125 mV
Lead Channel Setting Pacing Amplitude: 1.5 V
Lead Channel Setting Pacing Amplitude: 2 V
Lead Channel Setting Pacing Amplitude: 2.5 V
Lead Channel Setting Pacing Pulse Width: 0.4 ms
MDC IDC MSMT LEADCHNL LV IMPEDANCE VALUE: 532 Ohm
MDC IDC MSMT LEADCHNL LV IMPEDANCE VALUE: 627 Ohm
MDC IDC MSMT LEADCHNL LV IMPEDANCE VALUE: 912 Ohm
MDC IDC MSMT LEADCHNL RA IMPEDANCE VALUE: 380 Ohm
MDC IDC MSMT LEADCHNL RA PACING THRESHOLD AMPLITUDE: 0.375 V
MDC IDC MSMT LEADCHNL RA PACING THRESHOLD PULSEWIDTH: 0.4 ms
MDC IDC MSMT LEADCHNL RA SENSING INTR AMPL: 1.875 mV
MDC IDC SET LEADCHNL RV PACING PULSEWIDTH: 0.4 ms
MDC IDC SET LEADCHNL RV SENSING SENSITIVITY: 0.3 mV
MDC IDC SET ZONE DETECTION INTERVAL: 350 ms
MDC IDC STAT BRADY AP VP PERCENT: 0.02 %
MDC IDC STAT BRADY AP VS PERCENT: 0.01 %
MDC IDC STAT BRADY AS VP PERCENT: 99.94 %
MDC IDC STAT BRADY AS VS PERCENT: 0.03 %
Zone Setting Detection Interval: 300 ms
Zone Setting Detection Interval: 330 ms
Zone Setting Detection Interval: 370 ms

## 2014-11-03 NOTE — Assessment & Plan Note (Signed)
She appears relatively euvolemic on today's visit. No changes to her medications. She will continue to take Lasix when necessary for ankle edema

## 2014-11-03 NOTE — Progress Notes (Signed)
Patient ID: Erica [name], female    DOB: 03-27-1931, 80 y.o.   MRN: [id number]  HPI Comments: Erica [name] is a 80 year old woman with nonischemic cardiomyopathy CRT-D that was implanted at [facility]  with class 2-3 congestive heart failure with left bundle branch block, coronary artery disease with stenting to her RCA, hyperlipidemia, hypertension  Previous history of bronchitis. her husband passed away earlier in 02/27/2012. She presents for routine followup  Of her coronary artery disease   In follow-up, she reports that she feels well. Blood pressure is relatively stable. She was scheduled for device change out but this was delayed possibly secondary to insurance issues.  Prior EP note details she has reached ERI  She does report some cramping in her thighs bilaterally. Etiology unclear. She reports that she takes an over-the-counter pill and symptoms seem to resolve  No significant chest pain. She takes Lasix several times per month. Relatively active, exercises.   EKG shows paced rhythm , rate 87 bpm   other past medical history  Cardiac catheterization in late 02-26-13 showed moderate proximal and distal RCA disease estimated at 60%. Also mild to moderate proximal to mid LAD disease. Ejection fraction estimated at greater than 55%. There is no significant change in her RCA lesions compared to 02-27-08.  distal RCA lesion with moderate stenosis. The lesions were discussed with interventional cardiology and medical management was recommended .  Previous  Echo April 2011 showing mild LV systolic dysfunction, ejection fraction 43%, mildly dilated left atrium, mild pulmonary hypertension with right ventricular systolic pressure of 33  Stress test April 2011 showing normal study with normal ejection fraction. Ejection fraction estimated at 79%. This was a lexiscan.  cardiac catheterization in January 2008 showing 40% restenosis of mid RCA stent and 50% mid LAD disease. Notes indicate 2 stents in her RCA.  EKG  shows paced rhythm with rate 77 beats per minute Labs from 12/28/2013 TSH 4.47, BNP 1200 Chest x-ray with bilateral chronic interstitial disease     Outpatient Encounter Prescriptions as of 11/03/2014  Medication Sig  . albuterol (PROVENTIL HFA;VENTOLIN HFA) 108 (90 BASE) MCG/ACT inhaler Inhale 2 puffs into the lungs every 6 (six) hours as needed for wheezing or shortness of breath.  . Ascorbic Acid (VITAMIN C) 500 MG tablet Take 500 mg by mouth as needed.   Marland Kitchen aspirin 81 MG EC tablet Take 81 mg by mouth daily.    Marland Kitchen CALCIUM-VITAMIN D-VITAMIN K PO Take by mouth. Takes 1 tablet daily.  . carvedilol (COREG) 6.25 MG tablet TAKE 1 TABLET (6.25 MG TOTAL) BY MOUTH 2 (TWO) TIMES DAILY.  Marland Kitchen clopidogrel (PLAVIX) 75 MG tablet TAKE 1 TABLET (75 MG TOTAL) BY MOUTH DAILY.  . furosemide (LASIX) 40 MG tablet Take 1 tablet (40 mg total) by mouth daily as needed. (Patient taking differently: Take 40 mg by mouth daily as needed for fluid. )  . levothyroxine (SYNTHROID, LEVOTHROID) 75 MCG tablet Take 75 mcg by mouth daily.  Marland Kitchen LORazepam (ATIVAN) 1 MG tablet Take 1 mg by mouth at bedtime as needed for sleep.   Marland Kitchen omeprazole (PRILOSEC) 20 MG capsule Take 20 mg by mouth as needed.  . pravastatin (PRAVACHOL) 10 MG tablet Take 10 mg by mouth daily.  . vitamin B-12 (CYANOCOBALAMIN) 1000 MCG tablet Take 1,000 mcg by mouth daily.      social history  reports that she has never smoked. She has never used smokeless tobacco. She reports that she does not drink alcohol or use illicit  drugs.  Review of Systems  Constitutional: Negative.   Respiratory: Negative.   Cardiovascular: Negative.   Gastrointestinal: Negative.   Musculoskeletal: Positive for gait problem.  Neurological: Negative.   Hematological: Negative.   Psychiatric/Behavioral: Negative.   All other systems reviewed and are negative.   BP 140/82 mmHg  Pulse 87  Ht 5\' 2"  (1.575 m)  Wt 148 lb 8 oz (67.359 kg)  BMI 27.15 kg/m2  Physical Exam   Constitutional: She is oriented to person, place, and time. She appears well-developed and well-nourished.  HENT:  Head: Normocephalic.  Nose: Nose normal.  Mouth/Throat: Oropharynx is clear and moist.  Eyes: Conjunctivae are normal. Pupils are equal, round, and reactive to light.  Neck: Normal range of motion. Neck supple. No JVD present.  Cardiovascular: Normal rate, regular rhythm, S1 normal, S2 normal, normal heart sounds and intact distal pulses.  Exam reveals no gallop and no friction rub.   No murmur heard. Pulmonary/Chest: Effort normal and breath sounds normal. No respiratory distress. She has no wheezes. She has no rales. She exhibits no tenderness.  Abdominal: Soft. Bowel sounds are normal. She exhibits no distension. There is no tenderness.  Musculoskeletal: Normal range of motion. She exhibits no edema or tenderness.  Lymphadenopathy:    She has no cervical adenopathy.  Neurological: She is [name] and oriented to person, place, and time. Coordination normal.  Skin: Skin is warm and dry. No rash noted. No erythema.  Psychiatric: She has a normal mood and affect. Her behavior is normal. Judgment and thought content normal.    Assessment and Plan  Nursing note and vitals reviewed.

## 2014-11-03 NOTE — Assessment & Plan Note (Signed)
Currently with no symptoms of angina. No further workup at this time. Continue current medication regimen. 

## 2014-11-03 NOTE — Assessment & Plan Note (Signed)
She has leg cramping, possibly from the pravastatin. She will try various over-the-counter remedies for her cramps. Suggested she stay on pravastatin if tolerated

## 2014-11-03 NOTE — Assessment & Plan Note (Signed)
She reports symptoms have significantly improved.  She is not wearing abdominal binder. She goes slowly when standing, overall feels well. Some gait instability

## 2014-11-03 NOTE — Addendum Note (Signed)
Addended by: Minna Merritts on: 11/03/2014 02:46 PM   Modules accepted: Level of Service

## 2014-11-03 NOTE — Patient Instructions (Signed)
You are doing well. No medication changes were made.  Please call us if you have new issues that need to be addressed before your next appt.  Your physician wants you to follow-up in: 6 months.  You will receive a reminder letter in the mail two months in advance. If you don't receive a letter, please call our office to schedule the follow-up appointment.   

## 2014-11-03 NOTE — Progress Notes (Signed)
Interrogation only for battery data.

## 2014-11-03 NOTE — Assessment & Plan Note (Signed)
Blood pressure is well controlled on today's visit. No changes made to the medications. 

## 2014-11-24 ENCOUNTER — Encounter: Payer: Self-pay | Admitting: Internal Medicine

## 2014-12-04 ENCOUNTER — Encounter: Payer: Self-pay | Admitting: Internal Medicine

## 2014-12-04 ENCOUNTER — Ambulatory Visit (INDEPENDENT_AMBULATORY_CARE_PROVIDER_SITE_OTHER): Payer: Commercial Managed Care - HMO | Admitting: *Deleted

## 2014-12-04 DIAGNOSIS — I5032 Chronic diastolic (congestive) heart failure: Secondary | ICD-10-CM

## 2014-12-04 DIAGNOSIS — I428 Other cardiomyopathies: Secondary | ICD-10-CM

## 2014-12-04 DIAGNOSIS — I429 Cardiomyopathy, unspecified: Secondary | ICD-10-CM

## 2014-12-04 NOTE — Progress Notes (Signed)
Remote ICD transmission.   

## 2014-12-05 LAB — MDC_IDC_ENUM_SESS_TYPE_REMOTE
Brady Statistic AP VP Percent: 0.02 %
Brady Statistic AP VS Percent: 0.01 %
Brady Statistic AS VP Percent: 99.94 %
Brady Statistic AS VS Percent: 0.03 %
Brady Statistic RV Percent Paced: 99.96 %
HighPow Impedance: 38 Ohm
HighPow Impedance: 44 Ohm
Lead Channel Impedance Value: 380 Ohm
Lead Channel Impedance Value: 437 Ohm
Lead Channel Impedance Value: 532 Ohm
Lead Channel Impedance Value: 779 Ohm
Lead Channel Pacing Threshold Pulse Width: 0.4 ms
Lead Channel Pacing Threshold Pulse Width: 0.4 ms
Lead Channel Sensing Intrinsic Amplitude: 11.125 mV
Lead Channel Sensing Intrinsic Amplitude: 11.125 mV
Lead Channel Sensing Intrinsic Amplitude: 2.375 mV
Lead Channel Setting Pacing Amplitude: 2 V
Lead Channel Setting Pacing Pulse Width: 0.4 ms
Lead Channel Setting Pacing Pulse Width: 0.4 ms
MDC IDC MSMT BATTERY VOLTAGE: 2.62 V
MDC IDC MSMT LEADCHNL LV PACING THRESHOLD AMPLITUDE: 0.875 V
MDC IDC MSMT LEADCHNL RA PACING THRESHOLD AMPLITUDE: 0.375 V
MDC IDC MSMT LEADCHNL RA PACING THRESHOLD PULSEWIDTH: 0.4 ms
MDC IDC MSMT LEADCHNL RA SENSING INTR AMPL: 2.375 mV
MDC IDC MSMT LEADCHNL RV IMPEDANCE VALUE: 627 Ohm
MDC IDC MSMT LEADCHNL RV PACING THRESHOLD AMPLITUDE: 0.625 V
MDC IDC SESS DTM: 20160118170808
MDC IDC SET LEADCHNL LV PACING AMPLITUDE: 2.5 V
MDC IDC SET LEADCHNL RA PACING AMPLITUDE: 1.5 V
MDC IDC SET LEADCHNL RV SENSING SENSITIVITY: 0.3 mV
MDC IDC SET ZONE DETECTION INTERVAL: 300 ms
MDC IDC SET ZONE DETECTION INTERVAL: 330 ms
MDC IDC STAT BRADY RA PERCENT PACED: 0.03 %
Zone Setting Detection Interval: 350 ms
Zone Setting Detection Interval: 370 ms

## 2014-12-14 ENCOUNTER — Encounter: Payer: Self-pay | Admitting: *Deleted

## 2014-12-14 ENCOUNTER — Telehealth: Payer: Self-pay | Admitting: Internal Medicine

## 2014-12-14 NOTE — Telephone Encounter (Signed)
Patient called to check status of pacemaker procedure.  Patient would like to know if she is still needing to have her pacer changed (? Battery) or if the plan is not to do it at all now.  Please call patient with the plan.

## 2014-12-14 NOTE — Telephone Encounter (Signed)
Patient when like to know when she should have her generator changed? She was told in October she needed a generator change  She scheduled gen change for 10/14/15 She stated "they called and cancelled procedure because of insurance problems" She has not heard from Dr. Caryl Comes  She wants to know when to have this done?

## 2014-12-14 NOTE — Telephone Encounter (Signed)
LVM to inform patient that I discussed her questions with [place] stated she will discuss with EP and let me know  But patient is not at [facility] and this is why insurance did not approve Gen change  Patient does not need to be concerned about battery being dead

## 2014-12-15 NOTE — Telephone Encounter (Signed)
Called patient and informed her she wasn't ERI yet and explained what this meant. Patient is aware another remote transmission is scheduled for 2/18.

## 2014-12-21 ENCOUNTER — Telehealth: Payer: Self-pay

## 2014-12-21 NOTE — Telephone Encounter (Signed)
Patients daughter wanted to confirm that her mothers gen change was cancelled  Reviewed note below  Patients daughter verbalized understanding       Stanton Kidney, RN at 12/15/2014 6:58 PM             Called patient and informed her she wasn't ERI yet and explained what this meant. Patient is aware another remote transmission is scheduled for 2/18.

## 2014-12-21 NOTE — Telephone Encounter (Signed)
Pt daughter called, states that she would like to talk with Elmyra Ricks regarding pt pacemaker. Please call.

## 2014-12-22 ENCOUNTER — Telehealth: Payer: Self-pay | Admitting: Cardiology

## 2014-12-22 NOTE — Telephone Encounter (Signed)
Called and informed pt that she has reached ERI and that someone will be contacting her to schedule an appt to see MD. Pt verbalized understanding.

## 2014-12-25 ENCOUNTER — Encounter: Payer: Self-pay | Admitting: Cardiology

## 2014-12-27 ENCOUNTER — Telehealth: Payer: Self-pay | Admitting: *Deleted

## 2014-12-27 NOTE — Telephone Encounter (Signed)
Pt daughter has questions on upcoming procedure 1. Someone called pt stating that it was approved to to this and it was time for her to change batteries.   She just wants to know some more details on this. Please advise.

## 2015-01-04 ENCOUNTER — Ambulatory Visit (INDEPENDENT_AMBULATORY_CARE_PROVIDER_SITE_OTHER): Payer: Commercial Managed Care - HMO | Admitting: *Deleted

## 2015-01-04 DIAGNOSIS — Z95810 Presence of automatic (implantable) cardiac defibrillator: Secondary | ICD-10-CM

## 2015-01-04 NOTE — Progress Notes (Signed)
Remote ICD transmission.   

## 2015-01-04 NOTE — Telephone Encounter (Signed)
Spoke with patient's dtr., confirmed upcoming appt on 3/1 in [place] office. She will ask any questions she has at that appt.

## 2015-01-05 LAB — MDC_IDC_ENUM_SESS_TYPE_REMOTE
Brady Statistic AP VS Percent: 0.1 %
Brady Statistic AS VP Percent: 99.9 %
Brady Statistic AS VS Percent: 0.1 %
HIGH POWER IMPEDANCE MEASURED VALUE: 36 Ohm
Lead Channel Impedance Value: 380 Ohm
Lead Channel Impedance Value: 589 Ohm
Lead Channel Pacing Threshold Amplitude: 0.375 V
Lead Channel Pacing Threshold Amplitude: 0.875 V
Lead Channel Pacing Threshold Pulse Width: 0.4 ms
Lead Channel Pacing Threshold Pulse Width: 0.4 ms
Lead Channel Sensing Intrinsic Amplitude: 10.3 mV
Lead Channel Sensing Intrinsic Amplitude: 2.3 mV
Lead Channel Setting Pacing Amplitude: 1.5 V
Lead Channel Setting Pacing Amplitude: 2 V
Lead Channel Setting Pacing Pulse Width: 0.4 ms
Lead Channel Setting Pacing Pulse Width: 0.4 ms
Lead Channel Setting Sensing Sensitivity: 0.3 mV
MDC IDC MSMT BATTERY VOLTAGE: 2.61 V
MDC IDC MSMT LEADCHNL LV IMPEDANCE VALUE: 855 Ohm
MDC IDC SET LEADCHNL LV PACING AMPLITUDE: 2.5 V
MDC IDC SET ZONE DETECTION INTERVAL: 300 ms
MDC IDC STAT BRADY AP VP PERCENT: 0.1 %
Zone Setting Detection Interval: 330 ms
Zone Setting Detection Interval: 350 ms
Zone Setting Detection Interval: 370 ms

## 2015-01-10 ENCOUNTER — Other Ambulatory Visit: Payer: Self-pay | Admitting: Cardiovascular Disease

## 2015-01-16 ENCOUNTER — Ambulatory Visit (INDEPENDENT_AMBULATORY_CARE_PROVIDER_SITE_OTHER): Payer: Commercial Managed Care - HMO | Admitting: Internal Medicine

## 2015-01-16 ENCOUNTER — Encounter: Payer: Self-pay | Admitting: Internal Medicine

## 2015-01-16 VITALS — BP 132/83 | HR 82 | Ht 63.0 in | Wt 147.0 lb

## 2015-01-16 DIAGNOSIS — I428 Other cardiomyopathies: Secondary | ICD-10-CM

## 2015-01-16 DIAGNOSIS — I10 Essential (primary) hypertension: Secondary | ICD-10-CM

## 2015-01-16 DIAGNOSIS — R0602 Shortness of breath: Secondary | ICD-10-CM

## 2015-01-16 DIAGNOSIS — I429 Cardiomyopathy, unspecified: Secondary | ICD-10-CM

## 2015-01-16 NOTE — Progress Notes (Signed)
Patient Care Team: Debbora Dus, MD as PCP - General (Family Medicine)   HPI  Erica [name] is a 81 y.o. female seen in followup for her CRT-D that was implanted at [facility] for severe ischemic/nonischemic cardiomyopathy and class 2-3 congestive heart failure with left bundle branch block. The patient's and family are not sure that there was significant benefit associated with the implantation. She has had no known arrhythmias.  Echo 2011 showed mild LV dysfunction with an improvement EF-43%  Stress 2011 demonstrated "normal left ventricular function" EF measured at 79%.  She has had significant problems with orthostatic intolerance and we started her on vasoactive therapy without associated with any benefit.  At the last visit we discussed the use of an abdominal binder.  She continueswith complaints of DOE with even modest exertion;  This is unassociated with chest pain   She underwent cath 6/14  >>normal LV FUNCTION without obstructive disease     Past Medical History  Diagnosis Date  . Nonischemic cardiomyopathy     s/p Medtronic CRT-D, NYHA II-III at baseline  . Coronary artery disease     a. s/p prior stent-RCA b. borderline CAD on 04/2013 cath  . History of MI (myocardial infarction)   . Dyslipidemia   . Hypertension   . COPD (chronic obstructive pulmonary disease)   . Breast cancer   . Hypothyroidism   . Basal cell carcinoma 2011  . Respiratory infection   . Vitamin D deficiency     Past Surgical History  Procedure Laterality Date  . Cardiac defibrillator placement    . Insert / replace / remove pacemaker    . Mastectomy    . Abdominal hysterectomy    . Basal cell carcinoma excision      Monh's procedure  . Cardiac catheterization  2014    Current Outpatient Prescriptions  Medication Sig Dispense Refill  . albuterol (PROVENTIL HFA;VENTOLIN HFA) 108 (90 BASE) MCG/ACT inhaler Inhale 2 puffs into the lungs every 6 (six) hours as needed for wheezing or shortness of  breath. 1 Inhaler 0  . Ascorbic Acid (VITAMIN C) 500 MG tablet Take 500 mg by mouth as needed.     Marland Kitchen aspirin 81 MG EC tablet Take 81 mg by mouth daily.      Marland Kitchen CALCIUM-VITAMIN D-VITAMIN K PO Take by mouth. Takes 1 tablet daily.    . carvedilol (COREG) 6.25 MG tablet TAKE 1 TABLET (6.25 MG TOTAL) BY MOUTH 2 (TWO) TIMES DAILY. 90 tablet 3  . clopidogrel (PLAVIX) 75 MG tablet TAKE 1 TABLET (75 MG TOTAL) BY MOUTH DAILY. 90 tablet 3  . furosemide (LASIX) 40 MG tablet Take 1 tablet (40 mg total) by mouth daily as needed. (Patient taking differently: Take 40 mg by mouth daily as needed for fluid. ) 90 tablet 3  . levothyroxine (SYNTHROID, LEVOTHROID) 75 MCG tablet Take 75 mcg by mouth daily.    Marland Kitchen LORazepam (ATIVAN) 1 MG tablet Take 1 mg by mouth at bedtime as needed for sleep.     Marland Kitchen omeprazole (PRILOSEC) 20 MG capsule Take 20 mg by mouth as needed.    . pravastatin (PRAVACHOL) 10 MG tablet Take 10 mg by mouth daily.  0  . vitamin B-12 (CYANOCOBALAMIN) 1000 MCG tablet Take 1,000 mcg by mouth daily.       No current facility-administered medications for this visit.    Allergies  Allergen Reactions  . Ace Inhibitors Other (See Comments)    Reaction unknown  . Amlodipine-Atorvastatin Other (See  Comments)    Reaction unknown  . Ciprofloxacin Other (See Comments)    Reaction unknown  . Quinolones Other (See Comments)    Reaction unknown    Review of Systems negative except from HPI and PMH  Physical Exam BP 132/83 mmHg  Pulse 82  Ht 5\' 3"  (1.6 m)  Wt 147 lb (66.679 kg)  BMI 26.05 kg/m2 Well developed and well nourished in no acute distress HENT normal E scleral and icterus clear Neck Supple JVP flat; carotids brisk and full Clear to ausculation  Regular rate and rhythm,  Positive S4 early systolic murmur Soft with active bowel sounds No clubbing cyanosis none Edema Alert and oriented, grossly normal motor and sensory function Skin Warm and Dry  ECG demonstrates P. Synchronous  pacing  Assessment and  Plan  NICM-resolved  CAD  HFPEF  ORTHOSTATIC HYPOTENSION  Hypertension  ICD-CRT-Medtronic   Her device has reached ERI  We have reviewed the benefits and risks of generator replacement.  These include but are not limited to lead fracture and infection.  The patient understands, agrees and is willing to proceed.    We   discussed   replacement w Defib or pacer as catheterization 6/14 had demonstrated interval normalization of LV systolic function.  In this regard the issues of how is the CRT responisble for the normalization of LV function is never clear, so I think CRT reimplantation is preferred to no device-- the issue of ICD is related to LV function and AUC label it has M- so will defer to pt and family preference once we have the above data    With her recent change in symptoms, we will undertake Myoview scanning both for assessment of LV function as well as the known partially obstructive coronary disease identified a catheterization.  She continues to struggle with orthostatic intolerance.  Systolic blood pressure is better

## 2015-01-16 NOTE — Patient Instructions (Signed)
[place]  Your caregiver has ordered a Stress Test with nuclear imaging. The purpose of this test is to evaluate the blood supply to your heart muscle. This procedure is referred to as a "Non-Invasive Stress Test." This is because other than having an IV started in your vein, nothing is inserted or "invades" your body. Cardiac stress tests are done to find areas of poor blood flow to the heart by determining the extent of coronary artery disease (CAD). Some patients exercise on a treadmill, which naturally increases the blood flow to your heart, while others who are  unable to walk on a treadmill due to physical limitations have a pharmacologic/chemical stress agent called Lexiscan . This medicine will mimic walking on a treadmill by temporarily increasing your coronary blood flow.   Please note: these test may take anywhere between 2-4 hours to complete  PLEASE REPORT TO [facility] AT THE FIRST DESK WILL DIRECT YOU WHERE TO GO  Date of Procedure:__________3/11/16___________________________  Arrival Time for Procedure:_________0745 am_____________________   PLEASE NOTIFY THE OFFICE AT LEAST 24 HOURS IN ADVANCE IF YOU ARE UNABLE TO KEEP YOUR APPOINTMENT.  [phone] AND  PLEASE NOTIFY NUCLEAR MEDICINE AT [facility] AT LEAST 24 HOURS IN ADVANCE IF YOU ARE UNABLE TO KEEP YOUR APPOINTMENT. [phone]  How to prepare for your Myoview test:  1. Do not eat or drink after midnight 2. No caffeine for 24 hours prior to test 3. No smoking 24 hours prior to test. 4. Your medication may be taken with water.  If your doctor stopped a medication because of this test, do not take that medication. 5. Ladies, please do not wear dresses.  Skirts or pants are appropriate. Please wear a short sleeve shirt. 6. No perfume, cologne or lotion. 7. Wear comfortable walking shoes. No heels!

## 2015-01-26 ENCOUNTER — Ambulatory Visit: Payer: Self-pay | Admitting: Internal Medicine

## 2015-01-26 ENCOUNTER — Other Ambulatory Visit: Payer: Self-pay

## 2015-01-26 DIAGNOSIS — R0602 Shortness of breath: Secondary | ICD-10-CM | POA: Diagnosis not present

## 2015-01-29 ENCOUNTER — Telehealth: Payer: Self-pay

## 2015-01-29 DIAGNOSIS — R0602 Shortness of breath: Secondary | ICD-10-CM

## 2015-01-29 NOTE — Telephone Encounter (Signed)
Spoke w/ pt's daughter.  She reports that pt was SOB in the office during pacer check and Dr. Caryl Comes ordered a stress test. She is calling to see about the results of this, as pt is still c/o SOB. Reports BP this am of 138/68, P 84. She will try to obtain more BP readings to give when we call w/ results.

## 2015-01-29 NOTE — Telephone Encounter (Signed)
Pt daughter calling requesting stress test results. Also, states pt is very SOB Pt c/o Shortness Of Breath: STAT if SOB developed within the last 24 hours or pt is noticeably SOB on the phone  1. Are you currently SOB (can you hear that pt is SOB on the phone)? Daughter is calling  2. How long have you been experiencing SOB? For a while  3. Are you SOB when sitting or when up moving around? All the time  4. Are you currently experiencing any other symptoms? Low BP

## 2015-01-30 NOTE — Telephone Encounter (Signed)
Would please set up for echocardiogram to examine ejection fraction Stress test suggesting EF of 15% Also with symptoms of shortness of breath Then needs follow-up with me

## 2015-01-30 NOTE — Telephone Encounter (Signed)
  Result got lost, and didn't find in my in basket  In any case shows sigificiant interval worsening of LV function from 2014 EF 70>>15%  Please arrange appt with TG to explore potential causes

## 2015-01-31 ENCOUNTER — Telehealth: Payer: Self-pay

## 2015-01-31 NOTE — Telephone Encounter (Signed)
Pt would like stress test results. Also, pt is confused about why she is having echo and f/u with Dr. Darnell Level.

## 2015-01-31 NOTE — Addendum Note (Signed)
Addended by: Dede Query R on: 01/31/2015 08:50 AM   Modules accepted: Orders

## 2015-01-31 NOTE — Telephone Encounter (Signed)
Informed patient that Dr. Caryl Comes ordered the echo and follow up based on her stress test results  Patient verbalized understanding and agreed to come to her scheduled appts

## 2015-02-01 ENCOUNTER — Telehealth: Payer: Self-pay | Admitting: *Deleted

## 2015-02-01 ENCOUNTER — Encounter: Payer: Self-pay | Admitting: Internal Medicine

## 2015-02-01 NOTE — Telephone Encounter (Signed)
Pt daughter calling upset and is upset because the appointment that we made her are too far out, according to her.  She states that the nurse told patient yesterday that her "heart is getting weak" and from that why is her appointment out this far.  I stated to her that was our soonest. I have added them to the wait list and as soon as someone falls off we will try and contact them.   Read the notes and stated to her that both doctors would like her to do the tests then do a follow up But she was upset and would like a nurse to call her.

## 2015-02-01 NOTE — Telephone Encounter (Signed)
Per conversation with Dr. Fletcher Anon regarding her symptoms he will see her tomorrow at 2 pm after her echo  Dr. Rockey Situ is aware

## 2015-02-01 NOTE — Telephone Encounter (Signed)
Discussed reason for echo with patients daughter  She stated she understood and is just worried because her mother is so fatigued and sob She really wants to have echo as soon as possible

## 2015-02-02 ENCOUNTER — Ambulatory Visit (INDEPENDENT_AMBULATORY_CARE_PROVIDER_SITE_OTHER): Payer: Commercial Managed Care - HMO | Admitting: Cardiovascular Disease

## 2015-02-02 ENCOUNTER — Other Ambulatory Visit: Payer: Self-pay

## 2015-02-02 ENCOUNTER — Encounter: Payer: Self-pay | Admitting: Cardiovascular Disease

## 2015-02-02 ENCOUNTER — Other Ambulatory Visit (INDEPENDENT_AMBULATORY_CARE_PROVIDER_SITE_OTHER): Payer: Commercial Managed Care - HMO

## 2015-02-02 VITALS — BP 169/84 | HR 80 | Ht 64.0 in | Wt 147.2 lb

## 2015-02-02 DIAGNOSIS — I429 Cardiomyopathy, unspecified: Secondary | ICD-10-CM

## 2015-02-02 DIAGNOSIS — I10 Essential (primary) hypertension: Secondary | ICD-10-CM

## 2015-02-02 DIAGNOSIS — I5032 Chronic diastolic (congestive) heart failure: Secondary | ICD-10-CM | POA: Diagnosis not present

## 2015-02-02 DIAGNOSIS — R0602 Shortness of breath: Secondary | ICD-10-CM

## 2015-02-02 DIAGNOSIS — I2510 Atherosclerotic heart disease of native coronary artery without angina pectoris: Secondary | ICD-10-CM

## 2015-02-02 DIAGNOSIS — Z95810 Presence of automatic (implantable) cardiac defibrillator: Secondary | ICD-10-CM

## 2015-02-02 DIAGNOSIS — I5022 Chronic systolic (congestive) heart failure: Secondary | ICD-10-CM

## 2015-02-02 DIAGNOSIS — R609 Edema, unspecified: Secondary | ICD-10-CM

## 2015-02-02 MED ORDER — FUROSEMIDE 20 MG PO TABS: 20.0000 mg | ORAL_TABLET | ORAL | Status: DC

## 2015-02-02 NOTE — Assessment & Plan Note (Signed)
She is currently New York Heart Association class III. Based on symptoms and elevated pulmonary pressure on echo, she appears to be mildly elevated. Thus, I advised her to start taking furosemide 20 mg every other day. She is worried about cramping in her legs but usually her potassium is actually on the high side. Check basic metabolic profile in one week. Continue treatment with carvedilol. She is not on an ACE inhibitor due to cough and weakness in the past. She reports significant fluctuations in blood pressure and drop of systolic blood pressure to the 90s. I have not certain her blood pressure machine is accurate and I asked her to bring this with her during next visit. Consider starting treatment with an ARB upon follow-up.

## 2015-02-02 NOTE — Assessment & Plan Note (Addendum)
She has no previous symptoms of angina. Recent nuclear stress test showed no evidence of ischemia. She complains of extensive bruising and previous stenting was years ago. Thus, I discontinued Plavix.

## 2015-02-02 NOTE — Assessment & Plan Note (Signed)
She is waiting to hear from Dr. Caryl Comes about the generator replacement.

## 2015-02-02 NOTE — Assessment & Plan Note (Signed)
Blood pressure is elevated today. Consider starting an ARB.

## 2015-02-02 NOTE — Progress Notes (Signed)
HPI  Erica [name] is a 81 year old woman with nonischemic cardiomyopathy CRT-D that was implanted at [facility]  with class 2-3 congestive heart failure with left bundle branch block, coronary artery disease with stenting to her RCA, hyperlipidemia, hypertension  Previous history of bronchitis. her husband passed away earlier in 06-Mar-2012. She was admitted to my schedule to discuss results of nuclear stress test and echocardiogram. She is normally followed by Dr. Rockey Situ and Dr. Caryl Comes Cardiac catheterization in late 03/06/13 showed moderate proximal and distal RCA disease estimated at 60%. Also mild to moderate proximal to mid LAD disease. Ejection fraction estimated at greater than 55%. There is no significant change in her RCA lesions compared to 03/06/08.  distal RCA lesion with moderate stenosis. The lesions were discussed with interventional cardiology and medical management was recommended .  Previous  Echo April 2011 showing mild LV systolic dysfunction, ejection fraction 43%, mildly dilated left atrium, mild pulmonary hypertension with right ventricular systolic pressure of 33 She was seen recently by Dr. Caryl Comes with discussions to replace her device which is approaching ERI. A nuclear stress test was obtained which showed no evidence of ischemia. However, ejection fraction was reported to be 19%. I personally reviewed the nuclear stress test images today. The calculated ejection fraction was not accurate due to poor tracking of left ventricular borders. She underwent an echocardiogram today which was reviewed by me. This showed an ejection fraction of 35-40% with mild mitral regurgitation, mild to moderate aortic regurgitation, mild tricuspid regurgitation with moderate pulmonary hypertension. The patient complains of worsening dyspnea over the last few months. She takes Lasix only as needed.   Allergies  Allergen Reactions  . Ace Inhibitors Other (See Comments)    Reaction unknown  . Amlodipine-Atorvastatin  Other (See Comments)    Reaction unknown  . Ciprofloxacin Other (See Comments)    Reaction unknown  . Quinolones Other (See Comments)    Reaction unknown     Current Outpatient Prescriptions on File Prior to Visit  Medication Sig Dispense Refill  . albuterol (PROVENTIL HFA;VENTOLIN HFA) 108 (90 BASE) MCG/ACT inhaler Inhale 2 puffs into the lungs every 6 (six) hours as needed for wheezing or shortness of breath. 1 Inhaler 0  . Ascorbic Acid (VITAMIN C) 500 MG tablet Take 500 mg by mouth as needed.     Marland Kitchen aspirin 81 MG EC tablet Take 81 mg by mouth daily.      Marland Kitchen CALCIUM-VITAMIN D-VITAMIN K PO Take by mouth. Takes 1 tablet daily.    . carvedilol (COREG) 6.25 MG tablet TAKE 1 TABLET (6.25 MG TOTAL) BY MOUTH 2 (TWO) TIMES DAILY. 90 tablet 3  . levothyroxine (SYNTHROID, LEVOTHROID) 75 MCG tablet Take 75 mcg by mouth daily.    Marland Kitchen LORazepam (ATIVAN) 1 MG tablet Take 1 mg by mouth at bedtime as needed for sleep.     Marland Kitchen omeprazole (PRILOSEC) 20 MG capsule Take 20 mg by mouth as needed.    . pravastatin (PRAVACHOL) 10 MG tablet Take 10 mg by mouth daily.  0  . vitamin B-12 (CYANOCOBALAMIN) 1000 MCG tablet Take 1,000 mcg by mouth daily.       No current facility-administered medications on file prior to visit.     Past Medical History  Diagnosis Date  . Nonischemic cardiomyopathy     s/p Medtronic CRT-D, NYHA II-III at baseline  . Coronary artery disease     a. s/p prior stent-RCA b. borderline CAD on 04/2013 cath  . History of MI (myocardial  infarction)   . Dyslipidemia   . Hypertension   . COPD (chronic obstructive pulmonary disease)   . Breast cancer   . Hypothyroidism   . Basal cell carcinoma 2011  . Respiratory infection   . Vitamin D deficiency      Past Surgical History  Procedure Laterality Date  . Cardiac defibrillator placement    . Insert / replace / remove pacemaker    . Mastectomy    . Abdominal hysterectomy    . Basal cell carcinoma excision      Monh's procedure  .  Cardiac catheterization  2014     Family History  Problem Relation Age of Onset  . Cancer Other   . Coronary artery disease Other   . Hypertension Other   . Hypertension Mother   . Hypertension Father      History   Social History  . Marital Status: Married    Spouse Name: N/A  . Number of Children: N/A  . Years of Education: N/A   Occupational History  . Not on file.   Social History Main Topics  . Smoking status: Never Smoker   . Smokeless tobacco: Never Used  . Alcohol Use: No  . Drug Use: No  . Sexual Activity: Not on file   Other Topics Concern  . Not on file   Social History Narrative      PHYSICAL EXAM   BP 169/84 mmHg  Pulse 80  Ht 5\' 4"  (1.626 m)  Wt 147 lb 4 oz (66.792 kg)  BMI 25.26 kg/m2  SpO2 97% Constitutional: She is oriented to person, place, and time. She appears well-developed and well-nourished. No distress.  HENT: No nasal discharge.  Head: Normocephalic and atraumatic.  Eyes: Pupils are equal and round. No discharge.  Neck: Normal range of motion. Neck supple. No JVD present. No thyromegaly present.  Cardiovascular: Normal rate, regular rhythm, normal heart sounds. Exam reveals no gallop and no friction rub. No murmur heard.  Pulmonary/Chest: Effort normal and breath sounds normal. No stridor. No respiratory distress. She has no wheezes. She has no rales. She exhibits no tenderness.  Abdominal: Soft. Bowel sounds are normal. She exhibits no distension. There is no tenderness. There is no rebound and no guarding.  Musculoskeletal: Normal range of motion. She exhibits no edema and no tenderness.  Neurological: She is [name] and oriented to person, place, and time. Coordination normal.  Skin: Skin is warm and dry. No rash noted. She is not diaphoretic. No erythema. No pallor.  Psychiatric: She has a normal mood and affect. Her behavior is normal. Judgment and thought content normal.      ASSESSMENT AND PLAN

## 2015-02-02 NOTE — Patient Instructions (Signed)
Please take Lasix 20 mg every other day Please stop Plavix  Please come in for labs in 1 week: BMET  Please keep your follow up appointment with Dr. Rockey Situ

## 2015-02-08 ENCOUNTER — Other Ambulatory Visit (INDEPENDENT_AMBULATORY_CARE_PROVIDER_SITE_OTHER): Payer: Commercial Managed Care - HMO

## 2015-02-08 DIAGNOSIS — R609 Edema, unspecified: Secondary | ICD-10-CM

## 2015-02-09 ENCOUNTER — Telehealth: Payer: Self-pay | Admitting: *Deleted

## 2015-02-09 DIAGNOSIS — I5022 Chronic systolic (congestive) heart failure: Secondary | ICD-10-CM

## 2015-02-09 DIAGNOSIS — I5032 Chronic diastolic (congestive) heart failure: Secondary | ICD-10-CM

## 2015-02-09 DIAGNOSIS — I2510 Atherosclerotic heart disease of native coronary artery without angina pectoris: Secondary | ICD-10-CM

## 2015-02-09 DIAGNOSIS — I10 Essential (primary) hypertension: Secondary | ICD-10-CM

## 2015-02-09 LAB — BASIC METABOLIC PANEL
BUN / CREAT RATIO: 14 (ref 11–26)
BUN: 18 mg/dL (ref 8–27)
CO2: 25 mmol/L (ref 18–29)
CREATININE: 1.33 mg/dL — AB (ref 0.57–1.00)
Calcium: 8.6 mg/dL — ABNORMAL LOW (ref 8.7–10.3)
Chloride: 92 mmol/L — ABNORMAL LOW (ref 97–108)
GFR calc Af Amer: 43 mL/min/{1.73_m2} — ABNORMAL LOW (ref >59)
GFR calc non Af Amer: 37 mL/min/{1.73_m2} — ABNORMAL LOW (ref >59)
Glucose: 108 mg/dL — ABNORMAL HIGH (ref 65–99)
Potassium: 5.1 mmol/L (ref 3.5–5.2)
SODIUM: 128 mmol/L — AB (ref 134–144)

## 2015-02-09 NOTE — Telephone Encounter (Signed)
Afternoon labs abnormal. Discussed with Dr. Caryl Comes. He has ordered repeat labs in one (1) week. Attempted to call pt unsuccessfully. LMTCB. Routed to [employer].

## 2015-02-12 NOTE — Telephone Encounter (Signed)
Patients daughter called to reschedule lab as ordered by Dr. Caryl Comes

## 2015-02-16 ENCOUNTER — Other Ambulatory Visit (INDEPENDENT_AMBULATORY_CARE_PROVIDER_SITE_OTHER): Payer: Commercial Managed Care - HMO | Admitting: *Deleted

## 2015-02-16 ENCOUNTER — Other Ambulatory Visit: Payer: Commercial Managed Care - HMO

## 2015-02-16 DIAGNOSIS — I5032 Chronic diastolic (congestive) heart failure: Secondary | ICD-10-CM

## 2015-02-16 DIAGNOSIS — I10 Essential (primary) hypertension: Secondary | ICD-10-CM | POA: Diagnosis not present

## 2015-02-16 DIAGNOSIS — I5022 Chronic systolic (congestive) heart failure: Secondary | ICD-10-CM

## 2015-02-17 LAB — BASIC METABOLIC PANEL
BUN/Creatinine Ratio: 18 (ref 11–26)
BUN: 20 mg/dL (ref 8–27)
CHLORIDE: 93 mmol/L — AB (ref 97–108)
CO2: 25 mmol/L (ref 18–29)
Calcium: 9.2 mg/dL (ref 8.7–10.3)
Creatinine, Ser: 1.12 mg/dL — ABNORMAL HIGH (ref 0.57–1.00)
GFR calc Af Amer: 53 mL/min/{1.73_m2} — ABNORMAL LOW (ref >59)
GFR calc non Af Amer: 46 mL/min/{1.73_m2} — ABNORMAL LOW (ref >59)
Glucose: 100 mg/dL — ABNORMAL HIGH (ref 65–99)
Potassium: 5.3 mmol/L — ABNORMAL HIGH (ref 3.5–5.2)
SODIUM: 130 mmol/L — AB (ref 134–144)

## 2015-02-20 ENCOUNTER — Other Ambulatory Visit: Payer: Commercial Managed Care - HMO

## 2015-02-22 NOTE — Telephone Encounter (Signed)
This encounter was created in error - please disregard.

## 2015-02-27 ENCOUNTER — Ambulatory Visit: Payer: Commercial Managed Care - HMO | Admitting: Cardiovascular Disease

## 2015-03-01 ENCOUNTER — Ambulatory Visit (INDEPENDENT_AMBULATORY_CARE_PROVIDER_SITE_OTHER): Payer: Commercial Managed Care - HMO | Admitting: Cardiovascular Disease

## 2015-03-01 ENCOUNTER — Encounter: Payer: Self-pay | Admitting: Cardiovascular Disease

## 2015-03-01 VITALS — BP 130/60 | HR 87 | Ht 62.0 in | Wt 147.0 lb

## 2015-03-01 DIAGNOSIS — I2510 Atherosclerotic heart disease of native coronary artery without angina pectoris: Secondary | ICD-10-CM

## 2015-03-01 DIAGNOSIS — R0602 Shortness of breath: Secondary | ICD-10-CM

## 2015-03-01 DIAGNOSIS — I5022 Chronic systolic (congestive) heart failure: Secondary | ICD-10-CM | POA: Diagnosis not present

## 2015-03-01 DIAGNOSIS — E782 Mixed hyperlipidemia: Secondary | ICD-10-CM

## 2015-03-01 DIAGNOSIS — Z95810 Presence of automatic (implantable) cardiac defibrillator: Secondary | ICD-10-CM

## 2015-03-01 DIAGNOSIS — I10 Essential (primary) hypertension: Secondary | ICD-10-CM

## 2015-03-01 DIAGNOSIS — R531 Weakness: Secondary | ICD-10-CM

## 2015-03-01 MED ORDER — LOSARTAN POTASSIUM 25 MG PO TABS: 25.0000 mg | ORAL_TABLET | Freq: Every day | ORAL | Status: DC

## 2015-03-01 NOTE — Assessment & Plan Note (Signed)
Encouraged her to stay on her pravastatin 10 mg daily

## 2015-03-01 NOTE — Progress Notes (Signed)
Patient ID: Erica [name], female    DOB: 20-Sep-1931, 81 y.o.   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 her husband passed away earlier in 03/16/2012. She presents for routine followup a recent echocardiogram in discussion of her systolic dysfunction Previous x-rays suggesting chronic interstitial disease  Previous look at her ejection fraction over the past 8-10 years shows a history of depressed ejection fraction around 40% Echocardiogram done at [facility] in 2007-03-17 documented EF of 40% Echocardiogram 03-16-10 done at [facility] documented EF of 40% Most recent echocardiogram in March 17, 2015 documents ejection fraction 35-40%. This echocardiogram did note moderate to severely elevated right ventricular systolic pressures estimated at 65 mmHg. Recent Myoview documenting a lower ejection fraction (19%) likely erroneous given echocardiogram result shortly following the perfusion scan Stress test showed no ischemia.   Following the echo and stress test, she was seen by Dr.  Fletcher Anon.  It was not felt that she needed cardiac catheterization given no ischemia on stress test. For the high right heart pressures on recent echocardiogram, she was started on Lasix 20 mg every other day. No significant change in her weight in follow-up today. She reports that she drink significant fluids. She continues to have mild shortness of breath on exertion Her biggest complaint is her energy, fatigue, leg strength. This was a issue for for which she participated in cardiac rehabilitation with improvement of her symptoms. She has not been exercising as she did in the past.  Legs feel weak when she goes to [facility], unable to walk very far  EKG on today's  visit shows paced rhythm, rate 86 bpm   other past medical history Cardiac catheterization in late 2013-03-16 showed moderate proximal and distal RCA disease estimated at 60%. Also mild to moderate proximal to mid LAD disease. Ejection fraction estimated at greater than 55%. There is no significant change in her RCA lesions compared to 2008/03/16.  distal RCA lesion with moderate stenosis. The lesions were discussed with interventional cardiology and medical management was recommended .  Previous  Echo April 2011 showing mild LV systolic dysfunction, ejection fraction 43%, mildly dilated left atrium, mild pulmonary hypertension with right ventricular systolic pressure of 33  Stress test April 2011 showing normal study with normal ejection fraction.  This was a lexiscan.  cardiac catheterization in January 2008 showing 40% restenosis of mid RCA stent and 50% mid LAD disease. Notes indicate 2 stents in her RCA.  Labs from 12/28/2013 TSH 4.47, BNP 1200 Chest x-ray with bilateral chronic interstitial disease     Allergies  Allergen Reactions  . Ace Inhibitors Other (See Comments)    Reaction unknown  . Amlodipine-Atorvastatin Other (See Comments)    Reaction unknown  . Ciprofloxacin Other (See Comments)    Reaction unknown  . Quinolones Other (See Comments)    Reaction unknown    Outpatient Encounter Prescriptions as of 03/01/2015  Medication Sig  . albuterol (PROVENTIL HFA;VENTOLIN HFA) 108 (90 BASE) MCG/ACT inhaler Inhale 2 puffs into the lungs every 6 (six) hours as needed for wheezing or shortness of breath.  . Ascorbic Acid (VITAMIN C) 500 MG tablet Take 500 mg by mouth as needed.   Marland Kitchen aspirin 81 MG EC tablet Take 81 mg by mouth daily.    Marland Kitchen  CALCIUM-VITAMIN D-VITAMIN K PO Take by mouth. Takes 1 tablet daily.  . carvedilol (COREG) 6.25 MG tablet TAKE 1 TABLET (6.25 MG TOTAL) BY MOUTH 2 (TWO) TIMES DAILY.  . furosemide (LASIX) 20 MG tablet Take 1 tablet (20 mg total) by mouth every other day.  .  levothyroxine (SYNTHROID, LEVOTHROID) 75 MCG tablet Take 75 mcg by mouth daily.  Marland Kitchen LORazepam (ATIVAN) 1 MG tablet Take 1 mg by mouth at bedtime as needed for sleep.   . Multiple Vitamins-Minerals (MULTIVITAMIN GUMMIES ADULT PO) Take by mouth daily.  Marland Kitchen omeprazole (PRILOSEC) 20 MG capsule Take 20 mg by mouth as needed.  . pravastatin (PRAVACHOL) 10 MG tablet Take 10 mg by mouth daily.  . vitamin B-12 (CYANOCOBALAMIN) 1000 MCG tablet Take 1,000 mcg by mouth daily.    Marland Kitchen losartan (COZAAR) 25 MG tablet Take 1 tablet (25 mg total) by mouth daily.    Past Medical History  Diagnosis Date  . Nonischemic cardiomyopathy     s/p Medtronic CRT-D, NYHA II-III at baseline  . Coronary artery disease     a. s/p prior stent-RCA b. borderline CAD on 04/2013 cath  . History of MI (myocardial infarction)   . Dyslipidemia   . Hypertension   . COPD (chronic obstructive pulmonary disease)   . Breast cancer   . Hypothyroidism   . Basal cell carcinoma 2011  . Respiratory infection   . Vitamin D deficiency     Past Surgical History  Procedure Laterality Date  . Cardiac defibrillator placement    . Insert / replace / remove pacemaker    . Mastectomy    . Abdominal hysterectomy    . Basal cell carcinoma excision      Monh's procedure  . Cardiac catheterization  2014    Social History  reports that she has never smoked. She has never used smokeless tobacco. She reports that she does not drink alcohol or use illicit drugs.  Family History family history includes Cancer in her other; Coronary artery disease in her other; Hypertension in her father, mother, and other.   Review of Systems  Constitutional: Positive for fatigue.  Respiratory: Positive for shortness of breath.   Cardiovascular: Negative.   Gastrointestinal: Negative.   Musculoskeletal: Positive for gait problem.       Leg weakness  Neurological: Positive for weakness.  Hematological: Negative.   Psychiatric/Behavioral: Negative.   All  other systems reviewed and are negative.   BP 130/60 mmHg  Pulse 87  Ht 5\' 2"  (1.575 m)  Wt 147 lb (66.679 kg)  BMI 26.88 kg/m2  Physical Exam  Constitutional: She is oriented to person, place, and time. She appears well-developed and well-nourished.  HENT:  Head: Normocephalic.  Nose: Nose normal.  Mouth/Throat: Oropharynx is clear and moist.  Eyes: Conjunctivae are normal. Pupils are equal, round, and reactive to light.  Neck: Normal range of motion. Neck supple. No JVD present.  Cardiovascular: Normal rate, regular rhythm, S1 normal, S2 normal, normal heart sounds and intact distal pulses.  Exam reveals no gallop and no friction rub.   No murmur heard. Pulmonary/Chest: Effort normal and breath sounds normal. No respiratory distress. She has no wheezes. She has no rales. She exhibits no tenderness.  Abdominal: Soft. Bowel sounds are normal. She exhibits no distension. There is no tenderness.  Musculoskeletal: Normal range of motion. She exhibits no edema or tenderness.  Lymphadenopathy:    She has no cervical adenopathy.  Neurological: She is [name] and oriented to person, place,  and time. Coordination normal.  Skin: Skin is warm and dry. No rash noted. No erythema.  Psychiatric: She has a normal mood and affect. Her behavior is normal. Judgment and thought content normal.    Assessment and Plan  Nursing note and vitals reviewed.

## 2015-03-01 NOTE — Assessment & Plan Note (Signed)
Continue current medications, losartan 25 mg added

## 2015-03-01 NOTE — Assessment & Plan Note (Signed)
Weakness has been a chronic issue. Previously addressed with cardiac rehabilitation. She did well at that time. We have offered that again but she has declined at this time

## 2015-03-01 NOTE — Patient Instructions (Addendum)
You are doing well.  Please take lasix/furosemide daily Try not to drink too much fluids  Please start losartan 25 mg once a day (for heart pump)  Please call us if you have new issues that need to be addressed before your next appt.  Your physician wants you to follow-up in: 6 months.  You will receive a reminder letter in the mail two months in advance. If you don't receive a letter, please call our office to schedule the follow-up appointment.

## 2015-03-01 NOTE — Assessment & Plan Note (Signed)
Ejection fraction appears around her baseline 35-40% as it has been for the past 8 years. Previously started on Lasix every other day. We'll increase this up to 20 mg daily. Also start losartan 25 mg daily, recommended she continue on her Coreg 6.25 mill grams twice a day

## 2015-03-01 NOTE — Assessment & Plan Note (Signed)
Currently with defibrillator. Managed by Dr. Caryl Comes.  No further workup needed prior to device change

## 2015-03-01 NOTE — Assessment & Plan Note (Signed)
Recent negative stress test. I suspect her symptoms of fatigue, leg weakness are primarily from deconditioning. We have offered her cardiac rehabilitation.

## 2015-03-14 ENCOUNTER — Telehealth: Payer: Self-pay | Admitting: *Deleted

## 2015-03-14 DIAGNOSIS — I428 Other cardiomyopathies: Secondary | ICD-10-CM

## 2015-03-14 NOTE — Telephone Encounter (Signed)
Patient calling to see when her ICD generator change out can take place. No further workup needed from ischemia perspective Sounds like patient is ready to go

## 2015-03-14 NOTE — Telephone Encounter (Signed)
Pt daughter calling for last time patient she was seen she was told that dr Rockey Situ would talk to dr Caryl Comes about pt getting a pace maker  They have not heard anything so she is just checking on this.  Please advise.

## 2015-03-15 NOTE — Telephone Encounter (Signed)
I spoke with the patient's daughter. They would like to schedule the patient's procedure on a Friday so the patient's [place] come [place].  I spoke with Sherri, and 5/13 may be an option.  I am unable to schedule with [facility] today due to the CUPID conversion.  I called the patient's daughter back and left a message on her voice mail that I will call her back on Monday to confirm her date and time for her procedure.

## 2015-03-15 NOTE — Telephone Encounter (Signed)
I spoke with Sherri in [place]. Some available dates for the patient to have her generator change are:  Wed- 5/4 or Wed- 5/18. I left a message for [name] with these dates and to please call me to schedule.

## 2015-03-19 ENCOUNTER — Encounter: Payer: Self-pay | Admitting: *Deleted

## 2015-03-19 NOTE — Addendum Note (Signed)
Addended by: Alvis Lemmings C on: 03/19/2015 12:45 PM   Modules accepted: Orders

## 2015-03-19 NOTE — Telephone Encounter (Signed)
I spoke with the patient's daughter. She is aware of the patient's date/ time for her procedure. Verbal instructions given. I explained to her that I will leave a copy of the instructions at the front desk of the [place] office to be picked up when she comes for labs on 03/23/15.

## 2015-03-19 NOTE — Telephone Encounter (Signed)
Generator change scheduled for Friday 03/30/15 at 12:00 pm. I have left a message for the patient's daughter to call.

## 2015-03-20 ENCOUNTER — Ambulatory Visit: Payer: Commercial Managed Care - HMO | Admitting: Cardiovascular Disease

## 2015-03-23 ENCOUNTER — Other Ambulatory Visit (INDEPENDENT_AMBULATORY_CARE_PROVIDER_SITE_OTHER): Payer: Commercial Managed Care - HMO | Admitting: *Deleted

## 2015-03-23 DIAGNOSIS — I429 Cardiomyopathy, unspecified: Secondary | ICD-10-CM

## 2015-03-23 DIAGNOSIS — I428 Other cardiomyopathies: Secondary | ICD-10-CM

## 2015-03-23 NOTE — Telephone Encounter (Signed)
Left message to call back. Need to reschedule Bi-V ICD Gen change from 5/13.

## 2015-03-24 LAB — CBC WITH DIFFERENTIAL/PLATELET
BASOS: 0 %
Basophils Absolute: 0 10*3/uL (ref 0.0–0.2)
EOS (ABSOLUTE): 0.4 10*3/uL (ref 0.0–0.4)
Eos: 8 %
HEMATOCRIT: 33.9 % — AB (ref 34.0–46.6)
Hemoglobin: 11.4 g/dL (ref 11.1–15.9)
Immature Grans (Abs): 0 10*3/uL (ref 0.0–0.1)
Immature Granulocytes: 0 %
Lymphocytes Absolute: 1.7 10*3/uL (ref 0.7–3.1)
Lymphs: 37 %
MCH: 30.1 pg (ref 26.6–33.0)
MCHC: 33.6 g/dL (ref 31.5–35.7)
MCV: 89 fL (ref 79–97)
Monocytes Absolute: 0.3 10*3/uL (ref 0.1–0.9)
Monocytes: 7 %
NEUTROS ABS: 2.2 10*3/uL (ref 1.4–7.0)
Neutrophils: 48 %
Platelets: 215 10*3/uL (ref 150–379)
RBC: 3.79 x10E6/uL (ref 3.77–5.28)
RDW: 14 % (ref 12.3–15.4)
WBC: 4.6 10*3/uL (ref 3.4–10.8)

## 2015-03-24 LAB — BASIC METABOLIC PANEL
BUN/Creatinine Ratio: 14 (ref 11–26)
BUN: 16 mg/dL (ref 8–27)
CHLORIDE: 99 mmol/L (ref 97–108)
CO2: 22 mmol/L (ref 18–29)
CREATININE: 1.14 mg/dL — AB (ref 0.57–1.00)
Calcium: 9 mg/dL (ref 8.7–10.3)
GFR calc Af Amer: 52 mL/min/{1.73_m2} — ABNORMAL LOW (ref >59)
GFR calc non Af Amer: 45 mL/min/{1.73_m2} — ABNORMAL LOW (ref >59)
Glucose: 78 mg/dL (ref 65–99)
Potassium: 5 mmol/L (ref 3.5–5.2)
Sodium: 137 mmol/L (ref 134–144)

## 2015-03-24 LAB — PROTIME-INR
INR: 1.1 (ref 0.8–1.2)
Prothrombin Time: 11 s (ref 9.1–12.0)

## 2015-03-26 ENCOUNTER — Telehealth: Payer: Self-pay | Admitting: Internal Medicine

## 2015-03-26 NOTE — Telephone Encounter (Signed)
New message     Daughter returning your call from Friday.

## 2015-03-27 ENCOUNTER — Encounter: Payer: Self-pay | Admitting: Internal Medicine

## 2015-03-27 NOTE — Telephone Encounter (Signed)
Follow Up  Daughter is looking towards 5/19 to schedule the battery replacement. Please call back to confirm the date//sr

## 2015-03-27 NOTE — Telephone Encounter (Signed)
New Message    Patients daughter is returning the nurses call, please give them a call back.

## 2015-03-27 NOTE — Telephone Encounter (Signed)
Lmtcb.   Rescheduled gen change to 5/19 per request.  Will review instructions again, new time, and schedule wound check when she calls back.

## 2015-03-28 NOTE — Telephone Encounter (Signed)
This encounter was created in error - please disregard.

## 2015-03-28 NOTE — Telephone Encounter (Signed)
Informed procedure rescheduled to the 19th. Arrive at hospital at noon for 2 pm procedure. Same instructions as before. Will discuss with device clinic about wound check follow up in [place] and call [name] back with options. dtr verbalized understanding and  is agreeable to plan.

## 2015-03-28 NOTE — Telephone Encounter (Signed)
Bo Mcclintock at 03/27/2015 1:13 PM     Status: Signed       Expand All Collapse All   New Message    Patients daughter is returning the nurses call, please give them a call back.

## 2015-03-29 NOTE — Telephone Encounter (Signed)
Arranged wound check follow up for 5/31 at 8:30 am in [place] office. Dtr verbalized understanding and agreeable to plan.

## 2015-04-02 ENCOUNTER — Telehealth: Payer: Self-pay

## 2015-04-02 NOTE — Telephone Encounter (Signed)
Pt called to inquire about potassium level at 03/23/15 labs. Indicated she had been "working on it" due to upcoming surgery. Potassium level 5.0

## 2015-04-03 NOTE — Telephone Encounter (Signed)
Left message explaining procedure time changed slightly -- be at hospital at 12:30 pm instead.

## 2015-04-04 NOTE — Telephone Encounter (Signed)
Patient's dtr understands to be at the hospital at 12:30 tomorrow.

## 2015-04-05 ENCOUNTER — Encounter (HOSPITAL_COMMUNITY)
Admission: RE | Disposition: A | Discharge status: Home / Self Care | Payer: Commercial Managed Care - HMO | Source: Ambulatory Visit | Attending: Internal Medicine

## 2015-04-05 ENCOUNTER — Ambulatory Visit (HOSPITAL_COMMUNITY)
Admission: RE | Admit: 2015-04-05 | Discharge: 2015-04-05 | Disposition: A | Discharge status: Home / Self Care | Payer: Commercial Managed Care - HMO | Source: Ambulatory Visit | Attending: Internal Medicine | Admitting: Internal Medicine

## 2015-04-05 DIAGNOSIS — I2510 Atherosclerotic heart disease of native coronary artery without angina pectoris: Secondary | ICD-10-CM | POA: Insufficient documentation

## 2015-04-05 DIAGNOSIS — I447 Left bundle-branch block, unspecified: Secondary | ICD-10-CM | POA: Insufficient documentation

## 2015-04-05 DIAGNOSIS — Z4502 Encounter for adjustment and management of automatic implantable cardiac defibrillator: Secondary | ICD-10-CM | POA: Insufficient documentation

## 2015-04-05 DIAGNOSIS — E785 Hyperlipidemia, unspecified: Secondary | ICD-10-CM | POA: Insufficient documentation

## 2015-04-05 DIAGNOSIS — J449 Chronic obstructive pulmonary disease, unspecified: Secondary | ICD-10-CM | POA: Insufficient documentation

## 2015-04-05 DIAGNOSIS — I509 Heart failure, unspecified: Secondary | ICD-10-CM | POA: Diagnosis not present

## 2015-04-05 DIAGNOSIS — Z955 Presence of coronary angioplasty implant and graft: Secondary | ICD-10-CM | POA: Diagnosis not present

## 2015-04-05 DIAGNOSIS — Z7982 Long term (current) use of aspirin: Secondary | ICD-10-CM | POA: Diagnosis not present

## 2015-04-05 DIAGNOSIS — E039 Hypothyroidism, unspecified: Secondary | ICD-10-CM | POA: Diagnosis not present

## 2015-04-05 DIAGNOSIS — Z95810 Presence of automatic (implantable) cardiac defibrillator: Secondary | ICD-10-CM | POA: Diagnosis present

## 2015-04-05 DIAGNOSIS — I428 Other cardiomyopathies: Secondary | ICD-10-CM

## 2015-04-05 DIAGNOSIS — I420 Dilated cardiomyopathy: Secondary | ICD-10-CM | POA: Insufficient documentation

## 2015-04-05 DIAGNOSIS — I10 Essential (primary) hypertension: Secondary | ICD-10-CM | POA: Diagnosis not present

## 2015-04-05 DIAGNOSIS — I429 Cardiomyopathy, unspecified: Secondary | ICD-10-CM

## 2015-04-05 DIAGNOSIS — Z79899 Other long term (current) drug therapy: Secondary | ICD-10-CM | POA: Insufficient documentation

## 2015-04-05 HISTORY — PX: EP IMPLANTABLE DEVICE: SHX172B

## 2015-04-05 LAB — SURGICAL PCR SCREEN
MRSA, PCR: NEGATIVE
Staphylococcus aureus: NEGATIVE

## 2015-04-05 SURGERY — ICD/BIV ICD GENERATOR CHANGEOUT

## 2015-04-05 MED ORDER — LIDOCAINE HCL (PF) 1 % IJ SOLN
INTRAMUSCULAR | Status: AC
  Filled 2015-04-05: qty 60

## 2015-04-05 MED ORDER — SODIUM CHLORIDE 0.9 % IR SOLN
80.0000 mg | Status: DC
  Filled 2015-04-05: qty 2

## 2015-04-05 MED ORDER — SODIUM CHLORIDE 0.9 % IV SOLN
INTRAVENOUS | Status: DC
  Administered 2015-04-05: 13:00:00 via INTRAVENOUS

## 2015-04-05 MED ORDER — FENTANYL CITRATE (PF) 100 MCG/2ML IJ SOLN
INTRAMUSCULAR | Status: DC | PRN
Start: 2015-04-05 — End: 2015-04-05
  Administered 2015-04-05: 25 ug via INTRAVENOUS

## 2015-04-05 MED ORDER — FENTANYL CITRATE (PF) 100 MCG/2ML IJ SOLN
INTRAMUSCULAR | Status: AC
  Filled 2015-04-05: qty 2

## 2015-04-05 MED ORDER — DEXTROSE 5 % IV SOLN
2.0000 g | INTRAVENOUS | Status: DC | PRN
  Administered 2015-04-05: 2 g via INTRAVENOUS

## 2015-04-05 MED ORDER — MIDAZOLAM HCL 5 MG/5ML IJ SOLN
INTRAMUSCULAR | Status: DC | PRN
  Administered 2015-04-05 (×2): 1 mg via INTRAVENOUS

## 2015-04-05 MED ORDER — CEFAZOLIN SODIUM-DEXTROSE 2-3 GM-% IV SOLR: 2.0000 g | INTRAVENOUS | Status: DC

## 2015-04-05 MED ORDER — MIDAZOLAM HCL 5 MG/5ML IJ SOLN
INTRAMUSCULAR | Status: AC
  Filled 2015-04-05: qty 5

## 2015-04-05 MED ORDER — MUPIROCIN 2 % EX OINT
1.0000 "application " | TOPICAL_OINTMENT | Freq: Once | CUTANEOUS | Status: AC
  Administered 2015-04-05: 1 via TOPICAL

## 2015-04-05 MED ORDER — CEFAZOLIN SODIUM-DEXTROSE 2-3 GM-% IV SOLR
INTRAVENOUS | Status: AC
  Filled 2015-04-05: qty 50

## 2015-04-05 MED ORDER — MUPIROCIN 2 % EX OINT
TOPICAL_OINTMENT | CUTANEOUS | Status: AC
  Administered 2015-04-05: 1 via TOPICAL
  Filled 2015-04-05: qty 22

## 2015-04-05 MED ORDER — CHLORHEXIDINE GLUCONATE 4 % EX LIQD: 60.0000 mL | Freq: Once | CUTANEOUS | Status: DC

## 2015-04-05 SURGICAL SUPPLY — 6 items
CABLE SURGICAL S-101-97-12 (CABLE) ×3 IMPLANT
ELECT DEFIB PAD ADLT CADENCE (PAD) IMPLANT
ICD VIVA XT CRT-D DTBA1D1 (ICD Generator) ×2 IMPLANT
PAD DEFIB LIFELINK (PAD) ×3 IMPLANT
SHIELD RADPAD SCOOP 12X17 (MISCELLANEOUS) ×3 IMPLANT
TRAY PACEMAKER INSERTION (CUSTOM PROCEDURE TRAY) ×3 IMPLANT

## 2015-04-05 NOTE — H&P (Addendum)
Patient Care Team: Kirk Ruths, MD as PCP - General (Internal Medicine)   HPI  Erica [name] is a 81 y.o. female Admitted for replacement of her  CRT-D generator that was implanted at [facility] for severe ischemic/nonischemic cardiomyopathy and class 2-3 congestive heart failure with left bundle branch block. The patient's and family are not sure that there was significant benefit associated with the implantation  She has had no known arrhythmias.  Echo 2011 showed mild LV dysfunction with an improvement EF-43%   Stress 2011 demonstrated "normal left ventricular function" EF measured at 79%.    She underwent cath 6/14 >>normal LV FUNCTION without obstructive disease  At last visit she was noted to have reached ERI   We discussed replacement of CRT +/- ICD based on hx of normalization of LVEF and no interval arrhythmias.    Echo though 3/16 EF 35-40%   She has problems with orthostasis  She continues with complaints of DOE with even modest exertion and worsening fatigue ; This is unassociated with chest pain  Past Medical History  Diagnosis Date  . Nonischemic cardiomyopathy     s/p Medtronic CRT-D, NYHA II-III at baseline  . Coronary artery disease     a. s/p prior stent-RCA b. borderline CAD on 04/2013 cath  . History of MI (myocardial infarction)   . Dyslipidemia   . Hypertension   . COPD (chronic obstructive pulmonary disease)   . Breast cancer   . Hypothyroidism   . Basal cell carcinoma 2011  . Respiratory infection   . Vitamin D deficiency       Medication List    ASK your doctor about these medications        albuterol 108 (90 BASE) MCG/ACT inhaler  Commonly known as:  PROVENTIL HFA;VENTOLIN HFA  Inhale 2 puffs into the lungs every 6 (six) hours as needed for wheezing or shortness of breath.     aspirin 81 MG EC tablet  Take 81 mg by mouth daily.     carvedilol 6.25 MG tablet  Commonly known as:  COREG  TAKE 1 TABLET (6.25 MG TOTAL) BY  MOUTH 2 (TWO) TIMES DAILY.     furosemide 20 MG tablet  Commonly known as:  LASIX  Take 1 tablet (20 mg total) by mouth every other day.     levothyroxine 75 MCG tablet  Commonly known as:  SYNTHROID, LEVOTHROID  Take 75 mcg by mouth daily.     LORazepam 1 MG tablet  Commonly known as:  ATIVAN  Take 1 mg by mouth at bedtime as needed for sleep.     losartan 25 MG tablet  Commonly known as:  COZAAR  Take 1 tablet (25 mg total) by mouth daily.     MULTIVITAMIN GUMMIES ADULT PO  Take by mouth daily.     omeprazole 20 MG capsule  Commonly known as:  PRILOSEC  Take 20 mg by mouth as needed.     pravastatin 10 MG tablet  Commonly known as:  PRAVACHOL  Take 10 mg by mouth every other day.     vitamin B-12 1000 MCG tablet  Commonly known as:  CYANOCOBALAMIN  Take 1,000 mcg by mouth daily.     vitamin C 500 MG tablet  Commonly known as:  ASCORBIC ACID  Take 500 mg by mouth as needed.         Current Facility-Administered Medications  Medication Dose Route Frequency Provider Last Rate Last Dose  .  0.9 %  sodium chloride infusion   Intravenous Continuous Amber Vertis Kelch, NP 50 mL/hr at 04/05/15 1328    . ceFAZolin (ANCEF) IVPB 2 g/50 mL premix  2 g Intravenous On Call Amber Vertis Kelch, NP      . chlorhexidine (HIBICLENS) 4 % liquid 4 application  60 mL Topical Once Amber Vertis Kelch, NP      . gentamicin (GARAMYCIN) 80 mg in sodium chloride irrigation 0.9 % 500 mL irrigation  80 mg Irrigation On Call Amber Vertis Kelch, NP        Allergies  Allergen Reactions  . Ace Inhibitors Other (See Comments)    Reaction unknown  . Amlodipine-Atorvastatin Other (See Comments)    Reaction unknown  . Ciprofloxacin Other (See Comments)    Reaction unknown  . Quinolones Other (See Comments)    Reaction unknown    Review of Systems negative except from HPI and PMH  Physical Exam BP 177/69 mmHg  Pulse 88  Temp(Src) 97.8 F (36.6 C) (Oral)  Resp 17  Ht 5\' 2"  (1.575 m)   Wt 65.318 kg (144 lb)  BMI 26.33 kg/m2  SpO2 99% Well developed and well nourished in no acute distress HENT normal E scleral and icterus clear Neck Supple JVP flat; carotids brisk and full Clear to ausculation Device pocket well healed; without hematoma or erythema.  There is no tethering  Regular rate and rhythm, no murmurs gallops or rub Soft with active bowel sounds No clubbing cyanosis  Edema Alert and oriented, grossly normal motor and sensory function Skin Warm and Dry    Assessment and  Plan  NICM  CHF\  HTN   ICD at ERI  Will [proceed with gen replacement  Their decision is to replace both ICD and CRt function  I am not sanguine that she will improve symptomatically   We have reviewed the benefits and risks of generator replacement.  These include but are not limited to lead fracture and infection.  The patient understands, agrees and is willing to proceed.

## 2015-04-05 NOTE — Interval H&P Note (Signed)
ICD Criteria  Current LVEF:35% ;Obtained > or = 1 month ago and < or = 3 months ago.  NYHA Functional Classification: Class III  Heart Failure History:  Yes, Duration of heart failure since onset is > 9 months  Non-Ischemic Dilated Cardiomyopathy History:  Yes, timeframe is > 9 months  Atrial Fibrillation/Atrial Flutter:  No.  Ventricular Tachycardia History:  No.  Cardiac Arrest History:  No  History of Syndromes with Risk of Sudden Death:  No.  Previous ICD:  Yes, ICD Type:  CRT-D, Reason for ICD:  Primary prevention.  LVEF is not available  Electrophysiology Study: No.  Prior MI: No.  PPM: No.  OSA:  No  Patient Life Expectancy of >=1 year: Yes.  Anticoagulation Therapy:  Patient is NOT on anticoagulation therapy.   Beta Blocker Therapy:  Yes.   Ace Inhibitor/ARB Therapy:  Yes.History and Physical Interval Note:  04/05/2015 2:11 PM  [place]  has presented today for surgery, with the diagnosis of cm/eri  The various methods of treatment have been discussed with the patient and family. After consideration of risks, benefits and other options for treatment, the patient has consented to  Procedure(s): BIV ICD Generator Changeout (N/A) as a surgical intervention .  The patient's history has been reviewed, patient examined, no change in status, stable for surgery.  I have reviewed the patient's chart and labs.  Questions were answered to the patient's satisfaction.     Virl Axe

## 2015-04-05 NOTE — Discharge Instructions (Signed)
Pacemaker Battery Change, Care After Refer to this sheet in the next few weeks. These instructions provide you with information on caring for yourself after your procedure. Your health care provider may also give you more specific instructions. Your treatment has been planned according to current medical practices, but problems sometimes occur. Call your health care provider if you have any problems or questions after your procedure. WHAT TO EXPECT AFTER THE PROCEDURE After your procedure, it is typical to have the following sensations:Pacemaker Battery Change A pacemaker battery usually lasts 4 to 12 years. Once or twice per year, you will be asked to visit your health care provider to have a full evaluation of your pacemaker. When a battery needs to be replaced, the entire pacemaker is replaced so that you can benefit from new circuitry and any new features that have been added to pacemakers. Most often, this procedure is very simple because the leads are already in place.  There are many things that affect how long a pacemaker battery will last, including:   The age of the pacemaker.   The number of leads (1, 2, or 3).   The pacemaker work load. If the pacemaker is helping the heart more often, the battery will not last as long as it would if the pacemaker did not need to help the heart.   Power (voltage) settings. LET [facility] CARE PROVIDER KNOW ABOUT:   Any allergies you have.   All medicines you are taking, including vitamins, herbs, eye drops, creams, and over-the-counter medicines.   Previous problems you or members of your family have had with the use of anesthetics.   Any blood disorders you have.   Previous surgeries you have had, especially since your last pacemaker placement.   Medical conditions you have.   Possibility of pregnancy, if this applies.  Symptoms of chest pain, trouble breathing, palpitations, light-headedness, or feelings of an abnormal or  irregular heartbeat. RISKS AND COMPLICATIONS  Generally, this is a safe procedure. However, as with any procedure, problems can occur and include:   Bleeding.   Bruising of the skin around where the incision was made.   Pain at the incision site.   Pulling apart of the skin at the incision site.   Infection.   Allergic reaction to anesthetics or other medicines used during the procedure.  People with diabetes may have a temporary increase in their blood sugar after any surgical procedure.  BEFORE THE PROCEDURE   Wash all of the skin around the area of the chest where the pacemaker is located.   Ask your health care provider for help with any medicine adjustments before the pacemaker is replaced.   Do not eat or drink anything after midnight on the night before the procedure or as directed by your health care provider.  Ask your health care provider if you can take a sip of water with any approved medicines the morning of the procedure. PROCEDURE   After giving medicine to numb the skin (local anesthetic), your health care provider will make a cut to reopen the pocket holding the pacemaker.   The old pacemaker will be disconnected from its leads.   The leads will be tested.   If needed, the leads will be replaced. If the leads are functioning properly, the new pacemaker may be connected to the existing leads.  A heart monitor and the pacemaker programmer will be used to make sure that the new pacemaker is working properly.  The incision site  will then be closed. A dressing will be placed over the pacemaker site. The dressing will be removed 24-48 hours afterward. AFTER THE PROCEDURE   You will be taken to a recovery area after the new pacemaker implant is completed. Your vital signs such as blood pressure, heart rate, breathing, and oxygen levels will be monitored.  Your health care provider will tell you when you will need to next test your pacemaker or when to  return to the office for follow-up for removal of stitches. Document Released: 02/11/2007 Document Revised: 03/20/2014 Document Reviewed: 05/18/2013 [facility] Patient Information 2015 [place], [place]. This information is not intended to replace advice given to you by your health care provider. Make sure you discuss any questions you have with your health care provider.   Soreness at the pacemaker site. HOME CARE INSTRUCTIONS   Keep the incision clean and dry.  Unless advised otherwise, you may shower beginning 48 hours after your procedure.  For the first week after the replacement, avoid stretching motions that pull at the incision site, and avoid heavy exercise with the arm that is on the same side as the incision.  Take medicines only as directed by your health care provider.  Keep all follow-up visits as directed by your health care provider. SEEK MEDICAL CARE IF:   You have pain at the incision site that is not relieved by over-the-counter or prescription medicine.  There is drainage or pus from the incision site.  There is swelling larger than a lime at the incision site.  You develop red streaking that extends above or below the incision site.  You feel brief, intermittent palpitations, light-headedness, or any symptoms that you feel might be related to your heart. SEEK IMMEDIATE MEDICAL CARE IF:   You experience chest pain that is different than the pain at the pacemaker site.  You experience shortness of breath.  You have palpitations or irregular heartbeat.  You have light-headedness that does not go away quickly.  You faint.  You have pain that gets worse and is not relieved by medicine. Document Released: 08/24/2013 Document Revised: 03/20/2014 Document Reviewed: 08/24/2013 [facility], [facility], [facility] Patient Information 2015 [place], [place]. This information is not intended to replace advice given to you by your health care provider. Make sure you discuss any questions you have  with your health care provider.

## 2015-04-06 ENCOUNTER — Encounter (HOSPITAL_COMMUNITY): Payer: Self-pay | Admitting: Internal Medicine

## 2015-04-06 MED FILL — Lidocaine HCl Local Preservative Free (PF) Inj 1%: INTRAMUSCULAR | Qty: 30 | Status: AC

## 2015-04-06 MED FILL — Cefazolin Sodium for IV Soln 2 GM and Dextrose 3% (50 ML): INTRAVENOUS | Qty: 50 | Status: AC

## 2015-04-17 ENCOUNTER — Ambulatory Visit (INDEPENDENT_AMBULATORY_CARE_PROVIDER_SITE_OTHER): Payer: Commercial Managed Care - HMO | Admitting: *Deleted

## 2015-04-17 DIAGNOSIS — I5022 Chronic systolic (congestive) heart failure: Secondary | ICD-10-CM

## 2015-04-17 DIAGNOSIS — Z95810 Presence of automatic (implantable) cardiac defibrillator: Secondary | ICD-10-CM | POA: Diagnosis not present

## 2015-04-17 DIAGNOSIS — I428 Other cardiomyopathies: Secondary | ICD-10-CM

## 2015-04-17 DIAGNOSIS — I429 Cardiomyopathy, unspecified: Secondary | ICD-10-CM | POA: Diagnosis not present

## 2015-04-17 LAB — CUP PACEART INCLINIC DEVICE CHECK
Battery Remaining Longevity: 120 mo
Battery Voltage: 3.1 V
Brady Statistic AP VP Percent: 0.02 %
Brady Statistic AP VS Percent: 0.01 %
Brady Statistic AS VS Percent: 1.26 %
Brady Statistic RV Percent Paced: 0.62 %
Date Time Interrogation Session: 20160531094958
HighPow Impedance: 133 Ohm
HighPow Impedance: 36 Ohm
HighPow Impedance: 47 Ohm
Lead Channel Impedance Value: 361 Ohm
Lead Channel Impedance Value: 456 Ohm
Lead Channel Pacing Threshold Amplitude: 0.5 V
Lead Channel Pacing Threshold Amplitude: 0.625 V
Lead Channel Pacing Threshold Amplitude: 0.875 V
Lead Channel Pacing Threshold Pulse Width: 0.4 ms
Lead Channel Pacing Threshold Pulse Width: 0.4 ms
Lead Channel Sensing Intrinsic Amplitude: 1.125 mV
Lead Channel Sensing Intrinsic Amplitude: 10 mV
Lead Channel Sensing Intrinsic Amplitude: 10.25 mV
Lead Channel Setting Pacing Amplitude: 2 V
Lead Channel Setting Pacing Amplitude: 2 V
Lead Channel Setting Pacing Pulse Width: 0.4 ms
Lead Channel Setting Pacing Pulse Width: 0.4 ms
Lead Channel Setting Sensing Sensitivity: 0.3 mV
MDC IDC MSMT LEADCHNL LV IMPEDANCE VALUE: 551 Ohm
MDC IDC MSMT LEADCHNL LV IMPEDANCE VALUE: 874 Ohm
MDC IDC MSMT LEADCHNL RA PACING THRESHOLD PULSEWIDTH: 0.4 ms
MDC IDC MSMT LEADCHNL RA SENSING INTR AMPL: 0.5 mV
MDC IDC MSMT LEADCHNL RV IMPEDANCE VALUE: 589 Ohm
MDC IDC SET LEADCHNL RA PACING AMPLITUDE: 1.5 V
MDC IDC SET ZONE DETECTION INTERVAL: 330 ms
MDC IDC STAT BRADY AS VP PERCENT: 98.71 %
MDC IDC STAT BRADY RA PERCENT PACED: 0.03 %
Zone Setting Detection Interval: 300 ms
Zone Setting Detection Interval: 350 ms
Zone Setting Detection Interval: 370 ms

## 2015-04-17 NOTE — Progress Notes (Signed)
Changeout wound check appointment. Dermabond removed by pt. Wound without redness or edema. Incision edges approximated, wound well healed. Normal device function. Thresholds, sensing, and impedances consistent with implant measurements. Device programmed at chronic output settings. Histogram distribution appropriate for patient and level of activity. No mode switches or ventricular arrhythmias noted. Changed VF NID from 18/24 to 30/40, VT NID from 16 to 32, monitored initial beats from 32 to 36. Patient educated about wound care, arm mobility, lifting restrictions, shock plan. Carelink 07/17/15 & ROV w/ SK/B in 72mo.

## 2015-05-14 ENCOUNTER — Other Ambulatory Visit: Payer: Self-pay

## 2015-05-14 ENCOUNTER — Encounter: Payer: Self-pay | Admitting: Internal Medicine

## 2015-05-18 ENCOUNTER — Telehealth: Payer: Self-pay | Admitting: Cardiovascular Disease

## 2015-05-18 NOTE — Telephone Encounter (Signed)
Please call patient to discuss post op (pacer change) medications she thinks they were changed and she is not taking some that she used to take.    Patient has been taking bp(no readings to give) and it averages 130-140/70-80 HR in 80's  Patient is concerned that she is not taking what she should be taking.  Patient is still having dizziness and tiredness even after procedure.  Legs and muscles are having bad cramps.  Balance is off she is running into corners.  Has pcp appt soon and will ask about this.

## 2015-05-25 NOTE — Telephone Encounter (Signed)
S/w Dr. Caryl Comes who states this is most likely not related to generator change and patient should follow up with Dr. Rockey Situ.

## 2015-05-25 NOTE — Telephone Encounter (Signed)
S/w patient regarding Dr. Olin Pia recommendations to follow up with Dr. Rockey Situ. Will have scheduling call patient with date/time. Pt. Verbalized understanding with no further questions.

## 2015-05-30 ENCOUNTER — Ambulatory Visit: Payer: Commercial Managed Care - HMO | Admitting: Cardiovascular Disease

## 2015-06-01 ENCOUNTER — Encounter: Payer: Self-pay | Admitting: Cardiovascular Disease

## 2015-06-01 ENCOUNTER — Ambulatory Visit (INDEPENDENT_AMBULATORY_CARE_PROVIDER_SITE_OTHER): Payer: Commercial Managed Care - HMO | Admitting: Cardiovascular Disease

## 2015-06-01 VITALS — BP 148/84 | HR 82 | Ht 61.0 in | Wt 147.0 lb

## 2015-06-01 DIAGNOSIS — I951 Orthostatic hypotension: Secondary | ICD-10-CM | POA: Diagnosis not present

## 2015-06-01 DIAGNOSIS — I5022 Chronic systolic (congestive) heart failure: Secondary | ICD-10-CM

## 2015-06-01 DIAGNOSIS — I429 Cardiomyopathy, unspecified: Secondary | ICD-10-CM

## 2015-06-01 DIAGNOSIS — I428 Other cardiomyopathies: Secondary | ICD-10-CM

## 2015-06-01 DIAGNOSIS — R531 Weakness: Secondary | ICD-10-CM

## 2015-06-01 DIAGNOSIS — I2510 Atherosclerotic heart disease of native coronary artery without angina pectoris: Secondary | ICD-10-CM

## 2015-06-01 DIAGNOSIS — I10 Essential (primary) hypertension: Secondary | ICD-10-CM | POA: Diagnosis not present

## 2015-06-01 DIAGNOSIS — E782 Mixed hyperlipidemia: Secondary | ICD-10-CM

## 2015-06-01 NOTE — Assessment & Plan Note (Signed)
Encouraged her to stay on her pravastatin

## 2015-06-01 NOTE — Assessment & Plan Note (Signed)
Blood pressure is well controlled on today's visit. No changes made to the medications. History of orthostasis in the past, will run blood pressure slightly high Consider entresto low-dose on her next clinic visit with holding losartan

## 2015-06-01 NOTE — Assessment & Plan Note (Signed)
Currently with no symptoms of angina. No further workup at this time. Continue current medication regimen. 

## 2015-06-01 NOTE — Progress Notes (Signed)
Patient ID: Erica [name], female    DOB: June 16, 1931, 81 y.o.   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 her husband passed away earlier in 2012/03/13. She presents for her nonischemic cardiopathy Previous x-rays suggesting chronic interstitial disease  Recent BIV ICD placement by Dr. Caryl Comes. She has recovered well with no complications. She reports that she is very shaky, balance is poor, chronically weak, no energy. This is a issue that has been discussed over the past several years No regular exercise, goes shopping once per week, otherwise relatively sedentary She is currently not taking Lasix, denies any lower extremity swelling or shortness of breath  Prior ejection fraction 03-14-15 showing ejection fraction 35-40%, right ventricular systolic pressure 65 mmHg  EKG on today's visit shows normal sinus rhythm with paced ventricular beats 82 bpm  Other past medical history Previous look at her ejection fraction over the past 8-10 years shows a history of depressed ejection fraction around 40% Echocardiogram done at [facility] in 2007-03-14 documented EF of 40% Echocardiogram March 13, 2010 done at [facility] documented EF of 40% Most recent echocardiogram in 03-14-2015 documents ejection fraction 35-40%. This echocardiogram did note moderate to severely elevated right ventricular systolic pressures estimated at 65 mmHg. Recent Myoview documenting a lower ejection fraction (19%) likely erroneous given echocardiogram result shortly following the perfusion scan Stress test showed no ischemia.   For the high right heart pressures on recent echocardiogram, she was started on Lasix 20 mg every other day. Currently only taking Lasix when  necessary  Cardiac catheterization in late 03-13-2013 showed moderate proximal and distal RCA disease estimated at 60%. Also mild to moderate proximal to mid LAD disease. Ejection fraction estimated at greater than 55%. There is no significant change in her RCA lesions compared to 03-13-08.  distal RCA lesion with moderate stenosis. The lesions were discussed with interventional cardiology and medical management was recommended .  Previous  Echo April 2011 showing mild LV systolic dysfunction, ejection fraction 43%, mildly dilated left atrium, mild pulmonary hypertension with right ventricular systolic pressure of 33  Stress test April 2011 showing normal study with normal ejection fraction.  This was a lexiscan.  cardiac catheterization in January 2008 showing 40% restenosis of mid RCA stent and 50% mid LAD disease. Notes indicate 2 stents in her RCA.  Labs from 12/28/2013 TSH 4.47, BNP 1200 Chest x-ray with bilateral chronic interstitial disease     Allergies  Allergen Reactions  . Ace Inhibitors Other (See Comments)    Reaction unknown  . Amlodipine-Atorvastatin Other (See Comments)    Reaction unknown  . Ciprofloxacin Other (See Comments)    Reaction unknown  . Quinolones Other (See Comments)    Reaction unknown    Outpatient Encounter Prescriptions as of 06/01/2015  Medication Sig  . albuterol (PROVENTIL HFA;VENTOLIN HFA) 108 (90 BASE) MCG/ACT inhaler Inhale 2 puffs into the lungs every 6 (six) hours as needed for wheezing or shortness of breath.  . Ascorbic Acid (VITAMIN C) 500 MG tablet Take 500 mg by mouth as needed.   Marland Kitchen aspirin 81 MG EC tablet Take 81 mg by mouth daily.    . carvedilol (COREG) 6.25 MG tablet TAKE 1 TABLET (6.25 MG TOTAL) BY MOUTH 2 (TWO) TIMES DAILY.  Marland Kitchen  furosemide (LASIX) 20 MG tablet Take 1 tablet (20 mg total) by mouth every other day. (Patient taking differently: Take 20 mg by mouth as needed. )  . levothyroxine (SYNTHROID, LEVOTHROID) 75 MCG tablet Take 75 mcg by  mouth daily.  Marland Kitchen LORazepam (ATIVAN) 1 MG tablet Take 1 mg by mouth at bedtime as needed for sleep.   Marland Kitchen losartan (COZAAR) 25 MG tablet Take 25 mg by mouth daily.   . Multiple Vitamins-Minerals (MULTIVITAMIN GUMMIES ADULT PO) Take by mouth daily.  Marland Kitchen omeprazole (PRILOSEC) 20 MG capsule Take 20 mg by mouth as needed.  . pravastatin (PRAVACHOL) 10 MG tablet Take 10 mg by mouth every other day.   . vitamin B-12 (CYANOCOBALAMIN) 1000 MCG tablet Take 1,000 mcg by mouth daily.     No facility-administered encounter medications on file as of 06/01/2015.    Past Medical History  Diagnosis Date  . Nonischemic cardiomyopathy     s/p Medtronic CRT-D, NYHA II-III at baseline  . Coronary artery disease     a. s/p prior stent-RCA b. borderline CAD on 04/2013 cath  . History of MI (myocardial infarction)   . Dyslipidemia   . Hypertension   . COPD (chronic obstructive pulmonary disease)   . Breast cancer   . Hypothyroidism   . Basal cell carcinoma 2011  . Respiratory infection   . Vitamin D deficiency     Past Surgical History  Procedure Laterality Date  . Cardiac defibrillator placement    . Insert / replace / remove pacemaker    . Mastectomy    . Abdominal hysterectomy    . Basal cell carcinoma excision      Monh's procedure  . Cardiac catheterization  2014  . Ep implantable device N/A 04/05/2015    Procedure: BIV ICD Generator Changeout;  Surgeon: Deboraha Sprang, MD;  Location: [facility];  Service: Cardiovascular;  Laterality: N/A;    Social History  reports that she has never smoked. She has never used smokeless tobacco. She reports that she does not drink alcohol or use illicit drugs.  Family History family history includes Cancer in her other; Coronary artery disease in her other; Hypertension in her father, mother, and other.  Review of Systems  Constitutional: Positive for fatigue.  Respiratory: Positive for shortness of breath.   Cardiovascular: Negative.    Gastrointestinal: Negative.   Musculoskeletal: Positive for gait problem.       Leg weakness  Neurological: Positive for weakness.  Hematological: Negative.   Psychiatric/Behavioral: Negative.   All other systems reviewed and are negative.   BP 148/84 mmHg  Pulse 82  Ht 5\' 1"  (1.549 m)  Wt 147 lb (66.679 kg)  BMI 27.79 kg/m2  Physical Exam  Constitutional: She is oriented to person, place, and time. She appears well-developed and well-nourished.  HENT:  Head: Normocephalic.  Nose: Nose normal.  Mouth/Throat: Oropharynx is clear and moist.  Eyes: Conjunctivae are normal. Pupils are equal, round, and reactive to light.  Neck: Normal range of motion. Neck supple. No JVD present.  Cardiovascular: Normal rate, regular rhythm, S1 normal, S2 normal, normal heart sounds and intact distal pulses.  Exam reveals no gallop and no friction rub.   No murmur heard. Pulmonary/Chest: Effort normal and breath sounds normal. No respiratory distress. She has no wheezes. She has no rales. She exhibits no tenderness.  Abdominal: Soft. Bowel sounds are normal. She exhibits no distension. There is no tenderness.  Musculoskeletal: Normal range of motion. She exhibits no  edema or tenderness.  Lymphadenopathy:    She has no cervical adenopathy.  Neurological: She is [name] and oriented to person, place, and time. Coordination normal.  Skin: Skin is warm and dry. No rash noted. No erythema.  Psychiatric: She has a normal mood and affect. Her behavior is normal. Judgment and thought content normal.    Assessment and Plan  Nursing note and vitals reviewed.

## 2015-06-01 NOTE — Assessment & Plan Note (Signed)
Blood pressure borderline elevated on initial check. Will not push aggressive blood pressure control given prior history of orthostasis

## 2015-06-01 NOTE — Assessment & Plan Note (Signed)
Appears relatively euvolemic, not taking Lasix apart from as needed. Consider starting her on entresto she is concerned about orthostatic hypotension which she's had in the past. We'll hold off for now as she is asymptomatic, reconsider on follow-up visit

## 2015-06-01 NOTE — Assessment & Plan Note (Addendum)
Chronic issue,  recommended a regular exercise program Discussed with daughter who presents today. Recommended the patient participated Silver sneakers or local recreational center at least 4 days per week

## 2015-06-01 NOTE — Patient Instructions (Signed)
You are doing well. No medication changes were made.  LOTS of exercise  Please call us if you have new issues that need to be addressed before your next appt.  Your physician wants you to follow-up in: 6 months.  You will receive a reminder letter in the mail two months in advance. If you don't receive a letter, please call our office to schedule the follow-up appointment.

## 2015-06-18 ENCOUNTER — Encounter: Payer: Self-pay | Admitting: Internal Medicine

## 2015-06-22 ENCOUNTER — Telehealth: Payer: Self-pay | Admitting: *Deleted

## 2015-06-22 NOTE — Telephone Encounter (Signed)
°  1. Which medications need to be refilled? Generic for Plavix   2. Which pharmacy is medication to be sent to? [facility] [address]   3. Do they need a 30 day or 90 day supply? 90   4. Would they like a call back once the medication has been sent to the pharmacy? No

## 2015-06-22 NOTE — Telephone Encounter (Signed)
Advised pharmacist that Plavix was d/c'd on 02/02/15 by Dr. Fletcher Anon due to excessive bruising.

## 2015-06-26 ENCOUNTER — Other Ambulatory Visit: Payer: Self-pay

## 2015-06-26 MED ORDER — CARVEDILOL 6.25 MG PO TABS: ORAL_TABLET | ORAL | Status: DC

## 2015-07-10 ENCOUNTER — Encounter: Payer: Self-pay | Admitting: Internal Medicine

## 2015-07-17 ENCOUNTER — Ambulatory Visit (INDEPENDENT_AMBULATORY_CARE_PROVIDER_SITE_OTHER): Payer: Commercial Managed Care - HMO | Admitting: *Deleted

## 2015-07-17 DIAGNOSIS — I5022 Chronic systolic (congestive) heart failure: Secondary | ICD-10-CM | POA: Diagnosis not present

## 2015-07-17 DIAGNOSIS — I428 Other cardiomyopathies: Secondary | ICD-10-CM

## 2015-07-17 DIAGNOSIS — I429 Cardiomyopathy, unspecified: Secondary | ICD-10-CM

## 2015-07-17 LAB — CUP PACEART REMOTE DEVICE CHECK
Battery Remaining Longevity: 118 mo
Battery Voltage: 3.07 V
Brady Statistic AP VP Percent: 0.08 %
Brady Statistic AS VP Percent: 98.61 %
Brady Statistic AS VS Percent: 1.27 %
Brady Statistic RV Percent Paced: 0.16 %
Date Time Interrogation Session: 20160830062607
HIGH POWER IMPEDANCE MEASURED VALUE: 37 Ohm
HighPow Impedance: 48 Ohm
Lead Channel Impedance Value: 513 Ohm
Lead Channel Impedance Value: 551 Ohm
Lead Channel Impedance Value: 589 Ohm
Lead Channel Impedance Value: 608 Ohm
Lead Channel Impedance Value: 931 Ohm
Lead Channel Pacing Threshold Amplitude: 0.5 V
Lead Channel Pacing Threshold Amplitude: 0.625 V
Lead Channel Pacing Threshold Amplitude: 1 V
Lead Channel Pacing Threshold Pulse Width: 0.4 ms
Lead Channel Pacing Threshold Pulse Width: 0.4 ms
Lead Channel Setting Pacing Amplitude: 1.5 V
Lead Channel Setting Pacing Amplitude: 2 V
Lead Channel Setting Pacing Amplitude: 2 V
Lead Channel Setting Pacing Pulse Width: 0.4 ms
Lead Channel Setting Pacing Pulse Width: 0.4 ms
Lead Channel Setting Sensing Sensitivity: 0.3 mV
MDC IDC MSMT LEADCHNL LV PACING THRESHOLD PULSEWIDTH: 0.4 ms
MDC IDC MSMT LEADCHNL RA IMPEDANCE VALUE: 361 Ohm
MDC IDC MSMT LEADCHNL RA SENSING INTR AMPL: 0.875 mV
MDC IDC MSMT LEADCHNL RA SENSING INTR AMPL: 0.875 mV
MDC IDC MSMT LEADCHNL RV SENSING INTR AMPL: 10.75 mV
MDC IDC MSMT LEADCHNL RV SENSING INTR AMPL: 10.75 mV
MDC IDC STAT BRADY AP VS PERCENT: 0.04 %
MDC IDC STAT BRADY RA PERCENT PACED: 0.12 %
Zone Setting Detection Interval: 300 ms
Zone Setting Detection Interval: 330 ms
Zone Setting Detection Interval: 350 ms
Zone Setting Detection Interval: 370 ms

## 2015-07-17 NOTE — Progress Notes (Signed)
Remote ICD transmission.   

## 2015-08-22 ENCOUNTER — Encounter: Payer: Self-pay | Admitting: Cardiology

## 2015-08-28 ENCOUNTER — Encounter: Payer: Self-pay | Admitting: Internal Medicine

## 2015-10-02 ENCOUNTER — Other Ambulatory Visit: Payer: Self-pay | Admitting: Internal Medicine

## 2015-10-02 DIAGNOSIS — Z1231 Encounter for screening mammogram for malignant neoplasm of breast: Secondary | ICD-10-CM

## 2015-10-09 ENCOUNTER — Ambulatory Visit
Admission: RE | Admit: 2015-10-09 | Discharge: 2015-10-09 | Disposition: A | Discharge status: Home / Self Care | Payer: Commercial Managed Care - HMO | Source: Ambulatory Visit | Attending: Internal Medicine | Admitting: Internal Medicine

## 2015-10-09 ENCOUNTER — Other Ambulatory Visit: Payer: Self-pay | Admitting: Internal Medicine

## 2015-10-09 DIAGNOSIS — Z1231 Encounter for screening mammogram for malignant neoplasm of breast: Secondary | ICD-10-CM

## 2015-10-10 ENCOUNTER — Inpatient Hospital Stay
Admission: RE | Admit: 2015-10-10 | Discharge: 2015-10-10 | Disposition: A | Discharge status: Home / Self Care | Payer: Commercial Managed Care - HMO | Source: Ambulatory Visit | Attending: Internal Medicine | Admitting: Internal Medicine

## 2015-10-10 DIAGNOSIS — Z1231 Encounter for screening mammogram for malignant neoplasm of breast: Secondary | ICD-10-CM

## 2015-10-18 ENCOUNTER — Ambulatory Visit (INDEPENDENT_AMBULATORY_CARE_PROVIDER_SITE_OTHER): Payer: Commercial Managed Care - HMO | Admitting: *Deleted

## 2015-10-18 DIAGNOSIS — I429 Cardiomyopathy, unspecified: Secondary | ICD-10-CM

## 2015-10-18 DIAGNOSIS — I5022 Chronic systolic (congestive) heart failure: Secondary | ICD-10-CM

## 2015-10-18 DIAGNOSIS — I428 Other cardiomyopathies: Secondary | ICD-10-CM

## 2015-10-19 NOTE — Progress Notes (Signed)
Remote ICD transmission.   

## 2015-10-31 LAB — CUP PACEART REMOTE DEVICE CHECK
Battery Remaining Longevity: 112 mo
Battery Voltage: 3.04 V
Brady Statistic AS VP Percent: 98.74 %
Brady Statistic AS VS Percent: 1.23 %
Brady Statistic RA Percent Paced: 0.03 %
Brady Statistic RV Percent Paced: 4.81 %
HighPow Impedance: 35 Ohm
HighPow Impedance: 44 Ohm
Implantable Lead Implant Date: 20100115
Implantable Lead Implant Date: 20100115
Implantable Lead Location: 753858
Implantable Lead Location: 753860
Implantable Lead Model: 4196
Implantable Lead Model: 5076
Lead Channel Impedance Value: 361 Ohm
Lead Channel Impedance Value: 722 Ohm
Lead Channel Pacing Threshold Amplitude: 0.5 V
Lead Channel Pacing Threshold Amplitude: 0.625 V
Lead Channel Pacing Threshold Pulse Width: 0.4 ms
Lead Channel Pacing Threshold Pulse Width: 0.4 ms
Lead Channel Sensing Intrinsic Amplitude: 0.25 mV
Lead Channel Sensing Intrinsic Amplitude: 10.75 mV
Lead Channel Setting Pacing Amplitude: 1.5 V
Lead Channel Setting Pacing Amplitude: 2 V
Lead Channel Setting Pacing Pulse Width: 0.4 ms
MDC IDC LEAD IMPLANT DT: 20100115
MDC IDC LEAD LOCATION: 753859
MDC IDC MSMT LEADCHNL LV IMPEDANCE VALUE: 399 Ohm
MDC IDC MSMT LEADCHNL LV IMPEDANCE VALUE: 475 Ohm
MDC IDC MSMT LEADCHNL LV PACING THRESHOLD AMPLITUDE: 0.875 V
MDC IDC MSMT LEADCHNL LV PACING THRESHOLD PULSEWIDTH: 0.4 ms
MDC IDC MSMT LEADCHNL RA SENSING INTR AMPL: 0.25 mV
MDC IDC MSMT LEADCHNL RV IMPEDANCE VALUE: 475 Ohm
MDC IDC MSMT LEADCHNL RV IMPEDANCE VALUE: 532 Ohm
MDC IDC MSMT LEADCHNL RV SENSING INTR AMPL: 10.75 mV
MDC IDC SESS DTM: 20161201093726
MDC IDC SET LEADCHNL RV SENSING SENSITIVITY: 0.3 mV
MDC IDC STAT BRADY AP VP PERCENT: 0.02 %
MDC IDC STAT BRADY AP VS PERCENT: 0.01 %

## 2015-11-02 ENCOUNTER — Encounter: Payer: Self-pay | Admitting: Cardiology

## 2015-11-22 ENCOUNTER — Ambulatory Visit: Payer: Commercial Managed Care - HMO | Admitting: Cardiovascular Disease

## 2016-01-01 ENCOUNTER — Ambulatory Visit: Payer: Commercial Managed Care - HMO | Admitting: Cardiovascular Disease

## 2016-01-08 ENCOUNTER — Ambulatory Visit (INDEPENDENT_AMBULATORY_CARE_PROVIDER_SITE_OTHER): Payer: PPO | Admitting: Cardiovascular Disease

## 2016-01-08 ENCOUNTER — Encounter: Payer: Self-pay | Admitting: Cardiovascular Disease

## 2016-01-08 VITALS — BP 136/80 | HR 88 | Ht 61.0 in | Wt 147.5 lb

## 2016-01-08 DIAGNOSIS — I429 Cardiomyopathy, unspecified: Secondary | ICD-10-CM

## 2016-01-08 DIAGNOSIS — Z95810 Presence of automatic (implantable) cardiac defibrillator: Secondary | ICD-10-CM

## 2016-01-08 DIAGNOSIS — R0602 Shortness of breath: Secondary | ICD-10-CM

## 2016-01-08 DIAGNOSIS — I10 Essential (primary) hypertension: Secondary | ICD-10-CM | POA: Diagnosis not present

## 2016-01-08 DIAGNOSIS — R2681 Unsteadiness on feet: Secondary | ICD-10-CM | POA: Insufficient documentation

## 2016-01-08 DIAGNOSIS — E782 Mixed hyperlipidemia: Secondary | ICD-10-CM

## 2016-01-08 DIAGNOSIS — I2510 Atherosclerotic heart disease of native coronary artery without angina pectoris: Secondary | ICD-10-CM | POA: Diagnosis not present

## 2016-01-08 DIAGNOSIS — I428 Other cardiomyopathies: Secondary | ICD-10-CM

## 2016-01-08 NOTE — Assessment & Plan Note (Signed)
Currently with no symptoms of angina. No further workup at this time. Continue current medication regimen. 

## 2016-01-08 NOTE — Assessment & Plan Note (Signed)
Encouraged her to stay on her pravastatin

## 2016-01-08 NOTE — Assessment & Plan Note (Signed)
Followed by Dr. Klein 

## 2016-01-08 NOTE — Assessment & Plan Note (Signed)
Long discussion today concerning her need to start a exercise program for gait stability Various exercise options discussed with her and her daughter   Total encounter time more than 25 minutes  Greater than 50% was spent in counseling and coordination of care with the patient

## 2016-01-08 NOTE — Progress Notes (Signed)
Patient ID: Erica [name], female    DOB: 1930/12/01, 82 y.o.   MRN: [id number]  HPI Comments: Mrs. 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 her husband passed away earlier in 02-29-12. She presents for her nonischemic cardiomyopathy Previous x-rays suggesting chronic interstitial disease  In follow-up today, she reports that she is weak, gait instability, high risk of falls, feels scared She is not doing any walking, no physical activity really outside her house Daughter has encouraged her to do more, she feels nervous  Also reports having "spells" at [facility] times of the day, more often when she stands up. Seems to happen more after she takes her medications Wonders if this could be from low blood pressure She does not check her blood pressure on a regular basis, very sporadically. Numbers provided shows systolic pressure [id number] to 130s, with standing on one occasion was [id number] systolic  Denies any significant leg edema, no chest pain on exertion  EKG on today's visit shows paced rhythm, rate 88 bpm  Other past medical history reviewed  Recent BIV ICD placement by Dr. Caryl Comes.  Prior ejection fraction March 2016 showing ejection fraction 35-40%, right ventricular systolic pressure 65 mmHg  Previous look at her ejection fraction over the past 8-10 years shows a history of depressed ejection fraction around 40% Echocardiogram done at [facility] in 03/01/07 documented EF of 40% Echocardiogram 28-Feb-2010 done at home [facility] documented EF of 40% Most recent echocardiogram in 2015/03/01 documents ejection fraction 35-40%. This echocardiogram did note moderate to severely elevated right ventricular systolic pressures estimated at 65 mmHg. Recent Myoview documenting a  lower ejection fraction (19%) likely erroneous given echocardiogram result shortly following the perfusion scan Stress test showed no ischemia.   For the high right heart pressures on recent echocardiogram, she was started on Lasix 20 mg every other day. Currently only taking Lasix when necessary  Cardiac catheterization in late February 28, 2013 showed moderate proximal and distal RCA disease estimated at 60%. Also mild to moderate proximal to mid LAD disease. Ejection fraction estimated at greater than 55%. There is no significant change in her RCA lesions compared to 2008/02/29.  distal RCA lesion with moderate stenosis. The lesions were discussed with interventional cardiology and medical management was recommended .  Previous  Echo 02-28-10 showing mild LV systolic dysfunction, ejection fraction 43%, mildly dilated left atrium, mild pulmonary hypertension with right ventricular systolic pressure of 33  Stress test 2010-02-28 showing normal study with normal ejection fraction.  This was a lexiscan.  cardiac catheterization in January 2008 showing 40% restenosis of mid RCA stent and 50% mid LAD disease. Notes indicate 2 stents in her RCA.  Labs from 12/28/2013 TSH 4.47, BNP 1200 Chest x-ray with bilateral chronic interstitial disease     Allergies  Allergen Reactions  . Ace Inhibitors Other (See Comments)    Reaction unknown  . Amlodipine-Atorvastatin Other (See Comments)    Reaction unknown  . Ciprofloxacin Other (See Comments)    Reaction unknown  . Quinolones Other (See Comments)    Reaction unknown    Outpatient Encounter Prescriptions as of 01/08/2016  Medication Sig  . albuterol (PROVENTIL HFA;VENTOLIN HFA) 108 (90 BASE) MCG/ACT inhaler Inhale 2 puffs into the lungs every 6 (six) hours as needed for wheezing or shortness  of breath.  . Ascorbic Acid (VITAMIN C) 500 MG tablet Take 500 mg by mouth as needed.   Marland Kitchen aspirin 81 MG EC tablet Take 81 mg by mouth daily.    . carvedilol (COREG) 6.25 MG  tablet TAKE 1 TABLET (6.25 MG TOTAL) BY MOUTH 2 (TWO) TIMES DAILY.  . furosemide (LASIX) 20 MG tablet Take 1 tablet (20 mg total) by mouth every other day. (Patient taking differently: Take 20 mg by mouth as needed. )  . levothyroxine (SYNTHROID, LEVOTHROID) 75 MCG tablet Take 75 mcg by mouth daily.  Marland Kitchen LORazepam (ATIVAN) 1 MG tablet Take 1 mg by mouth at bedtime as needed for sleep.   Marland Kitchen losartan (COZAAR) 25 MG tablet Take 25 mg by mouth daily.   . Multiple Vitamins-Minerals (MULTIVITAMIN GUMMIES ADULT PO) Take by mouth daily.  Marland Kitchen omeprazole (PRILOSEC) 20 MG capsule Take 20 mg by mouth as needed.  . pravastatin (PRAVACHOL) 10 MG tablet Take 10 mg by mouth every other day.   . vitamin B-12 (CYANOCOBALAMIN) 1000 MCG tablet Take 1,000 mcg by mouth daily.     No facility-administered encounter medications on file as of 01/08/2016.    Past Medical History  Diagnosis Date  . Nonischemic cardiomyopathy (HCC)     s/p Medtronic CRT-D, NYHA II-III at baseline  . Coronary artery disease     a. s/p prior stent-RCA b. borderline CAD on 04/2013 cath  . History of MI (myocardial infarction)   . Dyslipidemia   . Hypertension   . COPD (chronic obstructive pulmonary disease) ([place])   . Hypothyroidism   . Basal cell carcinoma 2011  . Respiratory infection   . Vitamin D deficiency   . Breast cancer ([place]) 30+ yrs. ago    right breast, radiation    Past Surgical History  Procedure Laterality Date  . Cardiac defibrillator placement    . Insert / replace / remove pacemaker    . Mastectomy    . Abdominal hysterectomy    . Basal cell carcinoma excision      Monh's procedure  . Cardiac catheterization  2014  . Ep implantable device N/A 04/05/2015    Procedure: BIV ICD Generator Changeout;  Surgeon: Deboraha Sprang, MD;  Location: [facility];  Service: Cardiovascular;  Laterality: N/A;  . Breast excisional biopsy Right 30+yrs ago    positive    Social History  reports that she has never smoked.  She has never used smokeless tobacco. She reports that she does not drink alcohol or use illicit drugs.  Family History family history includes Cancer in her other; Coronary artery disease in her other; Hypertension in her father, mother, and other.  Review of Systems  Constitutional: Positive for fatigue.  Respiratory: Negative.   Cardiovascular: Negative.   Gastrointestinal: Negative.   Musculoskeletal: Positive for gait problem.       Leg weakness  Neurological: Positive for weakness.  Hematological: Negative.   Psychiatric/Behavioral: Negative.   All other systems reviewed and are negative.   BP 136/80 mmHg  Pulse 88  Ht 5\' 1"  (1.549 m)  Wt 147 lb 8 oz (66.906 kg)  BMI 27.88 kg/m2  Physical Exam  Constitutional: She is oriented to person, place, and time. She appears well-developed and well-nourished.  HENT:  Head: Normocephalic.  Nose: Nose normal.  Mouth/Throat: Oropharynx is clear and moist.  Eyes: Conjunctivae are normal. Pupils are equal, round, and reactive to light.  Neck: Normal range of motion. Neck supple. No JVD present.  Cardiovascular: Normal rate, regular rhythm, S1 normal, S2 normal, normal heart sounds and intact distal pulses.  Exam reveals no gallop and no friction rub.   No murmur heard. Pulmonary/Chest: Effort normal and breath sounds normal. No respiratory distress. She has no wheezes. She has no rales. She exhibits no tenderness.  Abdominal: Soft. Bowel sounds are normal. She exhibits no distension. There is no tenderness.  Musculoskeletal: Normal range of motion. She exhibits no edema or tenderness.  Lymphadenopathy:    She has no cervical adenopathy.  Neurological: She is alert and oriented to person, place, and time. Coordination normal.  Skin: Skin is warm and dry. No rash noted. No erythema.  Psychiatric: She has a normal mood and affect. Her behavior is normal. Judgment and thought content normal.    Assessment and Plan  Nursing note and  vitals reviewed.

## 2016-01-08 NOTE — Assessment & Plan Note (Signed)
She takes her Lasix sporadically, not on a regular basis Reports having "spells" after she takes her medications. She is concerned about orthostasis. We have recommended she move her losartan to the evening, continue on her low-dose carvedilol If she continues to have episodes, potentially could decrease the carvedilol down to 3.125 mg twice a day Otherwise appears relatively euvolemic on today's visit

## 2016-01-08 NOTE — Patient Instructions (Signed)
You are doing well. No medication changes were made.  Please move the losartan to the evening Stay on the coreg/carvedilol  Please call us if you have new issues that need to be addressed before your next appt.  Your physician wants you to follow-up in: 6 months.  You will receive a reminder letter in the mail two months in advance. If you don't receive a letter, please call our office to schedule the follow-up appointment.

## 2016-01-08 NOTE — Assessment & Plan Note (Signed)
Blood pressure is well controlled on today's visit. No changes made to the medications. 

## 2016-01-16 ENCOUNTER — Telehealth: Payer: Self-pay | Admitting: Cardiovascular Disease

## 2016-01-16 NOTE — Telephone Encounter (Signed)
Please review and let me know what medication you wanted to add or switch her to.

## 2016-01-16 NOTE — Telephone Encounter (Signed)
Pt called stating we saw her last week and that Dr Rockey Situ suggested she change the time she took carvedilol to see if it helped with her BP  She tried that for a week and states it has not changed  He mentioned that if that did not work we could start her on a new lower dose medication (not sure what it was called)  But wants to try that  Please send it [facility] [address]  Please call back if we have any concerns

## 2016-01-17 ENCOUNTER — Ambulatory Visit (INDEPENDENT_AMBULATORY_CARE_PROVIDER_SITE_OTHER): Payer: PPO | Admitting: *Deleted

## 2016-01-17 DIAGNOSIS — I5022 Chronic systolic (congestive) heart failure: Secondary | ICD-10-CM | POA: Diagnosis not present

## 2016-01-17 DIAGNOSIS — I429 Cardiomyopathy, unspecified: Secondary | ICD-10-CM | POA: Diagnosis not present

## 2016-01-17 DIAGNOSIS — I428 Other cardiomyopathies: Secondary | ICD-10-CM

## 2016-01-17 NOTE — Progress Notes (Signed)
Remote ICD transmission.   

## 2016-01-18 NOTE — Telephone Encounter (Signed)
Left message for pt to call back  °

## 2016-01-18 NOTE — Telephone Encounter (Signed)
We had recommended she move losartan to the evening and continue carvedilol  If she continues to have symptoms, would decrease carvedilol down to 3.125 mill grams twice a day, losartan in the evening  Don't forget the exercise

## 2016-01-21 NOTE — Telephone Encounter (Signed)
Left message on pt's mobile # to call back.

## 2016-01-21 NOTE — Telephone Encounter (Signed)
Left message for pt to call back  °

## 2016-01-23 MED ORDER — CARVEDILOL 3.125 MG PO TABS: 3.1250 mg | ORAL_TABLET | Freq: Two times a day (BID) | ORAL | Status: DC

## 2016-01-23 NOTE — Telephone Encounter (Signed)
error 

## 2016-01-23 NOTE — Telephone Encounter (Signed)
Spoke w/ pt.  She states that she flew to [place] to see her grandchild, just now got my messages. Advised her of Dr. Donivan Scull recommendation.  She states that she has already been doing these things w/ no improvement in her sx, BP remains elevated. She does not report readings, only that it "continues to be a problem". Reviewed pt's meds w/ her, she reports that Dr. Ouida Sills decreased her carvedilol to 3.125 mg BID in October. She reports not other meds were changed by him.  Advised her that I am updating her chart and will make Dr. Rockey Situ aware and call her back if he has any further recommendations.

## 2016-01-24 NOTE — Telephone Encounter (Signed)
In the office recently she commented systolic blood pressure typically 120 up to 130, sometimes 106 when she stands up  We cut back on her Coreg to 3.125 mill grams twice a day, losartan in the evening Would recommend that she check her blood pressure when she "does not feel well" May be unrelated to her medications or her blood pressure Need some numbers

## 2016-01-24 NOTE — Telephone Encounter (Signed)
Spoke w/ [name].  Advised her of Dr. Donivan Scull recommendation. She verbalizes understanding and will really message to pt.  She will have her call back next week w/ readings.

## 2016-01-25 LAB — CUP PACEART REMOTE DEVICE CHECK
Battery Remaining Longevity: 111 mo
Battery Voltage: 3.03 V
Brady Statistic AP VS Percent: 0.01 %
Brady Statistic AS VP Percent: 98.79 %
Brady Statistic RA Percent Paced: 0.03 %
Date Time Interrogation Session: 20170302082208
HIGH POWER IMPEDANCE MEASURED VALUE: 44 Ohm
HighPow Impedance: 36 Ohm
Implantable Lead Implant Date: 20100115
Implantable Lead Location: 753858
Implantable Lead Model: 4196
Implantable Lead Model: 6947
Lead Channel Impedance Value: 361 Ohm
Lead Channel Impedance Value: 513 Ohm
Lead Channel Pacing Threshold Amplitude: 0.625 V
Lead Channel Pacing Threshold Amplitude: 1 V
Lead Channel Pacing Threshold Pulse Width: 0.4 ms
Lead Channel Pacing Threshold Pulse Width: 0.4 ms
Lead Channel Sensing Intrinsic Amplitude: 1.25 mV
Lead Channel Sensing Intrinsic Amplitude: 1.25 mV
Lead Channel Sensing Intrinsic Amplitude: 11 mV
Lead Channel Sensing Intrinsic Amplitude: 11 mV
Lead Channel Setting Pacing Amplitude: 2 V
Lead Channel Setting Pacing Pulse Width: 0.4 ms
Lead Channel Setting Sensing Sensitivity: 0.3 mV
MDC IDC LEAD IMPLANT DT: 20100115
MDC IDC LEAD IMPLANT DT: 20100115
MDC IDC LEAD LOCATION: 753859
MDC IDC LEAD LOCATION: 753860
MDC IDC MSMT LEADCHNL LV IMPEDANCE VALUE: 418 Ohm
MDC IDC MSMT LEADCHNL LV IMPEDANCE VALUE: 817 Ohm
MDC IDC MSMT LEADCHNL RA PACING THRESHOLD AMPLITUDE: 0.5 V
MDC IDC MSMT LEADCHNL RA PACING THRESHOLD PULSEWIDTH: 0.4 ms
MDC IDC MSMT LEADCHNL RV IMPEDANCE VALUE: 513 Ohm
MDC IDC MSMT LEADCHNL RV IMPEDANCE VALUE: 551 Ohm
MDC IDC SET LEADCHNL RA PACING AMPLITUDE: 1.5 V
MDC IDC STAT BRADY AP VP PERCENT: 0.02 %
MDC IDC STAT BRADY AS VS PERCENT: 1.18 %
MDC IDC STAT BRADY RV PERCENT PACED: 11.05 %

## 2016-01-25 NOTE — Progress Notes (Signed)
Normal remote reviewed.  Next follow up 03/2016 in clinic.  

## 2016-01-30 ENCOUNTER — Encounter: Payer: Self-pay | Admitting: Cardiology

## 2016-02-08 ENCOUNTER — Other Ambulatory Visit: Payer: Self-pay | Admitting: Cardiovascular Disease

## 2016-02-08 ENCOUNTER — Emergency Department
Admission: EM | Admit: 2016-02-08 | Discharge: 2016-02-08 | Disposition: A | Discharge status: Home / Self Care | Payer: PPO | Attending: Emergency Medicine | Admitting: Emergency Medicine

## 2016-02-08 ENCOUNTER — Emergency Department: Payer: PPO

## 2016-02-08 ENCOUNTER — Encounter: Payer: Self-pay | Admitting: *Deleted

## 2016-02-08 DIAGNOSIS — R42 Dizziness and giddiness: Secondary | ICD-10-CM | POA: Diagnosis not present

## 2016-02-08 DIAGNOSIS — Y998 Other external cause status: Secondary | ICD-10-CM | POA: Insufficient documentation

## 2016-02-08 DIAGNOSIS — Y9389 Activity, other specified: Secondary | ICD-10-CM | POA: Diagnosis not present

## 2016-02-08 DIAGNOSIS — Z7982 Long term (current) use of aspirin: Secondary | ICD-10-CM | POA: Diagnosis not present

## 2016-02-08 DIAGNOSIS — S0990XA Unspecified injury of head, initial encounter: Secondary | ICD-10-CM | POA: Diagnosis not present

## 2016-02-08 DIAGNOSIS — N390 Urinary tract infection, site not specified: Secondary | ICD-10-CM | POA: Diagnosis not present

## 2016-02-08 DIAGNOSIS — I10 Essential (primary) hypertension: Secondary | ICD-10-CM | POA: Insufficient documentation

## 2016-02-08 DIAGNOSIS — W01198A Fall on same level from slipping, tripping and stumbling with subsequent striking against other object, initial encounter: Secondary | ICD-10-CM | POA: Insufficient documentation

## 2016-02-08 DIAGNOSIS — Y9289 Other specified places as the place of occurrence of the external cause: Secondary | ICD-10-CM | POA: Insufficient documentation

## 2016-02-08 DIAGNOSIS — R55 Syncope and collapse: Secondary | ICD-10-CM | POA: Diagnosis not present

## 2016-02-08 LAB — CBC
HEMATOCRIT: 38.3 % (ref 35.0–47.0)
Hemoglobin: 12.9 g/dL (ref 12.0–16.0)
MCH: 30.3 pg (ref 26.0–34.0)
MCHC: 33.6 g/dL (ref 32.0–36.0)
MCV: 90 fL (ref 80.0–100.0)
PLATELETS: 191 10*3/uL (ref 150–440)
RBC: 4.25 MIL/uL (ref 3.80–5.20)
RDW: 14.1 % (ref 11.5–14.5)
WBC: 6.2 10*3/uL (ref 3.6–11.0)

## 2016-02-08 LAB — URINALYSIS COMPLETE WITH MICROSCOPIC (ARMC ONLY)
Bilirubin Urine: NEGATIVE
GLUCOSE, UA: NEGATIVE mg/dL
Hgb urine dipstick: NEGATIVE
Ketones, ur: NEGATIVE mg/dL
Nitrite: NEGATIVE
PROTEIN: NEGATIVE mg/dL
SPECIFIC GRAVITY, URINE: 1.014 (ref 1.005–1.030)
pH: 6 (ref 5.0–8.0)

## 2016-02-08 LAB — BASIC METABOLIC PANEL
Anion gap: 7 (ref 5–15)
BUN: 24 mg/dL — AB (ref 6–20)
CHLORIDE: 99 mmol/L — AB (ref 101–111)
CO2: 24 mmol/L (ref 22–32)
CREATININE: 1.11 mg/dL — AB (ref 0.44–1.00)
Calcium: 8.8 mg/dL — ABNORMAL LOW (ref 8.9–10.3)
GFR calc Af Amer: 52 mL/min — ABNORMAL LOW (ref >60)
GFR calc non Af Amer: 45 mL/min — ABNORMAL LOW (ref >60)
GLUCOSE: 106 mg/dL — AB (ref 65–99)
POTASSIUM: 4.7 mmol/L (ref 3.5–5.1)
SODIUM: 130 mmol/L — AB (ref 135–145)

## 2016-02-08 MED ORDER — SULFAMETHOXAZOLE-TRIMETHOPRIM 800-160 MG PO TABS
1.0000 | ORAL_TABLET | Freq: Two times a day (BID) | ORAL | Status: DC
Start: 2016-02-08 — End: 2016-03-12

## 2016-02-08 MED ORDER — SULFAMETHOXAZOLE-TRIMETHOPRIM 800-160 MG PO TABS
1.0000 | ORAL_TABLET | Freq: Once | ORAL | Status: AC
  Administered 2016-02-08: 1 via ORAL
  Filled 2016-02-08: qty 1

## 2016-02-08 NOTE — Discharge Instructions (Signed)
Please follow-up with your primary care physician as soon as possible regarding today's ER visit. Please take your antibiotics as prescribed. Return to the emergency department for any further concerning symptoms/events.   Near-Syncope Near-syncope (commonly known as near fainting) is sudden weakness, dizziness, or feeling like you might pass out. During an episode of near-syncope, you may also develop pale skin, have tunnel vision, or feel sick to your stomach (nauseous). Near-syncope may occur when getting up after sitting or while standing for a long time. It is caused by a sudden decrease in blood flow to the brain. This decrease can result from various causes or triggers, most of which are not serious. However, because near-syncope can sometimes be a sign of something serious, a medical evaluation is required. The specific cause is often not determined. HOME CARE INSTRUCTIONS  Monitor your condition for any changes. The following actions may help to alleviate any discomfort you are experiencing:  Have someone stay with you until you feel stable.  Lie down right away and prop your feet up if you start feeling like you might faint. Breathe deeply and steadily. Wait until all the symptoms have passed. Most of these episodes last only a few minutes. You may feel tired for several hours.   Drink enough fluids to keep your urine clear or pale yellow.   If you are taking blood pressure or heart medicine, get up slowly when seated or lying down. Take several minutes to sit and then stand. This can reduce dizziness.  Follow up with your health care provider as directed. SEEK IMMEDIATE MEDICAL CARE IF:   You have a severe headache.   You have unusual pain in the chest, abdomen, or back.   You are bleeding from the mouth or rectum, or you have black or tarry stool.   You have an irregular or very fast heartbeat.   You have repeated fainting or have seizure-like jerking during an episode.    You faint when sitting or lying down.   You have confusion.   You have difficulty walking.   You have severe weakness.   You have vision problems.  MAKE SURE YOU:   Understand these instructions.  Will watch your condition.  Will get help right away if you are not doing well or get worse.   This information is not intended to replace advice given to you by your health care provider. Make sure you discuss any questions you have with your health care provider.   Document Released: 11/03/2005 Document Revised: 11/08/2013 Document Reviewed: 04/08/2013 Elsevier Interactive Patient Education 2016 Elsevier Inc.  Urinary Tract Infection Urinary tract infections (UTIs) can develop anywhere along your urinary tract. Your urinary tract is your body's drainage system for removing wastes and extra water. Your urinary tract includes two kidneys, two ureters, a bladder, and a urethra. Your kidneys are a pair of bean-shaped organs. Each kidney is about the size of your fist. They are located below your ribs, one on each side of your spine. CAUSES Infections are caused by microbes, which are microscopic organisms, including fungi, viruses, and bacteria. These organisms are so small that they can only be seen through a microscope. Bacteria are the microbes that most commonly cause UTIs. SYMPTOMS  Symptoms of UTIs may vary by age and gender of the patient and by the location of the infection. Symptoms in young women typically include a frequent and intense urge to urinate and a painful, burning feeling in the bladder or urethra during urination.  Older women and men are more likely to be tired, shaky, and weak and have muscle aches and abdominal pain. A fever may mean the infection is in your kidneys. Other symptoms of a kidney infection include pain in your back or sides below the ribs, nausea, and vomiting. DIAGNOSIS To diagnose a UTI, your caregiver will ask you about your symptoms. Your  caregiver will also ask you to provide a urine sample. The urine sample will be tested for bacteria and white blood cells. White blood cells are made by your body to help fight infection. TREATMENT  Typically, UTIs can be treated with medication. Because most UTIs are caused by a bacterial infection, they usually can be treated with the use of antibiotics. The choice of antibiotic and length of treatment depend on your symptoms and the type of bacteria causing your infection. HOME CARE INSTRUCTIONS  If you were prescribed antibiotics, take them exactly as your caregiver instructs you. Finish the medication even if you feel better after you have only taken some of the medication.  Drink enough water and fluids to keep your urine clear or pale yellow.  Avoid caffeine, tea, and carbonated beverages. They tend to irritate your bladder.  Empty your bladder often. Avoid holding urine for long periods of time.  Empty your bladder before and after sexual intercourse.  After a bowel movement, women should cleanse from front to back. Use each tissue only once. SEEK MEDICAL CARE IF:   You have back pain.  You develop a fever.  Your symptoms do not begin to resolve within 3 days. SEEK IMMEDIATE MEDICAL CARE IF:   You have severe back pain or lower abdominal pain.  You develop chills.  You have nausea or vomiting.  You have continued burning or discomfort with urination. MAKE SURE YOU:   Understand these instructions.  Will watch your condition.  Will get help right away if you are not doing well or get worse.   This information is not intended to replace advice given to you by your health care provider. Make sure you discuss any questions you have with your health care provider.   Document Released: 08/13/2005 Document Revised: 07/25/2015 Document Reviewed: 12/12/2011 Elsevier Interactive Patient Education [employer].

## 2016-02-08 NOTE — ED Provider Notes (Signed)
[facility] Emergency Department Provider Note  Time seen: 1:54 PM  I have reviewed the triage vital signs and the nursing notes.   HISTORY  Chief Complaint Fall; Head Injury; and Dizziness    HPI Erica [name] is a 82 y.o. female with a past medical history of MI, hyperlipidemia, hypertension, COPD, cardiomyopathy, presents the emergency department after a fall. According to the patient she gets dizzy episodes in which she has to sit down several times a day for the past several years. She has been seen by her cardiologist for the same name told her that there is nothing else they can do for this.Today the patient said she got up and started walking toward her living room to do some cleaning when she acutely became dizzy like she needed to sit down however she was unable to get to a chair and fell hitting her head. Denies LOC. Denies vomiting but states she did feel nauseated after the fall. Denies any chest pain. States mild headache but denies any other discomfort currently.     Past Medical History  Diagnosis Date  . Nonischemic cardiomyopathy (HCC)     s/p Medtronic CRT-D, NYHA II-III at baseline  . Coronary artery disease     a. s/p prior stent-RCA b. borderline CAD on 04/2013 cath  . History of MI (myocardial infarction)   . Dyslipidemia   . Hypertension   . COPD (chronic obstructive pulmonary disease) ([place])   . Hypothyroidism   . Basal cell carcinoma 2011  . Respiratory infection   . Vitamin D deficiency   . Breast cancer ([place]) 30+ yrs. ago    right breast, radiation    Patient Active Problem List   Diagnosis Date Noted  . Gait instability 01/08/2016  . Chronic systolic heart failure ([place]) 02/02/2015  . Cough 01/18/2014  . Weakness generalized 01/18/2014  . Acute bronchitis 12/28/2013  . Nonischemic cardiomyopathy ([place]) 12/28/2013  . Orthostatic hypotension 04/06/2012  . Biventricular implantable cardioverter-defibrillator in situ  04/04/2011  . HYPERLIPIDEMIA, MIXED 10/23/2009  . Essential hypertension 10/23/2009  . Coronary atherosclerosis 10/23/2009    Past Surgical History  Procedure Laterality Date  . Cardiac defibrillator placement    . Insert / replace / remove pacemaker    . Mastectomy    . Abdominal hysterectomy    . Basal cell carcinoma excision      Monh's procedure  . Cardiac catheterization  2014  . Ep implantable device N/A 04/05/2015    Procedure: BIV ICD Generator Changeout;  Surgeon: Deboraha Sprang, MD;  Location: [place] [facility];  Service: Cardiovascular;  Laterality: N/A;  . Breast excisional biopsy Right 30+yrs ago    positive    Current Outpatient Rx  Name  Route  Sig  Dispense  Refill  . albuterol (PROVENTIL HFA;VENTOLIN HFA) 108 (90 BASE) MCG/ACT inhaler   Inhalation   Inhale 2 puffs into the lungs every 6 (six) hours as needed for wheezing or shortness of breath.   1 Inhaler   0   . Ascorbic Acid (VITAMIN C) 500 MG tablet   Oral   Take 500 mg by mouth as needed.          Marland Kitchen aspirin 81 MG EC tablet   Oral   Take 81 mg by mouth daily.           . carvedilol (COREG) 3.125 MG tablet   Oral   Take 1 tablet (3.125 mg total) by mouth 2 (two) times daily.  180 tablet   3   . furosemide (LASIX) 20 MG tablet   Oral   Take 1 tablet (20 mg total) by mouth every other day. Patient taking differently: Take 20 mg by mouth as needed.    45 tablet   4   . levothyroxine (SYNTHROID, LEVOTHROID) 75 MCG tablet   Oral   Take 75 mcg by mouth daily.         Marland Kitchen LORazepam (ATIVAN) 1 MG tablet   Oral   Take 1 mg by mouth at bedtime as needed for sleep.          Marland Kitchen losartan (COZAAR) 25 MG tablet   Oral   Take 25 mg by mouth daily.          Marland Kitchen losartan (COZAAR) 25 MG tablet      TAKE 1 TABLET (25 MG TOTAL) BY MOUTH DAILY.   30 tablet   11   . Multiple Vitamins-Minerals (MULTIVITAMIN GUMMIES ADULT PO)   Oral   Take by mouth daily.         Marland Kitchen omeprazole (PRILOSEC) 20 MG  capsule   Oral   Take 20 mg by mouth as needed.         . pravastatin (PRAVACHOL) 10 MG tablet   Oral   Take 10 mg by mouth every other day.       0   . vitamin B-12 (CYANOCOBALAMIN) 1000 MCG tablet   Oral   Take 1,000 mcg by mouth daily.             Allergies Ace inhibitors; Amlodipine-atorvastatin; Ciprofloxacin; and Quinolones  Family History  Problem Relation Age of Onset  . Cancer Other   . Coronary artery disease Other   . Hypertension Other   . Hypertension Mother   . Hypertension Father     Social History Social History  Substance Use Topics  . Smoking status: Never Smoker   . Smokeless tobacco: Never Used  . Alcohol Use: No    Review of Systems Constitutional: Negative for fever.  Intermittent dizziness. Cardiovascular: Negative for chest pain. Respiratory: Negative for shortness of breath. Gastrointestinal: Negative for abdominal pain Musculoskeletal: Negative for back pain. Neurological: Negative for headache 10-point ROS otherwise negative.  ____________________________________________   PHYSICAL EXAM:  VITAL SIGNS: ED Triage Vitals  Enc Vitals Group     BP 02/08/16 1222 148/66 mmHg     Pulse Rate 02/08/16 1222 84     Resp 02/08/16 1222 16     Temp 02/08/16 1222 97.4 F (36.3 C)     Temp Source 02/08/16 1222 Oral     SpO2 02/08/16 1222 95 %     Weight 02/08/16 1222 147 lb (66.679 kg)     Height 02/08/16 1222 5\' 1"  (1.549 m)     Head Cir --      Peak Flow --      Pain Score --      Pain Loc --      Pain Edu? --      Excl. in [place]? --     Constitutional: Alert and oriented. Well appearing and in no distress. Eyes: Normal exam ENT   Head: Normocephalic, Small abrasion to nose where her eyeglasses overlie.   Mouth/Throat: Mucous membranes are moist. Cardiovascular: Normal rate, regular rhythm. No murmur Respiratory: Normal respiratory effort without tachypnea nor retractions. Breath sounds are clear Gastrointestinal: Soft  and nontender. No distention.   Musculoskeletal: Nontender with normal range of motion in all extremities.  Neurologic:  Normal speech and language. No gross focal neurologic deficits Skin:  Skin is warm, dry and intact.  Psychiatric: Mood and affect are normal. Speech and behavior are normal.  ____________________________________________    EKG  EKG reviewed and interpreted by myself shows an atrial sensed ventricular paced rhythm at 85 bpm, widened QRS, nonspecific but no concerning ST changes.  ____________________________________________    RADIOLOGY  CT shows no acute abnormality   INITIAL IMPRESSION / ASSESSMENT AND PLAN / ED COURSE  Pertinent labs & imaging results that were available during my care of the patient were reviewed by me and considered in my medical decision making (see chart for details).  Patient presents after an episode most consistent with orthostatic hypotension. Patient states this happens several times a day for the last several years, has been seen by her cardiologist for the same they've not been able to find the cause. Patient has a pacemaker. Medtronic interpretation was performed, with no device issues identified. Patient's labs are consistent with a urinary tract infection. A urine culture has been sent. CT head is negative for intracranial abnormality. I discussed with the patient increasing fluids, we will treat the patient's UTI with 3 days of twice a day Bactrim. She will follow up with her primary care physician.  ____________________________________________   FINAL CLINICAL IMPRESSION(S) / ED DIAGNOSES  Fall Near-syncope UTI   Harvest Dark, MD 02/08/16 [phone]

## 2016-02-08 NOTE — ED Notes (Signed)
Pt to ED via POV after experiencing fall this morning around 1030 after becoming dizzy and light headed. Pt states hit head, abrasion noted to bridge of nose. Pt denies taking blood thinners. Pt with loss of LOC. Pt states current blurry vision bilaterally. Vitals stable, pt talking complete full sentences. Skin color appropriate and warm.

## 2016-02-08 NOTE — ED Notes (Addendum)
Patient arrives to room7: Physical exam is unremarkable with the exception of a small laceration to the bridge of the nose. However, patient c/o: intermittent headache with blurred vision that resulted in the fall without LOC. Patient states she is also having diffuse cramps, generalized weakness, and groin pain. Headache and blurred vision has been intermittent since last week. None of these symptoms are present at this time. + Nausea -Vomiting, patient on ASA  Hx of orthostatic hypotension, retinal nerve occlusion

## 2016-02-12 DIAGNOSIS — N390 Urinary tract infection, site not specified: Secondary | ICD-10-CM | POA: Diagnosis not present

## 2016-02-12 DIAGNOSIS — R296 Repeated falls: Secondary | ICD-10-CM | POA: Diagnosis not present

## 2016-02-16 DIAGNOSIS — G459 Transient cerebral ischemic attack, unspecified: Secondary | ICD-10-CM

## 2016-02-16 HISTORY — DX: Transient cerebral ischemic attack, unspecified: G45.9

## 2016-02-19 ENCOUNTER — Telehealth: Payer: Self-pay | Admitting: Physician Assistant

## 2016-02-19 DIAGNOSIS — I2510 Atherosclerotic heart disease of native coronary artery without angina pectoris: Secondary | ICD-10-CM | POA: Diagnosis not present

## 2016-02-19 DIAGNOSIS — I252 Old myocardial infarction: Secondary | ICD-10-CM | POA: Diagnosis not present

## 2016-02-19 DIAGNOSIS — I110 Hypertensive heart disease with heart failure: Secondary | ICD-10-CM | POA: Diagnosis not present

## 2016-02-19 DIAGNOSIS — I429 Cardiomyopathy, unspecified: Secondary | ICD-10-CM | POA: Diagnosis not present

## 2016-02-19 DIAGNOSIS — I509 Heart failure, unspecified: Secondary | ICD-10-CM | POA: Diagnosis not present

## 2016-02-19 DIAGNOSIS — R296 Repeated falls: Secondary | ICD-10-CM | POA: Diagnosis not present

## 2016-02-19 DIAGNOSIS — Z950 Presence of cardiac pacemaker: Secondary | ICD-10-CM | POA: Diagnosis not present

## 2016-02-19 DIAGNOSIS — Z8673 Personal history of transient ischemic attack (TIA), and cerebral infarction without residual deficits: Secondary | ICD-10-CM | POA: Diagnosis not present

## 2016-02-19 DIAGNOSIS — Z87891 Personal history of nicotine dependence: Secondary | ICD-10-CM | POA: Diagnosis not present

## 2016-02-19 DIAGNOSIS — Z95810 Presence of automatic (implantable) cardiac defibrillator: Secondary | ICD-10-CM | POA: Diagnosis not present

## 2016-02-19 NOTE — Telephone Encounter (Signed)
HHRN seeing pt and she is still complaining of dizziness after she walks for a short distance. Her heart rate is increasing, but SBP not dropping below 110.    Pt has been holding her losartan, symptoms are a little better. She has not taken prn Lasix in several days.   Requested [facility] check her BP/HR when pt is dizzy, not just when she first stands up.   OK to continue to hold losartan and Lasix.  Will send msg to [place] office to call her.   Lenoard Aden 02/19/2016 6:56 PM Beeper [phone]

## 2016-02-20 NOTE — Telephone Encounter (Signed)
[facility] home health nurse states that patient has had previous falls and she has been anxious. Patients family is taking her to see a neurologist for further evaluation and physical therapy is coming today to work with patient. Instructed them to continued monitoring her blood pressures at home and to call if they continue to have any problems or have further questions. Christina verbalized understanding of all instructions and had no further questions at this time.

## 2016-02-20 NOTE — Telephone Encounter (Signed)
Dizziness issues have been a chronic problem often seemed less related to her blood pressure, Possibly related to severe deconditioning, anxiety when standing She feels like she is going to fall, secondary to leg weakness Would certainly recommend blood pressure checks at home

## 2016-02-20 NOTE — Telephone Encounter (Signed)
Left voicemail message for her to call back.  

## 2016-02-22 DIAGNOSIS — N390 Urinary tract infection, site not specified: Secondary | ICD-10-CM | POA: Diagnosis not present

## 2016-03-07 DIAGNOSIS — I2510 Atherosclerotic heart disease of native coronary artery without angina pectoris: Secondary | ICD-10-CM | POA: Diagnosis not present

## 2016-03-07 DIAGNOSIS — R296 Repeated falls: Secondary | ICD-10-CM | POA: Diagnosis not present

## 2016-03-07 DIAGNOSIS — I110 Hypertensive heart disease with heart failure: Secondary | ICD-10-CM | POA: Diagnosis not present

## 2016-03-07 DIAGNOSIS — I509 Heart failure, unspecified: Secondary | ICD-10-CM | POA: Diagnosis not present

## 2016-03-11 ENCOUNTER — Other Ambulatory Visit: Payer: Self-pay | Admitting: *Deleted

## 2016-03-11 DIAGNOSIS — N390 Urinary tract infection, site not specified: Secondary | ICD-10-CM | POA: Diagnosis not present

## 2016-03-11 DIAGNOSIS — R531 Weakness: Secondary | ICD-10-CM | POA: Diagnosis not present

## 2016-03-11 MED ORDER — FUROSEMIDE 20 MG PO TABS: 20.0000 mg | ORAL_TABLET | ORAL | Status: DC

## 2016-03-11 NOTE — Telephone Encounter (Signed)
Requested Prescriptions   Signed Prescriptions Disp Refills  . furosemide (LASIX) 20 MG tablet 45 tablet 3    Sig: Take 1 tablet (20 mg total) by mouth every other day.    Authorizing Provider: Minna Merritts    Ordering User: Britt Bottom

## 2016-03-12 ENCOUNTER — Inpatient Hospital Stay
Admit: 2016-03-12 | Discharge: 2016-03-12 | Disposition: A | Discharge status: Home / Self Care | Payer: PPO | Attending: Internal Medicine | Admitting: Internal Medicine

## 2016-03-12 ENCOUNTER — Inpatient Hospital Stay
Admission: EM | Admit: 2016-03-12 | Discharge: 2016-03-14 | DRG: 069 | Disposition: A | Discharge status: Skilled Nursing Facility | Payer: PPO | Attending: Internal Medicine | Admitting: Internal Medicine

## 2016-03-12 ENCOUNTER — Emergency Department: Payer: PPO

## 2016-03-12 ENCOUNTER — Inpatient Hospital Stay: Payer: PPO

## 2016-03-12 ENCOUNTER — Encounter: Payer: Self-pay | Admitting: Emergency Medicine

## 2016-03-12 DIAGNOSIS — G459 Transient cerebral ischemic attack, unspecified: Secondary | ICD-10-CM | POA: Insufficient documentation

## 2016-03-12 DIAGNOSIS — I429 Cardiomyopathy, unspecified: Secondary | ICD-10-CM | POA: Diagnosis not present

## 2016-03-12 DIAGNOSIS — Z95810 Presence of automatic (implantable) cardiac defibrillator: Secondary | ICD-10-CM

## 2016-03-12 DIAGNOSIS — I2510 Atherosclerotic heart disease of native coronary artery without angina pectoris: Secondary | ICD-10-CM | POA: Diagnosis present

## 2016-03-12 DIAGNOSIS — E871 Hypo-osmolality and hyponatremia: Secondary | ICD-10-CM | POA: Diagnosis present

## 2016-03-12 DIAGNOSIS — I639 Cerebral infarction, unspecified: Secondary | ICD-10-CM | POA: Diagnosis not present

## 2016-03-12 DIAGNOSIS — Z823 Family history of stroke: Secondary | ICD-10-CM | POA: Diagnosis not present

## 2016-03-12 DIAGNOSIS — E039 Hypothyroidism, unspecified: Secondary | ICD-10-CM | POA: Diagnosis present

## 2016-03-12 DIAGNOSIS — Z79899 Other long term (current) drug therapy: Secondary | ICD-10-CM | POA: Diagnosis not present

## 2016-03-12 DIAGNOSIS — I5022 Chronic systolic (congestive) heart failure: Secondary | ICD-10-CM | POA: Diagnosis not present

## 2016-03-12 DIAGNOSIS — R748 Abnormal levels of other serum enzymes: Secondary | ICD-10-CM | POA: Diagnosis not present

## 2016-03-12 DIAGNOSIS — I252 Old myocardial infarction: Secondary | ICD-10-CM | POA: Diagnosis not present

## 2016-03-12 DIAGNOSIS — Z853 Personal history of malignant neoplasm of breast: Secondary | ICD-10-CM

## 2016-03-12 DIAGNOSIS — R4781 Slurred speech: Secondary | ICD-10-CM | POA: Diagnosis not present

## 2016-03-12 DIAGNOSIS — R778 Other specified abnormalities of plasma proteins: Secondary | ICD-10-CM | POA: Insufficient documentation

## 2016-03-12 DIAGNOSIS — Z955 Presence of coronary angioplasty implant and graft: Secondary | ICD-10-CM

## 2016-03-12 DIAGNOSIS — Z87891 Personal history of nicotine dependence: Secondary | ICD-10-CM

## 2016-03-12 DIAGNOSIS — Z9889 Other specified postprocedural states: Secondary | ICD-10-CM | POA: Diagnosis not present

## 2016-03-12 DIAGNOSIS — R7989 Other specified abnormal findings of blood chemistry: Secondary | ICD-10-CM | POA: Diagnosis not present

## 2016-03-12 DIAGNOSIS — Z888 Allergy status to other drugs, medicaments and biological substances status: Secondary | ICD-10-CM | POA: Diagnosis not present

## 2016-03-12 DIAGNOSIS — R29898 Other symptoms and signs involving the musculoskeletal system: Secondary | ICD-10-CM | POA: Insufficient documentation

## 2016-03-12 DIAGNOSIS — I110 Hypertensive heart disease with heart failure: Secondary | ICD-10-CM | POA: Diagnosis not present

## 2016-03-12 DIAGNOSIS — R296 Repeated falls: Secondary | ICD-10-CM | POA: Insufficient documentation

## 2016-03-12 DIAGNOSIS — Z90710 Acquired absence of both cervix and uterus: Secondary | ICD-10-CM

## 2016-03-12 DIAGNOSIS — I951 Orthostatic hypotension: Secondary | ICD-10-CM | POA: Diagnosis not present

## 2016-03-12 DIAGNOSIS — Z880 Allergy status to penicillin: Secondary | ICD-10-CM | POA: Diagnosis not present

## 2016-03-12 DIAGNOSIS — H5442 Blindness, left eye, normal vision right eye: Secondary | ICD-10-CM | POA: Diagnosis present

## 2016-03-12 DIAGNOSIS — J449 Chronic obstructive pulmonary disease, unspecified: Secondary | ICD-10-CM | POA: Diagnosis not present

## 2016-03-12 DIAGNOSIS — Z9010 Acquired absence of unspecified breast and nipple: Secondary | ICD-10-CM

## 2016-03-12 DIAGNOSIS — Z85828 Personal history of other malignant neoplasm of skin: Secondary | ICD-10-CM | POA: Diagnosis not present

## 2016-03-12 DIAGNOSIS — E785 Hyperlipidemia, unspecified: Secondary | ICD-10-CM | POA: Diagnosis present

## 2016-03-12 DIAGNOSIS — I272 Other secondary pulmonary hypertension: Secondary | ICD-10-CM | POA: Diagnosis present

## 2016-03-12 DIAGNOSIS — R0602 Shortness of breath: Secondary | ICD-10-CM | POA: Diagnosis not present

## 2016-03-12 DIAGNOSIS — J849 Interstitial pulmonary disease, unspecified: Secondary | ICD-10-CM | POA: Diagnosis present

## 2016-03-12 DIAGNOSIS — I10 Essential (primary) hypertension: Secondary | ICD-10-CM | POA: Diagnosis not present

## 2016-03-12 DIAGNOSIS — R05 Cough: Secondary | ICD-10-CM | POA: Diagnosis not present

## 2016-03-12 DIAGNOSIS — W19XXXA Unspecified fall, initial encounter: Secondary | ICD-10-CM | POA: Insufficient documentation

## 2016-03-12 DIAGNOSIS — Z8249 Family history of ischemic heart disease and other diseases of the circulatory system: Secondary | ICD-10-CM | POA: Diagnosis not present

## 2016-03-12 DIAGNOSIS — R209 Unspecified disturbances of skin sensation: Secondary | ICD-10-CM | POA: Diagnosis not present

## 2016-03-12 DIAGNOSIS — I248 Other forms of acute ischemic heart disease: Secondary | ICD-10-CM | POA: Diagnosis present

## 2016-03-12 DIAGNOSIS — R059 Cough, unspecified: Secondary | ICD-10-CM

## 2016-03-12 LAB — BASIC METABOLIC PANEL
Anion gap: 9 (ref 5–15)
BUN: 14 mg/dL (ref 6–20)
CALCIUM: 8.8 mg/dL — AB (ref 8.9–10.3)
CO2: 21 mmol/L — ABNORMAL LOW (ref 22–32)
CREATININE: 1.19 mg/dL — AB (ref 0.44–1.00)
Chloride: 102 mmol/L (ref 101–111)
GFR calc non Af Amer: 41 mL/min — ABNORMAL LOW (ref >60)
GFR, EST AFRICAN AMERICAN: 48 mL/min — AB (ref >60)
Glucose, Bld: 122 mg/dL — ABNORMAL HIGH (ref 65–99)
Potassium: 5 mmol/L (ref 3.5–5.1)
SODIUM: 132 mmol/L — AB (ref 135–145)

## 2016-03-12 LAB — SEDIMENTATION RATE: SED RATE: 77 mm/h — AB (ref 0–30)

## 2016-03-12 LAB — CBC
HEMATOCRIT: 36.1 % (ref 35.0–47.0)
HEMOGLOBIN: 12.4 g/dL (ref 12.0–16.0)
MCH: 30.5 pg (ref 26.0–34.0)
MCHC: 34.2 g/dL (ref 32.0–36.0)
MCV: 88.9 fL (ref 80.0–100.0)
Platelets: 202 10*3/uL (ref 150–440)
RBC: 4.06 MIL/uL (ref 3.80–5.20)
RDW: 14 % (ref 11.5–14.5)
WBC: 7.6 10*3/uL (ref 3.6–11.0)

## 2016-03-12 LAB — URINALYSIS COMPLETE WITH MICROSCOPIC (ARMC ONLY)
BACTERIA UA: NONE SEEN
BILIRUBIN URINE: NEGATIVE
GLUCOSE, UA: NEGATIVE mg/dL
HGB URINE DIPSTICK: NEGATIVE
Ketones, ur: NEGATIVE mg/dL
Leukocytes, UA: NEGATIVE
NITRITE: NEGATIVE
Protein, ur: NEGATIVE mg/dL
SPECIFIC GRAVITY, URINE: 1.004 — AB (ref 1.005–1.030)
pH: 6 (ref 5.0–8.0)

## 2016-03-12 LAB — TROPONIN I
Troponin I: 0.05 ng/mL — ABNORMAL HIGH (ref <0.031)
Troponin I: 0.06 ng/mL — ABNORMAL HIGH (ref <0.031)
Troponin I: 0.13 ng/mL — ABNORMAL HIGH (ref <0.031)

## 2016-03-12 MED ORDER — ADULT MULTIVITAMIN W/MINERALS CH
1.0000 | ORAL_TABLET | Freq: Every day | ORAL | Status: DC
  Administered 2016-03-13 – 2016-03-14 (×2): 1 via ORAL
  Filled 2016-03-12 (×2): qty 1

## 2016-03-12 MED ORDER — ACETAMINOPHEN 325 MG PO TABS: 650.0000 mg | ORAL_TABLET | Freq: Four times a day (QID) | ORAL | Status: DC | PRN

## 2016-03-12 MED ORDER — SODIUM CHLORIDE 0.9 % IV BOLUS (SEPSIS)
250.0000 mL | Freq: Once | INTRAVENOUS | Status: AC
  Administered 2016-03-12: 19:00:00 250 mL via INTRAVENOUS

## 2016-03-12 MED ORDER — CARVEDILOL 6.25 MG PO TABS
3.1250 mg | ORAL_TABLET | Freq: Two times a day (BID) | ORAL | Status: DC
  Administered 2016-03-12 – 2016-03-14 (×4): 3.125 mg via ORAL
  Filled 2016-03-12 (×4): qty 1

## 2016-03-12 MED ORDER — LEVOTHYROXINE SODIUM 75 MCG PO TABS
75.0000 ug | ORAL_TABLET | Freq: Every day | ORAL | Status: DC
  Administered 2016-03-13 – 2016-03-14 (×2): 75 ug via ORAL
  Filled 2016-03-12 (×2): qty 1

## 2016-03-12 MED ORDER — IPRATROPIUM-ALBUTEROL 0.5-2.5 (3) MG/3ML IN SOLN
3.0000 mL | Freq: Four times a day (QID) | RESPIRATORY_TRACT | Status: DC
  Administered 2016-03-13 (×2): 3 mL via RESPIRATORY_TRACT
  Filled 2016-03-12 (×3): qty 3

## 2016-03-12 MED ORDER — ENOXAPARIN SODIUM 40 MG/0.4ML ~~LOC~~ SOLN
40.0000 mg | SUBCUTANEOUS | Status: DC
  Administered 2016-03-12: 21:00:00 40 mg via SUBCUTANEOUS
  Filled 2016-03-12: qty 0.4

## 2016-03-12 MED ORDER — LORAZEPAM 1 MG PO TABS
1.0000 mg | ORAL_TABLET | Freq: Every evening | ORAL | Status: DC | PRN
  Administered 2016-03-12 – 2016-03-13 (×2): 1 mg via ORAL
  Filled 2016-03-12 (×2): qty 1

## 2016-03-12 MED ORDER — ASPIRIN 81 MG PO CHEW
324.0000 mg | CHEWABLE_TABLET | Freq: Once | ORAL | Status: AC
  Administered 2016-03-12: 324 mg via ORAL
  Filled 2016-03-12: qty 4

## 2016-03-12 MED ORDER — FUROSEMIDE 20 MG PO TABS: 20.0000 mg | ORAL_TABLET | Freq: Every day | ORAL | Status: DC | PRN

## 2016-03-12 MED ORDER — VITAMIN B-12 1000 MCG PO TABS
1000.0000 ug | ORAL_TABLET | Freq: Every day | ORAL | Status: DC
  Administered 2016-03-13 – 2016-03-14 (×2): 1000 ug via ORAL
  Filled 2016-03-12 (×2): qty 1

## 2016-03-12 MED ORDER — VITAMIN C 500 MG PO TABS
500.0000 mg | ORAL_TABLET | Freq: Every day | ORAL | Status: DC
  Administered 2016-03-13 – 2016-03-14 (×2): 500 mg via ORAL
  Filled 2016-03-12 (×2): qty 1

## 2016-03-12 MED ORDER — CLOPIDOGREL BISULFATE 75 MG PO TABS
75.0000 mg | ORAL_TABLET | Freq: Every day | ORAL | Status: DC
  Administered 2016-03-12 – 2016-03-14 (×3): 75 mg via ORAL
  Filled 2016-03-12 (×3): qty 1

## 2016-03-12 MED ORDER — ALBUTEROL SULFATE HFA 108 (90 BASE) MCG/ACT IN AERS: 2.0000 | INHALATION_SPRAY | Freq: Four times a day (QID) | RESPIRATORY_TRACT | Status: DC | PRN

## 2016-03-12 MED ORDER — ACETAMINOPHEN 650 MG RE SUPP: 650.0000 mg | Freq: Four times a day (QID) | RECTAL | Status: DC | PRN

## 2016-03-12 MED ORDER — PRAVASTATIN SODIUM 20 MG PO TABS
10.0000 mg | ORAL_TABLET | Freq: Every day | ORAL | Status: DC
  Administered 2016-03-12 – 2016-03-13 (×2): 10 mg via ORAL
  Filled 2016-03-12 (×2): qty 1

## 2016-03-12 NOTE — H&P (Signed)
[place] at [place] NAME: Erica [name]    MR#:  [id number]  DATE OF BIRTH:  [birth year]  DATE OF ADMISSION:  03/12/2016  PRIMARY CARE PHYSICIAN: Kirk Ruths., MD   REQUESTING/REFERRING PHYSICIAN: Dr Harvest Dark  CHIEF COMPLAINT:   Chief Complaint  Patient presents with  . Weakness    HISTORY OF PRESENT ILLNESS:  Erica [name]  is a 82 y.o. female presenting to the hospital with slurred speech and right arm numbness. This morning she developed right arm numbness around 9 AM and that lasted about 30 minutes. A friend came over and then she developed some slurred speech. She called her daughter and then 911. The patient states that the numbness in the right arm is gone but family still thinks has some mild slurred speech. She was also on a recent antibiotic for urinary tract infection. Her legs have been weak and she's been falling a lot. Last month she had a fall and hit her head and since then she's had some blurred vision. Her hands have been cold and she has cramps.  PAST MEDICAL HISTORY:   Past Medical History  Diagnosis Date  . Nonischemic cardiomyopathy (HCC)     s/p Medtronic CRT-D, NYHA II-III at baseline  . Coronary artery disease     a. s/p prior stent-RCA b. borderline CAD on 04/2013 cath  . History of MI (myocardial infarction)   . Dyslipidemia   . Hypertension   . COPD (chronic obstructive pulmonary disease) ([place])   . Hypothyroidism   . Basal cell carcinoma 2011  . Respiratory infection   . Vitamin D deficiency   . Breast cancer ([place]) 30+ yrs. ago    right breast, radiation    PAST SURGICAL HISTORY:   Past Surgical History  Procedure Laterality Date  . Cardiac defibrillator placement    . Insert / replace / remove pacemaker    . Mastectomy    . Abdominal hysterectomy    . Basal cell carcinoma excision      Monh's procedure  . Cardiac catheterization  2014  . Ep implantable device N/A  04/05/2015    Procedure: BIV ICD Generator Changeout;  Surgeon: Deboraha Sprang, MD;  Location: [facility];  Service: Cardiovascular;  Laterality: N/A;  . Breast excisional biopsy Right 30+yrs ago    positive    SOCIAL HISTORY:   Social History  Substance Use Topics  . Smoking status: Former Research scientist (life sciences)  . Smokeless tobacco: Never Used  . Alcohol Use: No    FAMILY HISTORY:   Family History  Problem Relation Age of Onset  . Cancer Other   . Coronary artery disease Other   . Hypertension Other   . Hypertension Mother   . Stroke Mother   . Hypertension Father   . Stroke Father     DRUG ALLERGIES:   Allergies  Allergen Reactions  . Ciprofloxacin Anaphylaxis and Hives  . Ace Inhibitors Other (See Comments)    Reaction:  Unknown   . Amlodipine-Atorvastatin Other (See Comments)    Reaction:  Unknown   . Amoxicillin Itching, Rash and Other (See Comments)    Has patient had a PCN reaction causing immediate rash, facial/tongue/throat swelling, SOB or lightheadedness with hypotension: No Has patient had a PCN reaction causing severe rash involving mucus membranes or skin necrosis: No Has patient had a PCN reaction that required hospitalization No Has patient had a PCN reaction occurring within the last 10  years: Yes If all of the above answers are "NO", then may proceed with Cephalosporin use.  . Quinolones Itching and Rash    REVIEW OF SYSTEMS:  CONSTITUTIONAL: No fever, Right arm weakness. Lost 9 pounds EYES: Vision loss since fall last month. Wears glasses. EARS, NOSE, AND THROAT: No tinnitus or ear pain. No sore throat. Decreased hearing. Positive for runny nose. RESPIRATORY: Positive for cough clear phlegm, positive for shortness of breath, no wheezing or hemoptysis.  CARDIOVASCULAR: No chest pain, orthopnea, edema.  GASTROINTESTINAL: Positive for nausea, no vomiting, positive for diarrhea. Occasional abdominal pain. No blood in bowel movements GENITOURINARY: No  dysuria, hematuria. Recent urinary tract infection ENDOCRINE: No polyuria, nocturia,  HEMATOLOGY: No anemia, easy bruising or bleeding SKIN: No rash or lesion. MUSCULOSKELETAL: No joint pain or arthritis.   NEUROLOGIC: Near passing out history. Numbness right arm PSYCHIATRY: Family thinks she has depression.   MEDICATIONS AT HOME:   Prior to Admission medications   Medication Sig Start Date End Date Taking? Authorizing Provider  albuterol (PROVENTIL HFA;VENTOLIN HFA) 108 (90 BASE) MCG/ACT inhaler Inhale 2 puffs into the lungs every 6 (six) hours as needed for wheezing or shortness of breath. 12/28/13  Yes Roger A Arguello, PA-C  Ascorbic Acid (VITAMIN C) 500 MG tablet Take 500 mg by mouth daily at 12 noon.    Yes Historical Provider, MD  carvedilol (COREG) 3.125 MG tablet Take 1 tablet (3.125 mg total) by mouth 2 (two) times daily. 01/23/16  Yes Minna Merritts, MD  furosemide (LASIX) 20 MG tablet Take 20 mg by mouth daily as needed for edema.   Yes Historical Provider, MD  levothyroxine (SYNTHROID, LEVOTHROID) 75 MCG tablet Take 75 mcg by mouth daily before breakfast.    Yes Historical Provider, MD  LORazepam (ATIVAN) 1 MG tablet Take 1 mg by mouth at bedtime as needed for anxiety or sleep.    Yes Historical Provider, MD  Multiple Vitamins-Minerals (MULTIVITAMIN GUMMIES ADULT PO) Take 1 each by mouth daily at 12 noon.    Yes Historical Provider, MD  pravastatin (PRAVACHOL) 10 MG tablet Take 10 mg by mouth at bedtime.    Yes Historical Provider, MD  vitamin B-12 (CYANOCOBALAMIN) 1000 MCG tablet Take 1,000 mcg by mouth daily at 12 noon.    Yes Historical Provider, MD      VITAL SIGNS:  Blood pressure 167/94, pulse 94, temperature 97.9 F (36.6 C), temperature source Oral, resp. rate 18, height 5' 3" (1.6 m), weight 63.957 kg (141 lb), SpO2 97 %.  PHYSICAL EXAMINATION:  GENERAL:  82 y.o.-year-old patient lying in the bed with no acute distress.  EYES: Pupils equal, round, reactive to light  and accommodation. No scleral icterus. Extraocular muscles intact. Vision 20/70 right eye with glasses and close vision. Left eye 20/200. HEENT: Head atraumatic, normocephalic. Oropharynx and nasopharynx clear.  NECK:  Supple, no jugular venous distention. No thyroid enlargement, no tenderness.  LUNGS: Decreased breath sounds bilaterally, no wheezing, rales,rhonchi or crepitation. No use of accessory muscles of respiration.  CARDIOVASCULAR: S1, S2 normal. No murmurs, rubs, or gallops.  ABDOMEN: Soft, nontender, nondistended. Bowel sounds present. No organomegaly or mass.  EXTREMITIES: No pedal edema, cyanosis, or clubbing.  NEUROLOGIC: Cranial nerves II through XII are intact. Intermittent speech slurred. Muscle strength 5/5 in all extremities. Sensation intact. Gait not checked.  PSYCHIATRIC: The patient is alert and oriented x 3.  SKIN: No rash, lesion, or ulcer.   LABORATORY PANEL:   CBC  Recent Labs Lab 03/12/16 1415  WBC 7.6  HGB 12.4  HCT 36.1  PLT 202   ------------------------------------------------------------------------------------------------------------------  Chemistries   Recent Labs Lab 03/12/16 1315  NA 132*  K 5.0  CL 102  CO2 21*  GLUCOSE 122*  BUN 14  CREATININE 1.19*  CALCIUM 8.8*   ------------------------------------------------------------------------------------------------------------------  Cardiac Enzymes  Recent Labs Lab 03/12/16 1415  TROPONINI 0.05*   ------------------------------------------------------------------------------------------------------------------  RADIOLOGY:  Ct Head Wo Contrast  03/12/2016  CLINICAL DATA:  Right arm numbness this morning. Blurry vision for 1 week. EXAM: CT HEAD WITHOUT CONTRAST TECHNIQUE: Contiguous axial images were obtained from the base of the skull through the vertex without contrast. COMPARISON:  02/08/2016 FINDINGS: Stable low-density in the periventricular and subcortical white matter. No  evidence for acute hemorrhage, mass lesion, midline shift, hydrocephalus or large infarct. Visualized paranasal sinuses are clear. No calvarial fracture. IMPRESSION: No acute intracranial abnormality. Stable white matter disease. Findings likely represent chronic small vessel ischemic changes. Electronically Signed   By: Markus Daft M.D.   On: 03/12/2016 15:26    EKG:   Atrial paced 93 bpm  IMPRESSION AND PLAN:   1. Slurred speech and right arm numbness concerning for stroke. Unable to get an MRI of the brain secondary to defibrillator. Repeat a CT scan tomorrow afternoon. Echocardiogram and carotid ultrasound ordered. Take a step up the Plavix. Patient unable to tolerate high intensity statin continue her usual statin dose. Check a lipid profile. Get physical therapy occupational therapy and speech therapy evaluations. Check an ESR. 2. Cough we'll get a chest x-ray and give nebulizer treatments 3. Vision loss since fall last month. Check an ESR. Patient has appointment with ophthalmologist as outpatient. 4. Essential hypertension continue usual medications permissive hypertension at this point. 5. Hyponatremia and slightly elevated creatinine. Give 1 small fluid bolus and recheck in a.m. 6. Elevated troponin likely secondary to stroke.  All the records are reviewed and case discussed with ED provider. Management plans discussed with the patient, family and they are in agreement.  CODE STATUS: Full code  TOTAL TIME TAKING CARE OF THIS PATIENT: 50 minutes.    Loletha Grayer M.D on 03/12/2016 at 4:38 PM  Between 7am to 6pm - Pager - [phone]  After 6pm call admission pager [phone]  [facility]  [phone]  CC: Primary care physician; Kirk Ruths., MD

## 2016-03-12 NOTE — ED Notes (Addendum)
Patient transported to Ultrasound 

## 2016-03-12 NOTE — ED Notes (Signed)
Attempted to call report, RN unavailable at this time and will return my call.

## 2016-03-12 NOTE — ED Provider Notes (Signed)
[facility] Emergency Department Provider Note  Time seen: 2:10 PM  I have reviewed the triage vital signs and the nursing notes.   HISTORY  Chief Complaint Weakness    HPI Elsha Pangburn is a 82 y.o. female with a past medical history of CAD, MI, dyslipidemia, hypertension, COPD, presents to the emergency department with right arm numbness and slurred speech. According to the patient around 11:00 this morning she developed right arm numbness with difficulty speaking. She called the daughter, daughter states she cannot understand her words because of how slurred they were. Symptoms lasted approximately 30 minutes and then slowly resolved. Upon arrival to the emergency department the patient states she is back to baseline, has normal feeling in her arm, and is speaking normally. Patient denies any weakness or numbness in her leg. Denies any headache.     Past Medical History  Diagnosis Date  . Nonischemic cardiomyopathy (HCC)     s/p Medtronic CRT-D, NYHA II-III at baseline  . Coronary artery disease     a. s/p prior stent-RCA b. borderline CAD on 04/2013 cath  . History of MI (myocardial infarction)   . Dyslipidemia   . Hypertension   . COPD (chronic obstructive pulmonary disease) ([place])   . Hypothyroidism   . Basal cell carcinoma 2011  . Respiratory infection   . Vitamin D deficiency   . Breast cancer ([place]) 30+ yrs. ago    right breast, radiation    Patient Active Problem List   Diagnosis Date Noted  . Gait instability 01/08/2016  . Chronic systolic heart failure ([place]) 02/02/2015  . Cough 01/18/2014  . Weakness generalized 01/18/2014  . Acute bronchitis 12/28/2013  . Nonischemic cardiomyopathy ([place]) 12/28/2013  . Orthostatic hypotension 04/06/2012  . Biventricular implantable cardioverter-defibrillator in situ 04/04/2011  . HYPERLIPIDEMIA, MIXED 10/23/2009  . Essential hypertension 10/23/2009  . Coronary atherosclerosis 10/23/2009    Past  Surgical History  Procedure Laterality Date  . Cardiac defibrillator placement    . Insert / replace / remove pacemaker    . Mastectomy    . Abdominal hysterectomy    . Basal cell carcinoma excision      Monh's procedure  . Cardiac catheterization  2014  . Ep implantable device N/A 04/05/2015    Procedure: BIV ICD Generator Changeout;  Surgeon: Deboraha Sprang, MD;  Location: [facility];  Service: Cardiovascular;  Laterality: N/A;  . Breast excisional biopsy Right 30+yrs ago    positive    Current Outpatient Rx  Name  Route  Sig  Dispense  Refill  . albuterol (PROVENTIL HFA;VENTOLIN HFA) 108 (90 BASE) MCG/ACT inhaler   Inhalation   Inhale 2 puffs into the lungs every 6 (six) hours as needed for wheezing or shortness of breath.   1 Inhaler   0   . Ascorbic Acid (VITAMIN C) 500 MG tablet   Oral   Take 500 mg by mouth as needed.          Marland Kitchen aspirin 81 MG EC tablet   Oral   Take 81 mg by mouth daily.           . carvedilol (COREG) 3.125 MG tablet   Oral   Take 1 tablet (3.125 mg total) by mouth 2 (two) times daily.   180 tablet   3   . furosemide (LASIX) 20 MG tablet   Oral   Take 1 tablet (20 mg total) by mouth every other day.   45 tablet  3   . levothyroxine (SYNTHROID, LEVOTHROID) 75 MCG tablet   Oral   Take 75 mcg by mouth daily.         Marland Kitchen LORazepam (ATIVAN) 1 MG tablet   Oral   Take 1 mg by mouth at bedtime as needed for sleep.          Marland Kitchen losartan (COZAAR) 25 MG tablet   Oral   Take 25 mg by mouth daily.          Marland Kitchen losartan (COZAAR) 25 MG tablet      TAKE 1 TABLET (25 MG TOTAL) BY MOUTH DAILY.   30 tablet   11   . Multiple Vitamins-Minerals (MULTIVITAMIN GUMMIES ADULT PO)   Oral   Take by mouth daily.         Marland Kitchen omeprazole (PRILOSEC) 20 MG capsule   Oral   Take 20 mg by mouth as needed.         . pravastatin (PRAVACHOL) 10 MG tablet   Oral   Take 10 mg by mouth every other day.       0   . sulfamethoxazole-trimethoprim  (BACTRIM DS,SEPTRA DS) 800-160 MG tablet   Oral   Take 1 tablet by mouth 2 (two) times daily.   6 tablet   0   . vitamin B-12 (CYANOCOBALAMIN) 1000 MCG tablet   Oral   Take 1,000 mcg by mouth daily.             Allergies Ace inhibitors; Amlodipine-atorvastatin; Ciprofloxacin; and Quinolones  Family History  Problem Relation Age of Onset  . Cancer Other   . Coronary artery disease Other   . Hypertension Other   . Hypertension Mother   . Hypertension Father     Social History Social History  Substance Use Topics  . Smoking status: Never Smoker   . Smokeless tobacco: Never Used  . Alcohol Use: No    Review of Systems Constitutional: Negative for fever. Cardiovascular: Negative for chest pain. Respiratory: Negative for shortness of breath. Gastrointestinal: Negative for abdominal pain Musculoskeletal: Negative for back pain. Neurological: Negative for headache. Right arm numbness which has resolved. Difficulty speaking which has resolved. 10-point ROS otherwise negative.  ____________________________________________   PHYSICAL EXAM:  VITAL SIGNS: ED Triage Vitals  Enc Vitals Group     BP 03/12/16 1301 154/87 mmHg     Pulse Rate 03/12/16 1301 92     Resp 03/12/16 1301 20     Temp 03/12/16 1301 97.9 F (36.6 C)     Temp Source 03/12/16 1301 Oral     SpO2 03/12/16 1301 94 %     Weight 03/12/16 1301 141 lb (63.957 kg)     Height 03/12/16 1301 5\' 3"  (1.6 m)     Head Cir --      Peak Flow --      Pain Score --      Pain Loc --      Pain Edu? --      Excl. in [place]? --     Constitutional: Alert and oriented. Well appearing and in no distress. Eyes: Normal exam ENT   Head: Normocephalic and atraumatic.   Mouth/Throat: Mucous membranes are moist. Cardiovascular: Normal rate, regular rhythm. No murmur Respiratory: Normal respiratory effort without tachypnea nor retractions. Breath sounds are clear Gastrointestinal: Soft and nontender. No distention.    Musculoskeletal: Nontender with normal range of motion in all extremities.  Neurologic:  Normal speech and language. No gross focal neurologic deficits. 5/5  motor in all extremities. Normal sensation in all extremities. No cranial nerve deficits. No pronator drift. Skin:  Skin is warm, dry and intact.  Psychiatric: Mood and affect are normal.   NIH stroke scale: 0  ____________________________________________    EKG  EKG reviewed and interpreted by myself shows an atrial sensed ventricular paced rhythm at 93 bpm, nonspecific ST changes.  ____________________________________________    RADIOLOGY  CT head shows no acute abnormality  ____________________________________________    INITIAL IMPRESSION / ASSESSMENT AND PLAN / ED COURSE  Pertinent labs & imaging results that were available during my care of the patient were reviewed by me and considered in my medical decision making (see chart for details).  Patient presents the emergency department right arm numbness and difficulty speaking starting around 11 AM which has now completely resolved. Patient's story very consistent with transient ischemic attack. We will check labs, CT head, and plan to admit to the hospital for further workup.  Labs are largely within normal limits besides a slight troponin elevation. CT is negative. I discussed results with the patient. We'll admit to the hospital for likely TIA.  NIHSS: 0  ____________________________________________   FINAL CLINICAL IMPRESSION(S) / ED DIAGNOSES  transient ischemic attack   Harvest Dark, MD 03/12/16 1550

## 2016-03-12 NOTE — ED Notes (Signed)
Patient transported to CT 

## 2016-03-12 NOTE — ED Notes (Signed)
Pt's daughter states that her mother called her at 11:00 and had slurred speech and was c/o numbness on her right side.  No slurred speech at this time and denies any numbness currently.

## 2016-03-12 NOTE — Progress Notes (Signed)
Received pt via bed to 112. Alert. No resp distress. Vs taken. Moniter. Pt has pacemaker. dtr  With pt.  Denies pain.

## 2016-03-12 NOTE — ED Notes (Signed)
Pt presents with weakness and dizziness for a couple of days, states she lives at home alone and doesn't eat or drink as well as she should. Pt daughters states that she has been weak and dizzy for a couple of days and noted some slurred speech this am. Pt just saw PCP yesterday for same and was told to come to ed for further eval. No slurred speech noted.

## 2016-03-13 ENCOUNTER — Inpatient Hospital Stay: Payer: PPO

## 2016-03-13 DIAGNOSIS — R748 Abnormal levels of other serum enzymes: Secondary | ICD-10-CM | POA: Diagnosis not present

## 2016-03-13 DIAGNOSIS — R7989 Other specified abnormal findings of blood chemistry: Secondary | ICD-10-CM | POA: Diagnosis not present

## 2016-03-13 DIAGNOSIS — I638 Other cerebral infarction: Secondary | ICD-10-CM | POA: Diagnosis not present

## 2016-03-13 DIAGNOSIS — I951 Orthostatic hypotension: Secondary | ICD-10-CM | POA: Diagnosis not present

## 2016-03-13 DIAGNOSIS — I639 Cerebral infarction, unspecified: Secondary | ICD-10-CM | POA: Diagnosis not present

## 2016-03-13 DIAGNOSIS — G459 Transient cerebral ischemic attack, unspecified: Secondary | ICD-10-CM | POA: Diagnosis not present

## 2016-03-13 DIAGNOSIS — R778 Other specified abnormalities of plasma proteins: Secondary | ICD-10-CM | POA: Insufficient documentation

## 2016-03-13 DIAGNOSIS — I10 Essential (primary) hypertension: Secondary | ICD-10-CM | POA: Diagnosis not present

## 2016-03-13 LAB — LIPID PANEL
CHOL/HDL RATIO: 3.3 ratio
CHOLESTEROL: 149 mg/dL (ref 0–200)
HDL: 45 mg/dL (ref >40)
LDL Cholesterol: 81 mg/dL (ref 0–99)
TRIGLYCERIDES: 115 mg/dL (ref <150)
VLDL: 23 mg/dL (ref 0–40)

## 2016-03-13 LAB — BASIC METABOLIC PANEL
Anion gap: 7 (ref 5–15)
BUN: 14 mg/dL (ref 6–20)
CALCIUM: 8.9 mg/dL (ref 8.9–10.3)
CO2: 26 mmol/L (ref 22–32)
Chloride: 103 mmol/L (ref 101–111)
Creatinine, Ser: 1.13 mg/dL — ABNORMAL HIGH (ref 0.44–1.00)
GFR, EST AFRICAN AMERICAN: 51 mL/min — AB (ref >60)
GFR, EST NON AFRICAN AMERICAN: 44 mL/min — AB (ref >60)
Glucose, Bld: 109 mg/dL — ABNORMAL HIGH (ref 65–99)
Potassium: 4 mmol/L (ref 3.5–5.1)
Sodium: 136 mmol/L (ref 135–145)

## 2016-03-13 LAB — CBC
HCT: 35 % (ref 35.0–47.0)
HEMOGLOBIN: 11.9 g/dL — AB (ref 12.0–16.0)
MCH: 30.2 pg (ref 26.0–34.0)
MCHC: 34 g/dL (ref 32.0–36.0)
MCV: 88.8 fL (ref 80.0–100.0)
Platelets: 217 10*3/uL (ref 150–440)
RBC: 3.94 MIL/uL (ref 3.80–5.20)
RDW: 14.3 % (ref 11.5–14.5)
WBC: 6.1 10*3/uL (ref 3.6–11.0)

## 2016-03-13 LAB — ECHOCARDIOGRAM COMPLETE
HEIGHTINCHES: 61 in
WEIGHTICAEL: 2243.2 [oz_av]

## 2016-03-13 LAB — BRAIN NATRIURETIC PEPTIDE: B NATRIURETIC PEPTIDE 5: 557 pg/mL — AB (ref 0.0–100.0)

## 2016-03-13 LAB — TROPONIN I
TROPONIN I: 0.59 ng/mL — AB (ref <0.031)
Troponin I: 0.51 ng/mL — ABNORMAL HIGH (ref <0.031)
Troponin I: 0.58 ng/mL — ABNORMAL HIGH (ref <0.031)
Troponin I: 0.44 ng/mL — ABNORMAL HIGH (ref <0.031)
Troponin I: 0.37 ng/mL — ABNORMAL HIGH (ref <0.031)

## 2016-03-13 MED ORDER — POTASSIUM CHLORIDE CRYS ER 10 MEQ PO TBCR
10.0000 meq | EXTENDED_RELEASE_TABLET | Freq: Two times a day (BID) | ORAL | Status: DC
  Administered 2016-03-13 – 2016-03-14 (×3): 10 meq via ORAL
  Filled 2016-03-13 (×3): qty 1

## 2016-03-13 MED ORDER — FUROSEMIDE 10 MG/ML IJ SOLN
20.0000 mg | Freq: Two times a day (BID) | INTRAMUSCULAR | Status: DC
  Administered 2016-03-13: 15:00:00 20 mg via INTRAVENOUS

## 2016-03-13 MED ORDER — BENZONATATE 100 MG PO CAPS
100.0000 mg | ORAL_CAPSULE | Freq: Three times a day (TID) | ORAL | Status: DC
  Administered 2016-03-13 – 2016-03-14 (×5): 100 mg via ORAL
  Filled 2016-03-13 (×5): qty 1

## 2016-03-13 MED ORDER — FUROSEMIDE 10 MG/ML IJ SOLN
20.0000 mg | Freq: Two times a day (BID) | INTRAMUSCULAR | Status: DC
  Administered 2016-03-13: 20 mg via INTRAVENOUS
  Filled 2016-03-13 (×2): qty 2

## 2016-03-13 MED ORDER — ENOXAPARIN SODIUM 80 MG/0.8ML ~~LOC~~ SOLN
1.0000 mg/kg | Freq: Two times a day (BID) | SUBCUTANEOUS | Status: DC
  Administered 2016-03-13: 06:00:00 65 mg via SUBCUTANEOUS
  Filled 2016-03-13 (×3): qty 0.8

## 2016-03-13 MED ORDER — ENOXAPARIN SODIUM 40 MG/0.4ML ~~LOC~~ SOLN
40.0000 mg | SUBCUTANEOUS | Status: DC
  Administered 2016-03-13: 21:00:00 40 mg via SUBCUTANEOUS
  Filled 2016-03-13: qty 0.4

## 2016-03-13 MED ORDER — FUROSEMIDE 20 MG PO TABS: 20.0000 mg | ORAL_TABLET | Freq: Every day | ORAL | Status: DC

## 2016-03-13 NOTE — Progress Notes (Signed)
OT Cancellation Note  Patient Details Name: Erica [name] MRN: [id number] DOB: May 01, 1931   Cancelled Treatment:    Reason Eval/Treat Not Completed: Medical issues which prohibited therapy (Tropinin levels are trending upward. OT skilled services are contrindicated at this time.  Will continue to monitor, and intervene as appropriate.)   Harrel Carina, MS, OTR/L   Harrel Carina 03/13/2016, 8:56 AM

## 2016-03-13 NOTE — Progress Notes (Signed)
ANTICOAGULATION CONSULT NOTE - Initial Consult  Pharmacy Consult for enoxaparin Indication: chest pain/ACS  Allergies  Allergen Reactions  . Ciprofloxacin Anaphylaxis and Hives  . Ace Inhibitors Other (See Comments)    Reaction:  Unknown   . Amlodipine-Atorvastatin Other (See Comments)    Reaction:  Unknown   . Amoxicillin Itching, Rash and Other (See Comments)    Has patient had a PCN reaction causing immediate rash, facial/tongue/throat swelling, SOB or lightheadedness with hypotension: No Has patient had a PCN reaction causing severe rash involving mucus membranes or skin necrosis: No Has patient had a PCN reaction that required hospitalization No Has patient had a PCN reaction occurring within the last 10 years: Yes If all of the above answers are "NO", then may proceed with Cephalosporin use.  . Quinolones Itching and Rash    Patient Measurements: Height: 5\' 1"  (154.9 cm) Weight: 140 lb 3.2 oz (63.594 kg) IBW/kg (Calculated) : 47.8 Heparin Dosing Weight:   Vital Signs: Temp: 97.6 F (36.4 C) (04/26 1848) Temp Source: Oral (04/26 1848) BP: 136/64 mmHg (04/26 2253) Pulse Rate: 87 (04/26 2253)  Labs:  Recent Labs  03/12/16 1315  03/12/16 1415 03/12/16 1826 03/12/16 2201 03/13/16 0319  HGB  --   --  12.4  --   --  11.9*  HCT  --   --  36.1  --   --  35.0  PLT  --   --  202  --   --  217  CREATININE 1.19*  --   --   --   --  1.13*  TROPONINI  --   < > 0.05* 0.06* 0.13* 0.51*  < > = values in this interval not displayed.  Estimated Creatinine Clearance: 32.2 mL/min (by C-G formula based on Cr of 1.13).   Medical History: Past Medical History  Diagnosis Date  . Nonischemic cardiomyopathy (HCC)     s/p Medtronic CRT-D, NYHA II-III at baseline  . Coronary artery disease     a. s/p prior stent-RCA b. borderline CAD on 04/2013 cath  . History of MI (myocardial infarction)   . Dyslipidemia   . Hypertension   . COPD (chronic obstructive pulmonary disease) ([place])    . Hypothyroidism   . Basal cell carcinoma 2011  . Respiratory infection   . Vitamin D deficiency   . Breast cancer ([place]) 30+ yrs. ago    right breast, radiation    Medications:  Infusions:    Assessment: 23 yof admitted with weakness/slurred speech and right arm numbness. In patient has had rising troponin. Pharmacy consulted to dose LMWH for ACS.   Goal of Therapy:  Monitor platelets by anticoagulation protocol: Yes   Plan:  Lovenox 1 mg/kg subcutaneously BID.   Laural Benes, Pharm.D., BCPS Clinical Pharmacist 03/13/2016,5:08 AM

## 2016-03-13 NOTE — Consult Note (Signed)
Referring Physician: Tressia Miners    Chief Complaint: Slurred speech and RUE numbness  HPI: Erica [name] is an 82 y.o. female with multiple medical problems who presented on yesterday after acute onset slurred speech and RUE numbness.  On yesterday morning the atient first noticed the numbness.  Then later a friend came over and when speaking noted that she had slurred speech as well.  Daughter and 911 were called.  When EMS arrived her symptoms were starting to resolve.  They stayed with her until her daughter showed up who brought her to the hospital. By the time she presented to the hospital her symptoms had resolved.  Initial NIHSS of 0.   Patient lives with daughter.    Date last known well: 03/12/2016 Time last known well: Time: 09:00 tPA Given: No: Resolution of symptoms  Past Medical History  Diagnosis Date  . Nonischemic cardiomyopathy (HCC)     s/p Medtronic CRT-D, NYHA II-III at baseline  . Coronary artery disease     a. s/p prior stent-RCA b. borderline CAD on 04/2013 cath  . History of MI (myocardial infarction)   . Dyslipidemia   . Hypertension   . COPD (chronic obstructive pulmonary disease) ([place])   . Hypothyroidism   . Basal cell carcinoma 2011  . Respiratory infection   . Vitamin D deficiency   . Breast cancer ([place]) 30+ yrs. ago    right breast, radiation    Past Surgical History  Procedure Laterality Date  . Cardiac defibrillator placement    . Insert / replace / remove pacemaker    . Mastectomy    . Abdominal hysterectomy    . Basal cell carcinoma excision      Monh's procedure  . Cardiac catheterization  2014  . Ep implantable device N/A 04/05/2015    Procedure: BIV ICD Generator Changeout;  Surgeon: Deboraha Sprang, MD;  Location: [facility];  Service: Cardiovascular;  Laterality: N/A;  . Breast excisional biopsy Right 30+yrs ago    positive    Family History  Problem Relation Age of Onset  . Cancer Other   . Coronary artery disease Other   .  Hypertension Other   . Hypertension Mother   . Stroke Mother   . Hypertension Father   . Stroke Father    Social History:  reports that she has quit smoking. She has never used smokeless tobacco. She reports that she does not drink alcohol or use illicit drugs.  Allergies:  Allergies  Allergen Reactions  . Ciprofloxacin Anaphylaxis and Hives  . Ace Inhibitors Other (See Comments)    Reaction:  Unknown   . Amlodipine-Atorvastatin Other (See Comments)    Reaction:  Unknown   . Amoxicillin Itching, Rash and Other (See Comments)    Has patient had a PCN reaction causing immediate rash, facial/tongue/throat swelling, SOB or lightheadedness with hypotension: No Has patient had a PCN reaction causing severe rash involving mucus membranes or skin necrosis: No Has patient had a PCN reaction that required hospitalization No Has patient had a PCN reaction occurring within the last 10 years: Yes If all of the above answers are "NO", then may proceed with Cephalosporin use.  . Quinolones Itching and Rash    Medications:  I have reviewed the patient's current medications. Prior to Admission:  Prescriptions prior to admission  Medication Sig Dispense Refill Last Dose  . albuterol (PROVENTIL HFA;VENTOLIN HFA) 108 (90 BASE) MCG/ACT inhaler Inhale 2 puffs into the lungs every 6 (six) hours  as needed for wheezing or shortness of breath. 1 Inhaler 0 03/12/2016 at Unknown time  . Ascorbic Acid (VITAMIN C) 500 MG tablet Take 500 mg by mouth daily at 12 noon.    03/11/2016 at Unknown time  . carvedilol (COREG) 3.125 MG tablet Take 1 tablet (3.125 mg total) by mouth 2 (two) times daily. 180 tablet 3 03/12/2016 at 0900  . furosemide (LASIX) 20 MG tablet Take 20 mg by mouth daily as needed for edema.   Past Week at Unknown time  . levothyroxine (SYNTHROID, LEVOTHROID) 75 MCG tablet Take 75 mcg by mouth daily before breakfast.    03/12/2016 at Unknown time  . LORazepam (ATIVAN) 1 MG tablet Take 1 mg by mouth at  bedtime as needed for anxiety or sleep.    03/11/2016 at Unknown time  . Multiple Vitamins-Minerals (MULTIVITAMIN GUMMIES ADULT PO) Take 1 each by mouth daily at 12 noon.    03/11/2016 at Unknown time  . pravastatin (PRAVACHOL) 10 MG tablet Take 10 mg by mouth at bedtime.   0 03/11/2016 at Unknown time  . vitamin B-12 (CYANOCOBALAMIN) 1000 MCG tablet Take 1,000 mcg by mouth daily at 12 noon.    03/11/2016 at Unknown time   Scheduled: . benzonatate  100 mg Oral TID  . carvedilol  3.125 mg Oral BID  . clopidogrel  75 mg Oral Daily  . enoxaparin (LOVENOX) injection  40 mg Subcutaneous Q24H  . furosemide  20 mg Intravenous BID  . levothyroxine  75 mcg Oral QAC breakfast  . multivitamin with minerals  1 tablet Oral Q1200  . potassium chloride  10 mEq Oral BID  . pravastatin  10 mg Oral QHS  . vitamin B-12  1,000 mcg Oral Q1200  . vitamin C  500 mg Oral Q1200    ROS: History obtained from the patient  General ROS: negative for - chills, fatigue, fever, night sweats, weight gain or weight loss Psychological ROS: negative for - behavioral disorder, hallucinations, memory difficulties, mood swings or suicidal ideation Ophthalmic ROS:  blurry vision ENT ROS: dizziness spells, HOH Allergy and Immunology ROS: negative for - hives or itchy/watery eyes Hematological and Lymphatic ROS: negative for - bleeding problems, bruising or swollen lymph nodes Endocrine ROS: negative for - galactorrhea, hair pattern changes, polydipsia/polyuria or temperature intolerance Respiratory ROS: cough Cardiovascular ROS: negative for - chest pain, dyspnea on exertion, edema or irregular heartbeat Gastrointestinal ROS: negative for - abdominal pain, diarrhea, hematemesis, nausea/vomiting or stool incontinence Genito-Urinary ROS: negative for - dysuria, hematuria, incontinence or urinary frequency/urgency Musculoskeletal ROS: negative for - joint swelling or muscular weakness Neurological ROS: as noted in  HPI Dermatological ROS: negative for rash and skin lesion changes  Physical Examination: Blood pressure 128/57, pulse 82, temperature 98.4 F (36.9 C), temperature source Oral, resp. rate 20, height 5\' 1"  (1.549 m), weight 63.594 kg (140 lb 3.2 oz), SpO2 96 %.  HEENT-  Normocephalic, no lesions, without obvious abnormality.  Normal external eye and conjunctiva.  Normal TM's bilaterally.  Normal auditory canals and external ears. Normal external nose, mucus membranes and septum.  Normal pharynx. Cardiovascular- S1, S2 normal, pulses palpable throughout   Lungs- chest clear, no wheezing, rales, normal symmetric air entry Abdomen- soft, non-tender; bowel sounds normal; no masses,  no organomegaly Extremities- no edema Lymph-no adenopathy palpable Musculoskeletal-no joint tenderness, deformity or swelling Skin-warm and dry, no hyperpigmentation, vitiligo, or suspicious lesions  Neurological Examination Mental Status: Alert, oriented, thought content appropriate.  Speech fluent without evidence of aphasia.  Able to follow 3 step commands without difficulty. Cranial Nerves: II: Discs flat bilaterally; Visual fields grossly normal with decreased vision from the left eye as compared to the right, pupils equal, round, reactive to light and accommodation III,IV, VI: ptosis not present, extra-ocular motions intact bilaterally V,VII: smile symmetric, facial light touch sensation normal bilaterally VIII: hearing decreased bilaterally IX,X: gag reflex present XI: bilateral shoulder shrug XII: midline tongue extension Motor: Right : Upper extremity   5/5    Left:     Upper extremity   5/5  Lower extremity   5/5     Lower extremity   5/5 Tone and bulk:normal tone throughout; no atrophy noted Sensory: Pinprick and light touch intact throughout, bilaterally Deep Tendon Reflexes: 2+ and symmetric with absent AJ's bilaterally Plantars: Right: upgoing   Left: upgoing Cerebellar: Normal finger-to-nose  and normal heel-to-shin testing bilaterally Gait: not tested due to safety concerns   Laboratory Studies:  Basic Metabolic Panel:  Recent Labs Lab 03/12/16 1315 03/13/16 0319  NA 132* 136  K 5.0 4.0  CL 102 103  CO2 21* 26  GLUCOSE 122* 109*  BUN 14 14  CREATININE 1.19* 1.13*  CALCIUM 8.8* 8.9    Liver Function Tests: No results for input(s): AST, ALT, ALKPHOS, BILITOT, PROT, ALBUMIN in the last 168 hours. No results for input(s): LIPASE, AMYLASE in the last 168 hours. No results for input(s): AMMONIA in the last 168 hours.  CBC:  Recent Labs Lab 03/12/16 1415 03/13/16 0319  WBC 7.6 6.1  HGB 12.4 11.9*  HCT 36.1 35.0  MCV 88.9 88.8  PLT 202 217    Cardiac Enzymes:  Recent Labs Lab 03/12/16 1826 03/12/16 2201 03/13/16 0319 03/13/16 0713 03/13/16 1056  TROPONINI 0.06* 0.13* 0.51* 0.58* 0.59*    BNP: Invalid input(s): POCBNP  CBG: No results for input(s): GLUCAP in the last 168 hours.  Microbiology: Results for orders placed or performed during the hospital encounter of 04/05/15  Surgical pcr screen     Status: None   Collection Time: 04/05/15  1:23 PM  Result Value Ref Range Status   MRSA, PCR NEGATIVE NEGATIVE Final   Staphylococcus aureus NEGATIVE NEGATIVE Final    Comment:        The Xpert SA Assay (FDA approved for NASAL specimens in patients over 53 years of age), is one component of a comprehensive surveillance program.  Test performance has been validated by [facility] for patients greater than or equal to 55 year old. It is not intended to diagnose infection nor to guide or monitor treatment.     Coagulation Studies: No results for input(s): LABPROT, INR in the last 72 hours.  Urinalysis:  Recent Labs Lab 03/12/16 1451  COLORURINE STRAW*  LABSPEC 1.004*  PHURINE 6.0  GLUCOSEU NEGATIVE  HGBUR NEGATIVE  BILIRUBINUR NEGATIVE  KETONESUR NEGATIVE  PROTEINUR NEGATIVE  NITRITE NEGATIVE  LEUKOCYTESUR NEGATIVE    Lipid  Panel:    Component Value Date/Time   CHOL 149 03/13/2016 0319   TRIG 115 03/13/2016 0319   HDL 45 03/13/2016 0319   CHOLHDL 3.3 03/13/2016 0319   VLDL 23 03/13/2016 0319   LDLCALC 81 03/13/2016 0319    HgbA1C: No results found for: HGBA1C  Urine Drug Screen:  No results found for: LABOPIA, COCAINSCRNUR, LABBENZ, AMPHETMU, THCU, LABBARB  Alcohol Level: No results for input(s): ETH in the last 168 hours.  Other results: EKG: Paced rhythm at 83 bpm.  Imaging: Dg Chest 2 View  03/12/2016  CLINICAL  DATA:  Shortness of breath EXAM: CHEST  2 VIEW COMPARISON:  12/28/2013 FINDINGS: There is diffuse bilateral chronic interstitial thickening. There is superimposed more severe interstitial prominence most notably at the lung bases. There is no pneumothorax. There is no significant pleural effusion. There is stable cardiomegaly. There is an AICD noted. The osseous structures are unremarkable. IMPRESSION: 1. Bilateral diffuse interstitial thickening superimposed upon chronic interstitial lung disease. These findings are concerning for CHF. Electronically Signed   By: Kathreen Devoid   On: 03/12/2016 17:22   Ct Head Wo Contrast  03/12/2016  CLINICAL DATA:  Right arm numbness this morning. Blurry vision for 1 week. EXAM: CT HEAD WITHOUT CONTRAST TECHNIQUE: Contiguous axial images were obtained from the base of the skull through the vertex without contrast. COMPARISON:  02/08/2016 FINDINGS: Stable low-density in the periventricular and subcortical white matter. No evidence for acute hemorrhage, mass lesion, midline shift, hydrocephalus or large infarct. Visualized paranasal sinuses are clear. No calvarial fracture. IMPRESSION: No acute intracranial abnormality. Stable white matter disease. Findings likely represent chronic small vessel ischemic changes. Electronically Signed   By: Markus Daft M.D.   On: 03/12/2016 15:26   US Carotid Bilateral  03/12/2016  CLINICAL DATA:  CVA. EXAM: BILATERAL CAROTID DUPLEX  ULTRASOUND TECHNIQUE: Pearline Cables scale imaging, color Doppler and duplex ultrasound were performed of bilateral carotid and vertebral arteries in the neck. COMPARISON:  None. FINDINGS: Criteria: Quantification of carotid stenosis is based on velocity parameters that correlate the residual internal carotid diameter with NASCET-based stenosis levels, using the diameter of the distal internal carotid lumen as the denominator for stenosis measurement. The following velocity measurements were obtained: RIGHT ICA:  43 cm/sec CCA:  59 cm/sec SYSTOLIC ICA/CCA RATIO:  [id number] DIASTOLIC ICA/CCA RATIO:  1.1 ECA:  102 cm/sec LEFT ICA:  58 cm/sec CCA:  63 cm/sec SYSTOLIC ICA/CCA RATIO:  [id number] DIASTOLIC ICA/CCA RATIO:  1.4 ECA:  67 cm/sec RIGHT CAROTID ARTERY: No appreciable plaque in the right carotid bulb or bifurcation. No hemodynamically significant stenosis in the right internal carotid artery by Doppler criteria. RIGHT VERTEBRAL ARTERY:  Normal antegrade flow. LEFT CAROTID ARTERY: No appreciable plaque in the left carotid bulb and bifurcation. No hemodynamically significant stenosis in the left internal carotid artery by Doppler criteria. LEFT VERTEBRAL ARTERY:  Normal antegrade flow. IMPRESSION: 1. No hemodynamically significant stenosis in either carotid (0- 49%). 2. Normal antegrade flow in both vertebral arteries. Electronically Signed   By: Ilona Sorrel M.D.   On: 03/12/2016 17:35    Assessment: 82 y.o. female presenting after resolution of an episode of slurred speech and RUE numbness.  Patient on ASA at home prior to admission.  Head CT personally reviewed and shows no acute changes.  MRI unable to be performed due to pacer.  LDL 81 with patient on a statin.  Patient unable to tolerate high intensity statins.  Carotid dopplers show no hemodynamically significant stenosis.  Echocardiogram results pending.    Stroke Risk Factors - hyperlipidemia and hypertension  Plan: 1. PT consult, OT consult, Speech consult 2.  Prophylactic therapy-Agree with change to Plavix 3. Telemetry monitoring 4. Frequent neuro checks 5. Keep appointment with Ophthalmology    Alexis Goodell, MD Neurology [phone] 03/13/2016, 1:37 PM

## 2016-03-13 NOTE — Consult Note (Signed)
Cardiology Consultation Note  Patient ID: Erica [name], MRN: [id number], DOB/AGE: [birth year] 82 y.o. Admit date: 03/12/2016   Date of Consult: 03/13/2016 Primary Physician: Kirk Ruths., MD Primary Cardiologist: Dr. Rockey Situ, MD  Chief Complaint: Slurred speech and right UE paresthesia  Reason for Consult: Elevated troponin in the setting of possible acute stroke  HPI: 82 y.o. female with h/o CAD s/p remote stenting to RCA, NICM/ICM s/p MDT BiV ICD generator change out 04/05/2015 by Dr. Caryl Comes, MD (original placed at [facility]), chronic combined CHF (NYHA class 2-3), LBBB, HTN, HLD, frequent falls and dizziness, and chronic ILD who presented to [facility] on 4/26 after developing slurred speech and right UE paresthesias lasting approximately 30 minutes. Cardiology is consulted for elevated troponin.  Last cardiac cath from 2014 showed mild to moderate proximal to mid LAD stenosis, moderate proximal and distal RCA disease estimated at 60% without change from prior cardiac cath in 2009, EF >55%. Medical management was advised. She last underwent ischemic evaluation via nuclear stress testing in 01/2015 that showed no significant ischemia or WMA, EF 19%. Follow up echo on 02/02/2015 showed an EF of 35-40%, GR1DD, mild to moderate AI, mild MR, PASP 65 mm hg. Prior echo from 2011 showed an EF of 43%, mildly dilated LA, mild pulmonary HTN at 33 mm Hg. She takes Lasix prn, though was initially started on this with dosing of every other day.   She has had issues with dizziness and falls lately. They have appeared to be with standing. BP has been documented as stable during those times with a SBP not dropping lower than 110 mm Hg. It was felt her falls and dizziness were related to "anxiety" upon standing. She was seen in the ED in March for UTI and near syncope. Head CT was negative. She has scheduled neurology follow up.   Patient has long standing issues with bilateral UE "coldness" and cramps. She felt like she  was getting a cramp with associated coldness on 4/26 and went to put on her usual gloves. However, upon trying to place the glove on her right hand she noticed her right hand was limp and numb up to the elbow. This was followed by the noticing of slurred speech that progressively worsened. She pushed her Medical Alert button and called EMS via the phone. Upon EMS arrival there was concern for TIA vs stroke. Her symptoms lasted approximately 30 minutes and resolved before EMS left her apartment. She has been symptom free since. She never had any chest pain, increased SOB, palpitations, nausea, vomiting, or diaphoresis. She does note a long standing cough that is not productive of sputum. Per family members this is her main concern.    Upon the patient's arrival to [facility] they were found to have a head CT with no acute process. Unable to have MRI given ICD. Troponin 0.13-->0.51-->0.58, SCr 1.13, WSR 77 mm/hr. ECG showed V paced rhythm, 83 bpm, CXR showed bilateral diffuse interstitial thickening superimposed on chronic ILD concerning for CHF. Carotid doppler with no hemodynamically significant stenosis bilaterally. She is scheduled for repeat head CT today. Upon her arrival her symptoms were resolved. She did not receive tPA.      Past Medical History  Diagnosis Date  . Nonischemic cardiomyopathy (HCC)     s/p Medtronic CRT-D, NYHA II-III at baseline  . Coronary artery disease     a. s/p prior stent-RCA b. borderline CAD on 04/2013 cath  . History of MI (myocardial infarction)   . Dyslipidemia   .  Hypertension   . COPD (chronic obstructive pulmonary disease) ([place])   . Hypothyroidism   . Basal cell carcinoma 2011  . Respiratory infection   . Vitamin D deficiency   . Breast cancer ([place]) 30+ yrs. ago    right breast, radiation      Most Recent Cardiac Studies: As above.   Surgical History:  Past Surgical History  Procedure Laterality Date  . Cardiac defibrillator placement    . Insert / replace  / remove pacemaker    . Mastectomy    . Abdominal hysterectomy    . Basal cell carcinoma excision      Monh's procedure  . Cardiac catheterization  2014  . Ep implantable device N/A 04/05/2015    Procedure: BIV ICD Generator Changeout;  Surgeon: Deboraha Sprang, MD;  Location: [place] [facility];  Service: Cardiovascular;  Laterality: N/A;  . Breast excisional biopsy Right 30+yrs ago    positive     Home Meds: Prior to Admission medications   Medication Sig Start Date End Date Taking? Authorizing Provider  albuterol (PROVENTIL HFA;VENTOLIN HFA) 108 (90 BASE) MCG/ACT inhaler Inhale 2 puffs into the lungs every 6 (six) hours as needed for wheezing or shortness of breath. 12/28/13  Yes Roger A Arguello, PA-C  Ascorbic Acid (VITAMIN C) 500 MG tablet Take 500 mg by mouth daily at 12 noon.    Yes Historical Provider, MD  carvedilol (COREG) 3.125 MG tablet Take 1 tablet (3.125 mg total) by mouth 2 (two) times daily. 01/23/16  Yes Minna Merritts, MD  furosemide (LASIX) 20 MG tablet Take 20 mg by mouth daily as needed for edema.   Yes Historical Provider, MD  levothyroxine (SYNTHROID, LEVOTHROID) 75 MCG tablet Take 75 mcg by mouth daily before breakfast.    Yes Historical Provider, MD  LORazepam (ATIVAN) 1 MG tablet Take 1 mg by mouth at bedtime as needed for anxiety or sleep.    Yes Historical Provider, MD  Multiple Vitamins-Minerals (MULTIVITAMIN GUMMIES ADULT PO) Take 1 each by mouth daily at 12 noon.    Yes Historical Provider, MD  pravastatin (PRAVACHOL) 10 MG tablet Take 10 mg by mouth at bedtime.    Yes Historical Provider, MD  vitamin B-12 (CYANOCOBALAMIN) 1000 MCG tablet Take 1,000 mcg by mouth daily at 12 noon.    Yes Historical Provider, MD    Inpatient Medications:  . carvedilol  3.125 mg Oral BID  . clopidogrel  75 mg Oral Daily  . enoxaparin (LOVENOX) injection  1 mg/kg Subcutaneous Q12H  . ipratropium-albuterol  3 mL Nebulization Q6H  . levothyroxine  75 mcg Oral QAC breakfast  .  multivitamin with minerals  1 tablet Oral Q1200  . pravastatin  10 mg Oral QHS  . vitamin B-12  1,000 mcg Oral Q1200  . vitamin C  500 mg Oral Q1200      Allergies:  Allergies  Allergen Reactions  . Ciprofloxacin Anaphylaxis and Hives  . Ace Inhibitors Other (See Comments)    Reaction:  Unknown   . Amlodipine-Atorvastatin Other (See Comments)    Reaction:  Unknown   . Amoxicillin Itching, Rash and Other (See Comments)    Has patient had a PCN reaction causing immediate rash, facial/tongue/throat swelling, SOB or lightheadedness with hypotension: No Has patient had a PCN reaction causing severe rash involving mucus membranes or skin necrosis: No Has patient had a PCN reaction that required hospitalization No Has patient had a PCN reaction occurring within the last 10 years: Yes  If all of the above answers are "NO", then may proceed with Cephalosporin use.  . Quinolones Itching and Rash    Social History   Social History  . Marital Status: Widowed    Spouse Name: N/A  . Number of Children: N/A  . Years of Education: N/A   Occupational History  . Not on file.   Social History Main Topics  . Smoking status: Former Research scientist (life sciences)  . Smokeless tobacco: Never Used  . Alcohol Use: No  . Drug Use: No  . Sexual Activity: Not on file   Other Topics Concern  . Not on file   Social History Narrative     Family History  Problem Relation Age of Onset  . Cancer Other   . Coronary artery disease Other   . Hypertension Other   . Hypertension Mother   . Stroke Mother   . Hypertension Father   . Stroke Father      Review of Systems: Review of Systems  Constitutional: Positive for weight loss and malaise/fatigue. Negative for fever, chills and diaphoresis.  HENT: Positive for hearing loss. Negative for congestion.   Eyes: Negative for blurred vision, double vision, photophobia, pain, discharge and redness.       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 Negative for hemoptysis, sputum production and wheezing.   Cardiovascular: Negative for chest pain, palpitations, orthopnea, claudication, leg swelling and PND.  Gastrointestinal: Negative for heartburn, nausea, vomiting and abdominal pain.  Musculoskeletal: Positive for falls. Negative for myalgias, back pain, joint pain and neck pain.  Skin: Negative for rash.  Neurological: Positive for dizziness, tingling, sensory change, speech change, focal weakness and weakness. Negative for tremors, seizures, loss of consciousness and headaches.  Endo/Heme/Allergies: Does not bruise/bleed easily.  Psychiatric/Behavioral: Negative for substance abuse. The patient is nervous/anxious.   All other systems reviewed and are negative.   Labs:  Recent Labs  03/12/16 1826 03/12/16 2201 03/13/16 0319 03/13/16 0713  TROPONINI 0.06* 0.13* 0.51* 0.58*   Lab Results  Component Value Date   WBC 6.1 03/13/2016   HGB 11.9* 03/13/2016   HCT 35.0 03/13/2016   MCV 88.8 03/13/2016   PLT 217 03/13/2016    Recent Labs Lab 03/13/16 0319  NA 136  K 4.0  CL 103  CO2 26  BUN 14  CREATININE 1.13*  CALCIUM 8.9  GLUCOSE 109*   Lab Results  Component Value Date   CHOL 149 03/13/2016   HDL 45 03/13/2016   LDLCALC 81 03/13/2016   TRIG 115 03/13/2016   No results found for: DDIMER  Radiology/Studies:  Dg Chest 2 View  03/12/2016  CLINICAL DATA:  Shortness of breath EXAM: CHEST  2 VIEW COMPARISON:  12/28/2013 FINDINGS: There is diffuse bilateral chronic interstitial thickening. There is superimposed more severe interstitial prominence most notably at the lung bases. There is no pneumothorax. There is no significant pleural effusion. There is stable cardiomegaly. There is an AICD noted. The osseous structures are unremarkable. IMPRESSION: 1. Bilateral diffuse interstitial thickening superimposed upon chronic interstitial lung disease. These findings are concerning for CHF.  Electronically Signed   By: Kathreen Devoid   On: 03/12/2016 17:22   Ct Head Wo Contrast  03/12/2016  CLINICAL DATA:  Right arm numbness this morning. Blurry vision for 1 week. EXAM: CT HEAD WITHOUT CONTRAST TECHNIQUE: Contiguous axial images were obtained from the base of the skull through the vertex without contrast. COMPARISON:  02/08/2016  FINDINGS: Stable low-density in the periventricular and subcortical white matter. No evidence for acute hemorrhage, mass lesion, midline shift, hydrocephalus or large infarct. Visualized paranasal sinuses are clear. No calvarial fracture. IMPRESSION: No acute intracranial abnormality. Stable white matter disease. Findings likely represent chronic small vessel ischemic changes. Electronically Signed   By: Markus Daft M.D.   On: 03/12/2016 15:26   US Carotid Bilateral  03/12/2016  CLINICAL DATA:  CVA. EXAM: BILATERAL CAROTID DUPLEX ULTRASOUND TECHNIQUE: Pearline Cables scale imaging, color Doppler and duplex ultrasound were performed of bilateral carotid and vertebral arteries in the neck. COMPARISON:  None. FINDINGS: Criteria: Quantification of carotid stenosis is based on velocity parameters that correlate the residual internal carotid diameter with NASCET-based stenosis levels, using the diameter of the distal internal carotid lumen as the denominator for stenosis measurement. The following velocity measurements were obtained: RIGHT ICA:  43 cm/sec CCA:  59 cm/sec SYSTOLIC ICA/CCA RATIO:  [id number] DIASTOLIC ICA/CCA RATIO:  1.1 ECA:  102 cm/sec LEFT ICA:  58 cm/sec CCA:  63 cm/sec SYSTOLIC ICA/CCA RATIO:  [id number] DIASTOLIC ICA/CCA RATIO:  1.4 ECA:  67 cm/sec RIGHT CAROTID ARTERY: No appreciable plaque in the right carotid bulb or bifurcation. No hemodynamically significant stenosis in the right internal carotid artery by Doppler criteria. RIGHT VERTEBRAL ARTERY:  Normal antegrade flow. LEFT CAROTID ARTERY: No appreciable plaque in the left carotid bulb and bifurcation. No hemodynamically  significant stenosis in the left internal carotid artery by Doppler criteria. LEFT VERTEBRAL ARTERY:  Normal antegrade flow. IMPRESSION: 1. No hemodynamically significant stenosis in either carotid (0- 49%). 2. Normal antegrade flow in both vertebral arteries. Electronically Signed   By: Ilona Sorrel M.D.   On: 03/12/2016 17:35    EKG: V paced rhythm, 83 bpm  Weights: Filed Weights   03/12/16 1301 03/12/16 1848  Weight: 141 lb (63.957 kg) 140 lb 3.2 oz (63.594 kg)     Physical Exam: Blood pressure 128/57, pulse 82, temperature 98.4 F (36.9 C), temperature source Oral, resp. rate 20, height 5\' 1"  (1.549 m), weight 140 lb 3.2 oz (63.594 kg), SpO2 96 %. Body mass index is 26.5 kg/(m^2). General: Well developed, well nourished, in no acute distress. HOH.  Head: Normocephalic, atraumatic, sclera non-icteric, no xanthomas, nares are without discharge.  Neck: Negative for carotid bruits. JVD not elevated. Lungs: Coarse breath sounds bilaterally possibly c/w ILD vs CHF, worse at the bilateral bases. Breathing is unlabored. Heart: RRR with S1 S2. No murmurs, rubs, or gallops appreciated. Abdomen: Soft, non-tender, non-distended with normoactive bowel sounds. No hepatomegaly. No rebound/guarding. No obvious abdominal masses. Msk:  Strength and tone appear normal for age. 5/5 strength throughout.  Extremities: No clubbing or cyanosis. No edema.  Distal pedal pulses are 2+ and equal bilaterally.  Neuro: Alert and oriented X 3. No facial asymmetry. No focal deficit. Moves all extremities spontaneously. Normal sensation throughout.  Psych:  Responds to questions appropriately with a normal affect.    Assessment and Plan:   1. Elevated troponin: -Never with any symptoms of angina -Possibly in the setting of her possible acute stroke with associated slurred speech and right UE numbness -Check echo to evaluate LV systolic function and wall motion, if c/w with most recent echo as above could hold off  on any further inpatient ischemic evaluation -Nonetheless, an elevated troponin in the setting of acute CVA does offer a decreased prognosis in the months to follow -Could pursue outpatient ischemic work up  2. TIA vs possible acute stroke: -Head CT upon arrival  negative -Unable to have MRI 2/2 BiV ICD -Scheduled for repeat head CT today -Consider neurology consult if felt indicated  -No symptoms of palpitations, though with patient's left eye blindness of unknown duration and the above possible TIA/CVA would interrogate ICD to assess cardiac rhythm -It looks like Plavix was started upon admission, not previously on antiplatelets as an outpatient  -Could ask neurology about treatment moving forward  3. CAD s/p remote stenting as above: -No symptoms of angina as above -Plavix started upon admission -Coreg 3.125 mg bid -Pravastatin, intolerant to high-intensity statins -Echo as above  4. Acute on chronic combined CHF/pulmonary HTN/mixed NICM/ICM s/p MDT BiV ICD: -CXR with possible CHF exacerbation  -She only takes her Lasix prn -BNP added and came back elevated at 557 -Check echo as above to assess LV function, wall motion, and right sided pressure (prior of 65 mmHg in 01/2015). -Continue Coreg -Change prn dosing of Lasix to scheduled IV Lasix 20 mg bid with KCl repletion   5. Frequent falls and dizziness: -PT to see -If echo is c/w prior ok to work  6. ILD: -Stable  Signed, Christell Faith, PA-C Pager: [phone] 03/13/2016, 11:11 AM

## 2016-03-13 NOTE — Progress Notes (Signed)
Speech Therapy Note: received order, reviewed chart and consulted NSG then met w/ pt and Dtr in room. Pt sitting in chair, verbally conversive and A/Ox3. During conversation, pt was bothered by a dry cough which appeared to impact her breath support for speech intermittently. Dtr stated "it is like this after breathing treatments even at home". Encouraged pt and Dtr to f/u w/ the MD about this if they wish. Pt and Dtr stated her speech was "fine now" and that it was her baseline speech. Pt and Dtr denied any difficulty w/ swallowing; stated she tolerated her breakfast meal appropriately. Upon drinking water via water jug during this screening, no overt s/s of aspiration were noted.  No skilled ST needs indicated at this time. NSG to reconsult if any change in status or questions. Pt  And Dtr agreed.

## 2016-03-13 NOTE — Progress Notes (Signed)
Patient admitted with slightly elevated troponin level at 0.06 with recheck Q4.  22:00 troponin draw resulted at .13.  Dr. Melynda Ripple paged with order to continue Q4 troponin checks.  03:00 a.m. Troponin draw resulted at .51.  Paged Dr. Estanislado Pandy who ordered STAT EKG with full dose of Lovenox ordered.

## 2016-03-13 NOTE — Progress Notes (Signed)
[place] [facility] [place] NAME: Erica [name]    MR#:  [id number]  DATE OF BIRTH:  12/12/1930  SUBJECTIVE:  CHIEF COMPLAINT:   Chief Complaint  Patient presents with  . Weakness   - Patient admitted with slurred speech and right hand numbness and weakness yesterday. Symptoms lasted for 30 minutes and resolved. -CT head negative for any acute findings. Cannot get MRI due to pacemaker and AICD. -Troponins elevated. Patient is asymptomatic today.  REVIEW OF SYSTEMS:  Review of Systems  Constitutional: Negative for fever, chills and malaise/fatigue.  HENT: Positive for hearing loss. Negative for congestion, ear discharge and nosebleeds.   Eyes: Positive for blurred vision. Negative for photophobia and pain.  Respiratory: Negative for cough, shortness of breath and wheezing.   Cardiovascular: Negative for chest pain, palpitations and leg swelling.  Gastrointestinal: Negative for nausea, vomiting, abdominal pain, diarrhea and constipation.  Genitourinary: Negative for dysuria.  Musculoskeletal: Positive for falls. Negative for myalgias and neck pain.  Neurological: Positive for speech change and focal weakness. Negative for dizziness, seizures and headaches.  Psychiatric/Behavioral: Negative for depression.    DRUG ALLERGIES:   Allergies  Allergen Reactions  . Ciprofloxacin Anaphylaxis and Hives  . Ace Inhibitors Other (See Comments)    Reaction:  Unknown   . Amlodipine-Atorvastatin Other (See Comments)    Reaction:  Unknown   . Amoxicillin Itching, Rash and Other (See Comments)    Has patient had a PCN reaction causing immediate rash, facial/tongue/throat swelling, SOB or lightheadedness with hypotension: No Has patient had a PCN reaction causing severe rash involving mucus membranes or skin necrosis: No Has patient had a PCN reaction that required hospitalization No Has patient had a PCN reaction occurring within the last 10 years:  Yes If all of the above answers are "NO", then may proceed with Cephalosporin use.  . Quinolones Itching and Rash    VITALS:  Blood pressure 128/57, pulse 82, temperature 98.4 F (36.9 C), temperature source Oral, resp. rate 20, height 5\' 1"  (1.549 m), weight 63.594 kg (140 lb 3.2 oz), SpO2 96 %.  PHYSICAL EXAMINATION:  Physical Exam  GENERAL:  82 y.o.-year-old patient lying in the bed with no acute distress.  EYES: Pupils equal, round, reactive to light and accommodation. No scleral icterus. Extraocular muscles intact.  HEENT: Head atraumatic, normocephalic. Oropharynx and nasopharynx clear.  NECK:  Supple, no jugular venous distention. No thyroid enlargement, no tenderness.  LUNGS: Normal breath sounds bilaterally, no wheezing, rales,rhonchi or crepitation. No use of accessory muscles of respiration. Fine bibasilar crackles at the bases. CARDIOVASCULAR: S1, S2 normal. No rubs, or gallops. 3/6 systolic murmur is present. ABDOMEN: Soft, nontender, nondistended. Bowel sounds present. No organomegaly or mass.  EXTREMITIES: No pedal edema, cyanosis, or clubbing.  NEUROLOGIC: Cranial nerves II through XII are intact except decreased visual acuity in left eye. Muscle strength 5/5 in all extremities. Sensation intact. Gait not checked.  PSYCHIATRIC: The patient is alert and oriented x 3.  SKIN: No obvious rash, lesion, or ulcer.    LABORATORY PANEL:   CBC  Recent Labs Lab 03/13/16 0319  WBC 6.1  HGB 11.9*  HCT 35.0  PLT 217   ------------------------------------------------------------------------------------------------------------------  Chemistries   Recent Labs Lab 03/13/16 0319  NA 136  K 4.0  CL 103  CO2 26  GLUCOSE 109*  BUN 14  CREATININE 1.13*  CALCIUM 8.9   ------------------------------------------------------------------------------------------------------------------  Cardiac Enzymes  Recent Labs Lab 03/13/16 1056  TROPONINI  0.59*    ------------------------------------------------------------------------------------------------------------------  RADIOLOGY:  Dg Chest 2 View  03/12/2016  CLINICAL DATA:  Shortness of breath EXAM: CHEST  2 VIEW COMPARISON:  12/28/2013 FINDINGS: There is diffuse bilateral chronic interstitial thickening. There is superimposed more severe interstitial prominence most notably at the lung bases. There is no pneumothorax. There is no significant pleural effusion. There is stable cardiomegaly. There is an AICD noted. The osseous structures are unremarkable. IMPRESSION: 1. Bilateral diffuse interstitial thickening superimposed upon chronic interstitial lung disease. These findings are concerning for CHF. Electronically Signed   By: Kathreen Devoid   On: 03/12/2016 17:22   Ct Head Wo Contrast  03/12/2016  CLINICAL DATA:  Right arm numbness this morning. Blurry vision for 1 week. EXAM: CT HEAD WITHOUT CONTRAST TECHNIQUE: Contiguous axial images were obtained from the base of the skull through the vertex without contrast. COMPARISON:  02/08/2016 FINDINGS: Stable low-density in the periventricular and subcortical white matter. No evidence for acute hemorrhage, mass lesion, midline shift, hydrocephalus or large infarct. Visualized paranasal sinuses are clear. No calvarial fracture. IMPRESSION: No acute intracranial abnormality. Stable white matter disease. Findings likely represent chronic small vessel ischemic changes. Electronically Signed   By: Markus Daft M.D.   On: 03/12/2016 15:26   US Carotid Bilateral  03/12/2016  CLINICAL DATA:  CVA. EXAM: BILATERAL CAROTID DUPLEX ULTRASOUND TECHNIQUE: Pearline Cables scale imaging, color Doppler and duplex ultrasound were performed of bilateral carotid and vertebral arteries in the neck. COMPARISON:  None. FINDINGS: Criteria: Quantification of carotid stenosis is based on velocity parameters that correlate the residual internal carotid diameter with NASCET-based stenosis levels,  using the diameter of the distal internal carotid lumen as the denominator for stenosis measurement. The following velocity measurements were obtained: RIGHT ICA:  43 cm/sec CCA:  59 cm/sec SYSTOLIC ICA/CCA RATIO:  [id number] DIASTOLIC ICA/CCA RATIO:  1.1 ECA:  102 cm/sec LEFT ICA:  58 cm/sec CCA:  63 cm/sec SYSTOLIC ICA/CCA RATIO:  [id number] DIASTOLIC ICA/CCA RATIO:  1.4 ECA:  67 cm/sec RIGHT CAROTID ARTERY: No appreciable plaque in the right carotid bulb or bifurcation. No hemodynamically significant stenosis in the right internal carotid artery by Doppler criteria. RIGHT VERTEBRAL ARTERY:  Normal antegrade flow. LEFT CAROTID ARTERY: No appreciable plaque in the left carotid bulb and bifurcation. No hemodynamically significant stenosis in the left internal carotid artery by Doppler criteria. LEFT VERTEBRAL ARTERY:  Normal antegrade flow. IMPRESSION: 1. No hemodynamically significant stenosis in either carotid (0- 49%). 2. Normal antegrade flow in both vertebral arteries. Electronically Signed   By: Ilona Sorrel M.D.   On: 03/12/2016 17:35    EKG:   Orders placed or performed during the hospital encounter of 03/12/16  . ED EKG  . ED EKG  . EKG 12-Lead  . EKG 12-Lead    ASSESSMENT AND PLAN:   82 year old female with past medical history significant for CAD status post ends, non-ischemic cardiomyopathy status post AICD, chronic combined congestive heart failure with last known ejection fraction of 35-40%, hypertension, interstitial lung disease presents to the hospital secondary to slurred speech and right upper extremity weakness which resolved.  #1 TIA-resolved symptoms of slurred speech and right upper extremity weakness. -CT of the head with no acute findings. Chronic small vessel ischemic changes noted. Cannot get MRI done due to defibrillator. -Repeat CT head today. Was on aspirin at home-changed to Plavix since admission. -LDL is 81. On low-dose statin -Carotid Dopplers with no significant stenosis.  Echo is pending -Physical therapy and occupational therapy consult pending  #  2 elevated troponin-less likely to be NSTEMI. Demand ischemia from neurological event versus worsening CHF. -Appreciate cardiology consult. Echocardiogram is pending. -Change Lasix to oral. On Plavix, Coreg and statin. -Patient is allergic to ace inhibitors. Consider adding an ARB if EF is low -Change Lovenox to prophylaxis dose  #3 dyspnea-symptomatic dyspnea with underlying CHF and poor ejection fraction. -Cardiac rehabilitation recommended.  #4 nonischemic cardiomyopathy with combined chronic heart failure-status post biventricular ICD. -Lasix changed to IV twice a day. -Follow-up echocardiogram. Appreciate cardiology consult. -On Plavix, Coreg and statin.  #5 frequent falls and dizziness-physical therapy consult pending. -Might benefit from assisted living.  #6 DVT prophylaxis-` on Lovenox   All the records are reviewed and case discussed with Care Management/Social Workerr. Management plans discussed with the patient, family and they are in agreement.  CODE STATUS: Full code  TOTAL TIME TAKING CARE OF THIS PATIENT: 37 minutes.   POSSIBLE D/C TODAY OR TOMORROW, DEPENDING ON CLINICAL CONDITION.   Gladstone Lighter M.D on 03/13/2016 at 12:38 PM  Between 7am to 6pm - Pager - [phone]  After 6pm go to [email or web address] - password [email or web address] [facility]  Office  [phone]  CC: Primary care physician; Kirk Ruths., MD

## 2016-03-13 NOTE — Progress Notes (Signed)
PT Cancellation Note  Patient Details Name: Erica [name] MRN: [id number] DOB: 1931/06/02   Cancelled Treatment:    Reason Eval/Treat Not Completed: Medical issues which prohibited therapy. Pt's chart reviewed. Currently pt's troponin levels are up trending and at 0.58. Echocardiogram and carotid ultrasound results pending. PT will follow up at a later time and complete evaluation with appropriate.   Neoma Laming, PT, DPT  03/13/2016, 9:21 AM [phone]

## 2016-03-13 NOTE — Care Management (Signed)
Presented to this facility with the diagnosis of CVA, Lives with daughter, [name], x 10 years [phone]). Last seen Dr. Ouida Sills on Tuesday. Followed by [facility] for nursing. No skilled facility. No home oxygen. Lives in a handicapped accessible apartment (in daughter's home,) Uses a rolling walker to aid in ambulation. Life Alert in the home. Takes care of all basic activities of daily living herself, but getting harder to accomplish these goals. Gave up driving about a month ago. Good appetite. Fallen many times in the bathroom. Daughter states that they having discussing assisted living, but mother is not open to this idea.  Will give list of available assisted living facilities in [facility].   Daughter is at the bedside. Shelbie Ammons RN MSN CCM Care Management [phone]

## 2016-03-14 DIAGNOSIS — I5022 Chronic systolic (congestive) heart failure: Secondary | ICD-10-CM

## 2016-03-14 DIAGNOSIS — R296 Repeated falls: Secondary | ICD-10-CM | POA: Insufficient documentation

## 2016-03-14 DIAGNOSIS — W19XXXA Unspecified fall, initial encounter: Secondary | ICD-10-CM | POA: Insufficient documentation

## 2016-03-14 DIAGNOSIS — R748 Abnormal levels of other serum enzymes: Secondary | ICD-10-CM | POA: Diagnosis not present

## 2016-03-14 DIAGNOSIS — G459 Transient cerebral ischemic attack, unspecified: Secondary | ICD-10-CM | POA: Insufficient documentation

## 2016-03-14 DIAGNOSIS — Z8673 Personal history of transient ischemic attack (TIA), and cerebral infarction without residual deficits: Secondary | ICD-10-CM | POA: Diagnosis not present

## 2016-03-14 DIAGNOSIS — Z85820 Personal history of malignant melanoma of skin: Secondary | ICD-10-CM | POA: Diagnosis not present

## 2016-03-14 DIAGNOSIS — Z95810 Presence of automatic (implantable) cardiac defibrillator: Secondary | ICD-10-CM | POA: Diagnosis not present

## 2016-03-14 DIAGNOSIS — J449 Chronic obstructive pulmonary disease, unspecified: Secondary | ICD-10-CM | POA: Diagnosis not present

## 2016-03-14 DIAGNOSIS — I2510 Atherosclerotic heart disease of native coronary artery without angina pectoris: Secondary | ICD-10-CM | POA: Diagnosis not present

## 2016-03-14 DIAGNOSIS — F411 Generalized anxiety disorder: Secondary | ICD-10-CM | POA: Diagnosis not present

## 2016-03-14 DIAGNOSIS — I252 Old myocardial infarction: Secondary | ICD-10-CM | POA: Diagnosis not present

## 2016-03-14 DIAGNOSIS — Z9181 History of falling: Secondary | ICD-10-CM | POA: Diagnosis not present

## 2016-03-14 DIAGNOSIS — Z853 Personal history of malignant neoplasm of breast: Secondary | ICD-10-CM | POA: Diagnosis not present

## 2016-03-14 DIAGNOSIS — I951 Orthostatic hypotension: Secondary | ICD-10-CM | POA: Diagnosis not present

## 2016-03-14 DIAGNOSIS — E039 Hypothyroidism, unspecified: Secondary | ICD-10-CM | POA: Diagnosis not present

## 2016-03-14 DIAGNOSIS — E785 Hyperlipidemia, unspecified: Secondary | ICD-10-CM | POA: Diagnosis not present

## 2016-03-14 DIAGNOSIS — I639 Cerebral infarction, unspecified: Secondary | ICD-10-CM

## 2016-03-14 DIAGNOSIS — Z7982 Long term (current) use of aspirin: Secondary | ICD-10-CM | POA: Diagnosis not present

## 2016-03-14 DIAGNOSIS — R7989 Other specified abnormal findings of blood chemistry: Secondary | ICD-10-CM | POA: Diagnosis not present

## 2016-03-14 DIAGNOSIS — I429 Cardiomyopathy, unspecified: Secondary | ICD-10-CM | POA: Diagnosis not present

## 2016-03-14 DIAGNOSIS — R29898 Other symptoms and signs involving the musculoskeletal system: Secondary | ICD-10-CM

## 2016-03-14 DIAGNOSIS — I10 Essential (primary) hypertension: Secondary | ICD-10-CM | POA: Diagnosis not present

## 2016-03-14 DIAGNOSIS — W19XXXS Unspecified fall, sequela: Secondary | ICD-10-CM

## 2016-03-14 DIAGNOSIS — Z7902 Long term (current) use of antithrombotics/antiplatelets: Secondary | ICD-10-CM | POA: Diagnosis not present

## 2016-03-14 DIAGNOSIS — I5042 Chronic combined systolic (congestive) and diastolic (congestive) heart failure: Secondary | ICD-10-CM | POA: Diagnosis not present

## 2016-03-14 LAB — BASIC METABOLIC PANEL
Anion gap: 7 (ref 5–15)
BUN: 18 mg/dL (ref 6–20)
CALCIUM: 9.1 mg/dL (ref 8.9–10.3)
CHLORIDE: 101 mmol/L (ref 101–111)
CO2: 27 mmol/L (ref 22–32)
CREATININE: 1.25 mg/dL — AB (ref 0.44–1.00)
GFR calc Af Amer: 45 mL/min — ABNORMAL LOW (ref >60)
GFR calc non Af Amer: 39 mL/min — ABNORMAL LOW (ref >60)
GLUCOSE: 106 mg/dL — AB (ref 65–99)
Potassium: 4.3 mmol/L (ref 3.5–5.1)
Sodium: 135 mmol/L (ref 135–145)

## 2016-03-14 MED ORDER — DOCUSATE SODIUM 100 MG PO CAPS
100.0000 mg | ORAL_CAPSULE | Freq: Two times a day (BID) | ORAL | Status: DC
  Administered 2016-03-14: 100 mg via ORAL
  Filled 2016-03-14: qty 1

## 2016-03-14 MED ORDER — ASPIRIN 81 MG PO CHEW: 81.0000 mg | CHEWABLE_TABLET | Freq: Every day | ORAL | Status: DC

## 2016-03-14 MED ORDER — SODIUM CHLORIDE 0.9 % IV BOLUS (SEPSIS)
250.0000 mL | Freq: Once | INTRAVENOUS | Status: AC
Start: 2016-03-14 — End: 2016-03-14
  Administered 2016-03-14: 250 mL via INTRAVENOUS

## 2016-03-14 MED ORDER — FLUTICASONE-SALMETEROL 250-50 MCG/DOSE IN AEPB: 1.0000 | INHALATION_SPRAY | Freq: Two times a day (BID) | RESPIRATORY_TRACT | Status: DC

## 2016-03-14 MED ORDER — DOCUSATE SODIUM 100 MG PO CAPS: 100.0000 mg | ORAL_CAPSULE | Freq: Two times a day (BID) | ORAL | Status: DC

## 2016-03-14 MED ORDER — FUROSEMIDE 20 MG PO TABS
20.0000 mg | ORAL_TABLET | Freq: Every day | ORAL | Status: DC
  Administered 2016-03-14: 20 mg via ORAL
  Filled 2016-03-14: qty 1

## 2016-03-14 MED ORDER — MOMETASONE FURO-FORMOTEROL FUM 200-5 MCG/ACT IN AERO
2.0000 | INHALATION_SPRAY | Freq: Two times a day (BID) | RESPIRATORY_TRACT | Status: DC
  Filled 2016-03-14: qty 8.8

## 2016-03-14 MED ORDER — LORAZEPAM 1 MG PO TABS: 1.0000 mg | ORAL_TABLET | Freq: Every evening | ORAL | Status: DC | PRN

## 2016-03-14 MED ORDER — CLOPIDOGREL BISULFATE 75 MG PO TABS: 75.0000 mg | ORAL_TABLET | Freq: Every day | ORAL | Status: DC

## 2016-03-14 MED ORDER — MIDODRINE HCL 5 MG PO TABS: 5.0000 mg | ORAL_TABLET | Freq: Every day | ORAL | Status: DC | PRN

## 2016-03-14 MED ORDER — ASPIRIN 81 MG PO CHEW
81.0000 mg | CHEWABLE_TABLET | Freq: Every day | ORAL | Status: DC
  Administered 2016-03-14: 11:00:00 81 mg via ORAL
  Filled 2016-03-14: qty 1

## 2016-03-14 MED ORDER — SODIUM CHLORIDE 0.9 % IV BOLUS (SEPSIS)
500.0000 mL | Freq: Once | INTRAVENOUS | Status: AC
  Administered 2016-03-14: 15:00:00 500 mL via INTRAVENOUS

## 2016-03-14 MED ORDER — BENZONATATE 100 MG PO CAPS: 100.0000 mg | ORAL_CAPSULE | Freq: Three times a day (TID) | ORAL | Status: DC

## 2016-03-14 MED ORDER — ENOXAPARIN SODIUM 30 MG/0.3ML ~~LOC~~ SOLN: 30.0000 mg | SUBCUTANEOUS | Status: DC

## 2016-03-14 NOTE — Progress Notes (Addendum)
Pt is being discharged to [facility]. Pt's daughter will transport pt. Report given to [place], Therapist, sports. Discharge packet given to pt's daughter and instructed to give it to RN at the facility. Pt's daughter verbalized understanding.

## 2016-03-14 NOTE — Progress Notes (Addendum)
[place] at [place] NAME: Erica [name]    MR#:  [id number]  DATE OF BIRTH:  Nov 27, 1930  SUBJECTIVE:  CHIEF COMPLAINT:   Chief Complaint  Patient presents with  . Weakness   -No further Neurological changes. Stable overnight. -Troponins plateaued. Complaints of decreased left eye vision and also generalized fatigue going on for a few weeks prior to admission. -Pending physical therapy consult today. Possible discharge today.  REVIEW OF SYSTEMS:  Review of Systems  Constitutional: Negative for fever, chills and malaise/fatigue.  HENT: Positive for hearing loss. Negative for congestion, ear discharge and nosebleeds.   Eyes: Positive for blurred vision. Negative for photophobia and pain.  Respiratory: Negative for cough, shortness of breath and wheezing.   Cardiovascular: Negative for chest pain, palpitations and leg swelling.  Gastrointestinal: Negative for nausea, vomiting, abdominal pain, diarrhea and constipation.  Genitourinary: Negative for dysuria.  Musculoskeletal: Positive for falls. Negative for myalgias and neck pain.  Neurological: Positive for speech change and focal weakness. Negative for dizziness, seizures and headaches.  Psychiatric/Behavioral: Negative for depression.    DRUG ALLERGIES:   Allergies  Allergen Reactions  . Ciprofloxacin Anaphylaxis and Hives  . Ace Inhibitors Other (See Comments)    Reaction:  Unknown   . Amlodipine-Atorvastatin Other (See Comments)    Reaction:  Unknown   . Amoxicillin Itching, Rash and Other (See Comments)    Has patient had a PCN reaction causing immediate rash, facial/tongue/throat swelling, SOB or lightheadedness with hypotension: No Has patient had a PCN reaction causing severe rash involving mucus membranes or skin necrosis: No Has patient had a PCN reaction that required hospitalization No Has patient had a PCN reaction occurring within the last 10 years: Yes If all  of the above answers are "NO", then may proceed with Cephalosporin use.  . Quinolones Itching and Rash    VITALS:  Blood pressure 124/62, pulse 85, temperature 98.2 F (36.8 C), temperature source Oral, resp. rate 18, height 5\' 1"  (1.549 m), weight 63.594 kg (140 lb 3.2 oz), SpO2 95 %.  PHYSICAL EXAMINATION:  Physical Exam  GENERAL:  82 y.o.-year-old patient lying in the bed with no acute distress. Hard of hearing. EYES: Pupils equal, round, reactive to light and accommodation. No scleral icterus. Extraocular muscles intact.  HEENT: Head atraumatic, normocephalic. Oropharynx and nasopharynx clear.  NECK:  Supple, no jugular venous distention. No thyroid enlargement, no tenderness.  LUNGS: Normal breath sounds bilaterally, no wheezing, rales,rhonchi or crepitation. No use of accessory muscles of respiration. Fine bibasilar crackles at the bases. CARDIOVASCULAR: S1, S2 normal. No rubs, or gallops. 3/6 systolic murmur is present. ABDOMEN: Soft, nontender, nondistended. Bowel sounds present. No organomegaly or mass.  EXTREMITIES: No pedal edema, cyanosis, or clubbing.  NEUROLOGIC: Cranial nerves II through XII are intact except decreased visual acuity in left eye. Muscle strength 5/5 in all extremities. Sensation intact. Gait not checked.  PSYCHIATRIC: The patient is alert and oriented x 3.  SKIN: No obvious rash, lesion, or ulcer.    LABORATORY PANEL:   CBC  Recent Labs Lab 03/13/16 0319  WBC 6.1  HGB 11.9*  HCT 35.0  PLT 217   ------------------------------------------------------------------------------------------------------------------  Chemistries   Recent Labs Lab 03/13/16 0319  NA 136  K 4.0  CL 103  CO2 26  GLUCOSE 109*  BUN 14  CREATININE 1.13*  CALCIUM 8.9   ------------------------------------------------------------------------------------------------------------------  Cardiac Enzymes  Recent Labs Lab 03/13/16 2006  TROPONINI 0.37*    ------------------------------------------------------------------------------------------------------------------  RADIOLOGY:  Dg Chest 2 View  03/12/2016  CLINICAL DATA:  Shortness of breath EXAM: CHEST  2 VIEW COMPARISON:  12/28/2013 FINDINGS: There is diffuse bilateral chronic interstitial thickening. There is superimposed more severe interstitial prominence most notably at the lung bases. There is no pneumothorax. There is no significant pleural effusion. There is stable cardiomegaly. There is an AICD noted. The osseous structures are unremarkable. IMPRESSION: 1. Bilateral diffuse interstitial thickening superimposed upon chronic interstitial lung disease. These findings are concerning for CHF. Electronically Signed   By: Kathreen Devoid   On: 03/12/2016 17:22   Ct Head Wo Contrast  03/13/2016  CLINICAL DATA:  Stroke.  Slurred speech and right arm numbness EXAM: CT HEAD WITHOUT CONTRAST TECHNIQUE: Contiguous axial images were obtained from the base of the skull through the vertex without intravenous contrast. COMPARISON:  CT head 03/12/2016 FINDINGS: Generalized atrophy. Hypodensity throughout the cerebral white matter is stable from yesterday and consistent with chronic microvascular ischemia. Negative for acute infarct. Negative for intracranial hemorrhage. Negative for mass or edema. Negative calvarium. IMPRESSION: Atrophy and chronic microvascular ischemic changes. No acute abnormality. Electronically Signed   By: Franchot Gallo M.D.   On: 03/13/2016 16:35   Ct Head Wo Contrast  03/12/2016  CLINICAL DATA:  Right arm numbness this morning. Blurry vision for 1 week. EXAM: CT HEAD WITHOUT CONTRAST TECHNIQUE: Contiguous axial images were obtained from the base of the skull through the vertex without contrast. COMPARISON:  02/08/2016 FINDINGS: Stable low-density in the periventricular and subcortical white matter. No evidence for acute hemorrhage, mass lesion, midline shift, hydrocephalus or large  infarct. Visualized paranasal sinuses are clear. No calvarial fracture. IMPRESSION: No acute intracranial abnormality. Stable white matter disease. Findings likely represent chronic small vessel ischemic changes. Electronically Signed   By: Markus Daft M.D.   On: 03/12/2016 15:26   US Carotid Bilateral  03/12/2016  CLINICAL DATA:  CVA. EXAM: BILATERAL CAROTID DUPLEX ULTRASOUND TECHNIQUE: Pearline Cables scale imaging, color Doppler and duplex ultrasound were performed of bilateral carotid and vertebral arteries in the neck. COMPARISON:  None. FINDINGS: Criteria: Quantification of carotid stenosis is based on velocity parameters that correlate the residual internal carotid diameter with NASCET-based stenosis levels, using the diameter of the distal internal carotid lumen as the denominator for stenosis measurement. The following velocity measurements were obtained: RIGHT ICA:  43 cm/sec CCA:  59 cm/sec SYSTOLIC ICA/CCA RATIO:  [id number] DIASTOLIC ICA/CCA RATIO:  1.1 ECA:  102 cm/sec LEFT ICA:  58 cm/sec CCA:  63 cm/sec SYSTOLIC ICA/CCA RATIO:  99991111 DIASTOLIC ICA/CCA RATIO:  1.4 ECA:  67 cm/sec RIGHT CAROTID ARTERY: No appreciable plaque in the right carotid bulb or bifurcation. No hemodynamically significant stenosis in the right internal carotid artery by Doppler criteria. RIGHT VERTEBRAL ARTERY:  Normal antegrade flow. LEFT CAROTID ARTERY: No appreciable plaque in the left carotid bulb and bifurcation. No hemodynamically significant stenosis in the left internal carotid artery by Doppler criteria. LEFT VERTEBRAL ARTERY:  Normal antegrade flow. IMPRESSION: 1. No hemodynamically significant stenosis in either carotid (0- 49%). 2. Normal antegrade flow in both vertebral arteries. Electronically Signed   By: Ilona Sorrel M.D.   On: 03/12/2016 17:35    EKG:   Orders placed or performed during the hospital encounter of 03/12/16  . ED EKG  . ED EKG  . EKG 12-Lead  . EKG 12-Lead    ASSESSMENT AND PLAN:   82 year old female  with past medical history significant for CAD status post ends, non-ischemic cardiomyopathy status post  AICD, chronic combined congestive heart failure with last known ejection fraction of 35-40%, hypertension, interstitial lung disease presents to the hospital secondary to slurred speech and right upper extremity weakness which resolved.  #1 TIA-resolved symptoms of slurred speech and right upper extremity weakness. -CT of the head with no acute findings. Chronic small vessel ischemic changes noted. Cannot get MRI done due to defibrillator. -Repeat CT head with no acute findings. Was on aspirin at home-changed to Plavix since admission. -LDL is 81. On low-dose statin -Carotid Dopplers with no significant stenosis. Echo with no source for CVA/TIA -Physical therapy and occupational therapy consult pending  #2 elevated troponin-Demand ischemia from neurological event. -Appreciate cardiology consult. Echocardiogram with no wall motion abnormality. EF 30-35%. No angina like symptoms. -On Plavix, Coreg and statin. -Patient is allergic to ace inhibitors. Consider adding an ARB as outpatient  #3 dyspnea-symptomatic dyspnea with underlying CHF and poor ejection fraction. -Cardiac rehabilitation recommended. -Sats are stable on room air.  #4 nonischemic cardiomyopathy with combined chronic heart failure-status post biventricular ICD. -Change Lasix to oral, every day -Echocardiogram with EF 30-35%. No wall motion abnormalities.Marland Kitchen Appreciate cardiology consult. -On Plavix, Coreg and statin.  #5 frequent falls and dizziness-due to generalized fatigue and also decreased visual acuity. physical therapy consult pending. -Might benefit from assisted living.  #6 DVT prophylaxis-` on Lovenox   Possible discharge today after physical therapy evaluation Patient will benefit from skilled rehabilitation facility. Has weakness, gait issues and orthostatic changes. Consult Education officer, museum.   All the records  are reviewed and case discussed with Care Management/Social Workerr. Management plans discussed with the patient, family and they are in agreement.  CODE STATUS: Full code  TOTAL TIME TAKING CARE OF THIS PATIENT: 38 minutes.   POSSIBLE D/C TODAY, DEPENDING ON CLINICAL CONDITION.   Gladstone Lighter M.D on 03/14/2016 at 7:19 AM  Between 7am to 6pm - Pager - [phone]  After 6pm go to [email or web address] - password [email or web address] [facility]  Office  [phone]  CC: Primary care physician; Kirk Ruths., MD

## 2016-03-14 NOTE — Clinical Social Work Note (Signed)
Clinical Social Work Assessment  Patient Details  Name: Erica [name] MRN: [id number] Date of Birth: 02/22/1931  Date of referral:  03/14/16               Reason for consult:  Facility Placement                Permission sought to share information with:  Family Supports Permission granted to share information::  Yes, Verbal Permission Granted  Name::     [name], dughter   Housing/Transportation Living arrangements for the past 2 months:  Single Family Home Source of Information:  Patient, Adult Children Patient Interpreter Needed:  None Criminal Activity/Legal Involvement Pertinent to Current Situation/Hospitalization:  No - Comment as needed Significant Relationships:  Adult Children Lives with:  Adult Children Do you feel safe going back to the place where you live?  No Need for family participation in patient care:   Yes  Care giving concerns:  Pt may be unsafe to return home at discharge.   Social Worker assessment / plan:  CSW met with pt and family to address consult for placement. CSW introduced herself and explained role of social work. PT is recommending HHPT/24 hour supervision. CSW explained SNF placement and ALF placement. CSW encouraged pt's daughter to apply for special assistance. CSW inquired to insurance company reguarding STR approval due to hypotension. STR approval was obtained. SNF search initiated and bed offers provided. Pt and family chose [facility]. RN to call report and pt's duaghter will provide transportation. CSW is signing off a no further needs identified.   Employment status:  Retired Nurse, adult, Medicaid In [place] PT Recommendations:  Home with [place], [place] / Referral to community resources:  [place], Other (Comment Required) (ALF and Medicaid)  Patient/Family's Response to care:  Pt and family were appreciative of CSW support.   Patient/Family's Understanding of and  Emotional Response to Diagnosis, Current Treatment, and Prognosis:  Pt's daughter feels that pt needs a higher level of care.   Emotional Assessment Appearance:  Appears stated age Attitude/Demeanor/Rapport:  Other (Appropriate) Affect (typically observed):  Pleasant Orientation:  Oriented to Self, Oriented to Place, Oriented to  Time, Oriented to Situation Alcohol / Substance use:  Never Used Psych involvement (Current and /or in the community):  No (Comment)  Discharge Needs  Concerns to be addressed:  Adjustment to Illness Readmission within the last 30 days:  No Current discharge risk:  Chronically ill Barriers to Discharge:  No Barriers Identified   Darden Dates, LCSW 03/14/2016, 4:03 PM

## 2016-03-14 NOTE — NC FL2 (Signed)
[place] LEVEL OF CARE SCREENING TOOL     IDENTIFICATION  Patient Name: Erica [name] Birthdate: [birth year] Sex: female Admission Date (Current Location): 03/12/2016  [place] and [place] Number:  Engineering geologist and Address:  [facility], [address], [place], [place] [address]      Provider Number: [id number]  Attending Physician Name and Address:  Gladstone Lighter, MD  Relative Name and Phone Number:       Current Level of Care: Hospital Recommended Level of Care: [place] Prior Approval Number:    Date Approved/Denied:   PASRR Number:    Discharge Plan: SNF    Current Diagnoses: Patient Active Problem List   Diagnosis Date Noted  . TIA (transient ischemic attack)   . Chronic systolic CHF (congestive heart failure) ([place])   . Falls   . Leg weakness, bilateral   . Elevated troponin   . CVA (cerebral infarction) 03/12/2016  . Gait instability 01/08/2016  . Chronic systolic heart failure ([place]) 02/02/2015  . Cough 01/18/2014  . Weakness generalized 01/18/2014  . Acute bronchitis 12/28/2013  . Nonischemic cardiomyopathy ([place]) 12/28/2013  . Orthostatic hypotension 04/06/2012  . Biventricular implantable cardioverter-defibrillator in situ 04/04/2011  . HYPERLIPIDEMIA, MIXED 10/23/2009  . Essential hypertension 10/23/2009  . Coronary atherosclerosis 10/23/2009    Orientation RESPIRATION BLADDER Height & Weight     Self, Time, Situation, Place  Normal Continent Weight: 140 lb 3.2 oz (63.594 kg) Height:  5\' 1"  (154.9 cm)  BEHAVIORAL SYMPTOMS/MOOD NEUROLOGICAL BOWEL NUTRITION STATUS   (none )  (none ) Continent Diet (Diet: 2 Grams Sodium. )  AMBULATORY STATUS COMMUNICATION OF NEEDS Skin   Extensive Assist Verbally Normal                       Personal Care Assistance Level of Assistance  Bathing, Feeding, Dressing Bathing Assistance: Limited assistance Feeding assistance:  Independent Dressing Assistance: Limited assistance     Functional Limitations Info  Sight, Hearing, Speech Sight Info: Impaired Hearing Info: Impaired Speech Info: Adequate    SPECIAL CARE FACTORS FREQUENCY  PT (By licensed PT), OT (By licensed OT)     PT Frequency:  (5) OT Frequency:  (5)            Contractures      Additional Factors Info  Code Status, Allergies Code Status Info:  (Full Code. ) Allergies Info:  (Ciprofloxacin, Ace Inhibitors, Amlodipine-atorvastatin, Amoxicillin, Quinolones)           Current Medications (03/14/2016):  This is the current hospital active medication list Current Facility-Administered Medications  Medication Dose Route Frequency Provider Last Rate Last Dose  . acetaminophen (TYLENOL) tablet 650 mg  650 mg Oral Q6H PRN Loletha Grayer, MD       Or  . acetaminophen (TYLENOL) suppository 650 mg  650 mg Rectal Q6H PRN Loletha Grayer, MD      . aspirin chewable tablet 81 mg  81 mg Oral Daily Gladstone Lighter, MD   81 mg at 03/14/16 1109  . benzonatate (TESSALON) capsule 100 mg  100 mg Oral TID Gladstone Lighter, MD   100 mg at 03/14/16 0842  . clopidogrel (PLAVIX) tablet 75 mg  75 mg Oral Daily Loletha Grayer, MD   75 mg at 03/14/16 0842  . docusate sodium (COLACE) capsule 100 mg  100 mg Oral BID Gladstone Lighter, MD   100 mg at 03/14/16 1240  . enoxaparin (LOVENOX) injection 30 mg  30 mg Subcutaneous Q24H Gladstone Lighter, MD      . levothyroxine (SYNTHROID, LEVOTHROID) tablet 75 mcg  75 mcg Oral QAC breakfast Loletha Grayer, MD   75 mcg at 03/14/16 [phone]  . LORazepam (ATIVAN) tablet 1 mg  1 mg Oral QHS PRN Loletha Grayer, MD   1 mg at 03/13/16 2116  . multivitamin with minerals tablet 1 tablet  1 tablet Oral Q1200 Loletha Grayer, MD   1 tablet at 03/14/16 1109  . potassium chloride (K-DUR,KLOR-CON) CR tablet 10 mEq  10 mEq Oral BID Areta Haber Dunn, PA-C   10 mEq at 03/14/16 0841  . pravastatin (PRAVACHOL) tablet 10 mg  10 mg Oral QHS  Loletha Grayer, MD   10 mg at 03/13/16 2116  . sodium chloride 0.9 % bolus 250 mL  250 mL Intravenous Once Gladstone Lighter, MD   250 mL at 03/14/16 1109  . vitamin B-12 (CYANOCOBALAMIN) tablet 1,000 mcg  1,000 mcg Oral Q1200 Loletha Grayer, MD   1,000 mcg at 03/14/16 1109  . vitamin C (ASCORBIC ACID) tablet 500 mg  500 mg Oral Q1200 Loletha Grayer, MD   500 mg at 03/14/16 1109     Discharge Medications: Please see discharge summary for a list of discharge medications.  Relevant Imaging Results:  Relevant Lab Results:   Additional Information  (SSN: [id number])  Loralyn Freshwater, LCSW

## 2016-03-14 NOTE — Evaluation (Signed)
Physical Therapy Evaluation Patient Details Name: Erica [name] MRN: [id number] DOB: September 12, 1931 Today's Date: 03/14/2016   History of Present Illness  Erica [name] is an 82yo white female who comes to [facility] on 4/26 after 30 minute episode of RUE numbness, weakness, and heavily slurred speech. Pt reports TIA ~7YA, unable to recant symptoms, chronic orthostatic hypotension, progressive decline in mobility which pt/daughter attribute to acute dizziness/knee buckling upon coming to full stance.  Pt has sustained multiple falls over the past 6 months estimated at [facility], with one about 3 weeks ago wherein she sustained a direct fall onto face. In ED, MD noted left vision impairment, which patient cannot detail chronicity as she reports she has not noticed until screened. Pt lives in apt inside of daughter's home with household distance ambulation c RW at baseline, and  performs ADL c supervision and additional rest breaks.     Clinical Impression  At evaluation, pt is received semirecumbent in bed upon entry, daughter and cardiology present. The pt is awake and agreeable to participate. No acute distress noted at this time. The pt is alert and oriented x3, pleasant, and conversational. Pt is HOH and demonstrating inconsistency following simple commands during strength testing. Pt reports multiple falls in the last 6 months, estimated at 3x/week demonstrates two presyncopal events during session wherein she is found to be orthostaticly hypotensive (chronic condition per patient.) Pt grip strength is strong and symmetrical; global strength as screened during MMT presents with only mild impairment but is symmetrical. Functional mobility not tested due to presyncope. Sensation is intact in BUE and BLE. Vision in left eye is blurred, but patient is able to read large print across the room and distinguish colors with accuracy. It appears that patient has return to baseline in terms of CC, but orthostasis is reported  to be moderately worse than usual. At time of eval, HCT, Hb, Na+. K+, are all within norm ranges. Pt reports a 'charlie horse' in the right gastrocnemius during mobility, and says that these are a common occurrence at baseline.   Patient presenting with impairment of balance and activity tolerance, limiting ability to perform ADL and mobility tasks at safe level of function and prior level of independence. Patient will benefit from skilled intervention to address the above impairments and limitations, in order to restore to prior level of function, improve patient safety upon discharge, and to decrease falls risk.        Follow Up Recommendations Home health PT;Supervision for mobility/OOB;Supervision/Assistance - 24 hour (if orthostatic hypotension cannot be resolved, pt will need supervision for all mobility. )    Equipment Recommendations  None recommended by PT    Recommendations for Other Services       Precautions / Restrictions Precautions Precautions: Fall      Mobility  Bed Mobility Overal bed mobility: Modified Independent                Transfers Overall transfer level: Modified independent               General transfer comment: Presyncopal, stands only for ~20-30seconds, prior to slowed/slurred speech, and loss of eye contact toward a distant gaze.   Ambulation/Gait Ambulation/Gait assistance:  (unable to attempt due to presyncope and orthostatic hypotnesion. )              Stairs            Wheelchair Mobility    Modified Rankin (Stroke Patients Only)  Balance Overall balance assessment: Modified Independent;History of Falls   Sitting balance-Leahy Scale: Good         Standing balance comment: unable to test.                              Pertinent Vitals/Pain Pain Assessment: No/denies pain    Home Living Family/patient expects to be discharged to:: Private residence Living Arrangements:  Children Available Help at Discharge: Family;Available PRN/intermittently Type of Home: Apartment Home Access: Level entry     Home Layout: One level Home Equipment: Shower seat - built in;Walker - 4 wheels;Walker - 2 wheels;Wheelchair - manual      Prior Function Level of Independence: Needs assistance   Gait / Transfers Assistance Needed: household distances only with RW and multiple falls x 89months  ADL's / Homemaking Assistance Needed: supervision/additional time, rest breaks         Hand Dominance   Dominant Hand: Right    Extremity/Trunk Assessment   Upper Extremity Assessment: Overall WFL for tasks assessed;Generalized weakness (elbow ext 4/5; sensation intact. )           Lower Extremity Assessment: Overall WFL for tasks assessed;Generalized weakness (hamstrings 3+/5, sensation intact. )      Cervical / Trunk Assessment: Normal  Communication   Communication: No difficulties  Cognition Arousal/Alertness: Awake/alert Behavior During Therapy: WFL for tasks assessed/performed Overall Cognitive Status: Within Functional Limits for tasks assessed Area of Impairment: Following commands       Following Commands: Follows one step commands inconsistently (difficulty following commands for MMT, partially due to [facility], but not exclusively. )            General Comments      Exercises        Assessment/Plan    PT Assessment Patient needs continued PT services  PT Diagnosis Generalized weakness;Difficulty walking;Abnormality of gait   PT Problem List Decreased activity tolerance;Decreased balance;Decreased knowledge of precautions  PT Treatment Interventions Gait training;Balance training;Functional mobility training;Therapeutic activities;Therapeutic exercise;Patient/family education   PT Goals (Current goals can be found in the Care Plan section) Acute Rehab PT Goals Patient Stated Goal: improve activity tolerance. decrease falls PT Goal Formulation:  With patient/family Time For Goal Achievement: 03/28/16 Potential to Achieve Goals: Fair    Frequency Min 2X/week   Barriers to discharge Decreased caregiver support      Co-evaluation               End of Session Equipment Utilized During Treatment: Gait belt Activity Tolerance: Treatment limited secondary to medical complications (Comment);Patient tolerated treatment well Patient left: in bed;with call bell/phone within reach;with family/visitor present Nurse Communication: Mobility status;Precautions         Time: [id number] PT Time Calculation (min) (ACUTE ONLY): 34 min   Charges:   PT Evaluation $PT Eval Moderate Complexity: 1 Procedure PT Treatments $Therapeutic Activity: 8-22 mins   PT G Codes:        10:21 AM, Mar 24, 2016 Etta Grandchild, PT, DPT PRN Physical Therapist - [place] License # [id number] ([phone] (mobile)

## 2016-03-14 NOTE — Progress Notes (Signed)
Patient: Erica [name] / Admit Date: 03/12/2016 / Date of Encounter: 03/14/2016, 9:26 AM   Subjective: Patient feels weak, denies any significant chest pain or shortness of breath Daughter at the bedside, she has lost 9 pounds in the past 3 weeks Since her fall one month ago, unable to read, lost vision in her left eye, legs are much weaker Precipitous decline in her strength over the past week, daughter very concerned as patient shows tremendous gait instability. Family feel she needs inpatient rehabilitation  Review of Systems: Review of Systems  Constitutional: Positive for malaise/fatigue.  Respiratory: Negative.   Cardiovascular: Negative.   Gastrointestinal: Negative.   Musculoskeletal: Negative.   Neurological: Negative.   Psychiatric/Behavioral: Negative.   All other systems reviewed and are negative.   Objective: Telemetry: Paced rhythm, AV paced Physical Exam: Blood pressure 134/74, pulse 89, temperature 98.2 F (36.8 C), temperature source Oral, resp. rate 18, height 5\' 1"  (1.549 m), weight 140 lb 3.2 oz (63.594 kg), SpO2 98 %. Body mass index is 26.5 kg/(m^2). General: Thin  in no acute distress. Head: Normocephalic, atraumatic, sclera non-icteric, no xanthomas, nares are without discharge. Neck: Negative for carotid bruits. JVP not elevated. Lungs: Clear bilaterally to auscultation without wheezes, rales, or rhonchi. Breathing is unlabored. Heart: RRR S1 S2 without murmurs, rubs, or gallops.  Abdomen: Soft, non-tender, non-distended with normoactive bowel sounds. No rebound/guarding. Extremities: No clubbing or cyanosis. No edema. Distal pedal pulses are 2+ and equal bilaterally. Neuro: Alert and oriented X 3. Moves all extremities spontaneously. Psych:  Responds to questions appropriately with a normal affect.   Intake/Output Summary (Last 24 hours) at 03/14/16 0926 Last data filed at 03/14/16 0215  Gross per 24 hour  Intake    480 ml  Output   1050 ml  Net    -570 ml    Inpatient Medications:  . benzonatate  100 mg Oral TID  . carvedilol  3.125 mg Oral BID  . clopidogrel  75 mg Oral Daily  . enoxaparin (LOVENOX) injection  30 mg Subcutaneous Q24H  . furosemide  20 mg Oral Daily  . levothyroxine  75 mcg Oral QAC breakfast  . multivitamin with minerals  1 tablet Oral Q1200  . potassium chloride  10 mEq Oral BID  . pravastatin  10 mg Oral QHS  . vitamin B-12  1,000 mcg Oral Q1200  . vitamin C  500 mg Oral Q1200   Infusions:    Labs:  Recent Labs  03/13/16 0319 03/14/16 0609  NA 136 135  K 4.0 4.3  CL 103 101  CO2 26 27  GLUCOSE 109* 106*  BUN 14 18  CREATININE 1.13* 1.25*  CALCIUM 8.9 9.1   No results for input(s): AST, ALT, ALKPHOS, BILITOT, PROT, ALBUMIN in the last 72 hours.  Recent Labs  03/12/16 1415 03/13/16 0319  WBC 7.6 6.1  HGB 12.4 11.9*  HCT 36.1 35.0  MCV 88.9 88.8  PLT 202 217    Recent Labs  03/13/16 0713 03/13/16 1056 03/13/16 1538 03/13/16 2006  TROPONINI 0.58* 0.59* 0.44* 0.37*   Invalid input(s): POCBNP No results for input(s): HGBA1C in the last 72 hours.   Weights: Filed Weights   03/12/16 1301 03/12/16 1848  Weight: 141 lb (63.957 kg) 140 lb 3.2 oz (63.594 kg)     Radiology/Studies:  Dg Chest 2 View  03/12/2016  CLINICAL DATA:  Shortness of breath EXAM: CHEST  2 VIEW COMPARISON:  12/28/2013 FINDINGS: There is diffuse bilateral chronic interstitial  thickening. There is superimposed more severe interstitial prominence most notably at the lung bases. There is no pneumothorax. There is no significant pleural effusion. There is stable cardiomegaly. There is an AICD noted. The osseous structures are unremarkable. IMPRESSION: 1. Bilateral diffuse interstitial thickening superimposed upon chronic interstitial lung disease. These findings are concerning for CHF. Electronically Signed   By: Kathreen Devoid   On: 03/12/2016 17:22   Ct Head Wo Contrast  03/13/2016  CLINICAL DATA:  Stroke.  Slurred  speech and right arm numbness EXAM: CT HEAD WITHOUT CONTRAST TECHNIQUE: Contiguous axial images were obtained from the base of the skull through the vertex without intravenous contrast. COMPARISON:  CT head 03/12/2016 FINDINGS: Generalized atrophy. Hypodensity throughout the cerebral white matter is stable from yesterday and consistent with chronic microvascular ischemia. Negative for acute infarct. Negative for intracranial hemorrhage. Negative for mass or edema. Negative calvarium. IMPRESSION: Atrophy and chronic microvascular ischemic changes. No acute abnormality. Electronically Signed   By: Franchot Gallo M.D.   On: 03/13/2016 16:35   Ct Head Wo Contrast  03/12/2016  CLINICAL DATA:  Right arm numbness this morning. Blurry vision for 1 week. EXAM: CT HEAD WITHOUT CONTRAST TECHNIQUE: Contiguous axial images were obtained from the base of the skull through the vertex without contrast. COMPARISON:  02/08/2016 FINDINGS: Stable low-density in the periventricular and subcortical white matter. No evidence for acute hemorrhage, mass lesion, midline shift, hydrocephalus or large infarct. Visualized paranasal sinuses are clear. No calvarial fracture. IMPRESSION: No acute intracranial abnormality. Stable white matter disease. Findings likely represent chronic small vessel ischemic changes. Electronically Signed   By: Markus Daft M.D.   On: 03/12/2016 15:26   US Carotid Bilateral  03/12/2016  CLINICAL DATA:  CVA. EXAM: BILATERAL CAROTID DUPLEX ULTRASOUND TECHNIQUE: Pearline Cables scale imaging, color Doppler and duplex ultrasound were performed of bilateral carotid and vertebral arteries in the neck. COMPARISON:  None. FINDINGS: Criteria: Quantification of carotid stenosis is based on velocity parameters that correlate the residual internal carotid diameter with NASCET-based stenosis levels, using the diameter of the distal internal carotid lumen as the denominator for stenosis measurement. The following velocity measurements  were obtained: RIGHT ICA:  43 cm/sec CCA:  59 cm/sec SYSTOLIC ICA/CCA RATIO:  [id number] DIASTOLIC ICA/CCA RATIO:  1.1 ECA:  102 cm/sec LEFT ICA:  58 cm/sec CCA:  63 cm/sec SYSTOLIC ICA/CCA RATIO:  [id number] DIASTOLIC ICA/CCA RATIO:  1.4 ECA:  67 cm/sec RIGHT CAROTID ARTERY: No appreciable plaque in the right carotid bulb or bifurcation. No hemodynamically significant stenosis in the right internal carotid artery by Doppler criteria. RIGHT VERTEBRAL ARTERY:  Normal antegrade flow. LEFT CAROTID ARTERY: No appreciable plaque in the left carotid bulb and bifurcation. No hemodynamically significant stenosis in the left internal carotid artery by Doppler criteria. LEFT VERTEBRAL ARTERY:  Normal antegrade flow. IMPRESSION: 1. No hemodynamically significant stenosis in either carotid (0- 49%). 2. Normal antegrade flow in both vertebral arteries. Electronically Signed   By: Ilona Sorrel M.D.   On: 03/12/2016 17:35     Assessment and Plan  82 year old female with past medical history significant for CAD status post ends, non-ischemic cardiomyopathy status post AICD, chronic combined congestive heart failure with last known ejection fraction of 35-40%, hypertension, interstitial lung disease presents to the hospital secondary to slurred speech and right upper extremity weakness which resolved.  #1 TIA-resolved symptoms of slurred speech and right upper extremity weakness. -CT of the head with no acute findings.  -Carotid Dopplers with no significant stenosis. Echo with no  source for CVA/TIA -Physical therapy and occupational therapy consult pending ------ would consider inpatient rehabilitation if clinically indicated. Tremendous weakness recently, blind in left eye --asa 81 mg daily with plavix  #2 elevated troponin-Demand ischemia from neurological event.  Echocardiogram with no wall motion abnormality. EF 30-35%. No angina like symptoms. On Coreg, stopped statin on her own -Patient is allergic to ace inhibitors.    Consider ARB as outpatient  #3 dyspnea- symptomatic dyspnea with underlying CHF and poor ejection fraction. -Sats are stable on room air.  #4 nonischemic cardiomyopathy with combined chronic heart failure- status post biventricular ICD. -Change Lasix to oral, every day -Echocardiogram with EF 30-35%. No wall motion abnormalities.Marland Kitchen Appreciate cardiology consult. -On Plavix, Coreg and statin. Consider adding asa 81   #5 frequent falls and dizziness-  decreased visual acuity. TIAs x 2, leg weakness physical therapy consult pending. Would  benefit from assisted living.  #6 DVT prophylaxis-`  on Lovenox   Signed, Esmond Plants, MD, Ph.D. [facility] 03/14/2016, 9:26 AM

## 2016-03-14 NOTE — Care Management (Signed)
Case discussed with Dr. Rockey Situ. PT pending. Daughter interested in SNF.

## 2016-03-14 NOTE — Evaluation (Signed)
Occupational Therapy Evaluation Patient Details Name: Erica [name] MRN: [id number] DOB: 12/29/1930 Today's Date: 03/14/2016    History of Present Illness Erica [name] is an 82yo white female who comes to [facility] on 4/26 after 30 minute episode of RUE numbness, weakness, and heavily slurred speech. Pt reports TIA ~7YA, unable to recant symptoms, chronic orthostatic hypotension, progressive decline in mobility which pt/daughter attribute to acute dizziness/knee buckling upon coming to full stance.  Pt has sustained multiple falls over the past 6 months estimated at [facility], with one about 3 weeks ago wherein she sustained a direct fall onto face. In ED, MD noted left vision impairment, which patient cannot detail chronicity as she reports she has not noticed until screened. Pt lives in apt inside of daughter's home with household distance ambulation c RW at baseline, and  performs ADL c supervision and additional rest breaks.    Clinical Impression   Pt. Is an 82 y.o. Female who was admitted to [facility] for a CVA. Pt. Presents with weakness, decreased activity tolerance, orthostatic hypotension, and visual impairments which hinder her ability to complete ADLs, and IADL tasks. Pt. Could benefit from continued skilled OT services for ADL, and IADL retraining, visual compensatory strategies for ADLs/IADLs, and for conitnued assessment for strength UE strength, and coordination. Pt. And family are planning for pt. to discharge to SNF today.    Follow Up Recommendations       Equipment Recommendations       Recommendations for Other Services       Precautions / Restrictions Precautions Precautions: Fall      Mobility Bed Mobility                  Transfers                      Balance                                            ADL Overall ADL's : Needs assistance/impaired Eating/Feeding: Set up (Visual cues and assist for meal set-up.)    Grooming:  Minimal assistance                                 General ADL Comments: Pt. requires extensive assist for LE ADLs.      Vision     Perception     Praxis      Pertinent Vitals/Pain       Hand Dominance Right   Extremity/Trunk Assessment Upper Extremity Assessment Upper Extremity Assessment: Overall WFL for tasks assessed;Generalized weakness      Communication Communication Communication: No difficulties   Cognition Arousal/Alertness: Awake/alert Behavior During Therapy: WFL for tasks assessed/performed Overall Cognitive Status: Within Functional Limits for tasks assessed Area of Impairment: Following commands       Following Commands: Follows one step commands consistently           General Comments       Exercises       Shoulder Instructions      Home Living Family/patient expects to be discharged to:: Private residence Living Arrangements: Children Available Help at Discharge: Family;Available PRN/intermittently Type of Home: Apartment Home Access: Level entry     Home Layout: One level     Bathroom Shower/Tub: Walk-in shower (With a bench,  and a handheld shower head.)   Bathroom Toilet: Standard Bathroom Accessibility: Yes   Home Equipment: Shower seat - built in;Walker - 4 wheels;Walker - 2 wheels;Wheelchair - manual          Prior Functioning/Environment Level of Independence: Needs assistance    ADL's / Homemaking Assistance Needed: supervision/additional time, rest breaks         OT Diagnosis: Generalized weakness   OT Problem List: Decreased strength   OT Treatment/Interventions: Self-care/ADL training;Therapeutic exercise;DME and/or AE instruction;Therapeutic activities    OT Goals(Current goals can be found in the care plan section)    OT Frequency: Min 1X/week   Barriers to D/C:            Co-evaluation              End of Session    Activity Tolerance: Patient tolerated treatment  well Patient left: in bed;with call bell/phone within reach;with bed alarm set   Time: 1510-1530 OT Time Calculation (min): 20 min Charges:  OT General Charges $OT Visit: 1 Procedure OT Evaluation $OT Eval Moderate Complexity: 1 Procedure G-Codes:    Harrel Carina, MS, OTR/L Harrel Carina 03/14/2016, 3:43 PM

## 2016-03-14 NOTE — Discharge Summary (Signed)
Erica [name] NAME: Erica [name]    MR#:  [id number]  DATE OF BIRTH:  1931-10-14  DATE OF ADMISSION:  03/12/2016 ADMITTING PHYSICIAN: Loletha Grayer, MD  DATE OF DISCHARGE: 03/14/2016  PRIMARY CARE PHYSICIAN: Kirk Ruths., MD    ADMISSION DIAGNOSIS:  Cough [R05] CVA (cerebral infarction) [I63.9] Transient cerebral ischemia, unspecified transient cerebral ischemia type [G45.9]  DISCHARGE DIAGNOSIS:  Active Problems:   CVA (cerebral infarction)   Elevated troponin   TIA (transient ischemic attack)   Chronic systolic CHF (congestive heart failure) (HCC)   Falls   Leg weakness, bilateral   SECONDARY DIAGNOSIS:   Past Medical History  Diagnosis Date  . Nonischemic cardiomyopathy (HCC)     s/p Medtronic CRT-D, NYHA II-III at baseline  . Coronary artery disease     a. s/p prior stent-RCA b. borderline CAD on 04/2013 cath  . History of MI (myocardial infarction)   . Dyslipidemia   . Hypertension   . COPD (chronic obstructive pulmonary disease) ([place])   . Hypothyroidism   . Basal cell carcinoma 2011  . Respiratory infection   . Vitamin D deficiency   . Breast cancer ([place]) 30+ yrs. ago    right breast, radiation    HOSPITAL COURSE:   82 year old female with past medical history significant for CAD status post ends, non-ischemic cardiomyopathy status post AICD, chronic combined congestive heart failure with last known ejection fraction of 35-40%, hypertension, interstitial lung disease presents to the hospital secondary to slurred speech and right upper extremity weakness which resolved.  #1 TIA-resolved symptoms of slurred speech and right upper extremity weakness. -CT of the head with no acute findings. Chronic small vessel ischemic changes noted. Cannot get MRI done due to defibrillator. -Repeat CT head with no acute findings.  -Plavix has been added to her regimen. Continue aspirin -LDL is 81. On  low-dose statin -Carotid Dopplers with no significant stenosis. Echo with no source for CVA/TIA -Physical therapy recommended patient will need assistance and close monitoring. She will benefit from skilled nursing.  #2 elevated troponin-Demand ischemia from neurological event. -Appreciate cardiology consult. Echocardiogram with no wall motion abnormality. EF 30-35%. No angina like symptoms. -On Plavix and statin. -Patient is allergic to ace inhibitors. Consider adding an ARB as outpatient if  blood pressure is improved  #3 orthostatic hypertension-patient has known history of orthostatic hypotension even as outpatient,. Discussed with cardiology-discontinued her Coreg and also Lasix at this time -Continue checking orthostatics, she is more symptomatic in the mornings. So check orthostatic sitting and standing once a day in the morning. If systolic blood pressure is less than 100, give 5 mg of midodrine.  #4 nonischemic cardiomyopathy with combined chronic heart failure-status post biventricular ICD. -Echocardiogram with EF 30-35%. No wall motion abnormalities.Marland Kitchen Appreciate cardiology consult. -On Plavix, asa and  Statin. - off coreg and lasix due to orthostatic hypotension  #5 Frequent falls and dizziness-due to generalized fatigue and also decreased visual acuity. physical therapy consulted. -Might benefit from skilled nursing   DISCHARGE CONDITIONS:   Guarded  CONSULTS OBTAINED:  Treatment Team:  Wellington Hampshire, MD Catarina Hartshorn, MD Alexis Goodell, MD  DRUG ALLERGIES:   Allergies  Allergen Reactions  . Ciprofloxacin Anaphylaxis and Hives  . Ace Inhibitors Other (See Comments)    Reaction:  Unknown   . Amlodipine-Atorvastatin Other (See Comments)    Reaction:  Unknown   . Amoxicillin Itching, Rash and Other (See Comments)    Has  patient had a PCN reaction causing immediate rash, facial/tongue/throat swelling, SOB or lightheadedness with hypotension: No Has patient had  a PCN reaction causing severe rash involving mucus membranes or skin necrosis: No Has patient had a PCN reaction that required hospitalization No Has patient had a PCN reaction occurring within the last 10 years: Yes If all of the above answers are "NO", then may proceed with Cephalosporin use.  . Quinolones Itching and Rash    DISCHARGE MEDICATIONS:   Current Discharge Medication List    START taking these medications   Details  aspirin 81 MG chewable tablet Chew 1 tablet (81 mg total) by mouth daily. Qty: 30 tablet, Refills: 2    benzonatate (TESSALON) 100 MG capsule Take 1 capsule (100 mg total) by mouth 3 (three) times daily. X 7 days Qty: 20 capsule, Refills: 0    clopidogrel (PLAVIX) 75 MG tablet Take 1 tablet (75 mg total) by mouth daily. Qty: 30 tablet, Refills: 2    docusate sodium (COLACE) 100 MG capsule Take 1 capsule (100 mg total) by mouth 2 (two) times daily. Qty: 10 capsule, Refills: 0    Fluticasone-Salmeterol (ADVAIR DISKUS) 250-50 MCG/DOSE AEPB Inhale 1 puff into the lungs 2 (two) times daily. Qty: 60 each, Refills: 1    midodrine (PROAMATINE) 5 MG tablet Take 1 tablet (5 mg total) by mouth daily as needed (check orthostatics q daily in morning if standing BP<100, please give 1 dose of midodrine 5mg .). Qty: 30 tablet, Refills: 0      CONTINUE these medications which have CHANGED   Details  LORazepam (ATIVAN) 1 MG tablet Take 1 tablet (1 mg total) by mouth at bedtime as needed for anxiety or sleep. Qty: 20 tablet, Refills: 0      CONTINUE these medications which have NOT CHANGED   Details  albuterol (PROVENTIL HFA;VENTOLIN HFA) 108 (90 BASE) MCG/ACT inhaler Inhale 2 puffs into the lungs every 6 (six) hours as needed for wheezing or shortness of breath. Qty: 1 Inhaler, Refills: 0    Ascorbic Acid (VITAMIN C) 500 MG tablet Take 500 mg by mouth daily at 12 noon.     levothyroxine (SYNTHROID, LEVOTHROID) 75 MCG tablet Take 75 mcg by mouth daily before  breakfast.     Multiple Vitamins-Minerals (MULTIVITAMIN GUMMIES ADULT PO) Take 1 each by mouth daily at 12 noon.     pravastatin (PRAVACHOL) 10 MG tablet Take 10 mg by mouth at bedtime.  Refills: 0    vitamin B-12 (CYANOCOBALAMIN) 1000 MCG tablet Take 1,000 mcg by mouth daily at 12 noon.       STOP taking these medications     carvedilol (COREG) 3.125 MG tablet      furosemide (LASIX) 20 MG tablet          DISCHARGE INSTRUCTIONS:   1. Follow-up with cardiology in 1-2 weeks 2. PCP follow-up in 1 week    If you experience worsening of your admission symptoms, develop shortness of breath, life threatening emergency, suicidal or homicidal thoughts you must seek medical attention immediately by calling 911 or calling your MD immediately  if symptoms less severe.  You Must read complete instructions/literature along with all the possible adverse reactions/side effects for all the Medicines you take and that have been prescribed to you. Take any new Medicines after you have completely understood and accept all the possible adverse reactions/side effects.   Please note  You were cared for by a hospitalist during your hospital stay. If you have any  questions about your discharge medications or the care you received while you were in the hospital after you are discharged, you can call the unit and asked to speak with the hospitalist on call if the hospitalist that took care of you is not available. Once you are discharged, your primary care physician will handle any further medical issues. Please note that NO REFILLS for any discharge medications will be authorized once you are discharged, as it is imperative that you return to your primary care physician (or establish a relationship with a primary care physician if you do not have one) for your aftercare needs so that they can reassess your need for medications and monitor your lab values.    Today   CHIEF COMPLAINT:   Chief Complaint   Patient presents with  . Weakness    VITAL SIGNS:  Blood pressure 134/74, pulse 89, temperature 98.2 F (36.8 C), temperature source Oral, resp. rate 18, height 5\' 1"  (1.549 m), weight 63.594 kg (140 lb 3.2 oz), SpO2 97 %.  I/O:   Intake/Output Summary (Last 24 hours) at 03/14/16 1507 Last data filed at 03/14/16 0900  Gross per 24 hour  Intake    480 ml  Output   1050 ml  Net   -570 ml    PHYSICAL EXAMINATION:   Physical Exam  GENERAL: 82 y.o.-year-old patient lying in the bed with no acute distress. Hard of hearing. EYES: Pupils equal, round, reactive to light and accommodation. No scleral icterus. Extraocular muscles intact.  HEENT: Head atraumatic, normocephalic. Oropharynx and nasopharynx clear.  NECK: Supple, no jugular venous distention. No thyroid enlargement, no tenderness.  LUNGS: Normal breath sounds bilaterally, no wheezing, rales,rhonchi or crepitation. No use of accessory muscles of respiration. Fine bibasilar crackles at the bases. CARDIOVASCULAR: S1, S2 normal. No rubs, or gallops. 3/6 systolic murmur is present. ABDOMEN: Soft, nontender, nondistended. Bowel sounds present. No organomegaly or mass.  EXTREMITIES: No pedal edema, cyanosis, or clubbing.  NEUROLOGIC: Cranial nerves II through XII are intact except decreased visual acuity in left eye. Muscle strength 5/5 in all extremities. Sensation intact. Gait not checked.  PSYCHIATRIC: The patient is alert and oriented x 3.  SKIN: No obvious rash, lesion, or ulcer.   DATA REVIEW:   CBC  Recent Labs Lab 03/13/16 0319  WBC 6.1  HGB 11.9*  HCT 35.0  PLT 217    Chemistries   Recent Labs Lab 03/14/16 0609  NA 135  K 4.3  CL 101  CO2 27  GLUCOSE 106*  BUN 18  CREATININE 1.25*  CALCIUM 9.1    Cardiac Enzymes  Recent Labs Lab 03/13/16 2006  TROPONINI 0.37*    Microbiology Results  Results for orders placed or performed during the hospital encounter of 04/05/15  Surgical pcr  screen     Status: None   Collection Time: 04/05/15  1:23 PM  Result Value Ref Range Status   MRSA, PCR NEGATIVE NEGATIVE Final   Staphylococcus aureus NEGATIVE NEGATIVE Final    Comment:        The Xpert SA Assay (FDA approved for NASAL specimens in patients over 20 years of age), is one component of a comprehensive surveillance program.  Test performance has been validated by [facility] for patients greater than or equal to 53 year old. It is not intended to diagnose infection nor to guide or monitor treatment.     RADIOLOGY:  Dg Chest 2 View  03/12/2016  CLINICAL DATA:  Shortness of breath EXAM: CHEST  2 VIEW COMPARISON:  12/28/2013 FINDINGS: There is diffuse bilateral chronic interstitial thickening. There is superimposed more severe interstitial prominence most notably at the lung bases. There is no pneumothorax. There is no significant pleural effusion. There is stable cardiomegaly. There is an AICD noted. The osseous structures are unremarkable. IMPRESSION: 1. Bilateral diffuse interstitial thickening superimposed upon chronic interstitial lung disease. These findings are concerning for CHF. Electronically Signed   By: Kathreen Devoid   On: 03/12/2016 17:22   Ct Head Wo Contrast  03/13/2016  CLINICAL DATA:  Stroke.  Slurred speech and right arm numbness EXAM: CT HEAD WITHOUT CONTRAST TECHNIQUE: Contiguous axial images were obtained from the base of the skull through the vertex without intravenous contrast. COMPARISON:  CT head 03/12/2016 FINDINGS: Generalized atrophy. Hypodensity throughout the cerebral white matter is stable from yesterday and consistent with chronic microvascular ischemia. Negative for acute infarct. Negative for intracranial hemorrhage. Negative for mass or edema. Negative calvarium. IMPRESSION: Atrophy and chronic microvascular ischemic changes. No acute abnormality. Electronically Signed   By: Franchot Gallo M.D.   On: 03/13/2016 16:35   Ct Head Wo  Contrast  03/12/2016  CLINICAL DATA:  Right arm numbness this morning. Blurry vision for 1 week. EXAM: CT HEAD WITHOUT CONTRAST TECHNIQUE: Contiguous axial images were obtained from the base of the skull through the vertex without contrast. COMPARISON:  02/08/2016 FINDINGS: Stable low-density in the periventricular and subcortical white matter. No evidence for acute hemorrhage, mass lesion, midline shift, hydrocephalus or large infarct. Visualized paranasal sinuses are clear. No calvarial fracture. IMPRESSION: No acute intracranial abnormality. Stable white matter disease. Findings likely represent chronic small vessel ischemic changes. Electronically Signed   By: Markus Daft M.D.   On: 03/12/2016 15:26   US Carotid Bilateral  03/12/2016  CLINICAL DATA:  CVA. EXAM: BILATERAL CAROTID DUPLEX ULTRASOUND TECHNIQUE: Pearline Cables scale imaging, color Doppler and duplex ultrasound were performed of bilateral carotid and vertebral arteries in the neck. COMPARISON:  None. FINDINGS: Criteria: Quantification of carotid stenosis is based on velocity parameters that correlate the residual internal carotid diameter with NASCET-based stenosis levels, using the diameter of the distal internal carotid lumen as the denominator for stenosis measurement. The following velocity measurements were obtained: RIGHT ICA:  43 cm/sec CCA:  59 cm/sec SYSTOLIC ICA/CCA RATIO:  [id number] DIASTOLIC ICA/CCA RATIO:  1.1 ECA:  102 cm/sec LEFT ICA:  58 cm/sec CCA:  63 cm/sec SYSTOLIC ICA/CCA RATIO:  [id number] DIASTOLIC ICA/CCA RATIO:  1.4 ECA:  67 cm/sec RIGHT CAROTID ARTERY: No appreciable plaque in the right carotid bulb or bifurcation. No hemodynamically significant stenosis in the right internal carotid artery by Doppler criteria. RIGHT VERTEBRAL ARTERY:  Normal antegrade flow. LEFT CAROTID ARTERY: No appreciable plaque in the left carotid bulb and bifurcation. No hemodynamically significant stenosis in the left internal carotid artery by Doppler criteria. LEFT  VERTEBRAL ARTERY:  Normal antegrade flow. IMPRESSION: 1. No hemodynamically significant stenosis in either carotid (0- 49%). 2. Normal antegrade flow in both vertebral arteries. Electronically Signed   By: Ilona Sorrel M.D.   On: 03/12/2016 17:35    EKG:   Orders placed or performed during the hospital encounter of 03/12/16  . ED EKG  . ED EKG  . EKG 12-Lead  . EKG 12-Lead      Management plans discussed with the patient, family and they are in agreement.  CODE STATUS:     Code Status Orders        Start     Ordered  03/12/16 1632  Full code   Continuous     03/12/16 1631    Code Status History    Date Active Date Inactive Code Status Order ID Comments User Context   This patient has a current code status but no historical code status.    Advance Directive Documentation        Most Recent Value   Type of Advance Directive  Healthcare Power of Attorney   Pre-existing out of facility DNR order (yellow form or pink MOST form)     "MOST" Form in Place?        TOTAL TIME TAKING CARE OF THIS PATIENT: 37 minutes.    Gladstone Lighter M.D on 03/14/2016 at 3:07 PM  Between 7am to 6pm - Pager - [phone]  After 6pm go to [email or web address] - password [email or web address] [place] [facility]  Office  [phone]  CC: Primary care physician; Kirk Ruths., MD

## 2016-03-14 NOTE — Clinical Social Work Placement (Signed)
   CLINICAL SOCIAL WORK PLACEMENT  NOTE  Date:  03/14/2016  Patient Details  Name: Erica [name] MRN: [id number] Date of Birth: 06/23/1931  Clinical Social Work is seeking post-discharge placement for this patient at the [place] level of care (*CSW will initial, date and re-position this form in  chart as items are completed):  Yes   Patient/family provided with [place] Work Department's list of facilities offering this level of care within the geographic area requested by the patient (or if unable, by the patient's family).  Yes   Patient/family informed of their freedom to choose among providers that offer the needed level of care, that participate in Medicare, Medicaid or managed care program needed by the patient, have an available bed and are willing to accept the patient.  Yes   Patient/family informed of [facility]'s ownership interest in [facility] and [facility], as well as of the fact that they are under no obligation to receive care at these facilities.  PASRR submitted to EDS on 03/14/16     PASRR number received on 03/14/16     Existing PASRR number confirmed on       FL2 transmitted to all facilities in geographic area requested by pt/family on 03/14/16     FL2 transmitted to all facilities within larger geographic area on       Patient informed that his/her managed care company has contracts with or will negotiate with certain facilities, including the following:        Yes   Patient/family informed of bed offers received.  Patient [facility] bed at [facility]     Physician recommends and patient chooses bed at  [facility])    Patient to be transferred to [facility] on 03/14/16.  Patient to be transferred to facility by pt's family      Patient family notified on 03/14/16 of transfer.  Name of family member notified:  pt and family      PHYSICIAN       Additional Comment:     _______________________________________________ Darden Dates, LCSW 03/14/2016, 4:09 PM

## 2016-03-14 NOTE — Progress Notes (Signed)
Anticoagulation monitoring(Lovenox):  82yo F ordered Lovenox 40 mg Q24h  Filed Weights   03/12/16 1301 03/12/16 1848  Weight: 141 lb (63.957 kg) 140 lb 3.2 oz (63.594 kg)   BMI 26.5   Lab Results  Component Value Date   CREATININE 1.25* 03/14/2016   CREATININE 1.13* 03/13/2016   CREATININE 1.19* 03/12/2016   Estimated Creatinine Clearance: 29.1 mL/min (by C-G formula based on Cr of 1.25). Hemoglobin & Hematocrit     Component Value Date/Time   HGB 11.9* 03/13/2016 0319   HGB 10.8* 12/28/2013 1547   HCT 35.0 03/13/2016 0319   HCT 33.9* 03/23/2015 0840   HCT 31.4* 12/28/2013 1547     Per Protocol for Patient with estCrcl< 30 ml/min and BMI < 40, will transition to Lovenox 30 mg Q24h     Chinita Greenland PharmD Clinical Pharmacist 03/14/2016

## 2016-03-17 DIAGNOSIS — F411 Generalized anxiety disorder: Secondary | ICD-10-CM | POA: Diagnosis not present

## 2016-03-17 DIAGNOSIS — J449 Chronic obstructive pulmonary disease, unspecified: Secondary | ICD-10-CM | POA: Diagnosis not present

## 2016-03-17 DIAGNOSIS — Z7902 Long term (current) use of antithrombotics/antiplatelets: Secondary | ICD-10-CM | POA: Diagnosis not present

## 2016-03-17 DIAGNOSIS — Z8673 Personal history of transient ischemic attack (TIA), and cerebral infarction without residual deficits: Secondary | ICD-10-CM | POA: Diagnosis not present

## 2016-03-17 DIAGNOSIS — I252 Old myocardial infarction: Secondary | ICD-10-CM | POA: Diagnosis not present

## 2016-03-17 DIAGNOSIS — E785 Hyperlipidemia, unspecified: Secondary | ICD-10-CM | POA: Diagnosis not present

## 2016-03-17 DIAGNOSIS — I2510 Atherosclerotic heart disease of native coronary artery without angina pectoris: Secondary | ICD-10-CM | POA: Diagnosis not present

## 2016-03-17 DIAGNOSIS — N390 Urinary tract infection, site not specified: Secondary | ICD-10-CM | POA: Diagnosis not present

## 2016-03-17 DIAGNOSIS — Z95810 Presence of automatic (implantable) cardiac defibrillator: Secondary | ICD-10-CM | POA: Diagnosis not present

## 2016-03-17 DIAGNOSIS — Z853 Personal history of malignant neoplasm of breast: Secondary | ICD-10-CM | POA: Diagnosis not present

## 2016-03-17 DIAGNOSIS — Z7982 Long term (current) use of aspirin: Secondary | ICD-10-CM | POA: Diagnosis not present

## 2016-03-17 DIAGNOSIS — I951 Orthostatic hypotension: Secondary | ICD-10-CM | POA: Diagnosis not present

## 2016-03-17 DIAGNOSIS — I5042 Chronic combined systolic (congestive) and diastolic (congestive) heart failure: Secondary | ICD-10-CM | POA: Diagnosis not present

## 2016-03-17 DIAGNOSIS — G933 Postviral fatigue syndrome: Secondary | ICD-10-CM | POA: Diagnosis not present

## 2016-03-17 DIAGNOSIS — I959 Hypotension, unspecified: Secondary | ICD-10-CM | POA: Diagnosis not present

## 2016-03-17 DIAGNOSIS — E039 Hypothyroidism, unspecified: Secondary | ICD-10-CM | POA: Diagnosis not present

## 2016-03-17 DIAGNOSIS — Z9181 History of falling: Secondary | ICD-10-CM | POA: Diagnosis not present

## 2016-03-17 DIAGNOSIS — I429 Cardiomyopathy, unspecified: Secondary | ICD-10-CM | POA: Diagnosis not present

## 2016-03-17 DIAGNOSIS — Z85820 Personal history of malignant melanoma of skin: Secondary | ICD-10-CM | POA: Diagnosis not present

## 2016-03-17 DIAGNOSIS — G459 Transient cerebral ischemic attack, unspecified: Secondary | ICD-10-CM | POA: Diagnosis not present

## 2016-03-20 ENCOUNTER — Ambulatory Visit: Payer: PPO | Admitting: Physician Assistant

## 2016-03-21 DIAGNOSIS — G933 Postviral fatigue syndrome: Secondary | ICD-10-CM | POA: Diagnosis not present

## 2016-03-21 DIAGNOSIS — N390 Urinary tract infection, site not specified: Secondary | ICD-10-CM | POA: Diagnosis not present

## 2016-03-21 DIAGNOSIS — G459 Transient cerebral ischemic attack, unspecified: Secondary | ICD-10-CM | POA: Diagnosis not present

## 2016-03-21 DIAGNOSIS — I959 Hypotension, unspecified: Secondary | ICD-10-CM | POA: Diagnosis not present

## 2016-03-28 DIAGNOSIS — H3412 Central retinal artery occlusion, left eye: Secondary | ICD-10-CM | POA: Diagnosis not present

## 2016-03-28 DIAGNOSIS — Z9842 Cataract extraction status, left eye: Secondary | ICD-10-CM | POA: Diagnosis not present

## 2016-03-28 DIAGNOSIS — Z9841 Cataract extraction status, right eye: Secondary | ICD-10-CM | POA: Diagnosis not present

## 2016-04-01 ENCOUNTER — Encounter: Payer: Self-pay | Admitting: Physician Assistant

## 2016-04-01 ENCOUNTER — Ambulatory Visit (INDEPENDENT_AMBULATORY_CARE_PROVIDER_SITE_OTHER): Payer: PPO | Admitting: Physician Assistant

## 2016-04-01 VITALS — BP 118/76 | HR 96 | Ht 62.0 in | Wt 139.5 lb

## 2016-04-01 DIAGNOSIS — I5042 Chronic combined systolic (congestive) and diastolic (congestive) heart failure: Secondary | ICD-10-CM | POA: Diagnosis not present

## 2016-04-01 DIAGNOSIS — H5440 Blindness, one eye, unspecified eye: Secondary | ICD-10-CM | POA: Insufficient documentation

## 2016-04-01 DIAGNOSIS — I447 Left bundle-branch block, unspecified: Secondary | ICD-10-CM

## 2016-04-01 DIAGNOSIS — E039 Hypothyroidism, unspecified: Secondary | ICD-10-CM | POA: Diagnosis not present

## 2016-04-01 DIAGNOSIS — R296 Repeated falls: Secondary | ICD-10-CM

## 2016-04-01 DIAGNOSIS — N182 Chronic kidney disease, stage 2 (mild): Secondary | ICD-10-CM

## 2016-04-01 DIAGNOSIS — I429 Cardiomyopathy, unspecified: Secondary | ICD-10-CM | POA: Diagnosis not present

## 2016-04-01 DIAGNOSIS — R531 Weakness: Secondary | ICD-10-CM

## 2016-04-01 DIAGNOSIS — I2510 Atherosclerotic heart disease of native coronary artery without angina pectoris: Secondary | ICD-10-CM

## 2016-04-01 DIAGNOSIS — I951 Orthostatic hypotension: Secondary | ICD-10-CM

## 2016-04-01 DIAGNOSIS — Z8673 Personal history of transient ischemic attack (TIA), and cerebral infarction without residual deficits: Secondary | ICD-10-CM

## 2016-04-01 DIAGNOSIS — E785 Hyperlipidemia, unspecified: Secondary | ICD-10-CM

## 2016-04-01 DIAGNOSIS — J849 Interstitial pulmonary disease, unspecified: Secondary | ICD-10-CM

## 2016-04-01 MED ORDER — CLOPIDOGREL BISULFATE 75 MG PO TABS: 75.0000 mg | ORAL_TABLET | Freq: Every day | ORAL | Status: DC

## 2016-04-01 NOTE — Progress Notes (Signed)
Cardiology Office Note Date:  04/01/2016  Patient ID:  [name], Erica 11-Aug-1931, MRN [id number] PCP:  Kirk Ruths., MD  Cardiologist:  Dr. Rockey Situ, MD    Chief Complaint: Hospital follow up for TIA  History of Present Illness: Erica [name] is a 82 y.o. female with history of CAD s/p remote stenting to RCA, recent TIA/CVA at the end of April 2017, combined IC/NICM s/p BiV ICD with generator change out on 04/05/2015 by Dr. Caryl Comes, MD (originally placed at [facility]), chronic combined CHF with NYHA class 2-3, LBBB, HTN, HLD, frequent falls, dizziness, left-eye blindness, and chronic ILD who presents for hospital follow up for admission to [facility] for TIA/CVA from 4/26-4/28/17.   Last cardiac cath from 2014 showed mild to moderate proximal to mid LAD stenosis, moderate proximal and distal RCA disease estimated at 60% without change from prior cardiac cath in 2009, EF >55%. Medical management was advised. She last underwent ischemic evaluation via nuclear stress testing in 01/2015 that showed no significant ischemia or WMA, EF 19%. Follow up echo on 02/02/2015 showed an EF of 35-40%, GR1DD, mild to moderate AI, mild MR, PASP 65 mm hg. Prior echo from 2011 showed an EF of 43%, mildly dilated LA, mild pulmonary HTN at 33 mm Hg. She takes Lasix prn, though was initially started on this with dosing of every other day. She has had issues with dizziness and increasing falls lately. They have appeared to be with standing. BP has been documented as stable during those times with a SBP not dropping lower than 110 mm Hg. It was felt her falls and dizziness were related to "anxiety" upon standing. She was seen in the ED in March for UTI and near syncope. Head CT was negative. She has scheduled neurology follow up.   Patient presented to [facility] on 4/26 with right-sided weakness, numbness, and slurred speech. Symptoms were resolving upon EMS arrival. Head Ct was negative x 2, as she was unable to undergo MRI brain  given her ICD. tPA was not given as her symptoms had resolved by the time she arrived to [facility]. Troponin was found to be mildly elevated with a peak value of 0.59. BNP 557. LDL 81. Echo was performed on 03/12/16 that showed EF 30-35%, moderate concentric hypertrophy, mild AI, moderate MR, mild biatrial enlargement, moderate TR. Bilateral carotid ultrasound was negative for occlusive PAD. She was seen by neurology, Plavix was added to her daily aspirin.   Since her hospital admission, she has continued to note increased strength, less leg cramping, and less cough. She was taken off her Coreg at hospital discharge by primary team and both the patient and her daughter feel like this has helped her overall functional capacity. She has since been discharged from [facility] and is back home. She has not been taking Plavix since her discharge as she was unaware she had a prescription for this medication in her possession. She has not had any further symptoms concerning for TIA. No falls. Blood pressure at home has been in the [id number] to [id number] systolic. No chest pain. She feels like her breathing is better off Coreg. Weight has been stable. Good appetite.     Past Medical History  Diagnosis Date  . Nonischemic cardiomyopathy ([place])     a. s/p Medtronic CRT-D, NYHA II-III at baseline; b. echo 2011: EF 43%, mildly dilated LA, PASP 55mm Hg; c. echo 3/16: EF 35-40%, GR1DD, mild to mod AI, mild MR, PASP 65 mmHg  . Coronary  artery disease     a. s/p prior stent-RCA; b. cath 6/14: mild to mod prox to mid LAD dz, mod prox to dRCA at 60% unchanged from prior cath; c. nuc 3/16: no sig ischemia, nl WM, EF 19%  . History of MI (myocardial infarction)   . Dyslipidemia   . Hypertension   . COPD (chronic obstructive pulmonary disease) ([place])   . Hypothyroidism   . Basal cell carcinoma 2011  . Respiratory infection   . Vitamin D deficiency   . Breast cancer ([place]) 30+ yrs. ago    right breast, radiation  . TIA (transient  ischemic attack) 02/2016    a. negative head CT x 2, unable to have MRI brain given ICD; b. carotid doppler with 0-49% stenosis bilaterally with antegrade flow bilaterally   . Blind left eye   . Frequent falls   . LBBB (left bundle branch block)     Past Surgical History  Procedure Laterality Date  . Cardiac defibrillator placement    . Insert / replace / remove pacemaker    . Mastectomy    . Abdominal hysterectomy    . Basal cell carcinoma excision      Monh's procedure  . Cardiac catheterization  2014  . Ep implantable device N/A 04/05/2015    Procedure: BIV ICD Generator Changeout;  Surgeon: Deboraha Sprang, MD;  Location: [facility];  Service: Cardiovascular;  Laterality: N/A;  . Breast excisional biopsy Right 30+yrs ago    positive    Current Outpatient Prescriptions  Medication Sig Dispense Refill  . albuterol (PROVENTIL HFA;VENTOLIN HFA) 108 (90 BASE) MCG/ACT inhaler Inhale 2 puffs into the lungs every 6 (six) hours as needed for wheezing or shortness of breath. (Patient taking differently: Inhale 1 puff into the lungs every 6 (six) hours as needed for wheezing or shortness of breath. ) 1 Inhaler 0  . Ascorbic Acid (VITAMIN C) 500 MG tablet Take 500 mg by mouth daily at 12 noon.     Marland Kitchen aspirin 81 MG chewable tablet Chew 1 tablet (81 mg total) by mouth daily. 30 tablet 2  . docusate sodium (COLACE) 100 MG capsule Take 1 capsule (100 mg total) by mouth 2 (two) times daily. 10 capsule 0  . Fluticasone-Salmeterol (ADVAIR DISKUS) 250-50 MCG/DOSE AEPB Inhale 1 puff into the lungs 2 (two) times daily. (Patient taking differently: Inhale 1 puff into the lungs daily. ) 60 each 1  . levothyroxine (SYNTHROID, LEVOTHROID) 75 MCG tablet Take 75 mcg by mouth daily before breakfast.     . LORazepam (ATIVAN) 1 MG tablet Take 1 tablet (1 mg total) by mouth at bedtime as needed for anxiety or sleep. 20 tablet 0  . Multiple Vitamins-Minerals (MULTIVITAMIN GUMMIES ADULT PO) Take 1 each by mouth  daily at 12 noon.     . pravastatin (PRAVACHOL) 10 MG tablet Take 10 mg by mouth at bedtime.   0  . vitamin B-12 (CYANOCOBALAMIN) 1000 MCG tablet Take 1,000 mcg by mouth daily at 12 noon.     . clopidogrel (PLAVIX) 75 MG tablet Take 1 tablet (75 mg total) by mouth daily. 30 tablet 2   No current facility-administered medications for this visit.    Allergies:   Ciprofloxacin; Ace inhibitors; Amlodipine-atorvastatin; Amoxicillin; and Quinolones   Social History:  The patient  reports that she has quit smoking. She has never used smokeless tobacco. She reports that she does not drink alcohol or use illicit drugs.   Family History:  The  patient's family history includes Cancer in her other; Coronary artery disease in her other; Hypertension in her father, mother, and other; Stroke in her father and mother.  ROS:   Review of Systems  Constitutional: Positive for malaise/fatigue. Negative for fever, chills, weight loss and diaphoresis.       Improving fatigue and generalized weakness  HENT: Negative for congestion.   Eyes: Negative for discharge and redness.  Respiratory: Positive for cough and shortness of breath. Negative for hemoptysis, sputum production and wheezing.        Improving cough and SOB  Cardiovascular: Negative for chest pain, palpitations, orthopnea, claudication, leg swelling and PND.  Gastrointestinal: Negative for heartburn, nausea, vomiting and abdominal pain.  Musculoskeletal: Positive for myalgias. Negative for back pain, joint pain, falls and neck pain.  Skin: Negative for rash.  Neurological: Positive for weakness. Negative for dizziness, tingling, tremors, sensory change, speech change, focal weakness and loss of consciousness.  Endo/Heme/Allergies: Does not bruise/bleed easily.  Psychiatric/Behavioral: Negative for substance abuse. The patient is nervous/anxious.   All other systems reviewed and are negative.   PHYSICAL EXAM:  VS:  BP 118/76 mmHg  Pulse 96  Ht  5\' 2"  (1.575 m)  Wt 139 lb 8 oz (63.277 kg)  BMI 25.51 kg/m2 BMI: Body mass index is 25.51 kg/(m^2). Well nourished, well developed, in no acute distress HEENT: normocephalic, atraumatic Neck: no JVD, carotid bruits or masses Cardiac:  normal S1, S2; RRR; II/VI diastolic murmur LUSB, no rubs, or gallops Lungs: coarse breath sounds bilaterally, o/w no acute breath sounds Abd: soft, nontender, no hepatomegaly, + BS MS: no deformity or atrophy Ext: no edema Skin: warm and dry, no rash Neuro:  moves all extremities spontaneously, no focal abnormalities noted, follows commands Psych: euthymic mood, full affect   EKG:  Was ordered and interpreted by me today.  Shows paced rhythm, 96 bpm  Recent Labs: 03/13/2016: B Natriuretic Peptide 557.0*; Hemoglobin 11.9*; Platelets 217 03/14/2016: BUN 18; Creatinine, Ser 1.25*; Potassium 4.3; Sodium 135  03/13/2016: Cholesterol 149; HDL 45; LDL Cholesterol 81; Total CHOL/HDL Ratio 3.3; Triglycerides 115; VLDL 23   Estimated Creatinine Clearance: 29.8 mL/min (by C-G formula based on Cr of 1.25).   Wt Readings from Last 3 Encounters:  04/01/16 139 lb 8 oz (63.277 kg)  03/12/16 140 lb 3.2 oz (63.594 kg)  02/08/16 147 lb (66.679 kg)     Other studies reviewed: Additional studies/records reviewed today include: summarized above  ASSESSMENT AND PLAN:  1. Chronic combined CHF/NICM/ICM s/p BiV ICD as above: She does not appear to be volume overloaded on exam today. She has been off Coreg since her hospital discharge and reports feeling much better of this medication from a strength, breathing, and overall function capacity standpoint. The patient's daughter would prefer for the patient to stay off this medication at this time as she is no longer having blood pressure drops with positional changes. I discussed the importance of Coreg in the patient's medical condition with her cardiomyopathy. They would like to have a trial of no beta blocker until they see Dr.  Caryl Comes, MD in one month. Not on Lasix at this time given her history of soft blood pressure. Given her history of elevated right-sided pressure, I would have a low threshold to start this with KCl repletion. However, given she is overall much improved today and both the patient and daughter are fixated on her history of soft blood pressure I will not start this at this time. Discussed the importance  of a heart healthy diet.   2. History of TIA: No symptoms concerning for subsequent events. Was advised to take Plavix with aspirin in the hospital. Had been receiving both aspirin and Plavix wt [facility] and was discharged on Plavix (paper Rx). She was unaware of this prescription in her possession. She will start Plavix along with aspirin for now. Defer further management of these medications to neurology.   3. CAD s/p prior stenting as above/history of elevated troponin: No symptoms concerning for angina. Discussed the elevated troponin level with her and her daughter today in detail in the setting of neurologic presentation. Recommend Lexiscan Myoview to evaluate for high risk ischemia. Patient's daughter prefers this be pushed out until late June given [place]es[place] schedule. Continue aspirin and Plavix as above. Not on beta blocker as above. Continue pravastatin as below.    4. Orthostatic hypotension: Much improved off Coreg and losartan. Change positions slowly.   5. Frequent falls/generalized weakness: Much improved since her hospital discharge. Continues to gain strength on a daily basis. Walker as directed. Trial of no Coreg as above per patient and patient's daughter request.   6. HLD: On pravastatin. Perhaps the statin is leading to some of her cramps. Consider discontinuation if her cramps worsen, though for now given they are improving while still on statin I will continue this medication. Lipid from 08/2015 at PCP office with LDL of 75. Needs FLP with LFT's. If still not at goal of <70 consider  hight intensity statin.   7. ILD: Stable. On Adviar Diskus bid dosing per pulmonology. Has not had to use her albuterol inhaler. Follow up with pulmonary medicine as directed.   8. History of acute on CKD stage II: Recommend bmet. Patient has appointment with her PCP at 3 PM today. Given time constraints I have asked that this be drawn at PCP office and I can follow up.   Disposition: F/u with Dr. Rockey Situ, MD in 3 months and Dr. Caryl Comes, MD at her previously scheduled appointment.   Current medicines are reviewed at length with the patient today.  The patient did not have any concerns regarding medicines.   Melvern Banker PA-C 04/01/2016 3:16 PM     [place] [facility] [place] [place] [address] [phone]

## 2016-04-01 NOTE — Patient Instructions (Signed)
Medication Instructions:  Your physician has recommended you make the following change in your medication:  1. Resume Plavix 75 mg   Labwork: None ordered  Testing/Procedures: None ordered  Follow-Up: Your physician recommends that you schedule a follow-up appointment in: 3 months with Dr. Rockey Situ.  Date & Time: ______________________________________________________________  Keep appointment with Dr. Lequita Halt   Any Other Special Instructions Will Be Listed Below (If Applicable).     If you need a refill on your cardiac medications before your next appointment, please call your pharmacy.

## 2016-04-10 ENCOUNTER — Inpatient Hospital Stay
Admission: EM | Admit: 2016-04-10 | Discharge: 2016-04-15 | DRG: 689 | Disposition: A | Discharge status: Skilled Nursing Facility | Payer: PPO | Attending: Internal Medicine | Admitting: Internal Medicine

## 2016-04-10 ENCOUNTER — Encounter: Payer: Self-pay | Admitting: Emergency Medicine

## 2016-04-10 ENCOUNTER — Emergency Department: Payer: PPO

## 2016-04-10 DIAGNOSIS — E039 Hypothyroidism, unspecified: Secondary | ICD-10-CM | POA: Diagnosis not present

## 2016-04-10 DIAGNOSIS — Z7982 Long term (current) use of aspirin: Secondary | ICD-10-CM | POA: Diagnosis not present

## 2016-04-10 DIAGNOSIS — E222 Syndrome of inappropriate secretion of antidiuretic hormone: Secondary | ICD-10-CM | POA: Diagnosis present

## 2016-04-10 DIAGNOSIS — Z886 Allergy status to analgesic agent status: Secondary | ICD-10-CM | POA: Diagnosis not present

## 2016-04-10 DIAGNOSIS — Z823 Family history of stroke: Secondary | ICD-10-CM | POA: Diagnosis not present

## 2016-04-10 DIAGNOSIS — N390 Urinary tract infection, site not specified: Principal | ICD-10-CM | POA: Diagnosis present

## 2016-04-10 DIAGNOSIS — R0789 Other chest pain: Secondary | ICD-10-CM | POA: Diagnosis not present

## 2016-04-10 DIAGNOSIS — Z9889 Other specified postprocedural states: Secondary | ICD-10-CM

## 2016-04-10 DIAGNOSIS — N12 Tubulo-interstitial nephritis, not specified as acute or chronic: Secondary | ICD-10-CM

## 2016-04-10 DIAGNOSIS — Z85828 Personal history of other malignant neoplasm of skin: Secondary | ICD-10-CM | POA: Diagnosis not present

## 2016-04-10 DIAGNOSIS — I110 Hypertensive heart disease with heart failure: Secondary | ICD-10-CM | POA: Diagnosis present

## 2016-04-10 DIAGNOSIS — I2510 Atherosclerotic heart disease of native coronary artery without angina pectoris: Secondary | ICD-10-CM | POA: Diagnosis present

## 2016-04-10 DIAGNOSIS — Z87891 Personal history of nicotine dependence: Secondary | ICD-10-CM | POA: Diagnosis not present

## 2016-04-10 DIAGNOSIS — J9601 Acute respiratory failure with hypoxia: Secondary | ICD-10-CM | POA: Diagnosis present

## 2016-04-10 DIAGNOSIS — R296 Repeated falls: Secondary | ICD-10-CM | POA: Diagnosis not present

## 2016-04-10 DIAGNOSIS — I951 Orthostatic hypotension: Secondary | ICD-10-CM | POA: Diagnosis not present

## 2016-04-10 DIAGNOSIS — Z888 Allergy status to other drugs, medicaments and biological substances status: Secondary | ICD-10-CM

## 2016-04-10 DIAGNOSIS — Z853 Personal history of malignant neoplasm of breast: Secondary | ICD-10-CM | POA: Diagnosis not present

## 2016-04-10 DIAGNOSIS — Z79899 Other long term (current) drug therapy: Secondary | ICD-10-CM

## 2016-04-10 DIAGNOSIS — Z880 Allergy status to penicillin: Secondary | ICD-10-CM | POA: Diagnosis not present

## 2016-04-10 DIAGNOSIS — I429 Cardiomyopathy, unspecified: Secondary | ICD-10-CM | POA: Diagnosis not present

## 2016-04-10 DIAGNOSIS — I5022 Chronic systolic (congestive) heart failure: Secondary | ICD-10-CM | POA: Diagnosis not present

## 2016-04-10 DIAGNOSIS — E860 Dehydration: Secondary | ICD-10-CM | POA: Diagnosis present

## 2016-04-10 DIAGNOSIS — Z881 Allergy status to other antibiotic agents status: Secondary | ICD-10-CM

## 2016-04-10 DIAGNOSIS — E871 Hypo-osmolality and hyponatremia: Secondary | ICD-10-CM | POA: Diagnosis present

## 2016-04-10 DIAGNOSIS — I517 Cardiomegaly: Secondary | ICD-10-CM | POA: Diagnosis not present

## 2016-04-10 DIAGNOSIS — B9620 Unspecified Escherichia coli [E. coli] as the cause of diseases classified elsewhere: Secondary | ICD-10-CM | POA: Diagnosis present

## 2016-04-10 DIAGNOSIS — I252 Old myocardial infarction: Secondary | ICD-10-CM | POA: Diagnosis not present

## 2016-04-10 DIAGNOSIS — J440 Chronic obstructive pulmonary disease with acute lower respiratory infection: Secondary | ICD-10-CM | POA: Diagnosis not present

## 2016-04-10 DIAGNOSIS — Z8673 Personal history of transient ischemic attack (TIA), and cerebral infarction without residual deficits: Secondary | ICD-10-CM | POA: Diagnosis not present

## 2016-04-10 DIAGNOSIS — Z90710 Acquired absence of both cervix and uterus: Secondary | ICD-10-CM | POA: Diagnosis not present

## 2016-04-10 DIAGNOSIS — H5442 Blindness, left eye, normal vision right eye: Secondary | ICD-10-CM | POA: Diagnosis not present

## 2016-04-10 DIAGNOSIS — Z7951 Long term (current) use of inhaled steroids: Secondary | ICD-10-CM

## 2016-04-10 DIAGNOSIS — J449 Chronic obstructive pulmonary disease, unspecified: Secondary | ICD-10-CM | POA: Diagnosis present

## 2016-04-10 DIAGNOSIS — Z8249 Family history of ischemic heart disease and other diseases of the circulatory system: Secondary | ICD-10-CM

## 2016-04-10 DIAGNOSIS — J189 Pneumonia, unspecified organism: Secondary | ICD-10-CM | POA: Diagnosis present

## 2016-04-10 DIAGNOSIS — Z9010 Acquired absence of unspecified breast and nipple: Secondary | ICD-10-CM

## 2016-04-10 DIAGNOSIS — I10 Essential (primary) hypertension: Secondary | ICD-10-CM | POA: Diagnosis present

## 2016-04-10 DIAGNOSIS — R0902 Hypoxemia: Secondary | ICD-10-CM

## 2016-04-10 HISTORY — DX: Heart failure, unspecified: I50.9

## 2016-04-10 LAB — BASIC METABOLIC PANEL
ANION GAP: 8 (ref 5–15)
BUN: 20 mg/dL (ref 6–20)
CALCIUM: 8.3 mg/dL — AB (ref 8.9–10.3)
CO2: 24 mmol/L (ref 22–32)
CREATININE: 1.12 mg/dL — AB (ref 0.44–1.00)
Chloride: 90 mmol/L — ABNORMAL LOW (ref 101–111)
GFR, EST AFRICAN AMERICAN: 51 mL/min — AB (ref >60)
GFR, EST NON AFRICAN AMERICAN: 44 mL/min — AB (ref >60)
Glucose, Bld: 131 mg/dL — ABNORMAL HIGH (ref 65–99)
Potassium: 4.4 mmol/L (ref 3.5–5.1)
SODIUM: 122 mmol/L — AB (ref 135–145)

## 2016-04-10 LAB — URINALYSIS COMPLETE WITH MICROSCOPIC (ARMC ONLY)
Bilirubin Urine: NEGATIVE
GLUCOSE, UA: NEGATIVE mg/dL
HGB URINE DIPSTICK: NEGATIVE
KETONES UR: NEGATIVE mg/dL
NITRITE: POSITIVE — AB
Protein, ur: 30 mg/dL — AB
SPECIFIC GRAVITY, URINE: 1.014 (ref 1.005–1.030)
Trans Epithel, UA: 1
pH: 5 (ref 5.0–8.0)

## 2016-04-10 LAB — CBC
HCT: 33 % — ABNORMAL LOW (ref 35.0–47.0)
HEMOGLOBIN: 11.5 g/dL — AB (ref 12.0–16.0)
MCH: 30.6 pg (ref 26.0–34.0)
MCHC: 34.7 g/dL (ref 32.0–36.0)
MCV: 88 fL (ref 80.0–100.0)
PLATELETS: 199 10*3/uL (ref 150–440)
RBC: 3.75 MIL/uL — AB (ref 3.80–5.20)
RDW: 15.2 % — ABNORMAL HIGH (ref 11.5–14.5)
WBC: 16 10*3/uL — AB (ref 3.6–11.0)

## 2016-04-10 MED ORDER — ONDANSETRON HCL 4 MG/2ML IJ SOLN: 4.0000 mg | Freq: Four times a day (QID) | INTRAMUSCULAR | Status: DC | PRN

## 2016-04-10 MED ORDER — PRAVASTATIN SODIUM 10 MG PO TABS
10.0000 mg | ORAL_TABLET | Freq: Every day | ORAL | Status: DC
  Administered 2016-04-11 – 2016-04-14 (×5): 10 mg via ORAL
  Filled 2016-04-10 (×5): qty 1

## 2016-04-10 MED ORDER — ASPIRIN 81 MG PO CHEW
81.0000 mg | CHEWABLE_TABLET | Freq: Every day | ORAL | Status: DC
  Administered 2016-04-11 – 2016-04-15 (×5): 81 mg via ORAL
  Filled 2016-04-10 (×5): qty 1

## 2016-04-10 MED ORDER — SODIUM CHLORIDE 0.9 % IV SOLN
INTRAVENOUS | Status: AC
  Administered 2016-04-11: via INTRAVENOUS

## 2016-04-10 MED ORDER — CLOPIDOGREL BISULFATE 75 MG PO TABS
75.0000 mg | ORAL_TABLET | Freq: Every day | ORAL | Status: DC
  Administered 2016-04-11 – 2016-04-15 (×5): 75 mg via ORAL
  Filled 2016-04-10 (×5): qty 1

## 2016-04-10 MED ORDER — MOMETASONE FURO-FORMOTEROL FUM 200-5 MCG/ACT IN AERO
2.0000 | INHALATION_SPRAY | Freq: Two times a day (BID) | RESPIRATORY_TRACT | Status: DC
  Administered 2016-04-11 – 2016-04-15 (×10): 2 via RESPIRATORY_TRACT
  Filled 2016-04-10: qty 8.8

## 2016-04-10 MED ORDER — DEXTROSE 5 % IV SOLN
1.0000 g | Freq: Once | INTRAVENOUS | Status: AC
  Administered 2016-04-10: 1 g via INTRAVENOUS
  Filled 2016-04-10: qty 10

## 2016-04-10 MED ORDER — SODIUM CHLORIDE 0.9% FLUSH
3.0000 mL | Freq: Two times a day (BID) | INTRAVENOUS | Status: DC
  Administered 2016-04-11 – 2016-04-14 (×8): 3 mL via INTRAVENOUS

## 2016-04-10 MED ORDER — ACETAMINOPHEN 650 MG RE SUPP: 650.0000 mg | Freq: Four times a day (QID) | RECTAL | Status: DC | PRN

## 2016-04-10 MED ORDER — HYDROCODONE-ACETAMINOPHEN 5-325 MG PO TABS
ORAL_TABLET | ORAL | Status: AC
  Administered 2016-04-10: 1 via ORAL
  Filled 2016-04-10: qty 1

## 2016-04-10 MED ORDER — LEVOTHYROXINE SODIUM 75 MCG PO TABS
75.0000 ug | ORAL_TABLET | Freq: Every day | ORAL | Status: DC
  Administered 2016-04-11 – 2016-04-15 (×5): 75 ug via ORAL
  Filled 2016-04-10 (×5): qty 1

## 2016-04-10 MED ORDER — LORAZEPAM 1 MG PO TABS
1.0000 mg | ORAL_TABLET | Freq: Every evening | ORAL | Status: DC | PRN
Start: 2016-04-10 — End: 2016-04-15
  Administered 2016-04-12 – 2016-04-14 (×4): 1 mg via ORAL
  Filled 2016-04-10 (×4): qty 1

## 2016-04-10 MED ORDER — HYDROCODONE-ACETAMINOPHEN 5-325 MG PO TABS
1.0000 | ORAL_TABLET | ORAL | Status: DC | PRN
  Administered 2016-04-11: 1 via ORAL
  Filled 2016-04-10: qty 1

## 2016-04-10 MED ORDER — ENOXAPARIN SODIUM 40 MG/0.4ML ~~LOC~~ SOLN
40.0000 mg | Freq: Every day | SUBCUTANEOUS | Status: DC
  Administered 2016-04-11 – 2016-04-14 (×5): 40 mg via SUBCUTANEOUS
  Filled 2016-04-10 (×5): qty 0.4

## 2016-04-10 MED ORDER — HYDROCODONE-ACETAMINOPHEN 5-325 MG PO TABS
1.0000 | ORAL_TABLET | Freq: Once | ORAL | Status: AC
  Administered 2016-04-10: 1 via ORAL

## 2016-04-10 MED ORDER — SODIUM CHLORIDE 0.9 % IV BOLUS (SEPSIS)
500.0000 mL | Freq: Once | INTRAVENOUS | Status: AC
  Administered 2016-04-10: 500 mL via INTRAVENOUS

## 2016-04-10 MED ORDER — PNEUMOCOCCAL VAC POLYVALENT 25 MCG/0.5ML IJ INJ
0.5000 mL | INJECTION | INTRAMUSCULAR | Status: DC
  Filled 2016-04-10: qty 0.5

## 2016-04-10 MED ORDER — ONDANSETRON HCL 4 MG PO TABS: 4.0000 mg | ORAL_TABLET | Freq: Four times a day (QID) | ORAL | Status: DC | PRN

## 2016-04-10 MED ORDER — DEXTROSE 5 % IV SOLN
1.0000 g | INTRAVENOUS | Status: DC
  Filled 2016-04-10: qty 10

## 2016-04-10 MED ORDER — ALBUTEROL SULFATE (2.5 MG/3ML) 0.083% IN NEBU
3.0000 mL | INHALATION_SOLUTION | Freq: Four times a day (QID) | RESPIRATORY_TRACT | Status: DC | PRN
  Administered 2016-04-11 – 2016-04-13 (×5): 3 mL via RESPIRATORY_TRACT
  Filled 2016-04-10 (×6): qty 3

## 2016-04-10 MED ORDER — ACETAMINOPHEN 325 MG PO TABS
650.0000 mg | ORAL_TABLET | Freq: Four times a day (QID) | ORAL | Status: DC | PRN
  Administered 2016-04-11 (×2): 650 mg via ORAL
  Filled 2016-04-10 (×2): qty 2

## 2016-04-10 NOTE — ED Notes (Signed)
Pt presents to ED with reports of weakness, fever and dysuria for two days. Pt was recently discharged from [facility] two weeks ago. Pt reports nausea and a little diarrhea.

## 2016-04-10 NOTE — H&P (Signed)
[facility] NAME: Erica [name]    MR#:  [id number]  DATE OF BIRTH:  20-Oct-1931    DATE OF ADMISSION:  04/10/2016  PRIMARY CARE PHYSICIAN: Kirk Ruths., MD   REQUESTING/REFERRING PHYSICIAN: Robet Leu, MD  CHIEF COMPLAINT:   Chief Complaint  Patient presents with  . Weakness  . Dysuria    HISTORY OF PRESENT ILLNESS:  Erica [name]  is a 82 y.o. female who presents with Weakness, malaise, and orthostatic hypotension. On evaluation in the ED patient was found to have UTI with nitrites positive on her UA. She has an elevated white blood cell count, and hyponatremia which is new. Patient endorses some left flank pain, raising the question for possible pyelonephritis. Hospitalists were called for admission.  PAST MEDICAL HISTORY:   Past Medical History  Diagnosis Date  . Nonischemic cardiomyopathy ([place])     a. s/p Medtronic CRT-D, NYHA II-III at baseline; b. echo 2011: EF 43%, mildly dilated LA, PASP 58mm Hg; c. echo 3/16: EF 35-40%, GR1DD, mild to mod AI, mild MR, PASP 65 mmHg  . Coronary artery disease     a. s/p prior stent-RCA; b. cath 6/14: mild to mod prox to mid LAD dz, mod prox to dRCA at 60% unchanged from prior cath; c. nuc 3/16: no sig ischemia, nl WM, EF 19%  . History of MI (myocardial infarction)   . Dyslipidemia   . Hypertension   . COPD (chronic obstructive pulmonary disease) ([place])   . Hypothyroidism   . Basal cell carcinoma 2011  . Respiratory infection   . Vitamin D deficiency   . Breast cancer ([place]) 30+ yrs. ago    right breast, radiation  . TIA (transient ischemic attack) 02/2016    a. negative head CT x 2, unable to have MRI brain given ICD; b. carotid doppler with 0-49% stenosis bilaterally with antegrade flow bilaterally   . Blind left eye   . Frequent falls   . LBBB (left bundle branch block)   . CHF (congestive heart failure) ([place])     PAST SURGICAL HISTORY:   Past Surgical History   Procedure Laterality Date  . Cardiac defibrillator placement    . Insert / replace / remove pacemaker    . Mastectomy    . Abdominal hysterectomy    . Basal cell carcinoma excision      Monh's procedure  . Cardiac catheterization  2014  . Ep implantable device N/A 04/05/2015    Procedure: BIV ICD Generator Changeout;  Surgeon: Deboraha Sprang, MD;  Location: [facility];  Service: Cardiovascular;  Laterality: N/A;  . Breast excisional biopsy Right 30+yrs ago    positive    SOCIAL HISTORY:   Social History  Substance Use Topics  . Smoking status: Former Research scientist (life sciences)  . Smokeless tobacco: Never Used  . Alcohol Use: No    FAMILY HISTORY:   Family History  Problem Relation Age of Onset  . Cancer Other   . Coronary artery disease Other   . Hypertension Other   . Hypertension Mother   . Stroke Mother   . Hypertension Father   . Stroke Father     DRUG ALLERGIES:   Allergies  Allergen Reactions  . Ciprofloxacin Anaphylaxis and Hives  . Ace Inhibitors Other (See Comments)    Reaction:  Unknown   . Amlodipine-Atorvastatin Other (See Comments)    Reaction:  Unknown   . Atorvastatin Other (See Comments)  Muscle cramps  . Amoxicillin Itching, Rash and Other (See Comments)    Has patient had a PCN reaction causing immediate rash, facial/tongue/throat swelling, SOB or lightheadedness with hypotension: No Has patient had a PCN reaction causing severe rash involving mucus membranes or skin necrosis: No Has patient had a PCN reaction that required hospitalization No Has patient had a PCN reaction occurring within the last 10 years: Yes If all of the above answers are "NO", then may proceed with Cephalosporin use.  . Quinolones Itching and Rash    MEDICATIONS AT HOME:   Prior to Admission medications   Medication Sig Start Date End Date Taking? Authorizing Provider  albuterol (PROVENTIL HFA;VENTOLIN HFA) 108 (90 BASE) MCG/ACT inhaler Inhale 2 puffs into the lungs every 6  (six) hours as needed for wheezing or shortness of breath. Patient taking differently: Inhale 1 puff into the lungs every 6 (six) hours as needed for wheezing or shortness of breath.  12/28/13   Roger A Arguello, PA-C  Ascorbic Acid (VITAMIN C) 500 MG tablet Take 500 mg by mouth daily at 12 noon.     Historical Provider, MD  aspirin 81 MG chewable tablet Chew 1 tablet (81 mg total) by mouth daily. 03/14/16   Gladstone Lighter, MD  clopidogrel (PLAVIX) 75 MG tablet Take 1 tablet (75 mg total) by mouth daily. 04/01/16   Rise Mu, PA-C  docusate sodium (COLACE) 100 MG capsule Take 1 capsule (100 mg total) by mouth 2 (two) times daily. 03/14/16   Gladstone Lighter, MD  Fluticasone-Salmeterol (ADVAIR DISKUS) 250-50 MCG/DOSE AEPB Inhale 1 puff into the lungs 2 (two) times daily. Patient taking differently: Inhale 1 puff into the lungs daily.  03/14/16   Gladstone Lighter, MD  levothyroxine (SYNTHROID, LEVOTHROID) 75 MCG tablet Take 75 mcg by mouth daily before breakfast.     Historical Provider, MD  LORazepam (ATIVAN) 1 MG tablet Take 1 tablet (1 mg total) by mouth at bedtime as needed for anxiety or sleep. 03/14/16   Gladstone Lighter, MD  Multiple Vitamins-Minerals (MULTIVITAMIN GUMMIES ADULT PO) Take 1 each by mouth daily at 12 noon.     Historical Provider, MD  pravastatin (PRAVACHOL) 10 MG tablet Take 10 mg by mouth at bedtime.     Historical Provider, MD  vitamin B-12 (CYANOCOBALAMIN) 1000 MCG tablet Take 1,000 mcg by mouth daily at 12 noon.     Historical Provider, MD    REVIEW OF SYSTEMS:  Review of Systems  Constitutional: Positive for malaise/fatigue. Negative for fever, chills and weight loss.  HENT: Negative for ear pain, hearing loss and tinnitus.   Eyes: Negative for blurred vision, double vision, pain and redness.  Respiratory: Negative for cough, hemoptysis and shortness of breath.   Cardiovascular: Negative for chest pain, palpitations, orthopnea and leg swelling.  Gastrointestinal:  Negative for nausea, vomiting, abdominal pain, diarrhea and constipation.  Genitourinary: Positive for dysuria and flank pain. Negative for frequency and hematuria.  Musculoskeletal: Negative for back pain, joint pain and neck pain.  Skin:       No acne, rash, or lesions  Neurological: Positive for weakness. Negative for dizziness, tremors and focal weakness.  Endo/Heme/Allergies: Negative for polydipsia. Does not bruise/bleed easily.  Psychiatric/Behavioral: Negative for depression. The patient is not nervous/anxious and does not have insomnia.      VITAL SIGNS:   Filed Vitals:   04/10/16 1740 04/10/16 1906 04/10/16 2056  BP: 114/53 143/85 135/66  Pulse: 94 98 98  Temp: 98 F (36.7 C)  TempSrc: Oral    Resp: 18 18 18   Height: 5\' 3"  (1.6 m)    Weight: 63.05 kg (139 lb)    SpO2: 93% 98% 94%   Wt Readings from Last 3 Encounters:  04/10/16 63.05 kg (139 lb)  04/01/16 63.277 kg (139 lb 8 oz)  03/12/16 63.594 kg (140 lb 3.2 oz)    PHYSICAL EXAMINATION:  Physical Exam  Vitals reviewed. Constitutional: She is oriented to person, place, and time. She appears well-developed and well-nourished. No distress.  HENT:  Head: Normocephalic and atraumatic.  Dry mucous membranes  Eyes: Conjunctivae and EOM are normal. Pupils are equal, round, and reactive to light. No scleral icterus.  Neck: Normal range of motion. Neck supple. No JVD present. No thyromegaly present.  Cardiovascular: Regular rhythm and intact distal pulses.  Exam reveals no gallop and no friction rub.   No murmur heard. Borderline tachycardic  Respiratory: Effort normal and breath sounds normal. No respiratory distress. She has no wheezes. She has no rales.  GI: Soft. Bowel sounds are normal. She exhibits no distension. There is tenderness (suprapubic).  Musculoskeletal: Normal range of motion. She exhibits no edema.  No arthritis, no gout  Lymphadenopathy:    She has no cervical adenopathy.  Neurological: She is  alert and oriented to person, place, and time. No cranial nerve deficit.  No dysarthria, no aphasia  Skin: Skin is warm and dry. No rash noted. No erythema.  Psychiatric: She has a normal mood and affect. Her behavior is normal. Judgment and thought content normal.    LABORATORY PANEL:   CBC  Recent Labs Lab 04/10/16 1746  WBC 16.0*  HGB 11.5*  HCT 33.0*  PLT 199   ------------------------------------------------------------------------------------------------------------------  Chemistries   Recent Labs Lab 04/10/16 1746  NA 122*  K 4.4  CL 90*  CO2 24  GLUCOSE 131*  BUN 20  CREATININE 1.12*  CALCIUM 8.3*   ------------------------------------------------------------------------------------------------------------------  Cardiac Enzymes No results for input(s): TROPONINI in the last 168 hours. ------------------------------------------------------------------------------------------------------------------  RADIOLOGY:  Dg Chest Port 1 View  04/10/2016  CLINICAL DATA:  Acute onset of generalized weakness, chest pain, fever and dysuria. Initial encounter. EXAM: PORTABLE CHEST 1 VIEW COMPARISON:  Chest radiograph performed 03/12/2016 FINDINGS: The lungs are hypoexpanded. Patchy left-sided airspace opacity could reflect mild pneumonia. There is no evidence of pleural effusion or pneumothorax. The cardiomediastinal silhouette is borderline enlarged. A pacemaker/AICD is noted overlying the left chest wall, with leads ending overlying the right atrium, right ventricle and coronary sinus. No acute osseous abnormalities are seen. IMPRESSION: Lungs hypoexpanded. Patchy left-sided airspace opacity could reflect mild pneumonia, given the patient's symptoms. Borderline cardiomegaly. Electronically Signed   By: Garald Balding M.D.   On: 04/10/2016 19:35    EKG:   Orders placed or performed during the hospital encounter of 04/10/16  . ED EKG  . ED EKG    IMPRESSION AND PLAN:   Principal Problem:   UTI (lower urinary tract infection) - continue IV antibiotics that were started in the ED, urine culture pending Active Problems:   Hyponatremia - suspect due to UTI, possible pyelonephritis, and dehydration. Hydrate gently with fluids tonight, use caution due to her history of CHF.   Essential hypertension - currently stable, due to orthostatic hypotension we'll hold antihypertensives for now.   Coronary atherosclerosis - continue home meds   Chronic systolic heart failure ([place]) - continue home meds   COPD (chronic obstructive pulmonary disease) ([place]) - continue home inhalers   Hypothyroidism - home  dose thyroid replacement  All the records are reviewed and case discussed with ED provider. Management plans discussed with the patient and/or family.  DVT PROPHYLAXIS: SubQ lovenox  GI PROPHYLAXIS: None  ADMISSION STATUS: Inpatient  CODE STATUS: Full Code Status History    Date Active Date Inactive Code Status Order ID Comments User Context   03/12/2016  4:31 PM 03/14/2016 10:20 PM Full Code [id number]  Loletha Grayer, MD ED    Advance Directive Documentation        Most Recent Value   Type of Advance Directive  Healthcare Power of Attorney   Pre-existing out of facility DNR order (yellow form or pink MOST form)     "MOST" Form in Place?        TOTAL TIME TAKING CARE OF THIS PATIENT: 45 minutes.    Onna Nodal [place] 04/10/2016, 9:38 PM  [facility]  Office  [phone]  CC: Primary care physician; Kirk Ruths., MD

## 2016-04-10 NOTE — ED Notes (Signed)
MD at bedside. 

## 2016-04-10 NOTE — ED Notes (Signed)
MD Willis at bedside. 

## 2016-04-10 NOTE — ED Provider Notes (Signed)
[facility] Emergency Department Provider Note ____________________________________________  Time seen: Approximately 6:50 PM  I have reviewed the triage vital signs and the nursing notes.   HISTORY  Chief Complaint Weakness and Dysuria   HPI Erica [name] is a 82 y.o. female with a history of coronary artery disease, hypertension, COPD, CHF who was admitted at the end of April for a TIA and then went to rehabilitation at [facility] for several weeks; she has been home for 2 weeks and had been doing well until 2 days ago when she developed nausea without vomiting, had one small episode of diarrhea, and was noted to have very dark urine. She had a subjective fever. She felt weak.She has not been able to eat or drink very well.  She endorses suprapubic discomfort & L flank pain.    Last night she also noted that she had some chest discomfort that felt as though her bra was too tight. Her shortness of breath has been getting gradually worse over time. Her daughter does not think there is any acute change.  Past Medical History  Diagnosis Date  . Nonischemic cardiomyopathy ([place])     a. s/p Medtronic CRT-D, NYHA II-III at baseline; b. echo 2011: EF 43%, mildly dilated LA, PASP 27mm Hg; c. echo 3/16: EF 35-40%, GR1DD, mild to mod AI, mild MR, PASP 65 mmHg  . Coronary artery disease     a. s/p prior stent-RCA; b. cath 6/14: mild to mod prox to mid LAD dz, mod prox to dRCA at 60% unchanged from prior cath; c. nuc 3/16: no sig ischemia, nl WM, EF 19%  . History of MI (myocardial infarction)   . Dyslipidemia   . Hypertension   . COPD (chronic obstructive pulmonary disease) ([place])   . Hypothyroidism   . Basal cell carcinoma 2011  . Respiratory infection   . Vitamin D deficiency   . Breast cancer ([place]) 30+ yrs. ago    right breast, radiation  . TIA (transient ischemic attack) 02/2016    a. negative head CT x 2, unable to have MRI brain given ICD; b. carotid  doppler with 0-49% stenosis bilaterally with antegrade flow bilaterally   . Blind left eye   . Frequent falls   . LBBB (left bundle branch block)   . CHF (congestive heart failure) [facility])     Patient Active Problem List   Diagnosis Date Noted  . Hypothyroidism 04/10/2016  . COPD (chronic obstructive pulmonary disease) ([place]) 04/10/2016  . UTI (lower urinary tract infection) 04/10/2016  . Hyponatremia 04/10/2016  . Blind left eye   . Frequent falls   . LBBB (left bundle branch block)   . TIA (transient ischemic attack)   . Chronic systolic CHF (congestive heart failure) ([place])   . Falls   . Leg weakness, bilateral   . Elevated troponin   . CVA (cerebral infarction) 03/12/2016  . Gait instability 01/08/2016  . Chronic systolic heart failure ([place]) 02/02/2015  . Cough 01/18/2014  . Weakness generalized 01/18/2014  . Acute bronchitis 12/28/2013  . Nonischemic cardiomyopathy ([place]) 12/28/2013  . Orthostatic hypotension 04/06/2012  . Biventricular implantable cardioverter-defibrillator in situ 04/04/2011  . HYPERLIPIDEMIA, MIXED 10/23/2009  . Essential hypertension 10/23/2009  . Coronary atherosclerosis 10/23/2009    Past Surgical History  Procedure Laterality Date  . Cardiac defibrillator placement    . Insert / replace / remove pacemaker    . Mastectomy    . Abdominal hysterectomy    . Basal cell  carcinoma excision      Monh's procedure  . Cardiac catheterization  2014  . Ep implantable device N/A 04/05/2015    Procedure: BIV ICD Generator Changeout;  Surgeon: Deboraha Sprang, MD;  Location: [place];  Service: Cardiovascular;  Laterality: N/A;  . Breast excisional biopsy Right 30+yrs ago    positive    No current outpatient prescriptions on file.  Allergies Ciprofloxacin; Ace inhibitors; Amlodipine-atorvastatin; Atorvastatin; Amoxicillin; and Quinolones  Family History  Problem Relation Age of Onset  . Cancer Other   . Coronary artery disease Other   .  Hypertension Other   . Hypertension Mother   . Stroke Mother   . Hypertension Father   . Stroke Father     Social History Social History  Substance Use Topics  . Smoking status: Former Research scientist (life sciences)  . Smokeless tobacco: Never Used  . Alcohol Use: No    Review of Systems Constitutional: See history of present illness Eyes: No visual changes. ENT: No sore throat. Cardiovascular: See history of present illness Respiratory: See history of present illness Gastrointestinal: No abdominal pain.  No nausea, no vomiting.  No diarrhea.  No constipation. Genitourinary: Negative for dysuria. Musculoskeletal: Negative for back pain. Skin: Negative for rash. Neurological: Negative for headaches, focal weakness or numbness.  10-point ROS otherwise negative.  ____________________________________________   PHYSICAL EXAM:  VITAL SIGNS: ED Triage Vitals  Enc Vitals Group     BP 04/10/16 1740 114/53 mmHg     Pulse Rate 04/10/16 1740 94     Resp 04/10/16 1740 18     Temp 04/10/16 1740 98 F (36.7 C)     Temp Source 04/10/16 1740 Oral     SpO2 04/10/16 1740 93 %     Weight 04/10/16 1740 139 lb (63.05 kg)     Height 04/10/16 1740 5\' 3"  (1.6 m)     Head Cir --      Peak Flow --      Pain Score 04/10/16 1740 8     Pain Loc --      Pain Edu? --      Excl. in [place]? --    Constitutional: Alert and oriented. Well appearing and in no acute distress. Eyes: Conjunctivae are normal. PERRL. EOMI. Head: Atraumatic. Nose: No congestion/rhinnorhea. Mouth/Throat: Mucous membranes are moist.  Oropharynx non-erythematous. Neck: No stridor.   Cardiovascular: Normal rate, regular rhythm. Grossly normal heart sounds.  Good peripheral circulation. Respiratory: Normal respiratory effort.  No retractions. Lungs CTAB. Gastrointestinal: Soft and nontender. No distention. No abdominal bruits. No CVA tenderness. Musculoskeletal: No lower extremity tenderness nor edema.   Neurologic:  Normal speech and language.  No gross focal neurologic deficits are appreciated. No gait instability. Skin:  Skin is warm, dry and intact. No rash noted. Psychiatric: Mood and affect are normal. Speech and behavior are normal.  ____________________________________________   LABS (all labs ordered are listed, but only abnormal results are displayed)  Labs Reviewed  BASIC METABOLIC PANEL - Abnormal; Notable for the following:    Sodium 122 (*)    Chloride 90 (*)    Glucose, Bld 131 (*)    Creatinine, Ser 1.12 (*)    Calcium 8.3 (*)    GFR calc non Af Amer 44 (*)    GFR calc Af Amer 51 (*)    All other components within normal limits  CBC - Abnormal; Notable for the following:    WBC 16.0 (*)    RBC 3.75 (*)  Hemoglobin 11.5 (*)    HCT 33.0 (*)    RDW 15.2 (*)    All other components within normal limits  URINALYSIS COMPLETEWITH MICROSCOPIC ([facility] ONLY) - Abnormal; Notable for the following:    Color, Urine YELLOW (*)    APPearance HAZY (*)    Protein, ur 30 (*)    Nitrite POSITIVE (*)    Leukocytes, UA 3+ (*)    Bacteria, UA MANY (*)    Squamous Epithelial / LPF 0-5 (*)    All other components within normal limits  TROPONIN I - Abnormal; Notable for the following:    Troponin I 0.06 (*)    All other components within normal limits  URINE CULTURE  CULTURE, BLOOD (ROUTINE X 2)  CULTURE, BLOOD (ROUTINE X 2)  BASIC METABOLIC PANEL  CBC  TROPONIN I  TROPONIN I  TROPONIN I   ____________________________________________  EKG   Date: 04/10/2016  Rate: 98  Rhythm: Ventricular paced  QRS Axis: normal  Intervals: normal  ST/T Wave abnormalities: normal  Conduction Disutrbances: none  Narrative Interpretation: unremarkable   ____________________________________________  RADIOLOGY  cxr- IMPRESSION: Lungs hypoexpanded. Patchy left-sided airspace opacity could reflect mild pneumonia, given the patient's symptoms. Borderline cardiomegaly.   Electronically Signed By: Garald Balding  M.D. On: 04/10/2016 19:35  ____________________________________________   INITIAL IMPRESSION / ASSESSMENT AND PLAN / ED COURSE  Pertinent labs & imaging results that were available during my care of the patient were reviewed by me and considered in my medical decision making (see chart for details).  Will cover for pyelo with CTX.   ____________________________________________   FINAL CLINICAL IMPRESSION(S) / ED DIAGNOSES  Final diagnoses:  Pyelonephritis  Other chest pain  Hyponatremia      New prescriptions started this visit Current Discharge Medication List       Ponciano Ort, MD 04/11/16 [phone]

## 2016-04-11 DIAGNOSIS — J189 Pneumonia, unspecified organism: Secondary | ICD-10-CM | POA: Diagnosis not present

## 2016-04-11 DIAGNOSIS — N390 Urinary tract infection, site not specified: Secondary | ICD-10-CM | POA: Diagnosis not present

## 2016-04-11 LAB — BASIC METABOLIC PANEL
ANION GAP: 6 (ref 5–15)
BUN: 20 mg/dL (ref 6–20)
CHLORIDE: 94 mmol/L — AB (ref 101–111)
CO2: 22 mmol/L (ref 22–32)
Calcium: 7.9 mg/dL — ABNORMAL LOW (ref 8.9–10.3)
Creatinine, Ser: 1.11 mg/dL — ABNORMAL HIGH (ref 0.44–1.00)
GFR, EST AFRICAN AMERICAN: 52 mL/min — AB (ref >60)
GFR, EST NON AFRICAN AMERICAN: 45 mL/min — AB (ref >60)
Glucose, Bld: 170 mg/dL — ABNORMAL HIGH (ref 65–99)
POTASSIUM: 4 mmol/L (ref 3.5–5.1)
SODIUM: 122 mmol/L — AB (ref 135–145)

## 2016-04-11 LAB — CBC
HEMATOCRIT: 29.4 % — AB (ref 35.0–47.0)
Hemoglobin: 10.2 g/dL — ABNORMAL LOW (ref 12.0–16.0)
MCH: 30.6 pg (ref 26.0–34.0)
MCHC: 34.7 g/dL (ref 32.0–36.0)
MCV: 88 fL (ref 80.0–100.0)
Platelets: 162 10*3/uL (ref 150–440)
RBC: 3.34 MIL/uL — AB (ref 3.80–5.20)
RDW: 14.6 % — AB (ref 11.5–14.5)
WBC: 13.5 10*3/uL — AB (ref 3.6–11.0)

## 2016-04-11 LAB — TROPONIN I
TROPONIN I: 0.06 ng/mL — AB (ref <0.031)
TROPONIN I: 0.04 ng/mL — AB (ref <0.031)
Troponin I: <0.03 ng/mL (ref <0.031)

## 2016-04-11 MED ORDER — GUAIFENESIN 100 MG/5ML PO SOLN
5.0000 mL | ORAL | Status: DC | PRN
  Administered 2016-04-11 – 2016-04-15 (×14): 100 mg via ORAL
  Filled 2016-04-11 (×14): qty 10

## 2016-04-11 MED ORDER — DEXTROSE 5 % IV SOLN
500.0000 mg | INTRAVENOUS | Status: DC
  Administered 2016-04-11 – 2016-04-12 (×2): 500 mg via INTRAVENOUS
  Filled 2016-04-11 (×3): qty 500

## 2016-04-11 MED ORDER — SODIUM CHLORIDE 0.9 % IV SOLN
INTRAVENOUS | Status: AC
  Administered 2016-04-11: 12:00:00 via INTRAVENOUS

## 2016-04-11 NOTE — Progress Notes (Signed)
Called dr Geryl Councilman and made aware of the troponin level of 0.04 called in from the lab.

## 2016-04-11 NOTE — Care Management (Signed)
Admitted to [facility] with the diagnosis of urinary tract infection. Discharged from this facility 03/14/16. Daughter is [name] [phone]). Dr, Frazier Richards is primary care physician. Followed by [facility]. Uses rolling walker to aid in ambulation. Life Alert in the home.  Shelbie Ammons RN MSN CCM Care Management [phone]

## 2016-04-11 NOTE — Progress Notes (Signed)
[facility] [place] NAME: Tyerra Diemert    MR#:  [id number]  DATE OF BIRTH:  03-Jul-1931  SUBJECTIVE:  CHIEF COMPLAINT:   Chief Complaint  Patient presents with  . Weakness  . Dysuria   Cough, chest pain on left back side.  REVIEW OF SYSTEMS:  CONSTITUTIONAL: No fever, fatigue or weakness.  EYES: No blurred or double vision.  EARS, NOSE, AND THROAT: No tinnitus or ear pain.  RESPIRATORY: has cough, no shortness of breath, wheezing or hemoptysis.  CARDIOVASCULAR: has chest pain, no orthopnea, edema.  GASTROINTESTINAL: No nausea, vomiting, diarrhea or abdominal pain.  GENITOURINARY: No dysuria, hematuria.  ENDOCRINE: No polyuria, nocturia,  HEMATOLOGY: No anemia, easy bruising or bleeding SKIN: No rash or lesion. MUSCULOSKELETAL: No joint pain or arthritis.   NEUROLOGIC: No tingling, numbness, weakness.  PSYCHIATRY: No anxiety or depression.   DRUG ALLERGIES:   Allergies  Allergen Reactions  . Ciprofloxacin Anaphylaxis and Hives  . Ace Inhibitors Other (See Comments)    Reaction:  Unknown   . Amlodipine-Atorvastatin Other (See Comments)    Reaction:  Unknown   . Atorvastatin Other (See Comments)    Muscle cramps  . Amoxicillin Itching, Rash and Other (See Comments)    Has patient had a PCN reaction causing immediate rash, facial/tongue/throat swelling, SOB or lightheadedness with hypotension: No Has patient had a PCN reaction causing severe rash involving mucus membranes or skin necrosis: No Has patient had a PCN reaction that required hospitalization No Has patient had a PCN reaction occurring within the last 10 years: Yes If all of the above answers are "NO", then may proceed with Cephalosporin use.  . Quinolones Itching and Rash    VITALS:  Blood pressure 114/50, pulse 83, temperature 97.6 F (36.4 C), temperature source Oral, resp. rate 18, height 5\' 3"  (1.6 m), weight 142 lb 3.2 oz (64.501 kg), SpO2 96  %.  PHYSICAL EXAMINATION:  GENERAL:  82 y.o.-year-old patient lying in the bed with no acute distress.  EYES: Pupils equal, round, reactive to light and accommodation. No scleral icterus. Extraocular muscles intact.  HEENT: Head atraumatic, normocephalic. Oropharynx and nasopharynx clear.  NECK:  Supple, no jugular venous distention. No thyroid enlargement, no tenderness.  LUNGS: Normal breath sounds bilaterally, no wheezing, rales,rhonchi or crepitation. No use of accessory muscles of respiration.  CARDIOVASCULAR: S1, S2 normal. No murmurs, rubs, or gallops.  ABDOMEN: Soft, nontender, nondistended. Bowel sounds present. No organomegaly or mass.  EXTREMITIES: No pedal edema, cyanosis, or clubbing.  NEUROLOGIC: Cranial nerves II through XII are intact. Muscle strength 5/5 in all extremities. Sensation intact. Gait not checked.  PSYCHIATRIC: The patient is alert and oriented x 3.  SKIN: No obvious rash, lesion, or ulcer.    LABORATORY PANEL:   CBC  Recent Labs Lab 04/11/16 0122  WBC 13.5*  HGB 10.2*  HCT 29.4*  PLT 162   ------------------------------------------------------------------------------------------------------------------  Chemistries   Recent Labs Lab 04/11/16 0122  NA 122*  K 4.0  CL 94*  CO2 22  GLUCOSE 170*  BUN 20  CREATININE 1.11*  CALCIUM 7.9*   ------------------------------------------------------------------------------------------------------------------  Cardiac Enzymes  Recent Labs Lab 04/11/16 0811  TROPONINI 0.04*   ------------------------------------------------------------------------------------------------------------------  RADIOLOGY:  Dg Chest Port 1 View  04/10/2016  CLINICAL DATA:  Acute onset of generalized weakness, chest pain, fever and dysuria. Initial encounter. EXAM: PORTABLE CHEST 1 VIEW COMPARISON:  Chest radiograph performed 03/12/2016 FINDINGS: The lungs are hypoexpanded. Patchy left-sided airspace opacity  could  reflect mild pneumonia. There is no evidence of pleural effusion or pneumothorax. The cardiomediastinal silhouette is borderline enlarged. A pacemaker/AICD is noted overlying the left chest wall, with leads ending overlying the right atrium, right ventricle and coronary sinus. No acute osseous abnormalities are seen. IMPRESSION: Lungs hypoexpanded. Patchy left-sided airspace opacity could reflect mild pneumonia, given the patient's symptoms. Borderline cardiomegaly. Electronically Signed   By: Garald Balding M.D.   On: 04/10/2016 19:35    EKG:   Orders placed or performed during the hospital encounter of 04/10/16  . ED EKG  . ED EKG    ASSESSMENT AND PLAN:    UTI (lower urinary tract infection) - continue IV rocephin, follow up urine culture and blood culture.  PNA (CAP) and leukocytosis.  Add zithromax. F/u CBC, cultures.  Hyponatremia. Continue NS iv, watch for fluid overload due to her history of CHF.   Essential hypertension - currently stable, due to orthostatic hypotension, hold antihypertensives for now.  Coronary atherosclerosis - continue home meds  Chronic systolic heart failure ([place]) - continue home meds  COPD (chronic obstructive pulmonary disease) ([place]) - continue home inhalers  Hypothyroidism - home dose thyroid replacement   Weakness: PT.  All the records are reviewed and case discussed with Care Management/Social Workerr. Management plans discussed with the patient, her daughter and they are in agreement.  CODE STATUS: Full code.  TOTAL TIME TAKING CARE OF THIS PATIENT: 40 minutes.  Greater than 50% time was spent on coordination of care and face-to-face counseling.  POSSIBLE D/C IN 2-3 DAYS, DEPENDING ON CLINICAL CONDITION.   Demetrios Loll M.D on 04/11/2016 at 1:44 PM  Between 7am to 6pm - Pager - [phone]  After 6pm go to [email or web address] - password [email or web address] [facility]  Office  [phone]  CC: Primary care physician;  Kirk Ruths., MD

## 2016-04-12 DIAGNOSIS — N390 Urinary tract infection, site not specified: Secondary | ICD-10-CM | POA: Diagnosis not present

## 2016-04-12 DIAGNOSIS — J189 Pneumonia, unspecified organism: Secondary | ICD-10-CM | POA: Diagnosis not present

## 2016-04-12 LAB — BASIC METABOLIC PANEL
Anion gap: 7 (ref 5–15)
BUN: 19 mg/dL (ref 6–20)
CO2: 25 mmol/L (ref 22–32)
CREATININE: 0.91 mg/dL (ref 0.44–1.00)
Calcium: 8.6 mg/dL — ABNORMAL LOW (ref 8.9–10.3)
Chloride: 99 mmol/L — ABNORMAL LOW (ref 101–111)
GFR calc Af Amer: >60 mL/min (ref >60)
GFR, EST NON AFRICAN AMERICAN: 57 mL/min — AB (ref >60)
GLUCOSE: 101 mg/dL — AB (ref 65–99)
POTASSIUM: 4 mmol/L (ref 3.5–5.1)
Sodium: 131 mmol/L — ABNORMAL LOW (ref 135–145)

## 2016-04-12 LAB — CBC
HEMATOCRIT: 31.3 % — AB (ref 35.0–47.0)
Hemoglobin: 11 g/dL — ABNORMAL LOW (ref 12.0–16.0)
MCH: 31.5 pg (ref 26.0–34.0)
MCHC: 35 g/dL (ref 32.0–36.0)
MCV: 90.2 fL (ref 80.0–100.0)
PLATELETS: 199 10*3/uL (ref 150–440)
RBC: 3.47 MIL/uL — ABNORMAL LOW (ref 3.80–5.20)
RDW: 14.9 % — AB (ref 11.5–14.5)
WBC: 10.5 10*3/uL (ref 3.6–11.0)

## 2016-04-12 MED ORDER — AZITHROMYCIN 250 MG PO TABS
500.0000 mg | ORAL_TABLET | Freq: Every day | ORAL | Status: DC
Start: 2016-04-13 — End: 2016-04-15
  Administered 2016-04-13 – 2016-04-14 (×2): 500 mg via ORAL
  Filled 2016-04-12 (×2): qty 2

## 2016-04-12 MED ORDER — SODIUM CHLORIDE 0.9 % IV SOLN
INTRAVENOUS | Status: DC
  Administered 2016-04-12 – 2016-04-13 (×2): via INTRAVENOUS

## 2016-04-12 NOTE — Progress Notes (Signed)
Patient very confused this morning, does not understand why people are in her house, refusing anyone to touch her until her daughter arrives this morning. Patient asked for something to help her rest last night she was given ativan, confusion onset was  after taking ativan.

## 2016-04-12 NOTE — Evaluation (Signed)
Physical Therapy Evaluation Patient Details Name: Eshanti Moscone MRN: [id number] DOB: [birth year] Today's Date: 04/12/2016   History of Present Illness  82 y/o female who was here ~1 month ago for TIA/CVA.  She is now admitted with UTI and has had some AMS.  Clinical Impression  Pt is here for UTI and has been complaining of persistent cough.  She is able to participate fairly well with PT but is at times impulsive and needs extra cuing for safety/awareness.  She is able to ambulate ~100 ft with HHA, reports she often uses an AD in the home but sometimes does not.  She apparently lives in an apartment below her family and has other friends that could stay with her as well.  It pt had 24 hr assist she could be safe at home, this is what the patient wishes.  She did have some fatigue with the effort of walking and did though she did not have any LOBs she was unsteady and at risk for falls.     Follow Up Recommendations Home health PT;SNF (per progress, states that she does not want to go to rehab)    Equipment Recommendations       Recommendations for Other Services       Precautions / Restrictions Precautions Precautions: Fall Restrictions Weight Bearing Restrictions: No      Mobility  Bed Mobility Overal bed mobility: Independent             General bed mobility comments: Pt able to get to sitting at EOB w/o assist  Transfers Overall transfer level: Modified independent Equipment used: 1 person hand held assist             General transfer comment: Pt essentially able to rise to standing w/o assist, but does hold PT's hand for stability.  Ambulation/Gait Ambulation/Gait assistance: Min guard;Min assist Ambulation Distance (Feet): 100 Feet Assistive device: 1 person hand held assist       General Gait Details: Pt with rather impulsive and at times inconsistent ambulation.  She did not rely heavily on single hand assist, but had a few stagger steps and needed a lot  of cuing to stay on task.  Pt did have a drop in O2 from mid 90s pre ambulation to mid/high 80s during/post ambulation.  Pt does not report excessive fatigue but did have a lot of coughing while up  Stairs            Wheelchair Mobility    Modified Rankin (Stroke Patients Only)       Balance Overall balance assessment: Needs assistance   Sitting balance-Leahy Scale: Normal     Standing balance support: Single extremity supported Standing balance-Leahy Scale: Fair                               Pertinent Vitals/Pain Pain Assessment: No/denies pain    Home Living Family/patient expects to be discharged to:: Private residence Living Arrangements: Children Available Help at Discharge: Family;Available PRN/intermittently Type of Home: Apartment Home Access: Level entry     Home Layout: One level Home Equipment: Shower seat - built in;Walker - 4 wheels;Walker - 2 wheels;Wheelchair - manual      Prior Function Level of Independence: Needs assistance   Gait / Transfers Assistance Needed: household distances only with multiple falls x 78months     Comments: Pt reports that she sometimes uses the furniture, sometimes a cane, sometimes walker  Hand Dominance        Extremity/Trunk Assessment   Upper Extremity Assessment: Generalized weakness;Overall WFL for tasks assessed (appears to have age appropriate deficits)           Lower Extremity Assessment: Generalized weakness;Overall WFL for tasks assessed (appears to have age appropriate deficits)         Communication   Communication: No difficulties  Cognition Arousal/Alertness: Awake/alert Behavior During Therapy: Impulsive Overall Cognitive Status: Difficult to assess                      General Comments      Exercises        Assessment/Plan    PT Assessment Patient needs continued PT services  PT Diagnosis Difficulty walking;Generalized weakness   PT Problem List  Decreased strength;Decreased activity tolerance;Decreased balance;Decreased mobility;Decreased coordination;Decreased knowledge of use of DME;Decreased safety awareness  PT Treatment Interventions DME instruction;Gait training;Functional mobility training;Therapeutic activities;Therapeutic exercise;Balance training;Neuromuscular re-education;Cognitive remediation;Patient/family education   PT Goals (Current goals can be found in the Care Plan section) Acute Rehab PT Goals Patient Stated Goal: Pt wants to go home, reports she has family/friends who could stay with her PT Goal Formulation: With patient Time For Goal Achievement: 04/26/16 Potential to Achieve Goals: Fair    Frequency Min 2X/week   Barriers to discharge        Co-evaluation               End of Session Equipment Utilized During Treatment: Gait belt Activity Tolerance: Patient tolerated treatment well Patient left: with chair alarm set;with call bell/phone within reach           Time: 0827-0845 PT Time Calculation (min) (ACUTE ONLY): 18 min   Charges:   PT Evaluation $PT Eval Low Complexity: 1 Procedure     PT G CodesKreg Shropshire, DPT 04/12/2016, 10:55 AM

## 2016-04-12 NOTE — Progress Notes (Signed)
Pt became SOB while sitting in chair. Oxygen Sat 97%. Lungs ausculted-crackles present. RN placed pt on 2L [place] for Pt comfort. RN called RT for PRN breathing treatment. RN also administered PRN cough medication. Pt is in room with family. RN called pt's daughter and updated above. RN will place sticky note for prn Cough medication and PRN ativan to be given at separate times.

## 2016-04-12 NOTE — Progress Notes (Signed)
[facility] [place] NAME: Erica [name]    MR#:  [id number]  DATE OF BIRTH:  12/30/1930  SUBJECTIVE:  CHIEF COMPLAINT:   Chief Complaint  Patient presents with  . Weakness  . Dysuria  Cough  REVIEW OF SYSTEMS:  CONSTITUTIONAL: No fever, fatigue or weakness.  RESPIRATORY: cough,.    DRUG ALLERGIES:   Allergies  Allergen Reactions  . Ciprofloxacin Anaphylaxis and Hives  . Ace Inhibitors Other (See Comments)    Reaction:  Unknown   . Amlodipine-Atorvastatin Other (See Comments)    Reaction:  Unknown   . Atorvastatin Other (See Comments)    Muscle cramps  . Amoxicillin Itching, Rash and Other (See Comments)    Has patient had a PCN reaction causing immediate rash, facial/tongue/throat swelling, SOB or lightheadedness with hypotension: No Has patient had a PCN reaction causing severe rash involving mucus membranes or skin necrosis: No Has patient had a PCN reaction that required hospitalization No Has patient had a PCN reaction occurring within the last 10 years: Yes If all of the above answers are "NO", then may proceed with Cephalosporin use.  . Quinolones Itching and Rash    VITALS:  Blood pressure 148/62, pulse 90, temperature 98.3 F (36.8 C), temperature source Oral, resp. rate 20, height 5\' 3"  (1.6 m), weight 64.501 kg (142 lb 3.2 oz), SpO2 92 %.  PHYSICAL EXAMINATION:  GENERAL:  82 y.o.-year-old c/o cough no acute distress EYES: Pupils equal, round, reactive to light and accommodation. No scleral icterus.  HEENT: Head atraumatic, normocephalic. Oropharynx and nasopharynx clear.  NECK:  Supple, no jugular venous distention.  LUNGS:Mild scattered rhonchi. No use of accessory muscles of respiration. No dullness to percussion  CARDIOVASCULAR: S1, S2 normal. No murmurs, rubs, or gallops.  ABDOMEN: Soft, nontender, nondistended. Bowel sounds present. No organomegaly or mass.  EXTREMITIES: No edema, cyanosis, or  clubbing.  NEUROLOGIC: Cranial nerves II through XII are intact. Muscle strength 5/5 in all extremities. PSYCHIATRIC: The patient is alert and oriented x 3.  SKIN: No obvious rash,    LABORATORY PANEL:   CBC  Recent Labs Lab 04/11/16 0122  WBC 13.5*  HGB 10.2*  HCT 29.4*  PLT 162   ------------------------------------------------------------------------------------------------------------------  Chemistries   Recent Labs Lab 04/11/16 0122  NA 122*  K 4.0  CL 94*  CO2 22  GLUCOSE 170*  BUN 20  CREATININE 1.11*  CALCIUM 7.9*   ------------------------------------------------------------------------------------------------------------------  Cardiac Enzymes  Recent Labs Lab 04/11/16 1421  TROPONINI <0.03   ------------------------------------------------------------------------------------------------------------------  RADIOLOGY:  Dg Chest Port 1 View  04/10/2016  CLINICAL DATA:  Acute onset of generalized weakness, chest pain, fever and dysuria. Initial encounter. EXAM: PORTABLE CHEST 1 VIEW COMPARISON:  Chest radiograph performed 03/12/2016 FINDINGS: The lungs are hypoexpanded. Patchy left-sided airspace opacity could reflect mild pneumonia. There is no evidence of pleural effusion or pneumothorax. The cardiomediastinal silhouette is borderline enlarged. A pacemaker/AICD is noted overlying the left chest wall, with leads ending overlying the right atrium, right ventricle and coronary sinus. No acute osseous abnormalities are seen. IMPRESSION: Lungs hypoexpanded. Patchy left-sided airspace opacity could reflect mild pneumonia, given the patient's symptoms. Borderline cardiomegaly. Electronically Signed   By: Garald Balding M.D.   On: 04/10/2016 19:35    EKG:   Orders placed or performed during the hospital encounter of 04/10/16  . ED EKG  . ED EKG    ASSESSMENT AND PLAN:   1. Pneumonia: On rocephin and zithromax.  2. UTI  - continue IV rocephin, Culture  shows e. Coli. Awaiting sensativities.Marland Kitchen   3.Hyponatremia. Likely combination of dehydration and mild SIADH. Will hydrate gently with NS and f/u Na in am.   4.Essential hypertension - currently stable, due to orthostatic hypotension, hold antihypertensives for now.  5. Chronic systolic heart failure ([place]) - continue home meds. Monitor fluids  6. COPD (chronic obstructive pulmonary disease) ([place]) - continue home inhalers   7. Weakness: PT evaluating. PT may need SNF.  All the records are reviewed and case discussed with Care Management/Social Workerr. Management plans discussed with the patient, her daughter and they are in agreement.  CODE STATUS: Full code.  TOTAL TIME TAKING CARE OF THIS PATIENT: 40 minutes.    POSSIBLE D/C IN 2-3 DAYS, DEPENDING ON CLINICAL CONDITION.   Dana Allan.D on 04/12/2016 at 11:15 AM  Between 7am to 6pm - Pager - [phone]  After 6pm go to [email or web address] - password [email or web address] [facility]  Office  [phone]  CC: Primary care physician; Kirk Ruths., MD

## 2016-04-12 NOTE — Progress Notes (Signed)
Antibiotics IV to PO:  Patient is an 81 yo female ordered Azithromycin 500 mg IV q24h for pneumonia.  Patient meets below criteria for IV to PO transition per hospital policy:    1.  Patient has White Blood Count < 11.1 x 103/mm3    2.  Patient has maintained temperature < 99.5 Fahrenheit for twenty-four hours 3.  Patient has eaten > 50% of meals for twenty-four hours 4.  Patient is taking other medications by mouth   Will transition patient to Azithromycin 500 mg po daily.   Murrell Converse, PharmD Clinical Pharmacist 04/12/2016

## 2016-04-13 DIAGNOSIS — N390 Urinary tract infection, site not specified: Secondary | ICD-10-CM | POA: Diagnosis not present

## 2016-04-13 DIAGNOSIS — J189 Pneumonia, unspecified organism: Secondary | ICD-10-CM | POA: Diagnosis not present

## 2016-04-13 LAB — URINE CULTURE: Culture: >=100000 — AB

## 2016-04-13 LAB — BASIC METABOLIC PANEL
ANION GAP: 6 (ref 5–15)
BUN: 15 mg/dL (ref 6–20)
CALCIUM: 8.3 mg/dL — AB (ref 8.9–10.3)
CO2: 25 mmol/L (ref 22–32)
Chloride: 103 mmol/L (ref 101–111)
Creatinine, Ser: 0.84 mg/dL (ref 0.44–1.00)
Glucose, Bld: 107 mg/dL — ABNORMAL HIGH (ref 65–99)
Potassium: 4 mmol/L (ref 3.5–5.1)
SODIUM: 134 mmol/L — AB (ref 135–145)

## 2016-04-13 NOTE — Plan of Care (Signed)
Problem: Safety: Goal: Ability to remain free from injury will improve Outcome: Progressing oob with assist. Gets sob with increased exertion and sats  Drops if off 02 at present.  Problem: Pain Managment: Goal: General experience of comfort will improve Outcome: Progressing Cough robitussin given x3 today and svns  Problem: Physical Regulation: Goal: Will remain free from infection Outcome: Progressing abx cont  Problem: Activity: Goal: Risk for activity intolerance will decrease See above note  Problem: Fluid Volume: Goal: Ability to maintain a balanced intake and output will improve Outcome: Progressing ivf rate decreased to 10 ml / hr

## 2016-04-13 NOTE — Progress Notes (Signed)
[facility] at [place] NAME: Erica [name]    MR#:  [id number]  DATE OF BIRTH:  04-05-1931  SUBJECTIVE:  CHIEF COMPLAINT:   Chief Complaint  Patient presents with  . Weakness  . Dysuria  Feels better today. Still has some cough.  REVIEW OF SYSTEMS:  CONSTITUTIONAL: No fever, fatigue or weakness.  RESPIRATORY: cough,.    DRUG ALLERGIES:   Allergies  Allergen Reactions  . Ciprofloxacin Anaphylaxis and Hives  . Ace Inhibitors Other (See Comments)    Reaction:  Unknown   . Amlodipine-Atorvastatin Other (See Comments)    Reaction:  Unknown   . Atorvastatin Other (See Comments)    Muscle cramps  . Amoxicillin Itching, Rash and Other (See Comments)    Has patient had a PCN reaction causing immediate rash, facial/tongue/throat swelling, SOB or lightheadedness with hypotension: No Has patient had a PCN reaction causing severe rash involving mucus membranes or skin necrosis: No Has patient had a PCN reaction that required hospitalization No Has patient had a PCN reaction occurring within the last 10 years: Yes If all of the above answers are "NO", then may proceed with Cephalosporin use.  . Quinolones Itching and Rash    VITALS:  Blood pressure 135/57, pulse 93, temperature 97.7 F (36.5 C), temperature source Oral, resp. rate 20, height 5\' 3"  (1.6 m), weight 65.908 kg (145 lb 4.8 oz), SpO2 93 %.  PHYSICAL EXAMINATION:  GENERAL:  82 y.o.-year-old no acute distress EYES: Pupils equal, round, reactive to light . No scleral icterus.  HEENT: Head atraumatic, normocephalic. Oropharynx and nasopharynx clear.  NECK:  Supple, no jugular venous distention.  LUNGS:Mild scattered rhonchi right lung field, left CTA. No use of accessory muscles of respiration. No dullness to percussion  CARDIOVASCULAR: S1, S2 normal. No murmurs, rubs, or gallops.  ABDOMEN: Soft, nontender, nondistended. Bowel sounds present. No organomegaly or mass.   EXTREMITIES: No edema, cyanosis, or clubbing.  NEUROLOGIC: Cranial nerves II through XII are intact. Muscle strength 5/5 in all extremities.  PSYCHIATRIC: The patient is alert and oriented x 3.  SKIN: No rash,    LABORATORY PANEL:   CBC  Recent Labs Lab 04/12/16 1114  WBC 10.5  HGB 11.0*  HCT 31.3*  PLT 199   ------------------------------------------------------------------------------------------------------------------  Chemistries   Recent Labs Lab 04/13/16 0531  NA 134*  K 4.0  CL 103  CO2 25  GLUCOSE 107*  BUN 15  CREATININE 0.84  CALCIUM 8.3*   ------------------------------------------------------------------------------------------------------------------  Cardiac Enzymes  Recent Labs Lab 04/11/16 1421  TROPONINI <0.03   ------------------------------------------------------------------------------------------------------------------  RADIOLOGY:  No results found.  EKG:   Orders placed or performed during the hospital encounter of 04/10/16  . ED EKG  . ED EKG    ASSESSMENT AND PLAN:   1. Pneumonia: On rocephin and zithromax. Clinically improving. Likely can be changed to PO abx tomorrow.  2. UTI  - continue IV rocephin, Culture shows e. Coli. Awaiting sensativities.Marland Kitchen  3.Hyponatremia. Likely combination of dehydration and mild SIADH. Na normal after IVF. Will stop fluids today.   4.Essential hypertension - currently stable, due to orthostatic hypotension, hold antihypertensives for now.  5. Chronic systolic heart failure ([place]) - continue home meds. Monitor fluids  6. COPD (chronic obstructive pulmonary disease) ([place]) - continue home inhalers   7. Weakness: PT evaluating. PT may need SNF vs [facility].  All the records are reviewed and case discussed with Care Management/Social Workerr. Management plans discussed with the patient,  her daughter and they are in agreement.  CODE STATUS: Full code.  TOTAL TIME TAKING CARE OF THIS PATIENT:  20 minutes.    POSSIBLE D/C IN 1-2 DAYS, DEPENDING ON CLINICAL CONDITION.   Dana Allan.D on 04/13/2016 at 11:10 AM  Between 7am to 6pm - Pager - [phone]  After 6pm go to [email or web address] - password [email or web address] [facility]  Office  [phone]  CC: Primary care physician; Kirk Ruths., MD

## 2016-04-14 DIAGNOSIS — J189 Pneumonia, unspecified organism: Secondary | ICD-10-CM | POA: Diagnosis not present

## 2016-04-14 DIAGNOSIS — N390 Urinary tract infection, site not specified: Secondary | ICD-10-CM | POA: Diagnosis not present

## 2016-04-14 LAB — CBC
HCT: 28.1 % — ABNORMAL LOW (ref 35.0–47.0)
HEMOGLOBIN: 9.4 g/dL — AB (ref 12.0–16.0)
MCH: 30.7 pg (ref 26.0–34.0)
MCHC: 33.5 g/dL (ref 32.0–36.0)
MCV: 91.6 fL (ref 80.0–100.0)
Platelets: 195 10*3/uL (ref 150–440)
RBC: 3.07 MIL/uL — ABNORMAL LOW (ref 3.80–5.20)
RDW: 14.8 % — AB (ref 11.5–14.5)
WBC: 6.1 10*3/uL (ref 3.6–11.0)

## 2016-04-14 MED ORDER — DEXTROSE 5 % IV SOLN
1.0000 g | INTRAVENOUS | Status: DC
  Administered 2016-04-14: 1 g via INTRAVENOUS
  Filled 2016-04-14 (×2): qty 10

## 2016-04-14 NOTE — Clinical Social Work Note (Signed)
Clinical Social Work Assessment  Patient Details  Name: Erica [name] MRN: [id number] Date of Birth: [birth year]  Date of referral:  04/14/16               Reason for consult:  Facility Placement                Permission sought to share information with:  Chartered certified accountant granted to share information::  Yes, Verbal Permission Granted  Name::      [place]::   [place]   Relationship::     Contact Information:     Housing/Transportation Living arrangements for the past 2 months:  [place], [place] of Information:  Patient, Adult Children Patient Interpreter Needed:  None Criminal Activity/Legal Involvement Pertinent to Current Situation/Hospitalization:  No - Comment as needed Significant Relationships:  Adult Children Lives with:  Adult Children Do you feel safe going back to the place where you live?  Yes Need for family participation in patient care:  Yes (Comment)  Care giving concerns:  Patient lives with her daughter Erica [name] [phone] and son in law in [place].    Social Worker assessment / plan:  Holiday representative (CSW) received verbal consult that patient's daughter is requesting SNF placement. PT is recommending home health VS. SNF. Patient is walking 100 feet. CSW met with patient's daughter Erica [name] outside of patient's room. Per daughter patient has lived in her basement for the past 10 years. Daughter became tearful and reported that patient cannot go home. CSW explained SNF options and that Health Team will have to approve SNF stay. Per daughter patient just got out of rehab at [facility] 2 weeks ago. CSW explained that patient will likely be in her co-pay days for SNF. CSW also discussed private pay and Medicaid. Per daughter patient cannot pay privately. CSW gave daughter information on how to apply for Medicaid. Daughter is agreeable to SNF search in [place]. CSW  explained to daughter that if [employer] does not approve SNF then patient can pay out of pocket for SNF or go home with home health and wait in [place] application. Daughter verbalized her understanding.   CSW also met with patient and her daughter was present. Patient is agreeable to SNF for short term rehab. Patient reported that she will do whatever her daughter wants her to do. FL2 complete and faxed out. CSW also contacted Healthcare case manager that is on call today and made her aware of above. CSW will continue to follow and assist as needed.   Employment status:  Disabled (Comment on whether or not currently receiving Disability), Retired Nurse, adult PT Recommendations:  [place], Home with [place] / Referral to community resources:  [place]  Patient/Family's Response to care:  Patient and daughter are agreeable to SNF search.   Patient/Family's Understanding of and Emotional Response to Diagnosis, Current Treatment, and Prognosis:  Patient and daughter were pleasant and thanked CSW for visit.   Emotional Assessment Appearance:  Appears stated age Attitude/Demeanor/Rapport:    Affect (typically observed):  Accepting, Adaptable, Pleasant Orientation:  Oriented to Self, Oriented to Place, Oriented to  Time, Oriented to Situation Alcohol / Substance use:  Not Applicable Psych involvement (Current and /or in the community):  No (Comment)  Discharge Needs  Concerns to be addressed:  Discharge Planning Concerns Readmission within the last 30 days:  No Current discharge risk:  Dependent with Mobility Barriers to Discharge:  Continued Medical Work up   Elwyn Reach 04/14/2016, 3:43 PM

## 2016-04-14 NOTE — NC FL2 (Signed)
[place] LEVEL OF CARE SCREENING TOOL     IDENTIFICATION  Patient Name: Erica [name] Birthdate: 01-26-1931 Sex: female Admission Date (Current Location): 04/10/2016  [place] and [place] Number:  Engineering geologist and Address:  [facility], [address], [place], [place] [address]      Provider Number: [id number]  Attending Physician Name and Address:  Baxter Hire, MD  Relative Name and Phone Number:       Current Level of Care: Hospital Recommended Level of Care: [place] Prior Approval Number:    Date Approved/Denied:   PASRR Number:  ([id number])  Discharge Plan: SNF    Current Diagnoses: Patient Active Problem List   Diagnosis Date Noted  . Hypothyroidism 04/10/2016  . COPD (chronic obstructive pulmonary disease) ([place]) 04/10/2016  . UTI (lower urinary tract infection) 04/10/2016  . Hyponatremia 04/10/2016  . Blind left eye   . Frequent falls   . LBBB (left bundle branch block)   . TIA (transient ischemic attack)   . Chronic systolic CHF (congestive heart failure) ([place])   . Falls   . Leg weakness, bilateral   . Elevated troponin   . CVA (cerebral infarction) 03/12/2016  . Gait instability 01/08/2016  . Chronic systolic heart failure ([place]) 02/02/2015  . Cough 01/18/2014  . Weakness generalized 01/18/2014  . Acute bronchitis 12/28/2013  . Nonischemic cardiomyopathy ([place]) 12/28/2013  . Orthostatic hypotension 04/06/2012  . Biventricular implantable cardioverter-defibrillator in situ 04/04/2011  . HYPERLIPIDEMIA, MIXED 10/23/2009  . Essential hypertension 10/23/2009  . Coronary atherosclerosis 10/23/2009    Orientation RESPIRATION BLADDER Height & Weight     Self, Time, Situation, Place  O2 (2 Liters Oxygen ) Continent Weight: 145 lb 4.8 oz (65.908 kg) Height:  5\' 3"  (160 cm)  BEHAVIORAL SYMPTOMS/MOOD NEUROLOGICAL BOWEL NUTRITION STATUS   (none )  (none ) Continent Diet (Diet: Heart  Healthy )  AMBULATORY STATUS COMMUNICATION OF NEEDS Skin   Limited Assist Verbally Normal                       Personal Care Assistance Level of Assistance  Bathing, Feeding, Dressing Bathing Assistance: Limited assistance Feeding assistance: Independent Dressing Assistance: Limited assistance     Functional Limitations Info  Sight, Hearing, Speech Sight Info: Adequate Hearing Info: Adequate Speech Info: Adequate    SPECIAL CARE FACTORS FREQUENCY  PT (By licensed PT), OT (By licensed OT)     PT Frequency:  (5 ) OT Frequency:  (5)            Contractures      Additional Factors Info  Code Status, Allergies, Insulin Sliding Scale Code Status Info:  (Full Code. ) Allergies Info:  (Ciprofloxacin, Ace Inhibitors, Amlodipine-atorvastatin, Atorvastatin, Amoxicillin, Quinolones)           Current Medications (04/14/2016):  This is the current hospital active medication list Current Facility-Administered Medications  Medication Dose Route Frequency Provider Last Rate Last Dose  . 0.9 %  sodium chloride infusion   Intravenous Continuous Baxter Hire, MD 10 mL/hr at 04/13/16 1200 10 mL/hr at 04/13/16 1200  . acetaminophen (TYLENOL) tablet 650 mg  650 mg Oral Q6H PRN Lance Coon, MD   650 mg at 04/11/16 1302   Or  . acetaminophen (TYLENOL) suppository 650 mg  650 mg Rectal Q6H PRN Lance Coon, MD      . albuterol (PROVENTIL) (2.5 MG/3ML) 0.083% nebulizer solution 3 mL  3 mL Inhalation  Q6H PRN Lance Coon, MD   3 mL at 04/13/16 2116  . aspirin chewable tablet 81 mg  81 mg Oral Daily Lance Coon, MD   81 mg at 04/14/16 [id number]  . azithromycin [facility]) tablet 500 mg  500 mg Oral Daily Loleta Dicker, RPH   500 mg at 04/13/16 1834  . cefTRIAXone (ROCEPHIN) 1 g in dextrose 5 % 50 mL IVPB  1 g Intravenous Q24H Baxter Hire, MD   1 g at 04/14/16 1249  . clopidogrel (PLAVIX) tablet 75 mg  75 mg Oral Daily Lance Coon, MD   75 mg at 04/14/16 [id number]  . enoxaparin  (LOVENOX) injection 40 mg  40 mg Subcutaneous QHS Lance Coon, MD   40 mg at 04/13/16 2104  . guaiFENesin (ROBITUSSIN) 100 MG/5ML solution 100 mg  5 mL Oral Q4H PRN Demetrios Loll, MD   100 mg at 04/14/16 1429  . HYDROcodone-acetaminophen (NORCO/VICODIN) 5-325 MG per tablet 1 tablet  1 tablet Oral Q4H PRN Lance Coon, MD   1 tablet at 04/11/16 1349  . levothyroxine (SYNTHROID, LEVOTHROID) tablet 75 mcg  75 mcg Oral QAC breakfast Lance Coon, MD   75 mcg at 04/14/16 [id number]  . LORazepam (ATIVAN) tablet 1 mg  1 mg Oral QHS PRN Lance Coon, MD   1 mg at 04/13/16 2138  . mometasone-formoterol (DULERA) 200-5 MCG/ACT inhaler 2 puff  2 puff Inhalation BID Lance Coon, MD   2 puff at 04/14/16 [phone]  . ondansetron (ZOFRAN) tablet 4 mg  4 mg Oral Q6H PRN Lance Coon, MD       Or  . ondansetron [facility]) injection 4 mg  4 mg Intravenous Q6H PRN Lance Coon, MD      . pneumococcal 23 valent vaccine (PNU-IMMUNE) injection 0.5 mL  0.5 mL Intramuscular Tomorrow-1000 Lance Coon, MD   0.5 mL at 04/11/16 1010  . pravastatin (PRAVACHOL) tablet 10 mg  10 mg Oral QHS Lance Coon, MD   10 mg at 04/13/16 2103  . sodium chloride flush (NS) 0.9 % injection 3 mL  3 mL Intravenous Q12H Lance Coon, MD   3 mL at 04/14/16 0809     Discharge Medications: Please see discharge summary for a list of discharge medications.  Relevant Imaging Results:  Relevant Lab Results:   Additional Information  (SSN: [id number])  Loralyn Freshwater, LCSW

## 2016-04-14 NOTE — Care Management Important Message (Signed)
Important Message  Patient Details  Name: Erica [name] MRN: [id number] Date of Birth: [birth year]   Medicare Important Message Given:  Yes    Juliann Pulse A Elihu Milstein 04/14/2016, 11:09 AM

## 2016-04-14 NOTE — Clinical Social Work Placement (Signed)
   CLINICAL SOCIAL WORK PLACEMENT  NOTE  Date:  04/14/2016  Patient Details  Name: Erica [name] MRN: [id number] Date of Birth: February 18, 1931  Clinical Social Work is seeking post-discharge placement for this patient at the [place] level of care (*CSW will initial, date and re-position this form in  chart as items are completed):  Yes   Patient/family provided with [facility] Work Department's list of facilities offering this level of care within the geographic area requested by the patient (or if unable, by the patient's family).  Yes   Patient/family informed of their freedom to choose among providers that offer the needed level of care, that participate in Medicare, Medicaid or managed care program needed by the patient, have an available bed and are willing to accept the patient.  Yes   Patient/family informed of [facility]'s ownership interest in [facility] and [facility], as well as of the fact that they are under no obligation to receive care at these facilities.  PASRR submitted to EDS on       PASRR number received on       Existing PASRR number confirmed on 04/14/16     FL2 transmitted to all facilities in geographic area requested by pt/family on 04/14/16     FL2 transmitted to all facilities within larger geographic area on       Patient informed that his/her managed care company has contracts with or will negotiate with certain facilities, including the following:            Patient/family informed of bed offers received.  Patient chooses bed at       Physician recommends and patient chooses bed at      Patient to be transferred to   on  .  Patient to be transferred to facility by       Patient family notified on   of transfer.  Name of family member notified:        PHYSICIAN       Additional Comment:    _______________________________________________ Loralyn Freshwater, LCSW 04/14/2016, 3:42 PM

## 2016-04-14 NOTE — Progress Notes (Signed)
[facility] NAME: Erica [name]    MR#:  [id number]  DATE OF BIRTH:  [birth year]  SUBJECTIVE:  CHIEF COMPLAINT:   Chief Complaint  Patient presents with  . Weakness  . Dysuria  Weak with ambulation. Cough better.  REVIEW OF SYSTEMS:  CONSTITUTIONAL: No fever, fatigue or weakness.  RESPIRATORY: cough,.    DRUG ALLERGIES:   Allergies  Allergen Reactions  . Ciprofloxacin Anaphylaxis and Hives  . Ace Inhibitors Other (See Comments)    Reaction:  Unknown   . Amlodipine-Atorvastatin Other (See Comments)    Reaction:  Unknown   . Atorvastatin Other (See Comments)    Muscle cramps  . Amoxicillin Itching, Rash and Other (See Comments)    Has patient had a PCN reaction causing immediate rash, facial/tongue/throat swelling, SOB or lightheadedness with hypotension: No Has patient had a PCN reaction causing severe rash involving mucus membranes or skin necrosis: No Has patient had a PCN reaction that required hospitalization No Has patient had a PCN reaction occurring within the last 10 years: Yes If all of the above answers are "NO", then may proceed with Cephalosporin use.  . Quinolones Itching and Rash    VITALS:  Blood pressure 105/56, pulse 96, temperature 98.1 F (36.7 C), temperature source Oral, resp. rate 17, height 5\' 3"  (1.6 m), weight 65.908 kg (145 lb 4.8 oz), SpO2 99 %.  PHYSICAL EXAMINATION:  GENERAL:  82 y.o.-year-old no acute distress EYES: Pupils equal, round, reactive to light . No scleral icterus.  HEENT: Head atraumatic, normocephalic. Oropharynx and nasopharynx clear.  NECK:  Supple, no jugular venous distention.  LUNGS:Mild scattered rhonchi right lung field, left CTA. No use of accessory muscles of respiration. No dullness to percussion  CARDIOVASCULAR: S1, S2 normal. No murmurs, rubs, or gallops.  ABDOMEN: Soft, nontender, nondistended. Bowel sounds present. No organomegaly or mass.   EXTREMITIES: No edema, cyanosis, or clubbing.  NEUROLOGIC: Cranial nerves II through XII are intact.Marland Kitchen  PSYCHIATRIC: The patient is alert and oriented x 3.  SKIN: No rash,    LABORATORY PANEL:   CBC  Recent Labs Lab 04/14/16 0446  WBC 6.1  HGB 9.4*  HCT 28.1*  PLT 195   ------------------------------------------------------------------------------------------------------------------  Chemistries   Recent Labs Lab 04/13/16 0531  NA 134*  K 4.0  CL 103  CO2 25  GLUCOSE 107*  BUN 15  CREATININE 0.84  CALCIUM 8.3*   ------------------------------------------------------------------------------------------------------------------  Cardiac Enzymes  Recent Labs Lab 04/11/16 1421  TROPONINI <0.03   ------------------------------------------------------------------------------------------------------------------  RADIOLOGY:  No results found.  EKG:   Orders placed or performed during the hospital encounter of 04/10/16  . ED EKG  . ED EKG    ASSESSMENT AND PLAN:   1. Pneumonia:  Clinically improving. Patient was to be started on rocephin but have found out through pharmacy that it was held because of penc allergy. Pneumonia improving but will restart rocephin since low cross reactivity to penc.  2. UTI  -  e. Coli.shows multiple sensativities. Restart rocephin.  3.Hyponatremia. Likely combination of dehydration and mild SIADH. Na normal after IVF. Will stop fluids today.   4.Essential hypertension - currently stable, due to orthostatic hypotension, hold antihypertensives for now.  5. Chronic systolic heart failure ([place]) - continue home meds. Monitor fluids  6. COPD (chronic obstructive pulmonary disease) ([place]) - continue home inhalers  7. Orthostatic: Patient was orthostatic by pulse. Com[lained when PT got her up yesterday. Monitor.   8.  Weakness: PT evaluating. PT may need SNF. Daughter has decided on placement if she qualifies. SW and CM seeing  her.  All the records are reviewed and case discussed with Care Management/Social Workerr. Management plans discussed with the patient, her daughter and they are in agreement.  CODE STATUS: Full code.  TOTAL TIME TAKING CARE OF THIS PATIENT: 20 minutes.    POSSIBLE D/C IN 1-2 DAYS, DEPENDING ON CLINICAL CONDITION.   Dana Allan.D on 04/14/2016 at 12:07 PM  Between 7am to 6pm - Pager - [phone]  After 6pm go to [email or web address] - password [email or web address] [facility]  Office  [phone]  CC: Primary care physician; Kirk Ruths., MD

## 2016-04-15 ENCOUNTER — Inpatient Hospital Stay: Payer: PPO

## 2016-04-15 ENCOUNTER — Encounter: Payer: PPO | Admitting: Internal Medicine

## 2016-04-15 DIAGNOSIS — N390 Urinary tract infection, site not specified: Secondary | ICD-10-CM | POA: Diagnosis not present

## 2016-04-15 DIAGNOSIS — N12 Tubulo-interstitial nephritis, not specified as acute or chronic: Secondary | ICD-10-CM | POA: Diagnosis not present

## 2016-04-15 DIAGNOSIS — Z7902 Long term (current) use of antithrombotics/antiplatelets: Secondary | ICD-10-CM | POA: Diagnosis not present

## 2016-04-15 DIAGNOSIS — R05 Cough: Secondary | ICD-10-CM | POA: Diagnosis not present

## 2016-04-15 DIAGNOSIS — I951 Orthostatic hypotension: Secondary | ICD-10-CM | POA: Diagnosis not present

## 2016-04-15 DIAGNOSIS — E785 Hyperlipidemia, unspecified: Secondary | ICD-10-CM | POA: Diagnosis not present

## 2016-04-15 DIAGNOSIS — I429 Cardiomyopathy, unspecified: Secondary | ICD-10-CM | POA: Diagnosis not present

## 2016-04-15 DIAGNOSIS — F411 Generalized anxiety disorder: Secondary | ICD-10-CM | POA: Diagnosis not present

## 2016-04-15 DIAGNOSIS — Z95810 Presence of automatic (implantable) cardiac defibrillator: Secondary | ICD-10-CM | POA: Diagnosis not present

## 2016-04-15 DIAGNOSIS — E039 Hypothyroidism, unspecified: Secondary | ICD-10-CM | POA: Diagnosis not present

## 2016-04-15 DIAGNOSIS — I2510 Atherosclerotic heart disease of native coronary artery without angina pectoris: Secondary | ICD-10-CM | POA: Diagnosis not present

## 2016-04-15 DIAGNOSIS — Z85820 Personal history of malignant melanoma of skin: Secondary | ICD-10-CM | POA: Diagnosis not present

## 2016-04-15 DIAGNOSIS — Z9181 History of falling: Secondary | ICD-10-CM | POA: Diagnosis not present

## 2016-04-15 DIAGNOSIS — Z8673 Personal history of transient ischemic attack (TIA), and cerebral infarction without residual deficits: Secondary | ICD-10-CM | POA: Diagnosis not present

## 2016-04-15 DIAGNOSIS — J440 Chronic obstructive pulmonary disease with acute lower respiratory infection: Secondary | ICD-10-CM | POA: Diagnosis not present

## 2016-04-15 DIAGNOSIS — J189 Pneumonia, unspecified organism: Secondary | ICD-10-CM | POA: Diagnosis not present

## 2016-04-15 DIAGNOSIS — J449 Chronic obstructive pulmonary disease, unspecified: Secondary | ICD-10-CM | POA: Diagnosis not present

## 2016-04-15 DIAGNOSIS — Z7982 Long term (current) use of aspirin: Secondary | ICD-10-CM | POA: Diagnosis not present

## 2016-04-15 DIAGNOSIS — I5042 Chronic combined systolic (congestive) and diastolic (congestive) heart failure: Secondary | ICD-10-CM | POA: Diagnosis not present

## 2016-04-15 LAB — CULTURE, BLOOD (ROUTINE X 2)
CULTURE: NO GROWTH
Culture: NO GROWTH

## 2016-04-15 MED ORDER — AZITHROMYCIN 250 MG PO TABS: 250.0000 mg | ORAL_TABLET | Freq: Every day | ORAL | Status: DC

## 2016-04-15 MED ORDER — DEXTROSE 5 % IV SOLN
1.0000 g | INTRAVENOUS | Status: DC
  Filled 2016-04-15: qty 10

## 2016-04-15 MED ORDER — HYDROCOD POLST-CPM POLST ER 10-8 MG/5ML PO SUER: 5.0000 mL | Freq: Two times a day (BID) | ORAL | Status: DC | PRN

## 2016-04-15 MED ORDER — CEPHALEXIN 500 MG PO CAPS: 500.0000 mg | ORAL_CAPSULE | Freq: Three times a day (TID) | ORAL | Status: DC

## 2016-04-15 MED ORDER — AZITHROMYCIN 250 MG PO TABS: 500.0000 mg | ORAL_TABLET | ORAL | Status: DC

## 2016-04-15 MED ORDER — LORAZEPAM 1 MG PO TABS: 1.0000 mg | ORAL_TABLET | Freq: Every evening | ORAL | Status: DC | PRN

## 2016-04-15 NOTE — Discharge Instructions (Signed)
°  DIET:  °Cardiac diet ° °DISCHARGE CONDITION:  °Stable ° °ACTIVITY:  °Activity as tolerated ° °OXYGEN:  °Home Oxygen: No. °  °Oxygen Delivery: room air ° °DISCHARGE LOCATION:  °nursing home  ° °If you experience worsening of your admission symptoms, develop shortness of breath, life threatening emergency, suicidal or homicidal thoughts you must seek medical attention immediately by calling 911 or calling your MD immediately  if symptoms less severe. ° °You Must read complete instructions/literature along with all the possible adverse reactions/side effects for all the Medicines you take and that have been prescribed to you. Take any new Medicines after you have completely understood and accpet all the possible adverse reactions/side effects.  ° °Please note ° °You were cared for by a hospitalist during your hospital stay. If you have any questions about your discharge medications or the care you received while you were in the hospital after you are discharged, you can call the unit and asked to speak with the hospitalist on call if the hospitalist that took care of you is not available. Once you are discharged, your primary care physician will handle any further medical issues. Please note that NO REFILLS for any discharge medications will be authorized once you are discharged, as it is imperative that you return to your primary care physician (or establish a relationship with a primary care physician if you do not have one) for your aftercare needs so that they can reassess your need for medications and monitor your lab values. ° ° ° °

## 2016-04-15 NOTE — Progress Notes (Addendum)
Clinical Education officer, museum (CSW) contacted [place] Team case manager and asked her to review patient's chart to determine if patient will qualify for SNF. CSW will continue to follow and assist as needed.   Per [facility] Team case manager patient does have SNF days left however PT eval was on 04/12/16 and in order to determine SNF approval PT will have to see her today. CSW contacted PT and put in a request.   Blima Rich, LCSW [phone]

## 2016-04-15 NOTE — Progress Notes (Signed)
Physical Therapy Treatment Patient Details Name: Erica [name] MRN: [id number] DOB: 09-19-1931 Today's Date: 04/15/2016    History of Present Illness 82 y/o female who was here ~1 month ago for TIA/CVA.  She is now admitted with UTI and has had some AMS.    PT Comments    SATURATION QUALIFICATIONS: (This note is used to comply with regulatory documentation for home oxygen)  Patient Saturations on Room Air at Rest = 92%  Patient Saturations on Room Air while Ambulating = 90%  Patient Saturations on 0 Liters of oxygen while Ambulating =90%  Please briefly explain why patient needs home oxygen:  Pt is going to SNF to continue rehab and was able to walk with no air and maintain above 90%.  However, may need for more strenuous activity as her sat was lower end of normal.  Follow Up Recommendations  SNF     Equipment Recommendations  None recommended by PT    Recommendations for Other Services Rehab consult     Precautions / Restrictions Precautions Precautions: Fall Restrictions Weight Bearing Restrictions: No    Mobility  Bed Mobility Overal bed mobility: Independent                Transfers Overall transfer level: Modified independent Equipment used: 1 person hand held assist             General transfer comment: steadying pt to ensure her safety is met although she is able to get up alone  Ambulation/Gait Ambulation/Gait assistance: Min guard Ambulation Distance (Feet): 40 Feet Assistive device: 1 person hand held assist Gait Pattern/deviations: Narrow base of support;Wide base of support;Trunk flexed;Step-through pattern Gait velocity: reduced Gait velocity interpretation: Below normal speed for age/gender General Gait Details: Pt had some buckling at the door but then could assist to the chair.  Her ability is very inconsistent as she was then able to walk to bed.     Stairs            Wheelchair Mobility    Modified Rankin (Stroke  Patients Only)       Balance Overall balance assessment: Needs assistance Sitting-balance support: Feet supported Sitting balance-Leahy Scale: Normal   Postural control: Posterior lean Standing balance support: Single extremity supported Standing balance-Leahy Scale: Fair                      Cognition Arousal/Alertness: Awake/alert Behavior During Therapy: Impulsive Overall Cognitive Status: No family/caregiver present to determine baseline cognitive functioning       Memory: Decreased recall of precautions              Exercises      General Comments        Pertinent Vitals/Pain Pain Assessment: No/denies pain    Home Living                      Prior Function            PT Goals (current goals can now be found in the care plan section) Acute Rehab PT Goals Patient Stated Goal: I think I need to go to SNF Progress towards PT goals: Progressing toward goals    Frequency  Min 2X/week    PT Plan Discharge plan needs to be updated    Co-evaluation             End of Session Equipment Utilized During Treatment: Gait belt Activity Tolerance: Patient tolerated treatment well Patient left:  in bed;with bed alarm set;with call bell/phone within reach     Time: 0859-0923 PT Time Calculation (min) (ACUTE ONLY): 24 min  Charges:  $Gait Training: 8-22 mins $Therapeutic Activity: 8-22 mins                    G Codes:      Ramond Dial 2016-04-21, 10:29 AM    Mee Hives, PT MS Acute Rehab Dept. Number: [place]

## 2016-04-15 NOTE — Progress Notes (Signed)
Patient is medically stable for D/C to [facility] today. [employer] authorization has been received. Auth # [id number]. Per [facility] admissions coordinator at [facility] patient will go to room 506. RN will call report to 500 hall. Clinical Education officer, museum (CSW) sent D/C Summary, FL2 and D/C Packet to [employer] via [employer]. Patient is aware of above. Patient's daughter [name] is aware of above and will pick patient up and provide transport to [facility]. Please reconsult if future social work needs arise. CSW signing off.   Blima Rich, LCSW [phone]

## 2016-04-15 NOTE — Discharge Summary (Signed)
[facility] [place] NAME: Erica [name]    MR#:  [id number]  DATE OF BIRTH:  [birth year]  DATE OF ADMISSION:  04/10/2016 ADMITTING PHYSICIAN: Lance Coon, MD  DATE OF DISCHARGE: No discharge date for patient encounter.  PRIMARY CARE PHYSICIAN: Kirk Ruths., MD   ADMISSION DIAGNOSIS:  Other chest pain [R07.89] Hyponatremia [E87.1] Pyelonephritis [N12]  DISCHARGE DIAGNOSIS:  Principal Problem:   UTI (lower urinary tract infection) Active Problems:   Essential hypertension   Coronary atherosclerosis   Chronic systolic heart failure (HCC)   Hypothyroidism   COPD (chronic obstructive pulmonary disease) (HCC)   Hyponatremia   SECONDARY DIAGNOSIS:   Past Medical History  Diagnosis Date  . Nonischemic cardiomyopathy ([place])     a. s/p Medtronic CRT-D, NYHA II-III at baseline; b. echo 2011: EF 43%, mildly dilated LA, PASP 50mm Hg; c. echo 3/16: EF 35-40%, GR1DD, mild to mod AI, mild MR, PASP 65 mmHg  . Coronary artery disease     a. s/p prior stent-RCA; b. cath 6/14: mild to mod prox to mid LAD dz, mod prox to dRCA at 60% unchanged from prior cath; c. nuc 3/16: no sig ischemia, nl WM, EF 19%  . History of MI (myocardial infarction)   . Dyslipidemia   . Hypertension   . COPD (chronic obstructive pulmonary disease) ([place])   . Hypothyroidism   . Basal cell carcinoma 2011  . Respiratory infection   . Vitamin D deficiency   . Breast cancer ([place]) 30+ yrs. ago    right breast, radiation  . TIA (transient ischemic attack) 02/2016    a. negative head CT x 2, unable to have MRI brain given ICD; b. carotid doppler with 0-49% stenosis bilaterally with antegrade flow bilaterally   . Blind left eye   . Frequent falls   . LBBB (left bundle branch block)   . CHF (congestive heart failure) ([place])      ADMITTING HISTORY  Bev Lawter is a 82 y.o. female who presents with Weakness, malaise, and orthostatic hypotension. On  evaluation in the ED patient was found to have UTI with nitrites positive on her UA. She has an elevated white blood cell count, and hyponatremia which is new. Patient endorses some left flank pain, raising the question for possible pyelonephritis. Hospitalists were called for admission.  HOSPITAL COURSE:   1. Left pneumonia. On ceftriaxone and azithromycin in hospital D/c with keflex and azithromycin Acute hypoxic resp failure has resolved  2. UTI - e. Coli.is pansensitive. But allergic to CIprofloxacin Keflex at d/c  3.Hyponatremia.  ikely combination of dehydration and mild SIADH. Na normal after IVF. Stopped  4.Essential hypertension currently stable  5. Chronic systolic heart failure ([place]) continue home meds. Monitor fluids  6. COPD (chronic obstructive pulmonary disease) ([place]) continue home inhalers   7. Weakness SNF for further PT   Stable for d/c to SNF CONSULTS OBTAINED:     DRUG ALLERGIES:   Allergies  Allergen Reactions  . Ciprofloxacin Anaphylaxis and Hives  . Ace Inhibitors Other (See Comments)    Reaction:  Unknown   . Amlodipine-Atorvastatin Other (See Comments)    Reaction:  Unknown   . Atorvastatin Other (See Comments)    Muscle cramps  . Amoxicillin Itching, Rash and Other (See Comments)    Has patient had a PCN reaction causing immediate rash, facial/tongue/throat swelling, SOB or lightheadedness with hypotension: No Has patient had a PCN reaction causing severe rash involving mucus  membranes or skin necrosis: No Has patient had a PCN reaction that required hospitalization No Has patient had a PCN reaction occurring within the last 10 years: Yes If all of the above answers are "NO", then may proceed with Cephalosporin use.  . Quinolones Itching and Rash    DISCHARGE MEDICATIONS:   Current Discharge Medication List    START taking these medications   Details  azithromycin (ZITHROMAX) 250 MG tablet Take 1 tablet (250 mg total) by  mouth daily. Qty: 4 each, Refills: 0    cephALEXin (KEFLEX) 500 MG capsule Take 1 capsule (500 mg total) by mouth 3 (three) times daily. Qty: 12 capsule, Refills: 0    chlorpheniramine-HYDROcodone (TUSSIONEX PENNKINETIC ER) 10-8 MG/5ML SUER Take 5 mLs by mouth every 12 (twelve) hours as needed for cough. Qty: 115 mL, Refills: 0      CONTINUE these medications which have NOT CHANGED   Details  acetaminophen (TYLENOL) 325 MG tablet Take 325 mg by mouth every 6 (six) hours as needed.    aspirin 81 MG chewable tablet Chew 1 tablet (81 mg total) by mouth daily. Qty: 30 tablet, Refills: 2    Cholecalciferol 2000 units CAPS Take 1 capsule by mouth daily.    clopidogrel (PLAVIX) 75 MG tablet Take 1 tablet (75 mg total) by mouth daily. Qty: 30 tablet, Refills: 2    Fluticasone-Salmeterol (ADVAIR DISKUS) 250-50 MCG/DOSE AEPB Inhale 1 puff into the lungs 2 (two) times daily. Qty: 60 each, Refills: 1    furosemide (LASIX) 20 MG tablet Take 20 mg by mouth daily as needed for edema.    levothyroxine (SYNTHROID, LEVOTHROID) 75 MCG tablet Take 75 mcg by mouth daily before breakfast.     Multiple Vitamins-Minerals (MULTIVITAMIN GUMMIES ADULT PO) Take 1 each by mouth daily at 12 noon.     pravastatin (PRAVACHOL) 10 MG tablet Take 10 mg by mouth at bedtime.  Refills: 0    LORazepam (ATIVAN) 1 MG tablet Take 1 tablet (1 mg total) by mouth at bedtime as needed for anxiety or sleep. Qty: 20 tablet, Refills: 0      STOP taking these medications     albuterol (PROVENTIL HFA;VENTOLIN HFA) 108 (90 BASE) MCG/ACT inhaler         Today   VITAL SIGNS:  Blood pressure 140/66, pulse 91, temperature 97.6 F (36.4 C), temperature source Oral, resp. rate 24, height 5\' 3"  (1.6 m), weight 66.452 kg (146 lb 8 oz), SpO2 90 %.  I/O:   Intake/Output Summary (Last 24 hours) at 04/15/16 1208 Last data filed at 04/15/16 1100  Gross per 24 hour  Intake    480 ml  Output    200 ml  Net    280 ml     PHYSICAL EXAMINATION:  Physical Exam  GENERAL:  82 y.o.-year-old patient lying in the bed with no acute distress.  LUNGS: Normal breath sounds bilaterally, no wheezing, rales,rhonchi or crepitation. No use of accessory muscles of respiration.  CARDIOVASCULAR: S1, S2 normal. No murmurs, rubs, or gallops.  ABDOMEN: Soft, non-tender, non-distended. Bowel sounds present. No organomegaly or mass.  NEUROLOGIC: Moves all 4 extremities. PSYCHIATRIC: The patient is alert and awake SKIN: No obvious rash, lesion, or ulcer.   DATA REVIEW:   CBC  Recent Labs Lab 04/14/16 0446  WBC 6.1  HGB 9.4*  HCT 28.1*  PLT 195    Chemistries   Recent Labs Lab 04/13/16 0531  NA 134*  K 4.0  CL 103  CO2 25  GLUCOSE 107*  BUN 15  CREATININE 0.84  CALCIUM 8.3*    Cardiac Enzymes  Recent Labs Lab 04/11/16 1421  TROPONINI <0.03    Microbiology Results  Results for orders placed or performed during the hospital encounter of 04/10/16  Urine culture     Status: Abnormal   Collection Time: 04/10/16  5:46 PM  Result Value Ref Range Status   Specimen Description URINE, RANDOM  Final   Special Requests NONE  Final   Culture >=100,000 COLONIES/mL ESCHERICHIA COLI (A)  Final   Report Status 04/13/2016 FINAL  Final   Organism ID, Bacteria ESCHERICHIA COLI (A)  Final      Susceptibility   Escherichia coli - MIC*    AMPICILLIN <=2 SENSITIVE Sensitive     CEFAZOLIN <=4 SENSITIVE Sensitive     CEFTRIAXONE <=1 SENSITIVE Sensitive     CIPROFLOXACIN <=0.25 SENSITIVE Sensitive     GENTAMICIN <=1 SENSITIVE Sensitive     IMIPENEM <=0.25 SENSITIVE Sensitive     NITROFURANTOIN <=16 SENSITIVE Sensitive     TRIMETH/SULFA <=20 SENSITIVE Sensitive     AMPICILLIN/SULBACTAM <=2 SENSITIVE Sensitive     PIP/TAZO <=4 SENSITIVE Sensitive     Extended ESBL NEGATIVE Sensitive     * >=100,000 COLONIES/mL ESCHERICHIA COLI  Culture, blood (routine x 2)     Status: None   Collection Time: 04/10/16  7:24 PM   Result Value Ref Range Status   Specimen Description BLOOD LEFT ASSIST CONTROL  Final   Special Requests   Final    BOTTLES DRAWN AEROBIC AND ANAEROBIC [place] AERO 8CC ANA   Culture NO GROWTH 5 DAYS  Final   Report Status 04/15/2016 FINAL  Final  Culture, blood (routine x 2)     Status: None   Collection Time: 04/10/16  7:30 PM  Result Value Ref Range Status   Specimen Description BLOOD RIGHT ASSIST CONTROL  Final   Special Requests BOTTLES DRAWN AEROBIC AND ANAEROBIC 8CC  Final   Culture NO GROWTH 5 DAYS  Final   Report Status 04/15/2016 FINAL  Final    RADIOLOGY:  Dg Chest 2 View  04/15/2016  CLINICAL DATA:  82 year old with current history of COPD, coronary artery disease, CHF and personal history of right breast cancer, presenting with chronic cough. Former smoker. Followup left lower lobe pneumonia. EXAM: CHEST  2 VIEW COMPARISON:  04/10/2016 and earlier. FINDINGS: AP sitting erect and lateral images were obtained. Suboptimal inspiration. Patchy airspace consolidation in the left lower lobe, unchanged since the examination 5 days ago. Small left pleural effusion. Mild atelectasis in the right lower lobe and right middle lobe. No new pulmonary parenchymal abnormalities. Stable mild moderate cardiomegaly. Biventricular pacing defibrillator unchanged and appears intact. Right coronary artery stent. IMPRESSION: Stable left lower lobe pneumonia and associated small left pleural effusion. Mild atelectasis in the right middle lobe and right lower lobe related to suboptimal inspiration. Stable cardiomegaly without pulmonary edema. Electronically Signed   By: Evangeline Dakin M.D.   On: 04/15/2016 10:41    Follow up with PCP in 1 week.  Management plans discussed with the patient, family and they are in agreement.  CODE STATUS:     Code Status Orders        Start     Ordered   04/10/16 2324  Full code   Continuous     04/10/16 2323    Code Status History    Date Active Date Inactive  Code Status Order ID Comments User Context  03/12/2016  4:31 PM 03/14/2016 10:20 PM Full Code [id number]  Loletha Grayer, MD ED    Advance Directive Documentation        Most Recent Value   Type of Advance Directive  Healthcare Power of Attorney   Pre-existing out of facility DNR order (yellow form or pink MOST form)     "MOST" Form in Place?        TOTAL TIME TAKING CARE OF THIS PATIENT ON DAY OF DISCHARGE: more than 30 minutes.   Hillary Bow R M.D on 04/15/2016 at 12:08 PM  Between 7am to 6pm - Pager - [phone]  After 6pm go to [email or web address] - password [email or web address] [facility]  Office  [phone]  CC: Primary care physician; Kirk Ruths., MD  Note: This dictation was prepared with Dragon dictation along with smaller phrase technology. Any transcriptional errors that result from this process are unintentional.

## 2016-04-15 NOTE — Progress Notes (Signed)
Clinical Social Worker (CSW) contacted patient's daughter [name] and presented bed offers. Daughter chose [facility]. Doug admissions coordinator at [facility] is aware of accepted bed offer. [facility] case manager is aware of above.   Blima Rich, LCSW [phone]

## 2016-04-15 NOTE — Clinical Social Work Placement (Signed)
   CLINICAL SOCIAL WORK PLACEMENT  NOTE  Date:  04/15/2016  Patient Details  Name: Erica [name] MRN: [id number] Date of Birth: [birth year]  Clinical Social Work is seeking post-discharge placement for this patient at the [place] level of care (*CSW will initial, date and re-position this form in  chart as items are completed):  Yes   Patient/family provided with [place] [facility] Work Department's list of facilities offering this level of care within the geographic area requested by the patient (or if unable, by the patient's family).  Yes   Patient/family informed of their freedom to choose among providers that offer the needed level of care, that participate in Medicare, Medicaid or managed care program needed by the patient, have an available bed and are willing to accept the patient.  Yes   Patient/family informed of [facility]'s ownership interest in [facility] and [facility], as well as of the fact that they are under no obligation to receive care at these facilities.  PASRR submitted to EDS on       PASRR number received on       Existing PASRR number confirmed on 04/14/16     FL2 transmitted to all facilities in geographic area requested by pt/family on 04/14/16     FL2 transmitted to all facilities within larger geographic area on       Patient informed that his/her managed care company has contracts with or will negotiate with certain facilities, including the following:        Yes   Patient/family informed of bed offers received.  Patient chooses bed at  [facility] )     Physician recommends and patient chooses bed at      Patient to be transferred to  [facility] ) on 04/15/16.  Patient to be transferred to facility by  [facility] )     Patient family notified on 04/15/16 of transfer.  Name of family member notified:   (Patient's daughter [name] is aware of D/C today. )     PHYSICIAN       Additional  Comment:    _______________________________________________ Loralyn Freshwater, LCSW 04/15/2016, 2:35 PM

## 2016-04-17 DIAGNOSIS — F411 Generalized anxiety disorder: Secondary | ICD-10-CM | POA: Diagnosis not present

## 2016-04-17 DIAGNOSIS — I951 Orthostatic hypotension: Secondary | ICD-10-CM | POA: Diagnosis not present

## 2016-04-17 DIAGNOSIS — N12 Tubulo-interstitial nephritis, not specified as acute or chronic: Secondary | ICD-10-CM | POA: Diagnosis not present

## 2016-04-17 DIAGNOSIS — J189 Pneumonia, unspecified organism: Secondary | ICD-10-CM | POA: Diagnosis not present

## 2016-04-17 DIAGNOSIS — E871 Hypo-osmolality and hyponatremia: Secondary | ICD-10-CM | POA: Diagnosis not present

## 2016-04-17 DIAGNOSIS — J449 Chronic obstructive pulmonary disease, unspecified: Secondary | ICD-10-CM | POA: Diagnosis not present

## 2016-04-17 DIAGNOSIS — I5042 Chronic combined systolic (congestive) and diastolic (congestive) heart failure: Secondary | ICD-10-CM | POA: Diagnosis not present

## 2016-04-17 DIAGNOSIS — E785 Hyperlipidemia, unspecified: Secondary | ICD-10-CM | POA: Diagnosis not present

## 2016-04-17 DIAGNOSIS — I2510 Atherosclerotic heart disease of native coronary artery without angina pectoris: Secondary | ICD-10-CM | POA: Diagnosis not present

## 2016-04-17 DIAGNOSIS — N390 Urinary tract infection, site not specified: Secondary | ICD-10-CM | POA: Diagnosis not present

## 2016-04-17 DIAGNOSIS — E039 Hypothyroidism, unspecified: Secondary | ICD-10-CM | POA: Diagnosis not present

## 2016-04-17 DIAGNOSIS — M25559 Pain in unspecified hip: Secondary | ICD-10-CM | POA: Diagnosis not present

## 2016-04-17 DIAGNOSIS — I429 Cardiomyopathy, unspecified: Secondary | ICD-10-CM | POA: Diagnosis not present

## 2016-04-17 DIAGNOSIS — Z95810 Presence of automatic (implantable) cardiac defibrillator: Secondary | ICD-10-CM | POA: Diagnosis not present

## 2016-04-17 DIAGNOSIS — Z9181 History of falling: Secondary | ICD-10-CM | POA: Diagnosis not present

## 2016-04-17 DIAGNOSIS — Z85820 Personal history of malignant melanoma of skin: Secondary | ICD-10-CM | POA: Diagnosis not present

## 2016-04-17 DIAGNOSIS — Z8673 Personal history of transient ischemic attack (TIA), and cerebral infarction without residual deficits: Secondary | ICD-10-CM | POA: Diagnosis not present

## 2016-04-17 DIAGNOSIS — I509 Heart failure, unspecified: Secondary | ICD-10-CM | POA: Diagnosis not present

## 2016-04-17 DIAGNOSIS — J440 Chronic obstructive pulmonary disease with acute lower respiratory infection: Secondary | ICD-10-CM | POA: Diagnosis not present

## 2016-04-17 DIAGNOSIS — Z7982 Long term (current) use of aspirin: Secondary | ICD-10-CM | POA: Diagnosis not present

## 2016-04-17 DIAGNOSIS — Z7902 Long term (current) use of antithrombotics/antiplatelets: Secondary | ICD-10-CM | POA: Diagnosis not present

## 2016-04-17 DIAGNOSIS — M25579 Pain in unspecified ankle and joints of unspecified foot: Secondary | ICD-10-CM | POA: Diagnosis not present

## 2016-04-20 ENCOUNTER — Inpatient Hospital Stay
Admission: EM | Admit: 2016-04-20 | Discharge: 2016-04-26 | DRG: 247 | Disposition: A | Discharge status: Skilled Nursing Facility | Payer: PPO | Attending: Internal Medicine | Admitting: Internal Medicine

## 2016-04-20 ENCOUNTER — Emergency Department: Payer: PPO

## 2016-04-20 ENCOUNTER — Encounter: Payer: Self-pay | Admitting: Emergency Medicine

## 2016-04-20 DIAGNOSIS — E041 Nontoxic single thyroid nodule: Secondary | ICD-10-CM | POA: Diagnosis not present

## 2016-04-20 DIAGNOSIS — I252 Old myocardial infarction: Secondary | ICD-10-CM

## 2016-04-20 DIAGNOSIS — Z95810 Presence of automatic (implantable) cardiac defibrillator: Secondary | ICD-10-CM | POA: Diagnosis not present

## 2016-04-20 DIAGNOSIS — Z85828 Personal history of other malignant neoplasm of skin: Secondary | ICD-10-CM

## 2016-04-20 DIAGNOSIS — E785 Hyperlipidemia, unspecified: Secondary | ICD-10-CM | POA: Diagnosis present

## 2016-04-20 DIAGNOSIS — I509 Heart failure, unspecified: Secondary | ICD-10-CM | POA: Diagnosis not present

## 2016-04-20 DIAGNOSIS — Z8249 Family history of ischemic heart disease and other diseases of the circulatory system: Secondary | ICD-10-CM

## 2016-04-20 DIAGNOSIS — Z823 Family history of stroke: Secondary | ICD-10-CM

## 2016-04-20 DIAGNOSIS — H5442 Blindness, left eye, normal vision right eye: Secondary | ICD-10-CM | POA: Diagnosis present

## 2016-04-20 DIAGNOSIS — I5042 Chronic combined systolic (congestive) and diastolic (congestive) heart failure: Secondary | ICD-10-CM | POA: Diagnosis present

## 2016-04-20 DIAGNOSIS — I255 Ischemic cardiomyopathy: Secondary | ICD-10-CM | POA: Diagnosis present

## 2016-04-20 DIAGNOSIS — E559 Vitamin D deficiency, unspecified: Secondary | ICD-10-CM | POA: Diagnosis present

## 2016-04-20 DIAGNOSIS — R778 Other specified abnormalities of plasma proteins: Secondary | ICD-10-CM

## 2016-04-20 DIAGNOSIS — N390 Urinary tract infection, site not specified: Secondary | ICD-10-CM | POA: Diagnosis present

## 2016-04-20 DIAGNOSIS — I5022 Chronic systolic (congestive) heart failure: Secondary | ICD-10-CM | POA: Insufficient documentation

## 2016-04-20 DIAGNOSIS — J440 Chronic obstructive pulmonary disease with acute lower respiratory infection: Secondary | ICD-10-CM | POA: Diagnosis present

## 2016-04-20 DIAGNOSIS — I2510 Atherosclerotic heart disease of native coronary artery without angina pectoris: Secondary | ICD-10-CM | POA: Diagnosis not present

## 2016-04-20 DIAGNOSIS — Z880 Allergy status to penicillin: Secondary | ICD-10-CM | POA: Diagnosis not present

## 2016-04-20 DIAGNOSIS — R7989 Other specified abnormal findings of blood chemistry: Secondary | ICD-10-CM

## 2016-04-20 DIAGNOSIS — Z888 Allergy status to other drugs, medicaments and biological substances status: Secondary | ICD-10-CM

## 2016-04-20 DIAGNOSIS — N183 Chronic kidney disease, stage 3 (moderate): Secondary | ICD-10-CM | POA: Diagnosis not present

## 2016-04-20 DIAGNOSIS — R0602 Shortness of breath: Secondary | ICD-10-CM

## 2016-04-20 DIAGNOSIS — J189 Pneumonia, unspecified organism: Secondary | ICD-10-CM

## 2016-04-20 DIAGNOSIS — E039 Hypothyroidism, unspecified: Secondary | ICD-10-CM | POA: Diagnosis present

## 2016-04-20 DIAGNOSIS — I214 Non-ST elevation (NSTEMI) myocardial infarction: Secondary | ICD-10-CM | POA: Diagnosis not present

## 2016-04-20 DIAGNOSIS — R42 Dizziness and giddiness: Secondary | ICD-10-CM

## 2016-04-20 DIAGNOSIS — Z7902 Long term (current) use of antithrombotics/antiplatelets: Secondary | ICD-10-CM

## 2016-04-20 DIAGNOSIS — Z7982 Long term (current) use of aspirin: Secondary | ICD-10-CM

## 2016-04-20 DIAGNOSIS — R296 Repeated falls: Secondary | ICD-10-CM | POA: Diagnosis not present

## 2016-04-20 DIAGNOSIS — Z79899 Other long term (current) drug therapy: Secondary | ICD-10-CM

## 2016-04-20 DIAGNOSIS — I272 Other secondary pulmonary hypertension: Secondary | ICD-10-CM | POA: Diagnosis present

## 2016-04-20 DIAGNOSIS — I130 Hypertensive heart and chronic kidney disease with heart failure and stage 1 through stage 4 chronic kidney disease, or unspecified chronic kidney disease: Secondary | ICD-10-CM | POA: Diagnosis not present

## 2016-04-20 DIAGNOSIS — J849 Interstitial pulmonary disease, unspecified: Secondary | ICD-10-CM | POA: Diagnosis present

## 2016-04-20 DIAGNOSIS — Z853 Personal history of malignant neoplasm of breast: Secondary | ICD-10-CM

## 2016-04-20 DIAGNOSIS — R55 Syncope and collapse: Secondary | ICD-10-CM | POA: Diagnosis not present

## 2016-04-20 DIAGNOSIS — Z87891 Personal history of nicotine dependence: Secondary | ICD-10-CM | POA: Diagnosis not present

## 2016-04-20 DIAGNOSIS — Z8673 Personal history of transient ischemic attack (TIA), and cerebral infarction without residual deficits: Secondary | ICD-10-CM

## 2016-04-20 DIAGNOSIS — Z955 Presence of coronary angioplasty implant and graft: Secondary | ICD-10-CM

## 2016-04-20 DIAGNOSIS — J449 Chronic obstructive pulmonary disease, unspecified: Secondary | ICD-10-CM | POA: Diagnosis present

## 2016-04-20 DIAGNOSIS — I951 Orthostatic hypotension: Secondary | ICD-10-CM | POA: Diagnosis present

## 2016-04-20 DIAGNOSIS — Z881 Allergy status to other antibiotic agents status: Secondary | ICD-10-CM | POA: Diagnosis not present

## 2016-04-20 HISTORY — DX: Orthostatic hypotension: I95.1

## 2016-04-20 LAB — COMPREHENSIVE METABOLIC PANEL
ALBUMIN: 2.6 g/dL — AB (ref 3.5–5.0)
ALT: 30 U/L (ref 14–54)
ANION GAP: 8 (ref 5–15)
AST: 24 U/L (ref 15–41)
Alkaline Phosphatase: 167 U/L — ABNORMAL HIGH (ref 38–126)
BUN: 15 mg/dL (ref 6–20)
CALCIUM: 8.6 mg/dL — AB (ref 8.9–10.3)
CO2: 27 mmol/L (ref 22–32)
Chloride: 101 mmol/L (ref 101–111)
Creatinine, Ser: 1.24 mg/dL — ABNORMAL HIGH (ref 0.44–1.00)
GFR calc non Af Amer: 39 mL/min — ABNORMAL LOW (ref >60)
GFR, EST AFRICAN AMERICAN: 45 mL/min — AB (ref >60)
GLUCOSE: 109 mg/dL — AB (ref 65–99)
POTASSIUM: 4.5 mmol/L (ref 3.5–5.1)
SODIUM: 136 mmol/L (ref 135–145)
TOTAL PROTEIN: 6.9 g/dL (ref 6.5–8.1)
Total Bilirubin: 0.3 mg/dL (ref 0.3–1.2)

## 2016-04-20 LAB — APTT: APTT: 24 s (ref 24–36)

## 2016-04-20 LAB — CBC WITH DIFFERENTIAL/PLATELET
Basophils Absolute: 0.1 10*3/uL (ref 0–0.1)
EOS ABS: 0.1 10*3/uL (ref 0–0.7)
HCT: 30.5 % — ABNORMAL LOW (ref 35.0–47.0)
Hemoglobin: 10.3 g/dL — ABNORMAL LOW (ref 12.0–16.0)
LYMPHS ABS: 0.9 10*3/uL — AB (ref 1.0–3.6)
Lymphocytes Relative: 10 %
MCH: 30.6 pg (ref 26.0–34.0)
MCHC: 33.9 g/dL (ref 32.0–36.0)
MCV: 90.2 fL (ref 80.0–100.0)
MONO ABS: 0.2 10*3/uL (ref 0.2–0.9)
Neutro Abs: 8.6 10*3/uL — ABNORMAL HIGH (ref 1.4–6.5)
Neutrophils Relative %: 86 %
Platelets: 354 10*3/uL (ref 150–440)
RBC: 3.38 MIL/uL — ABNORMAL LOW (ref 3.80–5.20)
RDW: 14.8 % — AB (ref 11.5–14.5)
WBC: 9.9 10*3/uL (ref 3.6–11.0)

## 2016-04-20 LAB — URINALYSIS COMPLETE WITH MICROSCOPIC (ARMC ONLY)
BACTERIA UA: NONE SEEN
Bilirubin Urine: NEGATIVE
GLUCOSE, UA: NEGATIVE mg/dL
HGB URINE DIPSTICK: NEGATIVE
KETONES UR: NEGATIVE mg/dL
LEUKOCYTES UA: NEGATIVE
NITRITE: NEGATIVE
Protein, ur: NEGATIVE mg/dL
SPECIFIC GRAVITY, URINE: 1.006 (ref 1.005–1.030)
pH: 6 (ref 5.0–8.0)

## 2016-04-20 LAB — TSH: TSH: 2.237 u[IU]/mL (ref 0.350–4.500)

## 2016-04-20 LAB — LIPASE, BLOOD: Lipase: 33 U/L (ref 11–51)

## 2016-04-20 LAB — HEPARIN LEVEL (UNFRACTIONATED): Heparin Unfractionated: <0.1 IU/mL — ABNORMAL LOW (ref 0.30–0.70)

## 2016-04-20 LAB — TROPONIN I
TROPONIN I: 1.02 ng/mL — AB (ref <0.031)
Troponin I: 0.65 ng/mL — ABNORMAL HIGH (ref <0.031)
Troponin I: 0.7 ng/mL — ABNORMAL HIGH (ref <0.031)

## 2016-04-20 LAB — PROTIME-INR
INR: 1.12
Prothrombin Time: 14.6 seconds (ref 11.4–15.0)

## 2016-04-20 LAB — MAGNESIUM: MAGNESIUM: 2.1 mg/dL (ref 1.7–2.4)

## 2016-04-20 MED ORDER — DOCUSATE SODIUM 100 MG PO CAPS
100.0000 mg | ORAL_CAPSULE | Freq: Two times a day (BID) | ORAL | Status: DC
  Administered 2016-04-20 – 2016-04-26 (×9): 100 mg via ORAL
  Filled 2016-04-20 (×9): qty 1

## 2016-04-20 MED ORDER — METOPROLOL TARTRATE 25 MG PO TABS
25.0000 mg | ORAL_TABLET | Freq: Two times a day (BID) | ORAL | Status: DC
  Administered 2016-04-20 – 2016-04-21 (×3): 25 mg via ORAL
  Filled 2016-04-20 (×3): qty 1

## 2016-04-20 MED ORDER — SODIUM CHLORIDE 0.9% FLUSH
3.0000 mL | Freq: Two times a day (BID) | INTRAVENOUS | Status: DC
  Administered 2016-04-20 – 2016-04-23 (×4): 3 mL via INTRAVENOUS

## 2016-04-20 MED ORDER — ACETAMINOPHEN 500 MG PO TABS
500.0000 mg | ORAL_TABLET | Freq: Four times a day (QID) | ORAL | Status: DC | PRN
  Administered 2016-04-24: 500 mg via ORAL
  Filled 2016-04-20: qty 1

## 2016-04-20 MED ORDER — ONDANSETRON HCL 4 MG PO TABS
4.0000 mg | ORAL_TABLET | Freq: Four times a day (QID) | ORAL | Status: DC | PRN
Start: 2016-04-20 — End: 2016-04-26

## 2016-04-20 MED ORDER — HEPARIN SODIUM (PORCINE) 5000 UNIT/ML IJ SOLN
50.0000 [IU]/kg | Freq: Once | INTRAMUSCULAR | Status: AC
  Administered 2016-04-20: 3300 [IU] via INTRAVENOUS
  Filled 2016-04-20: qty 1

## 2016-04-20 MED ORDER — MOMETASONE FURO-FORMOTEROL FUM 200-5 MCG/ACT IN AERO
2.0000 | INHALATION_SPRAY | Freq: Two times a day (BID) | RESPIRATORY_TRACT | Status: DC
  Administered 2016-04-20 – 2016-04-26 (×11): 2 via RESPIRATORY_TRACT
  Filled 2016-04-20: qty 8.8

## 2016-04-20 MED ORDER — AZITHROMYCIN 250 MG PO TABS
250.0000 mg | ORAL_TABLET | Freq: Every day | ORAL | Status: AC
  Administered 2016-04-20 – 2016-04-22 (×3): 250 mg via ORAL
  Filled 2016-04-20 (×4): qty 1

## 2016-04-20 MED ORDER — SODIUM CHLORIDE 0.9 % IV BOLUS (SEPSIS): 500.0000 mL | INTRAVENOUS | Status: DC

## 2016-04-20 MED ORDER — ASPIRIN 81 MG PO CHEW
81.0000 mg | CHEWABLE_TABLET | Freq: Every day | ORAL | Status: DC
  Administered 2016-04-20 – 2016-04-26 (×7): 81 mg via ORAL
  Filled 2016-04-20 (×7): qty 1

## 2016-04-20 MED ORDER — ONDANSETRON HCL 4 MG/2ML IJ SOLN
4.0000 mg | Freq: Four times a day (QID) | INTRAMUSCULAR | Status: DC | PRN
Start: 2016-04-20 — End: 2016-04-26
  Administered 2016-04-23: 4 mg via INTRAVENOUS
  Filled 2016-04-20: qty 2

## 2016-04-20 MED ORDER — LEVOTHYROXINE SODIUM 75 MCG PO TABS
75.0000 ug | ORAL_TABLET | Freq: Every day | ORAL | Status: DC
Start: 2016-04-21 — End: 2016-04-26
  Administered 2016-04-21 – 2016-04-26 (×7): 75 ug via ORAL
  Filled 2016-04-20 (×7): qty 1

## 2016-04-20 MED ORDER — CLOPIDOGREL BISULFATE 75 MG PO TABS
75.0000 mg | ORAL_TABLET | Freq: Every day | ORAL | Status: DC
  Administered 2016-04-20 – 2016-04-26 (×6): 75 mg via ORAL
  Filled 2016-04-20 (×7): qty 1

## 2016-04-20 MED ORDER — PRAVASTATIN SODIUM 20 MG PO TABS
10.0000 mg | ORAL_TABLET | Freq: Every day | ORAL | Status: DC
  Administered 2016-04-20 – 2016-04-25 (×6): 10 mg via ORAL
  Filled 2016-04-20 (×6): qty 1

## 2016-04-20 MED ORDER — LORAZEPAM 0.5 MG PO TABS
1.0000 mg | ORAL_TABLET | Freq: Every evening | ORAL | Status: DC | PRN
  Administered 2016-04-20 – 2016-04-25 (×6): 1 mg via ORAL
  Filled 2016-04-20: qty 1
  Filled 2016-04-20: qty 2
  Filled 2016-04-20 (×3): qty 1
  Filled 2016-04-20: qty 2
  Filled 2016-04-20: qty 1

## 2016-04-20 MED ORDER — ASPIRIN 81 MG PO CHEW
324.0000 mg | CHEWABLE_TABLET | Freq: Once | ORAL | Status: AC
  Administered 2016-04-20: 324 mg via ORAL
  Filled 2016-04-20: qty 4

## 2016-04-20 MED ORDER — SODIUM CHLORIDE 0.9 % IV BOLUS (SEPSIS)
500.0000 mL | INTRAVENOUS | Status: AC
  Administered 2016-04-20: 500 mL via INTRAVENOUS

## 2016-04-20 MED ORDER — HEPARIN BOLUS VIA INFUSION
1900.0000 [IU] | Freq: Once | INTRAVENOUS | Status: AC
  Administered 2016-04-21: 1900 [IU] via INTRAVENOUS
  Filled 2016-04-20: qty 1900

## 2016-04-20 MED ORDER — HEPARIN (PORCINE) IN NACL 100-0.45 UNIT/ML-% IJ SOLN
1100.0000 [IU]/h | INTRAMUSCULAR | Status: DC
  Administered 2016-04-20: 750 [IU]/h via INTRAVENOUS
  Administered 2016-04-21: 1000 [IU]/h via INTRAVENOUS
  Administered 2016-04-22: 1100 [IU]/h via INTRAVENOUS
  Filled 2016-04-20 (×5): qty 250

## 2016-04-20 MED ORDER — ADULT MULTIVITAMIN W/MINERALS CH
1.0000 | ORAL_TABLET | Freq: Every day | ORAL | Status: DC
  Administered 2016-04-20 – 2016-04-26 (×6): 1 via ORAL
  Filled 2016-04-20 (×6): qty 1

## 2016-04-20 MED ORDER — HYDROCOD POLST-CPM POLST ER 10-8 MG/5ML PO SUER
5.0000 mL | Freq: Two times a day (BID) | ORAL | Status: DC | PRN
  Administered 2016-04-20 – 2016-04-24 (×5): 5 mL via ORAL
  Filled 2016-04-20 (×5): qty 5

## 2016-04-20 MED ORDER — FUROSEMIDE 20 MG PO TABS
20.0000 mg | ORAL_TABLET | Freq: Every day | ORAL | Status: DC | PRN
Start: 2016-04-20 — End: 2016-04-22

## 2016-04-20 MED ORDER — CHOLECALCIFEROL 50 MCG (2000 UT) PO CAPS: 1.0000 | ORAL_CAPSULE | Freq: Every day | ORAL | Status: DC

## 2016-04-20 MED ORDER — CEPHALEXIN 500 MG PO CAPS
500.0000 mg | ORAL_CAPSULE | Freq: Two times a day (BID) | ORAL | Status: AC
  Administered 2016-04-20 – 2016-04-22 (×5): 500 mg via ORAL
  Filled 2016-04-20 (×5): qty 1

## 2016-04-20 MED ORDER — VITAMIN D 1000 UNITS PO TABS
1000.0000 [IU] | ORAL_TABLET | Freq: Every day | ORAL | Status: DC
  Administered 2016-04-20 – 2016-04-26 (×6): 1000 [IU] via ORAL
  Filled 2016-04-20 (×6): qty 1

## 2016-04-20 MED ORDER — IOPAMIDOL (ISOVUE-370) INJECTION 76%
70.0000 mL | Freq: Once | INTRAVENOUS | Status: AC | PRN
  Administered 2016-04-20: 70 mL via INTRAVENOUS

## 2016-04-20 MED ORDER — MULTIVITAMIN GUMMIES ADULT PO CHEW: CHEWABLE_TABLET | Freq: Every day | ORAL | Status: DC

## 2016-04-20 NOTE — ED Notes (Signed)
Unable to establish iv access or obtained labs - dr aware.

## 2016-04-20 NOTE — H&P (Signed)
[facility] NAME: Erica [name]    MR#:  [id number]  DATE OF BIRTH:  1930/12/08  DATE OF ADMISSION:  04/20/2016  PRIMARY CARE PHYSICIAN: Kirk Ruths., MD   REQUESTING/REFERRING PHYSICIAN: Dr. Hinda Kehr  CHIEF COMPLAINT:   Chief Complaint  Patient presents with  . Shortness of Breath    HISTORY OF PRESENT ILLNESS:  Erica [name]  is a 82 y.o. female with a known history of CAD status post stents, TIA/CVA in April 2017, combined ischemic cardiomyopathy with EF of 35% status post ICD and pacemaker, chronic interstitial lung disease, hypertension presents to the hospital from [facility] secondary to recurrent dizziness and syncopal episodes.  She was admitted twice within the last month. Known history of falls and also dizziness upon standing. Tested positive for orthostatic hypotension and was discharged on as needed midodrine in April 2017. She was here about 4 days ago for UTI and pneumonia and is still on her antibiotics. Over the last 2-3 days, she has had multiple episodes where she stood up to get to the bathroom and by the time she got to the bathroom she was noted to be visibly short of breath and also had presyncopal episodes. So the nurses have been assisting her. This morning she had a syncopal episode and was brought to the emergency room. Denies any chest pain. Feels the aura prior to passing out. Troponin noted to be elevated. EKG with paced rhythm but tachycardic. CT of the chest negative for PE but some consolidative changes from her recent pneumonia.  PAST MEDICAL HISTORY:   Past Medical History  Diagnosis Date  . Nonischemic cardiomyopathy ([place])     a. s/p Medtronic CRT-D, NYHA II-III at baseline; b. echo 2011: EF 43%, mildly dilated LA, PASP 72mm Hg; c. echo 3/16: EF 35-40%, GR1DD, mild to mod AI, mild MR, PASP 65 mmHg  . Coronary artery disease     a. s/p prior stent-RCA; b. cath  6/14: mild to mod prox to mid LAD dz, mod prox to dRCA at 60% unchanged from prior cath; c. nuc 3/16: no sig ischemia, nl WM, EF 19%  . History of MI (myocardial infarction)   . Dyslipidemia   . Hypertension   . COPD (chronic obstructive pulmonary disease) ([place])   . Hypothyroidism   . Basal cell carcinoma 2011  . Respiratory infection   . Vitamin D deficiency   . Breast cancer ([place]) 30+ yrs. ago    right breast, radiation  . TIA (transient ischemic attack) 02/2016    a. negative head CT x 2, unable to have MRI brain given ICD; b. carotid doppler with 0-49% stenosis bilaterally with antegrade flow bilaterally   . Blind left eye   . Frequent falls   . LBBB (left bundle branch block)   . CHF (congestive heart failure) ([place])     PAST SURGICAL HISTORY:   Past Surgical History  Procedure Laterality Date  . Cardiac defibrillator placement    . Insert / replace / remove pacemaker    . Mastectomy    . Abdominal hysterectomy    . Basal cell carcinoma excision      Monh's procedure  . Cardiac catheterization  2014  . Ep implantable device N/A 04/05/2015    Procedure: BIV ICD Generator Changeout;  Surgeon: Deboraha Sprang, MD;  Location: [facility];  Service: Cardiovascular;  Laterality: N/A;  . Breast excisional biopsy Right 30+yrs  ago    positive    SOCIAL HISTORY:   Social History  Substance Use Topics  . Smoking status: Former Research scientist (life sciences)  . Smokeless tobacco: Never Used  . Alcohol Use: No    FAMILY HISTORY:   Family History  Problem Relation Age of Onset  . Cancer Other   . Coronary artery disease Other   . Hypertension Other   . Hypertension Mother   . Stroke Mother   . Hypertension Father   . Stroke Father     DRUG ALLERGIES:   Allergies  Allergen Reactions  . Ciprofloxacin Anaphylaxis and Hives  . Ace Inhibitors Other (See Comments)    Reaction:  Unknown   . Amlodipine-Atorvastatin Other (See Comments)    Reaction:  Unknown   . Atorvastatin Other (See  Comments)    Muscle cramps  . Amoxicillin Itching, Rash and Other (See Comments)    Has patient had a PCN reaction causing immediate rash, facial/tongue/throat swelling, SOB or lightheadedness with hypotension: No Has patient had a PCN reaction causing severe rash involving mucus membranes or skin necrosis: No Has patient had a PCN reaction that required hospitalization No Has patient had a PCN reaction occurring within the last 10 years: Yes If all of the above answers are "NO", then may proceed with Cephalosporin use.  . Quinolones Itching and Rash    REVIEW OF SYSTEMS:   Review of Systems  Constitutional: Positive for malaise/fatigue. Negative for fever, chills and weight loss.  HENT: Negative for ear discharge, ear pain, hearing loss and nosebleeds.   Eyes: Negative for blurred vision, double vision and photophobia.  Respiratory: Positive for cough and shortness of breath. Negative for hemoptysis and wheezing.   Cardiovascular: Negative for chest pain, palpitations, orthopnea and leg swelling.  Gastrointestinal: Negative for heartburn, nausea, vomiting, abdominal pain, diarrhea, constipation and melena.  Genitourinary: Negative for dysuria, urgency and frequency.  Musculoskeletal: Negative for myalgias, back pain and neck pain.  Skin: Negative for rash.  Neurological: Positive for dizziness. Negative for tingling, sensory change, speech change, focal weakness and headaches.  Endo/Heme/Allergies: Does not bruise/bleed easily.  Psychiatric/Behavioral: Negative for depression.    MEDICATIONS AT HOME:   Prior to Admission medications   Medication Sig Start Date End Date Taking? Authorizing Provider  acetaminophen (TYLENOL) 325 MG tablet Take 325 mg by mouth every 6 (six) hours as needed.    Historical Provider, MD  aspirin 81 MG chewable tablet Chew 1 tablet (81 mg total) by mouth daily. 03/14/16   Gladstone Lighter, MD  azithromycin (ZITHROMAX) 250 MG tablet Take 1 tablet (250 mg  total) by mouth daily. 04/15/16   Srikar Sudini, MD  cephALEXin (KEFLEX) 500 MG capsule Take 1 capsule (500 mg total) by mouth 3 (three) times daily. 04/15/16   Srikar Sudini, MD  chlorpheniramine-HYDROcodone (TUSSIONEX PENNKINETIC ER) 10-8 MG/5ML SUER Take 5 mLs by mouth every 12 (twelve) hours as needed for cough. 04/15/16   Hillary Bow, MD  Cholecalciferol 2000 units CAPS Take 1 capsule by mouth daily.    Historical Provider, MD  clopidogrel (PLAVIX) 75 MG tablet Take 1 tablet (75 mg total) by mouth daily. 04/01/16   Ryan M Dunn, PA-C  Fluticasone-Salmeterol (ADVAIR DISKUS) 250-50 MCG/DOSE AEPB Inhale 1 puff into the lungs 2 (two) times daily. Patient taking differently: Inhale 1 puff into the lungs daily.  03/14/16   Gladstone Lighter, MD  furosemide (LASIX) 20 MG tablet Take 20 mg by mouth daily as needed for edema.    Historical Provider,  MD  levothyroxine (SYNTHROID, LEVOTHROID) 75 MCG tablet Take 75 mcg by mouth daily before breakfast.     Historical Provider, MD  LORazepam (ATIVAN) 1 MG tablet Take 1 tablet (1 mg total) by mouth at bedtime as needed for anxiety or sleep. 04/15/16   Srikar Sudini, MD  Multiple Vitamins-Minerals (MULTIVITAMIN GUMMIES ADULT PO) Take 1 each by mouth daily at 12 noon.     Historical Provider, MD  pravastatin (PRAVACHOL) 10 MG tablet Take 10 mg by mouth at bedtime.     Historical Provider, MD      VITAL SIGNS:  Blood pressure 136/68, pulse 100, resp. rate 24, height 5\' 2"  (1.575 m), weight 66.225 kg (146 lb), SpO2 92 %.  PHYSICAL EXAMINATION:   Physical Exam  GENERAL:  82 y.o.-year-old elderly patient lying in the bed with no acute distress.  EYES: Pupils equal, round, reactive to light and accommodation. No scleral icterus. Extraocular muscles intact.  HEENT: Head atraumatic, normocephalic. Oropharynx and nasopharynx clear.  NECK:  Supple, no jugular venous distention. No thyroid enlargement, no tenderness.  LUNGS: Normal breath sounds bilaterally, no  wheezing, rales,rhonchi or crepitation. No use of accessory muscles of respiration.  CARDIOVASCULAR: S1, S2 normal. No rubs, or gallops. 2/6 systolic murmur present AICD/pacemaker in place ABDOMEN: Soft, nontender, nondistended. Bowel sounds present. No organomegaly or mass.  EXTREMITIES: No pedal edema, cyanosis, or clubbing.  NEUROLOGIC: Cranial nerves II through XII are intact. Muscle strength 5/5 in all extremities. Sensation intact. Gait not checked.  PSYCHIATRIC: The patient is alert and oriented x 3.  SKIN: No obvious rash, lesion, or ulcer.   LABORATORY PANEL:   CBC  Recent Labs Lab 04/20/16 1219  WBC 9.9  HGB 10.3*  HCT 30.5*  PLT 354   ------------------------------------------------------------------------------------------------------------------  Chemistries   Recent Labs Lab 04/20/16 1219  NA 136  K 4.5  CL 101  CO2 27  GLUCOSE 109*  BUN 15  CREATININE 1.24*  CALCIUM 8.6*  MG 2.1  AST 24  ALT 30  ALKPHOS 167*  BILITOT 0.3   ------------------------------------------------------------------------------------------------------------------  Cardiac Enzymes  Recent Labs Lab 04/20/16 1219  TROPONINI 0.65*   ------------------------------------------------------------------------------------------------------------------  RADIOLOGY:  Dg Chest 2 View  04/20/2016  CLINICAL DATA:  Low oxygen saturation.  Shortness of breath. EXAM: CHEST  2 VIEW COMPARISON:  04/15/2016 FINDINGS: Left chest wall ICD is noted with leads in the right atrial appendage, coronary sinus and right ventricle. Moderate cardiac enlargement. There is a small left pleural effusion. Mild diffuse interstitial edema is superimposed upon chronic lung disease. IMPRESSION: 1. Cardiac enlargement, aortic atherosclerosis and mild CHF 2. Chronic lung disease. Electronically Signed   By: Kerby Moors M.D.   On: 04/20/2016 11:46   Ct Angio Chest Pe W/cm &/or Wo Cm  04/20/2016  CLINICAL DATA:   Shortness of breath and tachycardia. History of breast carcinoma EXAM: CT ANGIOGRAPHY CHEST WITH CONTRAST TECHNIQUE: Multidetector CT imaging of the chest was performed using the standard protocol during bolus administration of intravenous contrast. Multiplanar CT image reconstructions and MIPs were obtained to evaluate the vascular anatomy. CONTRAST:  70 mL Isovue 370 nonionic COMPARISON:  Chest radiograph April 20, 2016 ; chest CT October 19, 2009 FINDINGS: Mediastinum/Lymph Nodes: There is no demonstrable pulmonary embolus. There is no thoracic aortic aneurysm or dissection. The visualize great vessels show moderate calcification at their respective origins. There is atherosclerotic calcification throughout the aorta. There is extensive coronary artery calcification. Pericardium is not appreciably thickened. Heart is mildly enlarged. Pacemaker leads are  attached to the right atrium and right ventricle. The left lobe of the thyroid is diffusely enlarged and inhomogeneous in attenuation, a finding not present on prior study. There are scattered small mediastinal lymph nodes. There is no frank adenopathy on this study. Lungs/Pleura: There is extensive airspace consolidation in the posterior segment left upper lobe. A lesser degree of infiltrate is noted in the posterior segment of the right upper lobe. Several tiny interstitial fibrosis throughout the lungs bilaterally, primarily in the lower lobes but also to some extent in the upper lobes, slightly more severe on the right than on the left. There is probable mild interstitial edema superimposed. There is a small left pleural effusion. Upper abdomen: Visualized upper abdominal structures appear unremarkable except for atherosclerotic calcification in the abdominal aorta. Musculoskeletal: There are no blastic or lytic bone lesions. Review of the MIP images confirms the above findings. IMPRESSION: No demonstrable pulmonary embolus. Airspace consolidation in the  posterior segments of both upper lobes, more severe on the left than on the right. These areas rib consistent with pneumonia. Suspect mild interstitial edema. There is a small left effusion. There is a degree of underlying parenchymal fibrosis in both upper and lower lobes with relative lower lobe predominance. These changes progressed since the 2010 study. Cardiomegaly. Pacemaker leads attached to right atrium and right ventricle. The left lobe of the thyroid is diffusely enlarged and irregular and appearance. This finding represents a change compared to prior study. Nonemergent thyroid ultrasound is advised to assess for possible neoplasm arising from the left lobe of the thyroid. There are small lymph nodes present without frank adenopathy by size criteria. There is extensive atherosclerotic calcification. Extensive coronary artery calcification noted. Electronically Signed   By: Lowella Grip III M.D.   On: 04/20/2016 14:16    EKG:   Orders placed or performed during the hospital encounter of 04/20/16  . EKG 12-Lead  . EKG 12-Lead  . ED EKG  . ED EKG  . EKG 12-Lead  . EKG 12-Lead  . EKG 12-Lead  . EKG 12-Lead  . EKG 12-Lead  . EKG 12-Lead    IMPRESSION AND PLAN:   Erica [name]  is a 82 y.o. female with a known history of CAD status post stents, TIA/CVA in April 2017, combined ischemic cardiomyopathy with EF of 35% status post ICD and pacemaker, chronic interstitial lung disease, hypertension presents to the hospital from [facility] secondary to recurrent dizziness and syncopal episodes.  #1 NSTEMI- admit to telemetry, recycle troponins -Cardiology consulted. No EKG changes and no chest pain. -IV heparin started. Continue aspirin, Plavix and low-dose metoprolol added. -Also patient is on low dose statin.  #2 Recovering pneumonia- continue Keflex and azithromycin until 04/21/16  #3 Recurrent syncope, dizziness- had extensive workup done prior. MRI cannot be done  due to her pacemaker. Carotid Dopplers did not show any atherosclerotic occlusive disease. Patient is on aspirin and Plavix. -Has a pacemaker. Beta blocker was stopped and she is supposed to follow-up with Dr. Virl Axe this month. -Patient does have history of orthostatic blood pressure changes. Check orthostatics -Further management per cardiology. -Physical therapy consult. Daughter considering long-term care. -Social worker consulted  #4 Chronic combined CHF-not in any acute exacerbation. Last EF of 35%. -Continue low-dose Lasix for maintenance  #5 Interstitial lung disease-currently on 2 L oxygen. Stable. Outpatient pulmonary follow-up. Continue home inhalers.  #6 CKD stage 2-3-creatinine seems to be close to baseline at 1.1. Continue to monitor. Patient is on low  dose Lasix  #7 DVT Prophylaxis- on IV heparin for now    All the records are reviewed and case discussed with ED provider. Management plans discussed with the patient, family and they are in agreement.  CODE STATUS: Full code  TOTAL TIME TAKING CARE OF THIS PATIENT: 50 minutes.    Gladstone Lighter M.D on 04/20/2016 at 3:41 PM  Between 7am to 6pm - Pager - [phone]  After 6pm go to [email or web address] - password [email or web address] [facility]  Office  [phone]  CC: Primary care physician; Kirk Ruths., MD

## 2016-04-20 NOTE — ED Notes (Signed)
Pt report received - pt states has been having episodes of dizziness with standing and was placed on oxygen yesterday for an episode of vertigo on standing. Is at rehab for recovery from pneumonia and uti. Also hx of orthostatic hypotension. Denies pain. Pt states is worried the pneumonia getting worse since her spo2 for ems was 88%. Family at bedside.

## 2016-04-20 NOTE — Clinical Social Work Note (Signed)
Clinical Social Work Assessment  Patient Details  Name: Erica [name] MRN: [id number] Date of Birth: 04-Aug-1931  Date of referral:  04/20/16               Reason for consult:  Facility Placement                Permission sought to share information with:  Family Supports, Customer service manager Permission granted to share information::  Yes, Verbal Permission Granted  Name::     Skilled nursing facility and daughter Erica [name] [phone]  Agency::  [facility]  Relationship::     Contact Information:     Housing/Transportation Living arrangements for the past 2 months:  [place], [place] of Information:  Patient, Adult Children Patient Interpreter Needed:  None Criminal Activity/Legal Involvement Pertinent to Current Situation/Hospitalization:  No - Comment as needed Significant Relationships:  Adult Children Lives with:  Adult Children Do you feel safe going back to the place where you live?  Yes Need for family participation in patient care:  Yes (Comment) (Completed STR at [facility] ( used her days up))  Care giving concerns:  Needs LTC placements, medicaid applied for on May 30th, 2017   Social Worker assessment / plan: LCSW met with patient and daughter and introduced myself and requested verbal consent to speak to daughter. Patient agreed. Daughter is concerned about recent falls and used up PT days at [facility] and would like to find a longterm care facility for her Mom. Daughter has applied for medicaid last week. Daughter is agreeable to send out information via the HUB to SNF in the [place] are. Discussed other option, Private pay, home health options. Daughter stated not safe for her Mom to live in the basement apartment any more. She has scheduled appointments at [facility] on Tuesday of this week. Assessment completed and Fl2 will be faxed out for bed offers. Patient daughter has a good understanding of having her Mother placed  and will follow up with LCSW.  Employment status:  Retired Forensic scientist:   Medicare/ Medicaid pending ( applied on Tuesday May 30th) PT Recommendations:  Not assessed at this time Information / Referral to community resources:  [place]  Patient/Family's Response to care: They are very concerned from the recent falls and would like her to be going to Long term facility.  Patient/Family's Understanding of and Emotional Response to Diagnosis, Current Treatment, and Prognosis:  Patient is fully understanding of her health issues and dizziness and is in agreement with her daughter to go to SNF- [facility].  Emotional Assessment Appearance:  Appears younger than stated age Attitude/Demeanor/Rapport:   (Positive, happy and pleasant) Affect (typically observed):  Accepting, Adaptable, Hopeful Orientation:  Oriented to Self, Oriented to Place, Oriented to  Time, Oriented to Situation Alcohol / Substance use:  Never Used Psych involvement (Current and /or in the community):  No (Comment)  Discharge Needs  Concerns to be addressed:  Discharge Planning Concerns Readmission within the last 30 days:  Yes Current discharge risk:  Dependent with Mobility Barriers to Discharge:  Continued Medical Work up   Joana Reamer, LCSW 04/20/2016, 4:34 PM

## 2016-04-20 NOTE — Progress Notes (Signed)
Changed cephalexin 500 mg TID to BID based on renal function.  Lenis Noon, PharmD 8:47 PM

## 2016-04-20 NOTE — ED Notes (Addendum)
REDS VEST READING= 34% CHEST RULER=18.5  VEST FITTING TASKS: POSTURE= Sitting HEIGHT MARKER= Short CENTER STRIP= Aligned  COMMENTS:approved by [employer]

## 2016-04-20 NOTE — ED Notes (Signed)
Pt arrived by EMS from [facility] where she is receiving rehab after recent discharge for pneumonia and UTI. Upon arrival pt O2 stat at 88% per Fire Dept and EMS stats they had stats at 90+.

## 2016-04-20 NOTE — Progress Notes (Signed)
ANTICOAGULATION CONSULT NOTE - Initial Consult  Pharmacy Consult for heparin drip dosing and monitoring Indication: chest pain/ACS  Allergies  Allergen Reactions  . Ciprofloxacin Anaphylaxis and Hives  . Ace Inhibitors Other (See Comments)    Reaction:  Unknown   . Amlodipine-Atorvastatin Other (See Comments)    Reaction:  Unknown   . Atorvastatin Other (See Comments)    Muscle cramps  . Amoxicillin Itching, Rash and Other (See Comments)    Has patient had a PCN reaction causing immediate rash, facial/tongue/throat swelling, SOB or lightheadedness with hypotension: No Has patient had a PCN reaction causing severe rash involving mucus membranes or skin necrosis: No Has patient had a PCN reaction that required hospitalization No Has patient had a PCN reaction occurring within the last 10 years: Yes If all of the above answers are "NO", then may proceed with Cephalosporin use.  . Quinolones Itching and Rash    Patient Measurements: Height: 5\' 2"  (157.5 cm) (stated) Weight: 141 lb 3.2 oz (64.048 kg) (admission) IBW/kg (Calculated) : 50.1 Heparin Dosing Weight: 64 kg  Vital Signs: Temp: 97.5 F (36.4 C) (06/04 1928) Temp Source: Oral (06/04 1928) BP: 120/67 mmHg (06/04 1928) Pulse Rate: 87 (06/04 1928)  Labs:  Recent Labs  04/19/16 2241 04/20/16 1219 04/20/16 1822 04/20/16 2241  HGB  --  10.3*  --   --   HCT  --  30.5*  --   --   PLT  --  354  --   --   APTT  --  24  --   --   LABPROT  --  14.6  --   --   INR  --  1.12  --   --   HEPARINUNFRC  --   --   --  <0.10*  CREATININE  --  1.24*  --   --   TROPONINI 1.02* 0.65* 0.70*  --    Estimated Creatinine Clearance: 30.2 mL/min (by C-G formula based on Cr of 1.24).  Medical History: Past Medical History  Diagnosis Date  . Nonischemic cardiomyopathy ([place])     a. s/p Medtronic CRT-D, NYHA II-III at baseline; b. echo 2011: EF 43%, mildly dilated LA, PASP 41mm Hg; c. echo 3/16: EF 35-40%, GR1DD, mild to mod AI, mild  MR, PASP 65 mmHg  . Coronary artery disease     a. s/p prior stent-RCA; b. cath 6/14: mild to mod prox to mid LAD dz, mod prox to dRCA at 60% unchanged from prior cath; c. nuc 3/16: no sig ischemia, nl WM, EF 19%  . History of MI (myocardial infarction)   . Dyslipidemia   . Hypertension   . COPD (chronic obstructive pulmonary disease) ([place])   . Hypothyroidism   . Basal cell carcinoma 2011  . Respiratory infection   . Vitamin D deficiency   . Breast cancer ([place]) 30+ yrs. ago    right breast, radiation  . TIA (transient ischemic attack) 02/2016    a. negative head CT x 2, unable to have MRI brain given ICD; b. carotid doppler with 0-49% stenosis bilaterally with antegrade flow bilaterally   . Blind left eye   . Frequent falls   . LBBB (left bundle branch block)   . CHF (congestive heart failure) ([facility])    Medications:  Scheduled:  . aspirin  81 mg Oral Daily  . azithromycin  250 mg Oral Daily  . cephALEXin  500 mg Oral BID  . cholecalciferol  1,000 Units Oral Daily  . clopidogrel  75 mg Oral Daily  . docusate sodium  100 mg Oral BID  . [START ON 04/21/2016] heparin  1,900 Units Intravenous Once  . [START ON 04/21/2016] levothyroxine  75 mcg Oral Q0600  . metoprolol tartrate  25 mg Oral BID  . mometasone-formoterol  2 puff Inhalation BID  . multivitamin with minerals  1 tablet Oral Daily  . pravastatin  10 mg Oral QHS  . sodium chloride flush  3 mL Intravenous Q12H    Assessment: Pharmacy consulted to dose and monitor heparin drip in this 82 year old female with suspected NSTEMI. Patient was not taking anticoagulants prior to admission.  Baseline labs: Hgb 10.3 Plt 354  INR 1.12 APTT 24  Goal of Therapy:  Heparin level 0.3-0.7 units/ml Monitor platelets by anticoagulation protocol: Yes   Plan:  Give 3300 units bolus x 1 (MD ordered bolus of 50 units/kg) Start heparin infusion at 750 units/hr (~12 units/kg/hr) Check anti-Xa level in 8 hours and daily while on heparin Continue  to monitor H&H and platelets  6/4 PM heparin level <0.1. 1900 unit bolus and increase rate to 1000 units/hr. Recheck in 8 hours.  Eloise Harman, PharmD Clinical Pharmacist 04/20/2016,11:48 PM

## 2016-04-20 NOTE — ED Notes (Addendum)
Iv obtained with ultrasound by charge nurse. Hold fluids until chf vest test is completed.

## 2016-04-20 NOTE — ED Notes (Signed)
Dr requested i decrease the rate of ns from 250 / hr to 100 / hr

## 2016-04-20 NOTE — NC FL2 (Signed)
[place] LEVEL OF CARE SCREENING TOOL     IDENTIFICATION  Patient Name: Erica [name] Birthdate: 05/23/1931 Sex: female Admission Date (Current Location): 04/20/2016  [place] and [place] Number:  Engineering geologist and Address:  [facility], [address] [place], [place] [address]      Provider Number: [id number]  Attending Physician Name and Address:  Gladstone Lighter, MD  Relative Name and Phone Number:       Current Level of Care: Hospital Recommended Level of Care: [place] Prior Approval Number:    Date Approved/Denied:   PASRR Number: [id number]  Discharge Plan: SNF    Current Diagnoses: Patient Active Problem List   Diagnosis Date Noted  . NSTEMI (non-ST elevated myocardial infarction) ([place]) 04/20/2016  . Hypothyroidism 04/10/2016  . COPD (chronic obstructive pulmonary disease) ([place]) 04/10/2016  . UTI (lower urinary tract infection) 04/10/2016  . Hyponatremia 04/10/2016  . Blind left eye   . Frequent falls   . LBBB (left bundle branch block)   . TIA (transient ischemic attack)   . Chronic systolic CHF (congestive heart failure) ([place])   . Falls   . Leg weakness, bilateral   . Elevated troponin   . CVA (cerebral infarction) 03/12/2016  . Gait instability 01/08/2016  . Chronic systolic heart failure ([place]) 02/02/2015  . Cough 01/18/2014  . Weakness generalized 01/18/2014  . Acute bronchitis 12/28/2013  . Nonischemic cardiomyopathy ([place]) 12/28/2013  . Orthostatic hypotension 04/06/2012  . Biventricular implantable cardioverter-defibrillator in situ 04/04/2011  . HYPERLIPIDEMIA, MIXED 10/23/2009  . Essential hypertension 10/23/2009  . Coronary atherosclerosis 10/23/2009    Orientation RESPIRATION BLADDER Height & Weight     Self, Time, Situation, Place  Normal Incontinent Weight: 146 lb (66.225 kg) Height:  5\' 2"  (157.5 cm)  BEHAVIORAL SYMPTOMS/MOOD NEUROLOGICAL BOWEL NUTRITION STATUS   Incontinent Diet (Normal)  AMBULATORY STATUS COMMUNICATION OF NEEDS Skin   Extensive Assist Verbally Normal, Bruising                       Personal Care Assistance Level of Assistance  Bathing Bathing Assistance: Limited assistance Feeding assistance: Independent Dressing Assistance: Limited assistance     Functional Limitations Info    Sight Info: Adequate Hearing Info: Impaired Speech Info: Adequate    SPECIAL CARE FACTORS FREQUENCY  PT (By licensed PT), OT (By licensed OT)     PT Frequency: 5x week TBD OT Frequency: 3x week TBD            Contractures      Additional Factors Info  Code Status, Allergies Code Status Info: Full Allergies Info: Ciprofloxacin Ace inhibitors,Amloidipine,-Atorvastatin, Amoxicillin Quinolones           Current Medications (04/20/2016):  This is the current hospital active medication list Current Facility-Administered Medications  Medication Dose Route Frequency Provider Last Rate Last Dose  . heparin ADULT infusion 100 units/mL (25000 units/276mL sodium chloride 0.45%)  750 Units/hr Intravenous Continuous Lenis Noon, RPH 7.5 mL/hr at 04/20/16 1437 750 Units/hr at 04/20/16 1437  . metoprolol tartrate (LOPRESSOR) tablet 25 mg  25 mg Oral BID Gladstone Lighter, MD   25 mg at 04/20/16 1622   Current Outpatient Prescriptions  Medication Sig Dispense Refill  . acetaminophen (TYLENOL) 325 MG tablet Take 325 mg by mouth every 6 (six) hours as needed.    Marland Kitchen aspirin 81 MG chewable tablet Chew 1 tablet (81 mg total) by mouth daily. 30 tablet 2  .  azithromycin (ZITHROMAX) 250 MG tablet Take 1 tablet (250 mg total) by mouth daily. 4 each 0  . cephALEXin (KEFLEX) 500 MG capsule Take 1 capsule (500 mg total) by mouth 3 (three) times daily. 12 capsule 0  . chlorpheniramine-HYDROcodone (TUSSIONEX PENNKINETIC ER) 10-8 MG/5ML SUER Take 5 mLs by mouth every 12 (twelve) hours as needed for cough. 115 mL 0  . Cholecalciferol 2000 units CAPS Take 1  capsule by mouth daily.    . clopidogrel (PLAVIX) 75 MG tablet Take 1 tablet (75 mg total) by mouth daily. 30 tablet 2  . Fluticasone-Salmeterol (ADVAIR DISKUS) 250-50 MCG/DOSE AEPB Inhale 1 puff into the lungs 2 (two) times daily. (Patient taking differently: Inhale 1 puff into the lungs daily. ) 60 each 1  . furosemide (LASIX) 20 MG tablet Take 20 mg by mouth daily as needed for edema.    Marland Kitchen levothyroxine (SYNTHROID, LEVOTHROID) 75 MCG tablet Take 75 mcg by mouth daily before breakfast.     . LORazepam (ATIVAN) 1 MG tablet Take 1 tablet (1 mg total) by mouth at bedtime as needed for anxiety or sleep. 15 tablet 0  . Multiple Vitamins-Minerals (MULTIVITAMIN GUMMIES ADULT PO) Take 1 each by mouth daily at 12 noon.     . pravastatin (PRAVACHOL) 10 MG tablet Take 10 mg by mouth at bedtime.   0     Discharge Medications: Please see discharge summary for a list of discharge medications.  Relevant Imaging Results:  Relevant Lab Results:   Additional Information SSN [id number] [place], [place]

## 2016-04-20 NOTE — ED Provider Notes (Signed)
[facility] Emergency Department Provider Note  ____________________________________________  Time seen: Approximately 11:18 AM  I have reviewed the triage vital signs and the nursing notes.   HISTORY  Chief Complaint Shortness of Breath    HPI Rodneshia Petak is a 82 y.o. female with a history of orthostatic hypotension and a recent admission for both pneumonia and a urinary tract infection who is now at [facility] for rehabilitation who presents by EMS for evaluation of a near syncopal episode.  Reportedly she was short of breath and almost passed out when she stood up to go to the bathroom.  However the patient states that this is common for her and has been increasingly common recently.  They put her on oxygen after they found that her oxygen saturation was 88% on room air but reportedly she uses when necessary oxygen since her recent hospital discharge. She is still taking antibiotics for her recent infectious diagnoses.  She denies fever/chills, chest pain, nausea/vomiting, abdominal pain, dysuria, diarrhea.  She states that the severity of the episode was severe but she feels asymptomatic at this time.  She has a pacer defibrillator in place and known history of cardiomyopathy with a last recorded EF of reportedly 35-40%.    Past Medical History  Diagnosis Date  . Nonischemic cardiomyopathy ([place])     a. s/p Medtronic CRT-D, NYHA II-III at baseline; b. echo 2011: EF 43%, mildly dilated LA, PASP 7mm Hg; c. echo 3/16: EF 35-40%, GR1DD, mild to mod AI, mild MR, PASP 65 mmHg  . Coronary artery disease     a. s/p prior stent-RCA; b. cath 6/14: mild to mod prox to mid LAD dz, mod prox to dRCA at 60% unchanged from prior cath; c. nuc 3/16: no sig ischemia, nl WM, EF 19%  . History of MI (myocardial infarction)   . Dyslipidemia   . Hypertension   . COPD (chronic obstructive pulmonary disease) ([place])   . Hypothyroidism   . Basal cell carcinoma 2011  .  Respiratory infection   . Vitamin D deficiency   . Breast cancer ([place]) 30+ yrs. ago    right breast, radiation  . TIA (transient ischemic attack) 02/2016    a. negative head CT x 2, unable to have MRI brain given ICD; b. carotid doppler with 0-49% stenosis bilaterally with antegrade flow bilaterally   . Blind left eye   . Frequent falls   . LBBB (left bundle branch block)   . CHF (congestive heart failure) [facility])     Patient Active Problem List   Diagnosis Date Noted  . Hypothyroidism 04/10/2016  . COPD (chronic obstructive pulmonary disease) ([place]) 04/10/2016  . UTI (lower urinary tract infection) 04/10/2016  . Hyponatremia 04/10/2016  . Blind left eye   . Frequent falls   . LBBB (left bundle branch block)   . TIA (transient ischemic attack)   . Chronic systolic CHF (congestive heart failure) ([place])   . Falls   . Leg weakness, bilateral   . Elevated troponin   . CVA (cerebral infarction) 03/12/2016  . Gait instability 01/08/2016  . Chronic systolic heart failure ([place]) 02/02/2015  . Cough 01/18/2014  . Weakness generalized 01/18/2014  . Acute bronchitis 12/28/2013  . Nonischemic cardiomyopathy ([place]) 12/28/2013  . Orthostatic hypotension 04/06/2012  . Biventricular implantable cardioverter-defibrillator in situ 04/04/2011  . HYPERLIPIDEMIA, MIXED 10/23/2009  . Essential hypertension 10/23/2009  . Coronary atherosclerosis 10/23/2009    Past Surgical History  Procedure Laterality Date  .  Cardiac defibrillator placement    . Insert / replace / remove pacemaker    . Mastectomy    . Abdominal hysterectomy    . Basal cell carcinoma excision      Monh's procedure  . Cardiac catheterization  2014  . Ep implantable device N/A 04/05/2015    Procedure: BIV ICD Generator Changeout;  Surgeon: Deboraha Sprang, MD;  Location: [facility];  Service: Cardiovascular;  Laterality: N/A;  . Breast excisional biopsy Right 30+yrs ago    positive    Current Outpatient Rx  Name  Route   Sig  Dispense  Refill  . acetaminophen (TYLENOL) 325 MG tablet   Oral   Take 325 mg by mouth every 6 (six) hours as needed.         Marland Kitchen aspirin 81 MG chewable tablet   Oral   Chew 1 tablet (81 mg total) by mouth daily.   30 tablet   2   . azithromycin (ZITHROMAX) 250 MG tablet   Oral   Take 1 tablet (250 mg total) by mouth daily.   4 each   0   . cephALEXin (KEFLEX) 500 MG capsule   Oral   Take 1 capsule (500 mg total) by mouth 3 (three) times daily.   12 capsule   0   . chlorpheniramine-HYDROcodone (TUSSIONEX PENNKINETIC ER) 10-8 MG/5ML SUER   Oral   Take 5 mLs by mouth every 12 (twelve) hours as needed for cough.   115 mL   0   . Cholecalciferol 2000 units CAPS   Oral   Take 1 capsule by mouth daily.         . clopidogrel (PLAVIX) 75 MG tablet   Oral   Take 1 tablet (75 mg total) by mouth daily.   30 tablet   2   . Fluticasone-Salmeterol (ADVAIR DISKUS) 250-50 MCG/DOSE AEPB   Inhalation   Inhale 1 puff into the lungs 2 (two) times daily. Patient taking differently: Inhale 1 puff into the lungs daily.    60 each   1   . furosemide (LASIX) 20 MG tablet   Oral   Take 20 mg by mouth daily as needed for edema.         Marland Kitchen levothyroxine (SYNTHROID, LEVOTHROID) 75 MCG tablet   Oral   Take 75 mcg by mouth daily before breakfast.          . LORazepam (ATIVAN) 1 MG tablet   Oral   Take 1 tablet (1 mg total) by mouth at bedtime as needed for anxiety or sleep.   15 tablet   0   . Multiple Vitamins-Minerals (MULTIVITAMIN GUMMIES ADULT PO)   Oral   Take 1 each by mouth daily at 12 noon.          . pravastatin (PRAVACHOL) 10 MG tablet   Oral   Take 10 mg by mouth at bedtime.       0     Allergies Ciprofloxacin; Ace inhibitors; Amlodipine-atorvastatin; Atorvastatin; Amoxicillin; and Quinolones  Family History  Problem Relation Age of Onset  . Cancer Other   . Coronary artery disease Other   . Hypertension Other   . Hypertension Mother   .  Stroke Mother   . Hypertension Father   . Stroke Father     Social History Social History  Substance Use Topics  . Smoking status: Former Research scientist (life sciences)  . Smokeless tobacco: Never Used  . Alcohol Use: No    Review of  Systems Constitutional: No fever/chills Eyes: No visual changes. ENT: No sore throat. Cardiovascular: Denies chest pain. Respiratory: +shortness of breath. Gastrointestinal: No abdominal pain.  No nausea, no vomiting.  No diarrhea.  No constipation. Genitourinary: Negative for dysuria. Musculoskeletal: Negative for back pain. Skin: Negative for rash. Neurological: Negative for headaches, focal weakness or numbness.  Near syncopal episode.  10-point ROS otherwise negative.  ____________________________________________   PHYSICAL EXAM:  VITAL SIGNS: ED Triage Vitals  Enc Vitals Group     BP 04/20/16 1108 137/77 mmHg     Pulse Rate 04/20/16 1108 104     Resp 04/20/16 1108 26     Temp --      Temp src --      SpO2 04/20/16 1108 94 %     Weight 04/20/16 1108 146 lb (66.225 kg)     Height 04/20/16 1108 5\' 2"  (1.575 m)     Head Cir --      Peak Flow --      Pain Score 04/20/16 1104 0     Pain Loc --      Pain Edu? --      Excl. in [place]? --    Constitutional: Alert and oriented. Well appearing and in no acute distress. Eyes: Conjunctivae are normal. PERRL. EOMI. Head: Atraumatic. Nose: No congestion/rhinnorhea. Mouth/Throat: Mucous membranes are moist.  Oropharynx non-erythematous. Neck: No stridor.  No meningeal signs.   Cardiovascular: Mild tachycardia, with regular rhythm.Kermit Balo peripheral circulation. Grossly normal heart sounds.   Respiratory: Normal respiratory effort.  No retractions. Lungs CTAB. Gastrointestinal: Soft and nontender. No distention.  Musculoskeletal: No lower extremity tenderness nor edema. No gross deformities of extremities. Neurologic:  Normal speech and language. No gross focal neurologic deficits are appreciated.  Skin:  Skin is  warm, dry and intact. No rash noted. Psychiatric: Mood and affect are normal. Speech and behavior are normal.  ____________________________________________   LABS (all labs ordered are listed, but only abnormal results are displayed)  Labs Reviewed  CBC WITH DIFFERENTIAL/PLATELET - Abnormal; Notable for the following:    RBC 3.38 (*)    Hemoglobin 10.3 (*)    HCT 30.5 (*)    RDW 14.8 (*)    Neutro Abs 8.6 (*)    Lymphs Abs 0.9 (*)    All other components within normal limits  COMPREHENSIVE METABOLIC PANEL - Abnormal; Notable for the following:    Glucose, Bld 109 (*)    Creatinine, Ser 1.24 (*)    Calcium 8.6 (*)    Albumin 2.6 (*)    Alkaline Phosphatase 167 (*)    GFR calc non Af Amer 39 (*)    GFR calc Af Amer 45 (*)    All other components within normal limits  TROPONIN I - Abnormal; Notable for the following:    Troponin I 0.65 (*)    All other components within normal limits  URINALYSIS COMPLETEWITH MICROSCOPIC ([facility] ONLY) - Abnormal; Notable for the following:    Color, Urine YELLOW (*)    APPearance CLEAR (*)    Squamous Epithelial / LPF 0-5 (*)    All other components within normal limits  MAGNESIUM  LIPASE, BLOOD  PROTIME-INR  APTT  HEPARIN LEVEL (UNFRACTIONATED)   ____________________________________________  EKG  ED ECG REPORT I, Dally Oshel, the attending physician, personally viewed and interpreted this ECG.   Date: 04/20/2016  EKG Time: 11:07  Rate: 104  Rhythm: Atrial sensed ventricular paced rhythm  Axis: Right axis deviation  Intervals:No significant change  from prior EKG about one week ago  ST&T Change: No evidence of acute ischemia  ____________________________________________  RADIOLOGY   Dg Chest 2 View  04/20/2016  CLINICAL DATA:  Low oxygen saturation.  Shortness of breath. EXAM: CHEST  2 VIEW COMPARISON:  04/15/2016 FINDINGS: Left chest wall ICD is noted with leads in the right atrial appendage, coronary sinus and right  ventricle. Moderate cardiac enlargement. There is a small left pleural effusion. Mild diffuse interstitial edema is superimposed upon chronic lung disease. IMPRESSION: 1. Cardiac enlargement, aortic atherosclerosis and mild CHF 2. Chronic lung disease. Electronically Signed   By: Kerby Moors M.D.   On: 04/20/2016 11:46   Ct Angio Chest Pe W/cm &/or Wo Cm  04/20/2016  CLINICAL DATA:  Shortness of breath and tachycardia. History of breast carcinoma EXAM: CT ANGIOGRAPHY CHEST WITH CONTRAST TECHNIQUE: Multidetector CT imaging of the chest was performed using the standard protocol during bolus administration of intravenous contrast. Multiplanar CT image reconstructions and MIPs were obtained to evaluate the vascular anatomy. CONTRAST:  70 mL Isovue 370 nonionic COMPARISON:  Chest radiograph April 20, 2016 ; chest CT October 19, 2009 FINDINGS: Mediastinum/Lymph Nodes: There is no demonstrable pulmonary embolus. There is no thoracic aortic aneurysm or dissection. The visualize great vessels show moderate calcification at their respective origins. There is atherosclerotic calcification throughout the aorta. There is extensive coronary artery calcification. Pericardium is not appreciably thickened. Heart is mildly enlarged. Pacemaker leads are attached to the right atrium and right ventricle. The left lobe of the thyroid is diffusely enlarged and inhomogeneous in attenuation, a finding not present on prior study. There are scattered small mediastinal lymph nodes. There is no frank adenopathy on this study. Lungs/Pleura: There is extensive airspace consolidation in the posterior segment left upper lobe. A lesser degree of infiltrate is noted in the posterior segment of the right upper lobe. Several tiny interstitial fibrosis throughout the lungs bilaterally, primarily in the lower lobes but also to some extent in the upper lobes, slightly more severe on the right than on the left. There is probable mild interstitial edema  superimposed. There is a small left pleural effusion. Upper abdomen: Visualized upper abdominal structures appear unremarkable except for atherosclerotic calcification in the abdominal aorta. Musculoskeletal: There are no blastic or lytic bone lesions. Review of the MIP images confirms the above findings. IMPRESSION: No demonstrable pulmonary embolus. Airspace consolidation in the posterior segments of both upper lobes, more severe on the left than on the right. These areas rib consistent with pneumonia. Suspect mild interstitial edema. There is a small left effusion. There is a degree of underlying parenchymal fibrosis in both upper and lower lobes with relative lower lobe predominance. These changes progressed since the 2010 study. Cardiomegaly. Pacemaker leads attached to right atrium and right ventricle. The left lobe of the thyroid is diffusely enlarged and irregular and appearance. This finding represents a change compared to prior study. Nonemergent thyroid ultrasound is advised to assess for possible neoplasm arising from the left lobe of the thyroid. There are small lymph nodes present without frank adenopathy by size criteria. There is extensive atherosclerotic calcification. Extensive coronary artery calcification noted. Electronically Signed   By: Lowella Grip III M.D.   On: 04/20/2016 14:16    ____________________________________________   PROCEDURES  Procedure(s) performed: None  Critical Care performed: Yes, see critical care note(s)   CRITICAL CARE Performed by: Hinda Kehr   Total critical care time: 45 minutes  Critical care time was exclusive of separately billable  procedures and treating other patients.  Critical care was necessary to treat or prevent imminent or life-threatening deterioration.  Critical care was time spent personally by me on the following activities: development of treatment plan with patient and/or surrogate as well as nursing, discussions with  consultants, evaluation of patient's response to treatment, examination of patient, obtaining history from patient or surrogate, ordering and performing treatments and interventions, ordering and review of laboratory studies, ordering and review of radiographic studies, pulse oximetry and re-evaluation of patient's condition.  ____________________________________________   INITIAL IMPRESSION / ASSESSMENT AND PLAN / ED COURSE  Pertinent labs & imaging results that were available during my care of the patient were reviewed by me and considered in my medical decision making (see chart for details).  I initially evaluated the patient broadly for the near syncope anticipating that possibly her infections were not adequately resolved.  However, her labs came back notable for an elevated troponin at 0.65.  This is in the setting of a completely negative troponin only about one week ago.  I am concerned that given that she has had multiple recent episodes of near-syncope and increasing shortness of breath with exertion this may represent either ACS or less likely a pulmonary embolism given her tachycardia at rest.  I will evaluate with a CT angiogram chest to rule out the pulmonary embolism but I will also start heparin for NSTEMI.  Giving full dose ASA.  Talked with family, explained need to stay in hospital.  ----------------------------------------- 2:48 PM on 04/20/2016 -----------------------------------------  Negative for pulmonary embolism.  Questionable thyroid normality; deferring ultrasound to hospitalist.  I updated the patient, discussed with the hospitalist and person.  Giving gentle fluids after CTA chest - CHF vest reading was 34 (normal).  I am not initiating additional antibiotics; I will defer to the hospitalist and she seems to be improving from her community-acquired pneumonia that was recently treated. ____________________________________________  FINAL CLINICAL IMPRESSION(S) / ED  DIAGNOSES  Final diagnoses:  NSTEMI (non-ST elevated myocardial infarction) ([place])  Near syncope  Shortness of breath  Thyroid nodule  Community acquired pneumonia     MEDICATIONS GIVEN DURING THIS VISIT:  Medications  sodium chloride 0.9 % bolus 500 mL (500 mLs Intravenous Rate/Dose Change 04/20/16 1442)  heparin ADULT infusion 100 units/mL (25000 units/274mL sodium chloride 0.45%) (750 Units/hr Intravenous New Bag/Given 04/20/16 1437)  aspirin chewable tablet 324 mg (324 mg Oral Given 04/20/16 1426)  heparin injection 3,300 Units (3,300 Units Intravenous Given 04/20/16 1432)  iopamidol (ISOVUE-370) 76 % injection 70 mL (70 mLs Intravenous Contrast Given 04/20/16 1356)     NEW OUTPATIENT MEDICATIONS STARTED DURING THIS VISIT:  New Prescriptions   No medications on file      Note:  This document was prepared using Dragon voice recognition software and may include unintentional dictation errors.   Hinda Kehr, MD 04/20/16 [phone]

## 2016-04-20 NOTE — Progress Notes (Signed)
ANTICOAGULATION CONSULT NOTE - Initial Consult  Pharmacy Consult for heparin drip dosing and monitoring Indication: chest pain/ACS  Allergies  Allergen Reactions  . Ciprofloxacin Anaphylaxis and Hives  . Ace Inhibitors Other (See Comments)    Reaction:  Unknown   . Amlodipine-Atorvastatin Other (See Comments)    Reaction:  Unknown   . Atorvastatin Other (See Comments)    Muscle cramps  . Amoxicillin Itching, Rash and Other (See Comments)    Has patient had a PCN reaction causing immediate rash, facial/tongue/throat swelling, SOB or lightheadedness with hypotension: No Has patient had a PCN reaction causing severe rash involving mucus membranes or skin necrosis: No Has patient had a PCN reaction that required hospitalization No Has patient had a PCN reaction occurring within the last 10 years: Yes If all of the above answers are "NO", then may proceed with Cephalosporin use.  . Quinolones Itching and Rash    Patient Measurements: Height: 5\' 2"  (157.5 cm) Weight: 146 lb (66.225 kg) IBW/kg (Calculated) : 50.1 Heparin Dosing Weight: 64 kg  Vital Signs: BP: 149/85 mmHg (06/04 1230) Pulse Rate: 100 (06/04 1230)  Labs:  Recent Labs  04/20/16 1219  HGB 10.3*  HCT 30.5*  PLT 354  APTT 24  LABPROT 14.6  INR 1.12  CREATININE 1.24*  TROPONINI 0.65*   Estimated Creatinine Clearance: 30.7 mL/min (by C-G formula based on Cr of 1.24).  Medical History: Past Medical History  Diagnosis Date  . Nonischemic cardiomyopathy ([place])     a. s/p Medtronic CRT-D, NYHA II-III at baseline; b. echo 2011: EF 43%, mildly dilated LA, PASP 44mm Hg; c. echo 3/16: EF 35-40%, GR1DD, mild to mod AI, mild MR, PASP 65 mmHg  . Coronary artery disease     a. s/p prior stent-RCA; b. cath 6/14: mild to mod prox to mid LAD dz, mod prox to dRCA at 60% unchanged from prior cath; c. nuc 3/16: no sig ischemia, nl WM, EF 19%  . History of MI (myocardial infarction)   . Dyslipidemia   . Hypertension   . COPD  (chronic obstructive pulmonary disease) ([place])   . Hypothyroidism   . Basal cell carcinoma 2011  . Respiratory infection   . Vitamin D deficiency   . Breast cancer ([place]) 30+ yrs. ago    right breast, radiation  . TIA (transient ischemic attack) 02/2016    a. negative head CT x 2, unable to have MRI brain given ICD; b. carotid doppler with 0-49% stenosis bilaterally with antegrade flow bilaterally   . Blind left eye   . Frequent falls   . LBBB (left bundle branch block)   . CHF (congestive heart failure) ([facility])    Medications:  Scheduled:  . aspirin  324 mg Oral Once  . heparin  50 Units/kg Intravenous Once    Assessment: Pharmacy consulted to dose and monitor heparin drip in this 82 year old female with suspected NSTEMI. Patient was not taking anticoagulants prior to admission.  Baseline labs: Hgb 10.3 Plt 354  INR 1.12 APTT 24  Goal of Therapy:  Heparin level 0.3-0.7 units/ml Monitor platelets by anticoagulation protocol: Yes   Plan:  Give 3300 units bolus x 1 (MD ordered bolus of 50 units/kg) Start heparin infusion at 750 units/hr (~12 units/kg/hr) Check anti-Xa level in 8 hours and daily while on heparin Continue to monitor H&H and platelets  Lenis Noon, PharmD Clinical Pharmacist 04/20/2016,2:03 PM

## 2016-04-20 NOTE — Progress Notes (Addendum)
LCSW met with patient and daughter. Assessment completed Fl2 and Passr, sent pt information out for LTC,/HTA medicaid pending. Family preference between [place] or [place] [facility]. Explained that another LCSW would follow them on the floor and that we would start their process.  [employer] LCSW [phone]

## 2016-04-21 ENCOUNTER — Encounter: Payer: Self-pay | Admitting: Physician Assistant

## 2016-04-21 DIAGNOSIS — I509 Heart failure, unspecified: Secondary | ICD-10-CM | POA: Diagnosis not present

## 2016-04-21 DIAGNOSIS — J189 Pneumonia, unspecified organism: Secondary | ICD-10-CM

## 2016-04-21 DIAGNOSIS — I214 Non-ST elevation (NSTEMI) myocardial infarction: Principal | ICD-10-CM

## 2016-04-21 DIAGNOSIS — R42 Dizziness and giddiness: Secondary | ICD-10-CM

## 2016-04-21 DIAGNOSIS — R55 Syncope and collapse: Secondary | ICD-10-CM | POA: Diagnosis not present

## 2016-04-21 DIAGNOSIS — I2510 Atherosclerotic heart disease of native coronary artery without angina pectoris: Secondary | ICD-10-CM | POA: Diagnosis not present

## 2016-04-21 DIAGNOSIS — E039 Hypothyroidism, unspecified: Secondary | ICD-10-CM | POA: Diagnosis not present

## 2016-04-21 LAB — BASIC METABOLIC PANEL
Anion gap: 6 (ref 5–15)
BUN: 16 mg/dL (ref 6–20)
CALCIUM: 8.5 mg/dL — AB (ref 8.9–10.3)
CO2: 27 mmol/L (ref 22–32)
CREATININE: 1.11 mg/dL — AB (ref 0.44–1.00)
Chloride: 104 mmol/L (ref 101–111)
GFR calc Af Amer: 52 mL/min — ABNORMAL LOW (ref >60)
GFR calc non Af Amer: 45 mL/min — ABNORMAL LOW (ref >60)
GLUCOSE: 92 mg/dL (ref 65–99)
Potassium: 4.5 mmol/L (ref 3.5–5.1)
Sodium: 137 mmol/L (ref 135–145)

## 2016-04-21 LAB — CBC
HCT: 28.6 % — ABNORMAL LOW (ref 35.0–47.0)
Hemoglobin: 9.7 g/dL — ABNORMAL LOW (ref 12.0–16.0)
MCH: 30.9 pg (ref 26.0–34.0)
MCHC: 34 g/dL (ref 32.0–36.0)
MCV: 90.8 fL (ref 80.0–100.0)
PLATELETS: 335 10*3/uL (ref 150–440)
RBC: 3.15 MIL/uL — ABNORMAL LOW (ref 3.80–5.20)
RDW: 15.1 % — ABNORMAL HIGH (ref 11.5–14.5)
WBC: 5.4 10*3/uL (ref 3.6–11.0)

## 2016-04-21 LAB — HEPARIN LEVEL (UNFRACTIONATED)
Heparin Unfractionated: 0.39 IU/mL (ref 0.30–0.70)
Heparin Unfractionated: 0.21 IU/mL — ABNORMAL LOW (ref 0.30–0.70)

## 2016-04-21 LAB — TROPONIN I
TROPONIN I: 1.21 ng/mL — AB (ref <0.031)
Troponin I: 0.85 ng/mL — ABNORMAL HIGH (ref <0.031)

## 2016-04-21 MED ORDER — MIDODRINE HCL 5 MG PO TABS
5.0000 mg | ORAL_TABLET | Freq: Three times a day (TID) | ORAL | Status: DC
  Administered 2016-04-21 – 2016-04-23 (×7): 5 mg via ORAL
  Filled 2016-04-21 (×11): qty 1

## 2016-04-21 MED ORDER — HEPARIN BOLUS VIA INFUSION
950.0000 [IU] | Freq: Once | INTRAVENOUS | Status: AC
  Administered 2016-04-21: 950 [IU] via INTRAVENOUS
  Filled 2016-04-21: qty 950

## 2016-04-21 NOTE — Progress Notes (Signed)
CSW spoke to patient's daughter [name]. She stated she meets with [facility] tomorrow. Stated she'll make a decision after she meets with [facility]. CSW explained to [name] that patient can go to SNF under her STR benefit and be transferred to LTC. Discussed co-pay options with [name] informing her that patient's co-pay can range from $150-200 per day. [name] stated she'd like to go to her meeting and discuss these options more in  depth tomorrow. Called Seth Bake- Admissions coordinator at [facility] she reported that patient's daughter stated she didn't want her to have therapy and that [facility] does not accept patient's insurance. Stated she has a meeting with [name] at [employer] and will know more of a decision tomorrow. CSW will continue to follow and assist.   Ernest Pine, MSW, [facility] Work Department [phone]

## 2016-04-21 NOTE — Progress Notes (Signed)
[place] [facility] [place] NAME: Erica [name]    MR#:  [id number]  DATE OF BIRTH:  01-Jun-1931  SUBJECTIVE:  CHIEF COMPLAINT:   Chief Complaint  Patient presents with  . Shortness of Breath   Feels well other than lightheadedness when she stands up. No syncope in the hospital. No chest pain. No shortness of breath.  REVIEW OF SYSTEMS:    Review of Systems  Constitutional: Positive for malaise/fatigue. Negative for fever and chills.  HENT: Negative for sore throat.   Eyes: Negative for blurred vision, double vision and pain.  Respiratory: Negative for cough, hemoptysis, shortness of breath and wheezing.   Cardiovascular: Negative for chest pain, palpitations, orthopnea and leg swelling.  Gastrointestinal: Negative for nausea, vomiting, abdominal pain, diarrhea and constipation.  Musculoskeletal: Negative for back pain and joint pain.  Skin: Negative for rash.  Neurological: Positive for dizziness. Negative for sensory change, speech change, focal weakness and headaches.    DRUG ALLERGIES:   Allergies  Allergen Reactions  . Ciprofloxacin Anaphylaxis and Hives  . Ace Inhibitors Other (See Comments)    Reaction:  Unknown   . Amlodipine-Atorvastatin Other (See Comments)    Reaction:  Unknown   . Atorvastatin Other (See Comments)    Muscle cramps  . Amoxicillin Itching, Rash and Other (See Comments)    Has patient had a PCN reaction causing immediate rash, facial/tongue/throat swelling, SOB or lightheadedness with hypotension: No Has patient had a PCN reaction causing severe rash involving mucus membranes or skin necrosis: No Has patient had a PCN reaction that required hospitalization No Has patient had a PCN reaction occurring within the last 10 years: Yes If all of the above answers are "NO", then may proceed with Cephalosporin use.  . Quinolones Itching and Rash    VITALS:  Blood pressure 126/57, pulse 79, temperature 97.4  F (36.3 C), temperature source Oral, resp. rate 18, height 5\' 2"  (1.575 m), weight 64.048 kg (141 lb 3.2 oz), SpO2 93 %.  PHYSICAL EXAMINATION:   Physical Exam  GENERAL:  82 y.o.-year-old patient lying in the bed with no acute distress.  EYES: Pupils equal, round, reactive to light and accommodation. No scleral icterus. Extraocular muscles intact.  HEENT: Head atraumatic, normocephalic. Oropharynx and nasopharynx clear.  NECK:  Supple, no jugular venous distention. No thyroid enlargement, no tenderness.  LUNGS: Normal breath sounds bilaterally, no wheezing, rales, rhonchi. No use of accessory muscles of respiration.  CARDIOVASCULAR: S1, S2 normal. No murmurs, rubs, or gallops.  ABDOMEN: Soft, nontender, nondistended. Bowel sounds present. No organomegaly or mass.  EXTREMITIES: No cyanosis, clubbing or edema b/l.    NEUROLOGIC: Cranial nerves II through XII are intact. No focal Motor or sensory deficits b/l.   PSYCHIATRIC: The patient is alert and oriented x 3.  SKIN: No obvious rash, lesion, or ulcer.   LABORATORY PANEL:   CBC  Recent Labs Lab 04/21/16 0651  WBC 5.4  HGB 9.7*  HCT 28.6*  PLT 335   ------------------------------------------------------------------------------------------------------------------ Chemistries   Recent Labs Lab 04/20/16 1219 04/21/16 0651  NA 136 137  K 4.5 4.5  CL 101 104  CO2 27 27  GLUCOSE 109* 92  BUN 15 16  CREATININE 1.24* 1.11*  CALCIUM 8.6* 8.5*  MG 2.1  --   AST 24  --   ALT 30  --   ALKPHOS 167*  --   BILITOT 0.3  --    ------------------------------------------------------------------------------------------------------------------  Cardiac Enzymes  Recent  Labs Lab 04/21/16 0651  TROPONINI 1.21*   ------------------------------------------------------------------------------------------------------------------  RADIOLOGY:  Dg Chest 2 View  04/20/2016  CLINICAL DATA:  Low oxygen saturation.  Shortness of breath.  EXAM: CHEST  2 VIEW COMPARISON:  04/15/2016 FINDINGS: Left chest wall ICD is noted with leads in the right atrial appendage, coronary sinus and right ventricle. Moderate cardiac enlargement. There is a small left pleural effusion. Mild diffuse interstitial edema is superimposed upon chronic lung disease. IMPRESSION: 1. Cardiac enlargement, aortic atherosclerosis and mild CHF 2. Chronic lung disease. Electronically Signed   By: Kerby Moors M.D.   On: 04/20/2016 11:46   Ct Angio Chest Pe W/cm &/or Wo Cm  04/20/2016  CLINICAL DATA:  Shortness of breath and tachycardia. History of breast carcinoma EXAM: CT ANGIOGRAPHY CHEST WITH CONTRAST TECHNIQUE: Multidetector CT imaging of the chest was performed using the standard protocol during bolus administration of intravenous contrast. Multiplanar CT image reconstructions and MIPs were obtained to evaluate the vascular anatomy. CONTRAST:  70 mL Isovue 370 nonionic COMPARISON:  Chest radiograph April 20, 2016 ; chest CT October 19, 2009 FINDINGS: Mediastinum/Lymph Nodes: There is no demonstrable pulmonary embolus. There is no thoracic aortic aneurysm or dissection. The visualize great vessels show moderate calcification at their respective origins. There is atherosclerotic calcification throughout the aorta. There is extensive coronary artery calcification. Pericardium is not appreciably thickened. Heart is mildly enlarged. Pacemaker leads are attached to the right atrium and right ventricle. The left lobe of the thyroid is diffusely enlarged and inhomogeneous in attenuation, a finding not present on prior study. There are scattered small mediastinal lymph nodes. There is no frank adenopathy on this study. Lungs/Pleura: There is extensive airspace consolidation in the posterior segment left upper lobe. A lesser degree of infiltrate is noted in the posterior segment of the right upper lobe. Several tiny interstitial fibrosis throughout the lungs bilaterally, primarily in the  lower lobes but also to some extent in the upper lobes, slightly more severe on the right than on the left. There is probable mild interstitial edema superimposed. There is a small left pleural effusion. Upper abdomen: Visualized upper abdominal structures appear unremarkable except for atherosclerotic calcification in the abdominal aorta. Musculoskeletal: There are no blastic or lytic bone lesions. Review of the MIP images confirms the above findings. IMPRESSION: No demonstrable pulmonary embolus. Airspace consolidation in the posterior segments of both upper lobes, more severe on the left than on the right. These areas rib consistent with pneumonia. Suspect mild interstitial edema. There is a small left effusion. There is a degree of underlying parenchymal fibrosis in both upper and lower lobes with relative lower lobe predominance. These changes progressed since the 2010 study. Cardiomegaly. Pacemaker leads attached to right atrium and right ventricle. The left lobe of the thyroid is diffusely enlarged and irregular and appearance. This finding represents a change compared to prior study. Nonemergent thyroid ultrasound is advised to assess for possible neoplasm arising from the left lobe of the thyroid. There are small lymph nodes present without frank adenopathy by size criteria. There is extensive atherosclerotic calcification. Extensive coronary artery calcification noted. Electronically Signed   By: Lowella Grip III M.D.   On: 04/20/2016 14:16     ASSESSMENT AND PLAN:   Charles Sharer is a 82 y.o. female with a known history of CAD status post stents, TIA/CVA in April 2017, combined ischemic cardiomyopathy with EF of 35% status post ICD and pacemaker, chronic interstitial lung disease, hypertension presents to the hospital from [facility] secondary to recurrent dizziness and syncopal episodes.  # NSTEMI -Cardiology consulted. No EKG changes and no chest pain. -IV heparin  started. Continue aspirin, Plavix and low-dose metoprolol, statin. - Await input regarding cardiac catheterization.  # Recovering pneumonia continue Keflex and azithromycin until 04/21/16  # Recurrent syncope, dizziness- had extensive workup done prior. MRI cannot be done due to her pacemaker. Carotid Dopplers did not show any atherosclerotic occlusive disease. Patient is on aspirin and Plavix. -Has a pacemaker. Beta blocker was stopped and she is supposed to follow-up with Dr. Virl Axe this month. - She does have chronic orthostatic hypotension. It is positive in the hospital. -Physical therapy consult. Daughter considering long-term care. -Social worker consulted  # Chronic Systolic CHF- Last EF of [id number]. -Continue oral Lasix  # Interstitial lung disease currently on 2 L oxygen As needed. Stable. Outpatient pulmonary follow-up. Continue home inhalers.  # DVT Prophylaxis On heparin drip  All the records are reviewed and case discussed with Care Management/Social Workerr. Management plans discussed with the patient, family and they are in agreement.  CODE STATUS: FULL CODE  DVT Prophylaxis: SCDs  TOTAL TIME TAKING CARE OF THIS PATIENT: 35 minutes.   POSSIBLE D/C IN 1-2 DAYS, DEPENDING ON CLINICAL CONDITION.  Hillary Bow R M.D on 04/21/2016 at 2:23 PM  Between 7am to 6pm - Pager - [phone]  After 6pm go to [email or web address] - password [email or web address] [facility]  Office  [phone]  CC: Primary care physician; Kirk Ruths., MD  Note: This dictation was prepared with Dragon dictation along with smaller phrase technology. Any transcriptional errors that result from this process are unintentional.

## 2016-04-21 NOTE — Progress Notes (Signed)
CSW met with patient at bedside. Per patient she is from [employer] but she has used her 20 days. Stated her daughter is speaking to SNFs ([facility]). Patient requested CSW call her daughter to get clarification and to determine her discharge plans. CSW called [name] and left a voicemail on her cell phone. Awaiting phone call back. CSW will continue to follow and assist.  Ernest Pine, MSW, [facility] Work Department [phone]

## 2016-04-21 NOTE — Progress Notes (Signed)
Patient remains alert and oriented, denies any pain , family at bedside, Heparin drip infusing at 51ml /HR. No active bleeding noted, assisted to [facility],  Will continue to monitor.

## 2016-04-21 NOTE — Care Management (Signed)
Readmission within 30 days.  Spoke with patient.  When asked patient if she presented from home, her response is very confusing.  Says she has been to [facility] but now she can not go back but does not know why.  Says she had therapy in her home but  patient does not answer question clearly- did she present from [employer] or home.  It appears that she presented from [facility] according to the discharge note on 5/30- she discharged to [facility] from [facility] and the ED note says she presented from [facility]. It is documented on previous admission that patient has used  her medicare days for current benefit period but again - it odes appear she discharged to [facility] on 5/30.  Was assessed by weekend csw and it is documented that daughter [name] is looking for long term placement.   Updated CSW.

## 2016-04-21 NOTE — Consult Note (Signed)
Cardiology Consultation Note  Patient ID: Jakerra Urrea, MRN: [id number], DOB/AGE: 1931-03-24 82 y.o. Admit date: 04/20/2016   Date of Consult: 04/21/2016 Primary Physician: Kirk Ruths., MD Primary Cardiologist: Dr. Rockey Situ, MD Requesting Physician: Dr. Tressia Miners, MD  Chief Complaint: Dizziness Reason for Consult: Elevated troponin/chronic dizziness   HPI: 82 y.o. female with h/o CAD s/p remote stenting to RCA, recent TIA/CVA at the end of April 2017, combined ICM/NICM s/p BiV ICD with generator change out on 04/05/2015 by Dr. Caryl Comes, MD (originally placed at [facility]), chronic combined CHF with NYHA class 2-3, LBBB, HTN, HLD, frequent falls, chronic dizziness, left-eye blindness, and chronic ILD who was recently admitted to [facility] in April for TIA and again in the end of May of pyelonephritis returned to [facility] on 6/4 with dizziness, pre-syncope, and syncope.   Last cardiac cath from 2014 showed mild to moderate proximal to mid LAD stenosis, moderate proximal and distal RCA disease estimated at 60% without change from prior cardiac cath in 2009, EF >55%. Medical management was advised. She last underwent ischemic evaluation via nuclear stress testing in 01/2015 that showed no significant ischemia or WMA, EF 19%. Follow up echo on 02/02/2015 showed an EF of 35-40%, GR1DD, mild to moderate AI, mild MR, PASP 65 mm hg. Prior echo from 2011 showed an EF of 43%, mildly dilated LA, mild pulmonary HTN at 33 mm Hg. She takes Lasix prn, though was initially started on this with dosing of every other day. She has had issues with chronic dizziness and increasing falls lately. They have appeared to be with standing, deconditioning, and anxiety. BP has been documented as stable during those times with a SBP not dropping lower than 110 mm Hg. She was seen in the ED in March for UTI and near syncope. Head CT was negative. She has scheduled neurology follow up. Admitted in late April for TIA symptoms. Head CT was negative x  2, she was unable to undergo MRI brain given her ICD. tPA was not given as her symptoms resolved by the time her presented. Troponin was found to be mildly elevated with a peak of 0.59, BNP 557. Echo was performed on 03/12/16 that showed EF 30-35%, moderate concentric hypertrophy, mild AI, moderate MR, mild biatrial enlargement, moderate TR. Bilateral carotid ultrasound was negative for occlusive PAD. She was seen by neurology, Plavix was added to her daily aspirin. She was found to be orthostatic and started on midodrine. She was admitted again in late May for LLL PNA and pyelonephritis, treated with ABX and discharged. She has since called the office with complaints of her chronic dizziness which has been unchanged from prior.    She was admitted on 6/4 with complaints of dizziness and reported pre-syncope/syncope. For the past couple of days prior to her admission when ambulating she would get dizzy. Per the patient she would become hypoxic with ambulation and was placed on O2 at [facility]. She does not know what her O2 sats were. She was not SOB. Given the persistent dizziness she was brought to [facility] for further evaluation. She was placed on midodrine in late April, but has not been taking this at home. Upon her arrival to [facility] she was found to have an elevated troponin at 1.02 on 6/3 though there are no notes from 6/3. Her troponin further trended to 0.65-->0.70-->1.21 at the time of cardiology consult. She was started on heparin gtt. Patient reports having a single episode of chest pain 2-3 days ago that lasted for  less than 5 minutes. Lipase negative. TSH normal, hgb 10.3-->9.7, WBC 9.9-->5.4, SCr 1.24-->1.11, K+ 4.5, Mg++ 2.1. CXR showed chronic lung disease, mild CHF. SHe had a CTA PE protocol that was negative for PE. There was evidence of PNA in the upper lobes of the lungs. Mild interstitial edema. She was also noted to have an abnormal left lobe of the thyroid with further work up advised. EKG  showed A sensed, V paced rhythm of 104 bpm. Orthostatic vitals were positive. Currently, asymptomatic.    Past Medical History  Diagnosis Date  . Nonischemic cardiomyopathy ([place])     a. s/p Medtronic CRT-D, NYHA II-III at baseline; b. echo 2011: EF 43%, mildly dilated LA, PASP 31mm Hg; c. echo 3/16: EF 35-40%, GR1DD, mild to mod AI, mild MR, PASP 65 mmHg  . Coronary artery disease     a. s/p prior stent-RCA; b. cath 6/14: mild to mod prox to mid LAD dz, mod prox to dRCA at 60% unchanged from prior cath; c. nuc 3/16: no sig ischemia, nl WM, EF 19%  . History of MI (myocardial infarction)   . Dyslipidemia   . Hypertension   . COPD (chronic obstructive pulmonary disease) ([place])   . Hypothyroidism   . Basal cell carcinoma 2011  . Respiratory infection   . Vitamin D deficiency   . Breast cancer ([place]) 30+ yrs. ago    right breast, radiation  . TIA (transient ischemic attack) 02/2016    a. negative head CT x 2, unable to have MRI brain given ICD; b. carotid doppler with 0-49% stenosis bilaterally with antegrade flow bilaterally   . Blind left eye   . Frequent falls   . LBBB (left bundle branch block)   . Orthostatic hypotension       Most Recent Cardiac Studies: As above   Surgical History:  Past Surgical History  Procedure Laterality Date  . Cardiac defibrillator placement    . Insert / replace / remove pacemaker    . Mastectomy    . Abdominal hysterectomy    . Basal cell carcinoma excision      Monh's procedure  . Cardiac catheterization  2014  . Ep implantable device N/A 04/05/2015    Procedure: BIV ICD Generator Changeout;  Surgeon: Deboraha Sprang, MD;  Location: [place] [facility];  Service: Cardiovascular;  Laterality: N/A;  . Breast excisional biopsy Right 30+yrs ago    positive     Home Meds: Prior to Admission medications   Medication Sig Start Date End Date Taking? Authorizing Provider  acetaminophen (TYLENOL) 325 MG tablet Take 325 mg by mouth every 6 (six) hours as  needed.    Historical Provider, MD  aspirin 81 MG chewable tablet Chew 1 tablet (81 mg total) by mouth daily. 03/14/16   Gladstone Lighter, MD  azithromycin (ZITHROMAX) 250 MG tablet Take 1 tablet (250 mg total) by mouth daily. 04/15/16   Srikar Sudini, MD  cephALEXin (KEFLEX) 500 MG capsule Take 1 capsule (500 mg total) by mouth 3 (three) times daily. 04/15/16   Srikar Sudini, MD  chlorpheniramine-HYDROcodone (TUSSIONEX PENNKINETIC ER) 10-8 MG/5ML SUER Take 5 mLs by mouth every 12 (twelve) hours as needed for cough. 04/15/16   Hillary Bow, MD  Cholecalciferol 2000 units CAPS Take 1 capsule by mouth daily.    Historical Provider, MD  clopidogrel (PLAVIX) 75 MG tablet Take 1 tablet (75 mg total) by mouth daily. 04/01/16   Rise Mu, PA-C  Fluticasone-Salmeterol (ADVAIR DISKUS) 250-50 MCG/DOSE AEPB  Inhale 1 puff into the lungs 2 (two) times daily. Patient taking differently: Inhale 1 puff into the lungs daily.  03/14/16   Gladstone Lighter, MD  furosemide (LASIX) 20 MG tablet Take 20 mg by mouth daily as needed for edema.    Historical Provider, MD  levothyroxine (SYNTHROID, LEVOTHROID) 75 MCG tablet Take 75 mcg by mouth daily before breakfast.     Historical Provider, MD  LORazepam (ATIVAN) 1 MG tablet Take 1 tablet (1 mg total) by mouth at bedtime as needed for anxiety or sleep. 04/15/16   Srikar Sudini, MD  Multiple Vitamins-Minerals (MULTIVITAMIN GUMMIES ADULT PO) Take 1 each by mouth daily at 12 noon.     Historical Provider, MD  pravastatin (PRAVACHOL) 10 MG tablet Take 10 mg by mouth at bedtime.     Historical Provider, MD    Inpatient Medications:  . aspirin  81 mg Oral Daily  . azithromycin  250 mg Oral Daily  . cephALEXin  500 mg Oral BID  . cholecalciferol  1,000 Units Oral Daily  . clopidogrel  75 mg Oral Daily  . docusate sodium  100 mg Oral BID  . levothyroxine  75 mcg Oral Q0600  . metoprolol tartrate  25 mg Oral BID  . mometasone-formoterol  2 puff Inhalation BID  . multivitamin  with minerals  1 tablet Oral Daily  . pravastatin  10 mg Oral QHS  . sodium chloride flush  3 mL Intravenous Q12H   . heparin 1,000 Units/hr (04/20/16 2348)    Allergies:  Allergies  Allergen Reactions  . Ciprofloxacin Anaphylaxis and Hives  . Ace Inhibitors Other (See Comments)    Reaction:  Unknown   . Amlodipine-Atorvastatin Other (See Comments)    Reaction:  Unknown   . Atorvastatin Other (See Comments)    Muscle cramps  . Amoxicillin Itching, Rash and Other (See Comments)    Has patient had a PCN reaction causing immediate rash, facial/tongue/throat swelling, SOB or lightheadedness with hypotension: No Has patient had a PCN reaction causing severe rash involving mucus membranes or skin necrosis: No Has patient had a PCN reaction that required hospitalization No Has patient had a PCN reaction occurring within the last 10 years: Yes If all of the above answers are "NO", then may proceed with Cephalosporin use.  . Quinolones Itching and Rash    Social History   Social History  . Marital Status: Widowed    Spouse Name: N/A  . Number of Children: N/A  . Years of Education: N/A   Occupational History  . Not on file.   Social History Main Topics  . Smoking status: Former Research scientist (life sciences)  . Smokeless tobacco: Never Used  . Alcohol Use: No  . Drug Use: No  . Sexual Activity: Not on file   Other Topics Concern  . Not on file   Social History Narrative     Family History  Problem Relation Age of Onset  . Cancer Other   . Coronary artery disease Other   . Hypertension Other   . Hypertension Mother   . Stroke Mother   . Hypertension Father   . Stroke Father      Review of Systems: Review of Systems  Constitutional: Positive for weight loss and malaise/fatigue. Negative for fever, chills and diaphoresis.  HENT: Negative for congestion.   Eyes: Negative for discharge and redness.  Respiratory: Positive for cough and sputum production. Negative for hemoptysis and  wheezing.   Cardiovascular: Positive for chest pain. Negative for palpitations,  orthopnea, claudication and leg swelling.  Gastrointestinal: Negative for heartburn, nausea, vomiting and abdominal pain.  Musculoskeletal: Positive for falls. Negative for myalgias.  Skin: Negative for rash.  Neurological: Positive for dizziness and weakness. Negative for tingling, tremors, sensory change, speech change, focal weakness and loss of consciousness.  Endo/Heme/Allergies: Does not bruise/bleed easily.  Psychiatric/Behavioral: Negative for substance abuse. The patient is nervous/anxious.   All other systems reviewed and are negative.   Labs:  Recent Labs  04/19/16 2241 04/20/16 1219 04/20/16 1822 04/21/16 0651  TROPONINI 1.02* 0.65* 0.70* 1.21*   Lab Results  Component Value Date   WBC 5.4 04/21/2016   HGB 9.7* 04/21/2016   HCT 28.6* 04/21/2016   MCV 90.8 04/21/2016   PLT 335 04/21/2016     Recent Labs Lab 04/20/16 1219 04/21/16 0651  NA 136 137  K 4.5 4.5  CL 101 104  CO2 27 27  BUN 15 16  CREATININE 1.24* 1.11*  CALCIUM 8.6* 8.5*  PROT 6.9  --   BILITOT 0.3  --   ALKPHOS 167*  --   ALT 30  --   AST 24  --   GLUCOSE 109* 92   Lab Results  Component Value Date   CHOL 149 03/13/2016   HDL 45 03/13/2016   LDLCALC 81 03/13/2016   TRIG 115 03/13/2016   No results found for: DDIMER  Radiology/Studies:  Dg Chest 2 View  04/20/2016  CLINICAL DATA:  Low oxygen saturation.  Shortness of breath. EXAM: CHEST  2 VIEW COMPARISON:  04/15/2016 FINDINGS: Left chest wall ICD is noted with leads in the right atrial appendage, coronary sinus and right ventricle. Moderate cardiac enlargement. There is a small left pleural effusion. Mild diffuse interstitial edema is superimposed upon chronic lung disease. IMPRESSION: 1. Cardiac enlargement, aortic atherosclerosis and mild CHF 2. Chronic lung disease. Electronically Signed   By: Kerby Moors M.D.   On: 04/20/2016 11:46   Dg Chest 2  View  04/15/2016  CLINICAL DATA:  82 year old with current history of COPD, coronary artery disease, CHF and personal history of right breast cancer, presenting with chronic cough. Former smoker. Followup left lower lobe pneumonia. EXAM: CHEST  2 VIEW COMPARISON:  04/10/2016 and earlier. FINDINGS: AP sitting erect and lateral images were obtained. Suboptimal inspiration. Patchy airspace consolidation in the left lower lobe, unchanged since the examination 5 days ago. Small left pleural effusion. Mild atelectasis in the right lower lobe and right middle lobe. No new pulmonary parenchymal abnormalities. Stable mild moderate cardiomegaly. Biventricular pacing defibrillator unchanged and appears intact. Right coronary artery stent. IMPRESSION: Stable left lower lobe pneumonia and associated small left pleural effusion. Mild atelectasis in the right middle lobe and right lower lobe related to suboptimal inspiration. Stable cardiomegaly without pulmonary edema. Electronically Signed   By: Evangeline Dakin M.D.   On: 04/15/2016 10:41   Ct Angio Chest Pe W/cm &/or Wo Cm  04/20/2016  CLINICAL DATA:  Shortness of breath and tachycardia. History of breast carcinoma EXAM: CT ANGIOGRAPHY CHEST WITH CONTRAST TECHNIQUE: Multidetector CT imaging of the chest was performed using the standard protocol during bolus administration of intravenous contrast. Multiplanar CT image reconstructions and MIPs were obtained to evaluate the vascular anatomy. CONTRAST:  70 mL Isovue 370 nonionic COMPARISON:  Chest radiograph April 20, 2016 ; chest CT October 19, 2009 FINDINGS: Mediastinum/Lymph Nodes: There is no demonstrable pulmonary embolus. There is no thoracic aortic aneurysm or dissection. The visualize great vessels show moderate calcification at their respective origins. There  is atherosclerotic calcification throughout the aorta. There is extensive coronary artery calcification. Pericardium is not appreciably thickened. Heart is mildly  enlarged. Pacemaker leads are attached to the right atrium and right ventricle. The left lobe of the thyroid is diffusely enlarged and inhomogeneous in attenuation, a finding not present on prior study. There are scattered small mediastinal lymph nodes. There is no frank adenopathy on this study. Lungs/Pleura: There is extensive airspace consolidation in the posterior segment left upper lobe. A lesser degree of infiltrate is noted in the posterior segment of the right upper lobe. Several tiny interstitial fibrosis throughout the lungs bilaterally, primarily in the lower lobes but also to some extent in the upper lobes, slightly more severe on the right than on the left. There is probable mild interstitial edema superimposed. There is a small left pleural effusion. Upper abdomen: Visualized upper abdominal structures appear unremarkable except for atherosclerotic calcification in the abdominal aorta. Musculoskeletal: There are no blastic or lytic bone lesions. Review of the MIP images confirms the above findings. IMPRESSION: No demonstrable pulmonary embolus. Airspace consolidation in the posterior segments of both upper lobes, more severe on the left than on the right. These areas rib consistent with pneumonia. Suspect mild interstitial edema. There is a small left effusion. There is a degree of underlying parenchymal fibrosis in both upper and lower lobes with relative lower lobe predominance. These changes progressed since the 2010 study. Cardiomegaly. Pacemaker leads attached to right atrium and right ventricle. The left lobe of the thyroid is diffusely enlarged and irregular and appearance. This finding represents a change compared to prior study. Nonemergent thyroid ultrasound is advised to assess for possible neoplasm arising from the left lobe of the thyroid. There are small lymph nodes present without frank adenopathy by size criteria. There is extensive atherosclerotic calcification. Extensive coronary  artery calcification noted. Electronically Signed   By: Lowella Grip III M.D.   On: 04/20/2016 14:16   Dg Chest Port 1 View  04/10/2016  CLINICAL DATA:  Acute onset of generalized weakness, chest pain, fever and dysuria. Initial encounter. EXAM: PORTABLE CHEST 1 VIEW COMPARISON:  Chest radiograph performed 03/12/2016 FINDINGS: The lungs are hypoexpanded. Patchy left-sided airspace opacity could reflect mild pneumonia. There is no evidence of pleural effusion or pneumothorax. The cardiomediastinal silhouette is borderline enlarged. A pacemaker/AICD is noted overlying the left chest wall, with leads ending overlying the right atrium, right ventricle and coronary sinus. No acute osseous abnormalities are seen. IMPRESSION: Lungs hypoexpanded. Patchy left-sided airspace opacity could reflect mild pneumonia, given the patient's symptoms. Borderline cardiomegaly. Electronically Signed   By: Garald Balding M.D.   On: 04/10/2016 19:35    EKG: Interpreted by me showed: A sensed, V paced rhythm of 104 bpm Telemetry: Interpreted by me showed: Paced, 70's  Weights: Filed Weights   04/20/16 1108 04/20/16 1715  Weight: 146 lb (66.225 kg) 141 lb 3.2 oz (64.048 kg)     Physical Exam: Blood pressure 126/57, pulse 79, temperature 97.4 F (36.3 C), temperature source Oral, resp. rate 18, height 5\' 2"  (1.575 m), weight 141 lb 3.2 oz (64.048 kg), SpO2 93 %. Body mass index is 25.82 kg/(m^2). General: Well developed, well nourished, in no acute distress. Head: Normocephalic, atraumatic, sclera non-icteric, no xanthomas, nares are without discharge.  Neck: Negative for carotid bruits. JVD not elevated. Lungs: Clear bilaterally to auscultation without wheezes, rales, or rhonchi. Breathing is unlabored. Heart: RRR with S1 S2. No murmurs, rubs, or gallops appreciated. Abdomen: Soft, non-tender, non-distended with normoactive  bowel sounds. No hepatomegaly. No rebound/guarding. No obvious abdominal masses. Msk:   Strength and tone appear normal for age. Extremities: No clubbing or cyanosis. No edema. Distal pedal pulses are 2+ and equal bilaterally. Neuro: Alert and oriented X 3. No facial asymmetry. No focal deficit. Moves all extremities spontaneously. Psych:  Responds to questions appropriately with a normal affect.    Assessment and Plan:  Principal Problem:   COPD (chronic obstructive pulmonary disease) (HCC) Active Problems:   Orthostatic hypotension   NSTEMI (non-ST elevated myocardial infarction) (HCC)   Frequent falls   PNA (pneumonia)   Dizziness    1. NSTEMI/CAD s/p prior stenting as above: -Had an episode of chest pain 2 days ago that lasted for less than 5 minutes, none since and no associated symptoms -On heparin gtt -Schedule cardiac catheterization, if MD agrees -Recent echo 02/2016 -Continue to trend troponin until trend down  2. Dizziness/pre-syncope/syncope/orthostatic hypotension: -Found to be orthostatic upon admission -Status post NS bolus -On midodrine as of April admission, though she was not taking this at home and this was not continued upon admission -Restart midodrine, titrate as needed -PT to see  3. Chronic combined CHF: -She does appear to be volume overloaded at this time -Continue Lasix -Not on BB upon admission, patient's choice   4. ILD: -Per IM  5. PNA: -On ABX per IM  6. CKD stage II-III: -Stable -Per IM  7. HLD: -Statin   Signed, Christell Faith, PA-C [facility] Pager: [phone] 04/21/2016, 11:31 AM

## 2016-04-21 NOTE — Progress Notes (Signed)
ANTICOAGULATION CONSULT NOTE - Follow Up Consult  Pharmacy Consult for heparin drip dosing and monitoring Indication: chest pain/ACS  Allergies  Allergen Reactions  . Ciprofloxacin Anaphylaxis and Hives  . Ace Inhibitors Other (See Comments)    Reaction:  Unknown   . Amlodipine-Atorvastatin Other (See Comments)    Reaction:  Unknown   . Atorvastatin Other (See Comments)    Muscle cramps  . Amoxicillin Itching, Rash and Other (See Comments)    Has patient had a PCN reaction causing immediate rash, facial/tongue/throat swelling, SOB or lightheadedness with hypotension: No Has patient had a PCN reaction causing severe rash involving mucus membranes or skin necrosis: No Has patient had a PCN reaction that required hospitalization No Has patient had a PCN reaction occurring within the last 10 years: Yes If all of the above answers are "NO", then may proceed with Cephalosporin use.  . Quinolones Itching and Rash    Patient Measurements: Height: 5\' 2"  (157.5 cm) (stated) Weight: 141 lb 3.2 oz (64.048 kg) (admission) IBW/kg (Calculated) : 50.1 Heparin Dosing Weight: 64 kg  Vital Signs: Temp: 97.4 F (36.3 C) (06/05 1107) Temp Source: Oral (06/05 1107) BP: 126/57 mmHg (06/05 1107) Pulse Rate: 79 (06/05 1107)  Labs:  Recent Labs  04/20/16 1219 04/20/16 1822 04/20/16 2241 04/21/16 0651 04/21/16 1542  HGB 10.3*  --   --  9.7*  --   HCT 30.5*  --   --  28.6*  --   PLT 354  --   --  335  --   APTT 24  --   --   --   --   LABPROT 14.6  --   --   --   --   INR 1.12  --   --   --   --   HEPARINUNFRC  --   --  <0.10* 0.39 0.21*  CREATININE 1.24*  --   --  1.11*  --   TROPONINI 0.65* 0.70*  --  1.21*  --    Estimated Creatinine Clearance: 33.8 mL/min (by C-G formula based on Cr of 1.11).  Medical History: Past Medical History  Diagnosis Date  . Nonischemic cardiomyopathy ([place])     a. s/p Medtronic CRT-D, NYHA II-III at baseline; b. echo 2011: EF 43%, mildly dilated LA, PASP  35mm Hg; c. echo 3/16: EF 35-40%, GR1DD, mild to mod AI, mild MR, PASP 65 mmHg  . Coronary artery disease     a. s/p prior stent-RCA; b. cath 6/14: mild to mod prox to mid LAD dz, mod prox to dRCA at 60% unchanged from prior cath; c. nuc 3/16: no sig ischemia, nl WM, EF 19%  . History of MI (myocardial infarction)   . Dyslipidemia   . Hypertension   . COPD (chronic obstructive pulmonary disease) ([place])   . Hypothyroidism   . Basal cell carcinoma 2011  . Respiratory infection   . Vitamin D deficiency   . Breast cancer ([place]) 30+ yrs. ago    right breast, radiation  . TIA (transient ischemic attack) 02/2016    a. negative head CT x 2, unable to have MRI brain given ICD; b. carotid doppler with 0-49% stenosis bilaterally with antegrade flow bilaterally   . Blind left eye   . Frequent falls   . LBBB (left bundle branch block)   . Orthostatic hypotension    Medications:  Scheduled:  . aspirin  81 mg Oral Daily  . azithromycin  250 mg Oral Daily  . cephALEXin  500 mg Oral BID  . cholecalciferol  1,000 Units Oral Daily  . clopidogrel  75 mg Oral Daily  . docusate sodium  100 mg Oral BID  . levothyroxine  75 mcg Oral Q0600  . midodrine  5 mg Oral TID WC  . mometasone-formoterol  2 puff Inhalation BID  . multivitamin with minerals  1 tablet Oral Daily  . pravastatin  10 mg Oral QHS  . sodium chloride flush  3 mL Intravenous Q12H    Assessment: Pharmacy consulted to dose and monitor heparin drip in this 82 year old female with suspected NSTEMI. Patient was not taking anticoagulants prior to admission.  Baseline labs: Hgb 10.3 Plt 354  INR 1.12 APTT 24  HL at 0651 of 0.39  Goal of Therapy:  Heparin level 0.3-0.7 units/ml Monitor platelets by anticoagulation protocol: Yes   Plan:  Current orders for heparin 1000 units/hr. Heparin level subtherapeutic, will give bolus of 950 units x 1 and increase rate to 1100 units/hr. Will recheck heparin level in 8 hours, CBC with AM  labs.  Pharmacy will continue to follow.   Cheri Guppy, PharmD Clinical Pharmacist 04/21/2016,4:45 PM

## 2016-04-21 NOTE — Progress Notes (Addendum)
PT Cancellation Note  Patient Details Name: Erica [name] MRN: [id number] DOB: 07-May-1931   Cancelled Treatment:    Reason Eval/Treat Not Completed: Other (comment). Consult received and chart reviewed. Pt with pending cardio consult secondary to increasing troponin and syncopal symptoms. Pt with positive orthostatics with standing 96/64. Will hold consult at this time until medically cleared.   Arisa Congleton 04/21/2016, 8:40 AM  Greggory Stallion, PT, DPT [phone]

## 2016-04-21 NOTE — Progress Notes (Signed)
ANTICOAGULATION CONSULT NOTE - Follow Up Consult  Pharmacy Consult for heparin drip dosing and monitoring Indication: chest pain/ACS  Allergies  Allergen Reactions  . Ciprofloxacin Anaphylaxis and Hives  . Ace Inhibitors Other (See Comments)    Reaction:  Unknown   . Amlodipine-Atorvastatin Other (See Comments)    Reaction:  Unknown   . Atorvastatin Other (See Comments)    Muscle cramps  . Amoxicillin Itching, Rash and Other (See Comments)    Has patient had a PCN reaction causing immediate rash, facial/tongue/throat swelling, SOB or lightheadedness with hypotension: No Has patient had a PCN reaction causing severe rash involving mucus membranes or skin necrosis: No Has patient had a PCN reaction that required hospitalization No Has patient had a PCN reaction occurring within the last 10 years: Yes If all of the above answers are "NO", then may proceed with Cephalosporin use.  . Quinolones Itching and Rash    Patient Measurements: Height: 5\' 2"  (157.5 cm) (stated) Weight: 141 lb 3.2 oz (64.048 kg) (admission) IBW/kg (Calculated) : 50.1 Heparin Dosing Weight: 64 kg  Vital Signs: Temp: 97.4 F (36.3 C) (06/05 1107) Temp Source: Oral (06/05 1107) BP: 126/57 mmHg (06/05 1107) Pulse Rate: 79 (06/05 1107)  Labs:  Recent Labs  04/20/16 1219 04/20/16 1822 04/20/16 2241 04/21/16 0651  HGB 10.3*  --   --  9.7*  HCT 30.5*  --   --  28.6*  PLT 354  --   --  335  APTT 24  --   --   --   LABPROT 14.6  --   --   --   INR 1.12  --   --   --   HEPARINUNFRC  --   --  <0.10* 0.39  CREATININE 1.24*  --   --  1.11*  TROPONINI 0.65* 0.70*  --  1.21*   Estimated Creatinine Clearance: 33.8 mL/min (by C-G formula based on Cr of 1.11).  Medical History: Past Medical History  Diagnosis Date  . Nonischemic cardiomyopathy ([place])     a. s/p Medtronic CRT-D, NYHA II-III at baseline; b. echo 2011: EF 43%, mildly dilated LA, PASP 78mm Hg; c. echo 3/16: EF 35-40%, GR1DD, mild to mod AI, mild  MR, PASP 65 mmHg  . Coronary artery disease     a. s/p prior stent-RCA; b. cath 6/14: mild to mod prox to mid LAD dz, mod prox to dRCA at 60% unchanged from prior cath; c. nuc 3/16: no sig ischemia, nl WM, EF 19%  . History of MI (myocardial infarction)   . Dyslipidemia   . Hypertension   . COPD (chronic obstructive pulmonary disease) ([place])   . Hypothyroidism   . Basal cell carcinoma 2011  . Respiratory infection   . Vitamin D deficiency   . Breast cancer ([place]) 30+ yrs. ago    right breast, radiation  . TIA (transient ischemic attack) 02/2016    a. negative head CT x 2, unable to have MRI brain given ICD; b. carotid doppler with 0-49% stenosis bilaterally with antegrade flow bilaterally   . Blind left eye   . Frequent falls   . LBBB (left bundle branch block)   . Orthostatic hypotension    Medications:  Scheduled:  . aspirin  81 mg Oral Daily  . azithromycin  250 mg Oral Daily  . cephALEXin  500 mg Oral BID  . cholecalciferol  1,000 Units Oral Daily  . clopidogrel  75 mg Oral Daily  . docusate sodium  100 mg  Oral BID  . levothyroxine  75 mcg Oral Q0600  . midodrine  5 mg Oral TID WC  . mometasone-formoterol  2 puff Inhalation BID  . multivitamin with minerals  1 tablet Oral Daily  . pravastatin  10 mg Oral QHS  . sodium chloride flush  3 mL Intravenous Q12H    Assessment: Pharmacy consulted to dose and monitor heparin drip in this 82 year old female with suspected NSTEMI. Patient was not taking anticoagulants prior to admission.  Baseline labs: Hgb 10.3 Plt 354  INR 1.12 APTT 24  HL at 0651 of 0.39  Goal of Therapy:  Heparin level 0.3-0.7 units/ml Monitor platelets by anticoagulation protocol: Yes   Plan:  Continue current rate of 1000 units/hr. Will recheck HL in 8 hours at 1530 for confirmation.  Will order CBC in AM.    Pharmacy will continue to follow.   Loleta Dicker, PharmD Clinical Pharmacist 04/21/2016,3:47 PM

## 2016-04-21 NOTE — Care Management Important Message (Signed)
Important Message  Patient Details  Name: Erica [name] MRN: [id number] Date of Birth: 04/29/1931   Medicare Important Message Given:  Yes    Katrina Stack, RN 04/21/2016, 2:29 PM

## 2016-04-22 DIAGNOSIS — I5022 Chronic systolic (congestive) heart failure: Secondary | ICD-10-CM | POA: Insufficient documentation

## 2016-04-22 DIAGNOSIS — E039 Hypothyroidism, unspecified: Secondary | ICD-10-CM | POA: Diagnosis not present

## 2016-04-22 DIAGNOSIS — I25810 Atherosclerosis of coronary artery bypass graft(s) without angina pectoris: Secondary | ICD-10-CM | POA: Diagnosis not present

## 2016-04-22 DIAGNOSIS — I214 Non-ST elevation (NSTEMI) myocardial infarction: Secondary | ICD-10-CM | POA: Diagnosis not present

## 2016-04-22 DIAGNOSIS — R7989 Other specified abnormal findings of blood chemistry: Secondary | ICD-10-CM | POA: Diagnosis not present

## 2016-04-22 DIAGNOSIS — I951 Orthostatic hypotension: Secondary | ICD-10-CM | POA: Diagnosis not present

## 2016-04-22 DIAGNOSIS — R55 Syncope and collapse: Secondary | ICD-10-CM | POA: Diagnosis not present

## 2016-04-22 DIAGNOSIS — I509 Heart failure, unspecified: Secondary | ICD-10-CM | POA: Diagnosis not present

## 2016-04-22 LAB — CBC
HCT: 27.9 % — ABNORMAL LOW (ref 35.0–47.0)
HEMOGLOBIN: 9.5 g/dL — AB (ref 12.0–16.0)
MCH: 30.4 pg (ref 26.0–34.0)
MCHC: 34.1 g/dL (ref 32.0–36.0)
MCV: 89.1 fL (ref 80.0–100.0)
PLATELETS: 347 10*3/uL (ref 150–440)
RBC: 3.13 MIL/uL — AB (ref 3.80–5.20)
RDW: 14.8 % — ABNORMAL HIGH (ref 11.5–14.5)
WBC: 6.7 10*3/uL (ref 3.6–11.0)

## 2016-04-22 LAB — HEPARIN LEVEL (UNFRACTIONATED)
Heparin Unfractionated: 0.3 IU/mL (ref 0.30–0.70)
Heparin Unfractionated: <0.1 IU/mL — ABNORMAL LOW (ref 0.30–0.70)
Heparin Unfractionated: 0.28 IU/mL — ABNORMAL LOW (ref 0.30–0.70)

## 2016-04-22 MED ORDER — HEPARIN (PORCINE) IN NACL 100-0.45 UNIT/ML-% IJ SOLN
1200.0000 [IU]/h | INTRAMUSCULAR | Status: DC
  Administered 2016-04-22: 1200 [IU]/h via INTRAVENOUS
  Filled 2016-04-22 (×2): qty 250

## 2016-04-22 MED ORDER — HEPARIN BOLUS VIA INFUSION
900.0000 [IU] | Freq: Once | INTRAVENOUS | Status: AC
  Administered 2016-04-22: 900 [IU] via INTRAVENOUS
  Filled 2016-04-22: qty 900

## 2016-04-22 MED ORDER — FUROSEMIDE 20 MG PO TABS
20.0000 mg | ORAL_TABLET | Freq: Every day | ORAL | Status: DC
  Administered 2016-04-22 – 2016-04-23 (×2): 20 mg via ORAL
  Filled 2016-04-22 (×4): qty 1

## 2016-04-22 NOTE — Progress Notes (Signed)
ANTICOAGULATION CONSULT NOTE - Follow Up Consult  Pharmacy Consult for heparin drip dosing and monitoring Indication: chest pain/ACS  Allergies  Allergen Reactions  . Ciprofloxacin Anaphylaxis and Hives  . Ace Inhibitors Other (See Comments)    Reaction:  Unknown   . Amlodipine-Atorvastatin Other (See Comments)    Reaction:  Unknown   . Atorvastatin Other (See Comments)    Muscle cramps  . Amoxicillin Itching, Rash and Other (See Comments)    Has patient had a PCN reaction causing immediate rash, facial/tongue/throat swelling, SOB or lightheadedness with hypotension: No Has patient had a PCN reaction causing severe rash involving mucus membranes or skin necrosis: No Has patient had a PCN reaction that required hospitalization No Has patient had a PCN reaction occurring within the last 10 years: Yes If all of the above answers are "NO", then may proceed with Cephalosporin use.  . Quinolones Itching and Rash    Patient Measurements: Height: 5\' 2"  (157.5 cm) (stated) Weight: 141 lb 3.2 oz (64.048 kg) (admission) IBW/kg (Calculated) : 50.1 Heparin Dosing Weight: 64 kg  Vital Signs: Temp: 98 F (36.7 C) (06/05 2001) Temp Source: Oral (06/05 2001) BP: 135/57 mmHg (06/05 2006) Pulse Rate: 81 (06/05 2006)  Labs:  Recent Labs  04/20/16 1219 04/20/16 1822  04/21/16 0651 04/21/16 1542 04/21/16 1844 04/22/16 0049  HGB 10.3*  --   --  9.7*  --   --  9.5*  HCT 30.5*  --   --  28.6*  --   --  27.9*  PLT 354  --   --  335  --   --  347  APTT 24  --   --   --   --   --   --   LABPROT 14.6  --   --   --   --   --   --   INR 1.12  --   --   --   --   --   --   HEPARINUNFRC  --   --   < > 0.39 0.21*  --  0.30  CREATININE 1.24*  --   --  1.11*  --   --   --   TROPONINI 0.65* 0.70*  --  1.21*  --  0.85*  --   < > = values in this interval not displayed. Estimated Creatinine Clearance: 33.8 mL/min (by C-G formula based on Cr of 1.11).  Medical History: Past Medical History   Diagnosis Date  . Nonischemic cardiomyopathy ([place])     a. s/p Medtronic CRT-D, NYHA II-III at baseline; b. echo 2011: EF 43%, mildly dilated LA, PASP 27mm Hg; c. echo 3/16: EF 35-40%, GR1DD, mild to mod AI, mild MR, PASP 65 mmHg  . Coronary artery disease     a. s/p prior stent-RCA; b. cath 6/14: mild to mod prox to mid LAD dz, mod prox to dRCA at 60% unchanged from prior cath; c. nuc 3/16: no sig ischemia, nl WM, EF 19%  . History of MI (myocardial infarction)   . Dyslipidemia   . Hypertension   . COPD (chronic obstructive pulmonary disease) ([place])   . Hypothyroidism   . Basal cell carcinoma 2011  . Respiratory infection   . Vitamin D deficiency   . Breast cancer ([place]) 30+ yrs. ago    right breast, radiation  . TIA (transient ischemic attack) 02/2016    a. negative head CT x 2, unable to have MRI brain given ICD; b. carotid doppler with  0-49% stenosis bilaterally with antegrade flow bilaterally   . Blind left eye   . Frequent falls   . LBBB (left bundle branch block)   . Orthostatic hypotension    Medications:  Scheduled:  . aspirin  81 mg Oral Daily  . azithromycin  250 mg Oral Daily  . cephALEXin  500 mg Oral BID  . cholecalciferol  1,000 Units Oral Daily  . clopidogrel  75 mg Oral Daily  . docusate sodium  100 mg Oral BID  . levothyroxine  75 mcg Oral Q0600  . midodrine  5 mg Oral TID WC  . mometasone-formoterol  2 puff Inhalation BID  . multivitamin with minerals  1 tablet Oral Daily  . pravastatin  10 mg Oral QHS  . sodium chloride flush  3 mL Intravenous Q12H    Assessment: Pharmacy consulted to dose and monitor heparin drip in this 82 year old female with suspected NSTEMI. Patient was not taking anticoagulants prior to admission.  Baseline labs: Hgb 10.3 Plt 354  INR 1.12 APTT 24  HL at 0651 of 0.39  Goal of Therapy:  Heparin level 0.3-0.7 units/ml Monitor platelets by anticoagulation protocol: Yes   Plan:  Heparin level therapeutic x 1. Continue current  rate and recheck level in 8 hours.  Pharmacy will continue to follow.   Laural Benes, Pharm.D., BCPS Clinical Pharmacist 04/22/2016,3:33 AM

## 2016-04-22 NOTE — Progress Notes (Addendum)
[place] [facility] [place] NAME: Erica [name]    MR#:  [id number]  DATE OF BIRTH:  [birth year]  SUBJECTIVE:  CHIEF COMPLAINT:   Chief Complaint  Patient presents with  . Shortness of Breath   -Started on midodrine, dizziness has much improved. Continues to feel dyspneic minimal exertion. -Continue heparin drip until tomorrow and Myoview scheduled for tomorrow. -Troponin elevation remained stable.  REVIEW OF SYSTEMS:    Review of Systems  Constitutional: Positive for malaise/fatigue. Negative for fever and chills.  HENT: Negative for sore throat.   Eyes: Negative for blurred vision, double vision and pain.  Respiratory: Positive for shortness of breath. Negative for cough, hemoptysis and wheezing.   Cardiovascular: Negative for chest pain, palpitations, orthopnea and leg swelling.  Gastrointestinal: Negative for nausea, vomiting, abdominal pain, diarrhea and constipation.  Musculoskeletal: Negative for back pain and joint pain.  Skin: Negative for rash.  Neurological: Positive for dizziness. Negative for sensory change, speech change, focal weakness and headaches.    DRUG ALLERGIES:   Allergies  Allergen Reactions  . Ciprofloxacin Anaphylaxis and Hives  . Ace Inhibitors Other (See Comments)    Reaction:  Unknown   . Amlodipine-Atorvastatin Other (See Comments)    Reaction:  Unknown   . Atorvastatin Other (See Comments)    Muscle cramps  . Amoxicillin Itching, Rash and Other (See Comments)    Has patient had a PCN reaction causing immediate rash, facial/tongue/throat swelling, SOB or lightheadedness with hypotension: No Has patient had a PCN reaction causing severe rash involving mucus membranes or skin necrosis: No Has patient had a PCN reaction that required hospitalization No Has patient had a PCN reaction occurring within the last 10 years: Yes If all of the above answers are "NO", then may proceed with Cephalosporin use.   . Quinolones Itching and Rash    VITALS:  Blood pressure 121/73, pulse 86, temperature 97.9 F (36.6 C), temperature source Oral, resp. rate 18, height 5\' 2"  (1.575 m), weight 64.048 kg (141 lb 3.2 oz), SpO2 90 %.  PHYSICAL EXAMINATION:   Physical Exam  GENERAL:  82 y.o.-year-old patient lying in the bed with no acute distress.  EYES: Pupils equal, round, reactive to light and accommodation. No scleral icterus. Extraocular muscles intact.  HEENT: Head atraumatic, normocephalic. Oropharynx and nasopharynx clear.  NECK:  Supple, no jugular venous distention. No thyroid enlargement, no tenderness.  LUNGS: Normal breath sounds bilaterally, no wheezing,rhonchi. No use of accessory muscles of respiration. Fine bibasilar crackles heard. CARDIOVASCULAR: S1, S2 normal. No rubs, or gallops. 2/6 systolic murmur is present ABDOMEN: Soft, nontender, nondistended. Bowel sounds present. No organomegaly or mass.  EXTREMITIES: No cyanosis, clubbing or edema b/l.    NEUROLOGIC: Cranial nerves II through XII are intact. No focal Motor or sensory deficits b/l.   PSYCHIATRIC: The patient is alert and oriented x 3.  SKIN: No obvious rash, lesion, or ulcer.   LABORATORY PANEL:   CBC  Recent Labs Lab 04/22/16 0049  WBC 6.7  HGB 9.5*  HCT 27.9*  PLT 347   ------------------------------------------------------------------------------------------------------------------ Chemistries   Recent Labs Lab 04/20/16 1219 04/21/16 0651  NA 136 137  K 4.5 4.5  CL 101 104  CO2 27 27  GLUCOSE 109* 92  BUN 15 16  CREATININE 1.24* 1.11*  CALCIUM 8.6* 8.5*  MG 2.1  --   AST 24  --   ALT 30  --   ALKPHOS 167*  --   BILITOT  0.3  --    ------------------------------------------------------------------------------------------------------------------  Cardiac Enzymes  Recent Labs Lab 04/21/16 1844  TROPONINI 0.85*    ------------------------------------------------------------------------------------------------------------------  RADIOLOGY:  Ct Angio Chest Pe W/cm &/or Wo Cm  04/20/2016  CLINICAL DATA:  Shortness of breath and tachycardia. History of breast carcinoma EXAM: CT ANGIOGRAPHY CHEST WITH CONTRAST TECHNIQUE: Multidetector CT imaging of the chest was performed using the standard protocol during bolus administration of intravenous contrast. Multiplanar CT image reconstructions and MIPs were obtained to evaluate the vascular anatomy. CONTRAST:  70 mL Isovue 370 nonionic COMPARISON:  Chest radiograph April 20, 2016 ; chest CT October 19, 2009 FINDINGS: Mediastinum/Lymph Nodes: There is no demonstrable pulmonary embolus. There is no thoracic aortic aneurysm or dissection. The visualize great vessels show moderate calcification at their respective origins. There is atherosclerotic calcification throughout the aorta. There is extensive coronary artery calcification. Pericardium is not appreciably thickened. Heart is mildly enlarged. Pacemaker leads are attached to the right atrium and right ventricle. The left lobe of the thyroid is diffusely enlarged and inhomogeneous in attenuation, a finding not present on prior study. There are scattered small mediastinal lymph nodes. There is no frank adenopathy on this study. Lungs/Pleura: There is extensive airspace consolidation in the posterior segment left upper lobe. A lesser degree of infiltrate is noted in the posterior segment of the right upper lobe. Several tiny interstitial fibrosis throughout the lungs bilaterally, primarily in the lower lobes but also to some extent in the upper lobes, slightly more severe on the right than on the left. There is probable mild interstitial edema superimposed. There is a small left pleural effusion. Upper abdomen: Visualized upper abdominal structures appear unremarkable except for atherosclerotic calcification in the abdominal aorta.  Musculoskeletal: There are no blastic or lytic bone lesions. Review of the MIP images confirms the above findings. IMPRESSION: No demonstrable pulmonary embolus. Airspace consolidation in the posterior segments of both upper lobes, more severe on the left than on the right. These areas rib consistent with pneumonia. Suspect mild interstitial edema. There is a small left effusion. There is a degree of underlying parenchymal fibrosis in both upper and lower lobes with relative lower lobe predominance. These changes progressed since the 2010 study. Cardiomegaly. Pacemaker leads attached to right atrium and right ventricle. The left lobe of the thyroid is diffusely enlarged and irregular and appearance. This finding represents a change compared to prior study. Nonemergent thyroid ultrasound is advised to assess for possible neoplasm arising from the left lobe of the thyroid. There are small lymph nodes present without frank adenopathy by size criteria. There is extensive atherosclerotic calcification. Extensive coronary artery calcification noted. Electronically Signed   By: Bretta Bang III M.D.   On: 04/20/2016 14:16     ASSESSMENT AND PLAN:   Enes Shupe is a 82 y.o. female with a known history of CAD status post stents, TIA/CVA in April 2017, combined ischemic cardiomyopathy with EF of 35% status post ICD and pacemaker, chronic interstitial lung disease, hypertension presents to the hospital from [facility] secondary to recurrent dizziness and syncopal episodes.  # NSTEMI -Cardiology consulted. No EKG changes and no chest pain.Could be demand ischemia but cannot rule out acute coronary syndrome -Patient not interested in invasive procedure like cardiac catheterization -Continue IV heparin until tomorrow. Continue aspirin, Plavix and low-dose metoprolol, statin. - Myoview tomorrow -Appreciate cardiology consult  # Recovering pneumonia continue Keflex and azithromycin until  Today   # Recurrent syncope, dizziness- had extensive workup done prior. MRI  cannot be done due to her pacemaker. Carotid Dopplers did not show any atherosclerotic occlusive disease. Patient is on aspirin and Plavix. -Has a pacemaker.  - She does have chronic orthostatic hypotension which is improved since starting midodrine. -Physical therapy consulted. Daughter considering long-term care. -Social worker consulted  # Chronic Systolic CHF- Last EF of [id number]. -Patient has been on when necessary Lasix, changed to daily today due to her dyspnea and fine crackles on exam.  # Interstitial lung disease currently on 2 L oxygen As needed. Stable. Outpatient pulmonary follow-up. Continue home inhalers.  # DVT Prophylaxis On heparin drip  Possible discharge tomorrow after Myoview is negative  All the records are reviewed and case discussed with Care Management/Social Workerr. Management plans discussed with the patient, family and they are in agreement.  CODE STATUS: FULL CODE  DVT Prophylaxis: SCDs  TOTAL TIME TAKING CARE OF THIS PATIENT: 35 minutes.   POSSIBLE D/C  tomorrow, DEPENDING ON CLINICAL CONDITION.  Gladstone Lighter M.D on 04/22/2016 at 11:42 AM  Between 7am to 6pm - Pager - [phone]  After 6pm go to [email or web address] - password [email or web address] [facility]  Office  [phone]  CC: Primary care physician; Kirk Ruths., MD  Note: This dictation was prepared with Dragon dictation along with smaller phrase technology. Any transcriptional errors that result from this process are unintentional.

## 2016-04-22 NOTE — Progress Notes (Signed)
Patient: Erica [name] / Admit Date: 04/20/2016 / Date of Encounter: 04/22/2016, 11:22 AM   Subjective: No acute overnight events. Up sitting in her chair. SOB resolved. No chest pain. Planning for [employer] on 04/23/16. On heparin for medical management of elevated troponin of uncertain significance at this time. Troponin peaked at 1.2, then downward trend. BP stable. Asymptomatic.   Review of Systems: Review of Systems  Constitutional: Positive for malaise/fatigue. Negative for fever, chills, weight loss and diaphoresis.  HENT: Negative for congestion.   Eyes: Negative for discharge and redness.  Respiratory: Positive for cough. Negative for hemoptysis, sputum production, shortness of breath and wheezing.   Cardiovascular: Negative for chest pain, palpitations, orthopnea, claudication, leg swelling and PND.  Gastrointestinal: Negative for nausea and vomiting.  Musculoskeletal: Negative for falls.  Skin: Negative for rash.  Neurological: Negative for dizziness, sensory change, speech change, focal weakness, loss of consciousness and weakness.  Endo/Heme/Allergies: Does not bruise/bleed easily.  Psychiatric/Behavioral: The patient is not nervous/anxious.     Objective: Telemetry: intermittent V pacing, 80's bpm Physical Exam: Blood pressure 121/73, pulse 86, temperature 97.9 F (36.6 C), temperature source Oral, resp. rate 18, height 5\' 2"  (1.575 m), weight 141 lb 3.2 oz (64.048 kg), SpO2 90 %. Body mass index is 25.82 kg/(m^2). General: Well developed, well nourished, in no acute distress. Head: Normocephalic, atraumatic, sclera non-icteric, no xanthomas, nares are without discharge. Neck: Negative for carotid bruits. JVP not elevated. Lungs: Clear bilaterally to auscultation without wheezes, rales, or rhonchi. Breathing is unlabored. Heart: RRR S1 S2 without murmurs, rubs, or gallops.  Abdomen: Soft, non-tender, non-distended with normoactive bowel sounds. No  rebound/guarding. Extremities: No clubbing or cyanosis. No edema. Distal pedal pulses are 2+ and equal bilaterally. Neuro: Alert and oriented X 3. Moves all extremities spontaneously. Psych:  Responds to questions appropriately with a normal affect.   Intake/Output Summary (Last 24 hours) at 04/22/16 1122 Last data filed at 04/22/16 1012  Gross per 24 hour  Intake    600 ml  Output    200 ml  Net    400 ml    Inpatient Medications:  . aspirin  81 mg Oral Daily  . azithromycin  250 mg Oral Daily  . cephALEXin  500 mg Oral BID  . cholecalciferol  1,000 Units Oral Daily  . clopidogrel  75 mg Oral Daily  . docusate sodium  100 mg Oral BID  . levothyroxine  75 mcg Oral Q0600  . midodrine  5 mg Oral TID WC  . mometasone-formoterol  2 puff Inhalation BID  . multivitamin with minerals  1 tablet Oral Daily  . pravastatin  10 mg Oral QHS  . sodium chloride flush  3 mL Intravenous Q12H   Infusions:  . heparin 1,100 Units/hr (04/21/16 1706)    Labs:  Recent Labs  04/20/16 1219 04/21/16 0651  NA 136 137  K 4.5 4.5  CL 101 104  CO2 27 27  GLUCOSE 109* 92  BUN 15 16  CREATININE 1.24* 1.11*  CALCIUM 8.6* 8.5*  MG 2.1  --     Recent Labs  04/20/16 1219  AST 24  ALT 30  ALKPHOS 167*  BILITOT 0.3  PROT 6.9  ALBUMIN 2.6*    Recent Labs  04/20/16 1219 04/21/16 0651 04/22/16 0049  WBC 9.9 5.4 6.7  NEUTROABS 8.6*  --   --   HGB 10.3* 9.7* 9.5*  HCT 30.5* 28.6* 27.9*  MCV 90.2 90.8 89.1  PLT 354 335  [place]  04/20/16 1219 04/20/16 1822 04/21/16 0651 04/21/16 1844  TROPONINI 0.65* 0.70* 1.21* 0.85*   Invalid input(s): POCBNP No results for input(s): HGBA1C in the last 72 hours.   Weights: Filed Weights   04/20/16 1108 04/20/16 1715  Weight: 146 lb (66.225 kg) 141 lb 3.2 oz (64.048 kg)     Radiology/Studies:  Dg Chest 2 View  04/20/2016  CLINICAL DATA:  Low oxygen saturation.  Shortness of breath. EXAM: CHEST  2 VIEW COMPARISON:  04/15/2016  FINDINGS: Left chest wall ICD is noted with leads in the right atrial appendage, coronary sinus and right ventricle. Moderate cardiac enlargement. There is a small left pleural effusion. Mild diffuse interstitial edema is superimposed upon chronic lung disease. IMPRESSION: 1. Cardiac enlargement, aortic atherosclerosis and mild CHF 2. Chronic lung disease. Electronically Signed   By: Kerby Moors M.D.   On: 04/20/2016 11:46   Dg Chest 2 View  04/15/2016  CLINICAL DATA:  82 year old with current history of COPD, coronary artery disease, CHF and personal history of right breast cancer, presenting with chronic cough. Former smoker. Followup left lower lobe pneumonia. EXAM: CHEST  2 VIEW COMPARISON:  04/10/2016 and earlier. FINDINGS: AP sitting erect and lateral images were obtained. Suboptimal inspiration. Patchy airspace consolidation in the left lower lobe, unchanged since the examination 5 days ago. Small left pleural effusion. Mild atelectasis in the right lower lobe and right middle lobe. No new pulmonary parenchymal abnormalities. Stable mild moderate cardiomegaly. Biventricular pacing defibrillator unchanged and appears intact. Right coronary artery stent. IMPRESSION: Stable left lower lobe pneumonia and associated small left pleural effusion. Mild atelectasis in the right middle lobe and right lower lobe related to suboptimal inspiration. Stable cardiomegaly without pulmonary edema. Electronically Signed   By: Evangeline Dakin M.D.   On: 04/15/2016 10:41   Ct Angio Chest Pe W/cm &/or Wo Cm  04/20/2016  CLINICAL DATA:  Shortness of breath and tachycardia. History of breast carcinoma EXAM: CT ANGIOGRAPHY CHEST WITH CONTRAST TECHNIQUE: Multidetector CT imaging of the chest was performed using the standard protocol during bolus administration of intravenous contrast. Multiplanar CT image reconstructions and MIPs were obtained to evaluate the vascular anatomy. CONTRAST:  70 mL Isovue 370 nonionic COMPARISON:   Chest radiograph April 20, 2016 ; chest CT October 19, 2009 FINDINGS: Mediastinum/Lymph Nodes: There is no demonstrable pulmonary embolus. There is no thoracic aortic aneurysm or dissection. The visualize great vessels show moderate calcification at their respective origins. There is atherosclerotic calcification throughout the aorta. There is extensive coronary artery calcification. Pericardium is not appreciably thickened. Heart is mildly enlarged. Pacemaker leads are attached to the right atrium and right ventricle. The left lobe of the thyroid is diffusely enlarged and inhomogeneous in attenuation, a finding not present on prior study. There are scattered small mediastinal lymph nodes. There is no frank adenopathy on this study. Lungs/Pleura: There is extensive airspace consolidation in the posterior segment left upper lobe. A lesser degree of infiltrate is noted in the posterior segment of the right upper lobe. Several tiny interstitial fibrosis throughout the lungs bilaterally, primarily in the lower lobes but also to some extent in the upper lobes, slightly more severe on the right than on the left. There is probable mild interstitial edema superimposed. There is a small left pleural effusion. Upper abdomen: Visualized upper abdominal structures appear unremarkable except for atherosclerotic calcification in the abdominal aorta. Musculoskeletal: There are no blastic or lytic bone lesions. Review of the MIP images confirms  the above findings. IMPRESSION: No demonstrable pulmonary embolus. Airspace consolidation in the posterior segments of both upper lobes, more severe on the left than on the right. These areas rib consistent with pneumonia. Suspect mild interstitial edema. There is a small left effusion. There is a degree of underlying parenchymal fibrosis in both upper and lower lobes with relative lower lobe predominance. These changes progressed since the 2010 study. Cardiomegaly. Pacemaker leads attached to  right atrium and right ventricle. The left lobe of the thyroid is diffusely enlarged and irregular and appearance. This finding represents a change compared to prior study. Nonemergent thyroid ultrasound is advised to assess for possible neoplasm arising from the left lobe of the thyroid. There are small lymph nodes present without frank adenopathy by size criteria. There is extensive atherosclerotic calcification. Extensive coronary artery calcification noted. Electronically Signed   By: Lowella Grip III M.D.   On: 04/20/2016 14:16   Dg Chest Port 1 View  04/10/2016  CLINICAL DATA:  Acute onset of generalized weakness, chest pain, fever and dysuria. Initial encounter. EXAM: PORTABLE CHEST 1 VIEW COMPARISON:  Chest radiograph performed 03/12/2016 FINDINGS: The lungs are hypoexpanded. Patchy left-sided airspace opacity could reflect mild pneumonia. There is no evidence of pleural effusion or pneumothorax. The cardiomediastinal silhouette is borderline enlarged. A pacemaker/AICD is noted overlying the left chest wall, with leads ending overlying the right atrium, right ventricle and coronary sinus. No acute osseous abnormalities are seen. IMPRESSION: Lungs hypoexpanded. Patchy left-sided airspace opacity could reflect mild pneumonia, given the patient's symptoms. Borderline cardiomegaly. Electronically Signed   By: Garald Balding M.D.   On: 04/10/2016 19:35     Assessment and Plan  Principal Problem:   COPD (chronic obstructive pulmonary disease) (HCC) Active Problems:   Orthostatic hypotension   NSTEMI (non-ST elevated myocardial infarction) (HCC)   Frequent falls   PNA (pneumonia)   Dizziness    1.Elevated troponin/CAD s/p prior stenting as above: -Had an episode of chest pain 2 days ago that lasted for less than 5 minutes, none since and no associated symptoms -Continue heparin gtt for 48 hours total. Can discontinue 6/7 -Schedule Lexiscan Myoview for 6/7 given her elevated troponin and  asymptomatic nature. She does not want to proceed with cardiac cath at this time -Recent echo 02/2016 -Troponin peak of 1.2, possible supply demand ischemia in the setting of her CHF/hypotension -Unable to rule out ACS at this time  2. Dizziness/pre-syncope/syncope/orthostatic hypotension: -Found to be orthostatic upon admission -Status post NS bolus -On midodrine as of April admission, though she was not taking this at home and this was not continued upon admission -Continue midodrine, titrate as needed -PT to see  3. Chronic combined CHF: -She does appear to be volume overloaded at this time -Continue Lasix -Not on BB upon admission, patient's choice   4. ILD: -Per IM  5. PNA: -On ABX per IM  6. CKD stage II-III: -Stable -Per IM  7. HLD: -Statin   Signed, Christell Faith, PA-C [facility] Pager: [phone] 04/22/2016, 11:22 AM

## 2016-04-22 NOTE — Evaluation (Signed)
Physical Therapy Evaluation Patient Details Name: Erica [name] MRN: [id number] DOB: December 26, 1930 Today's Date: 04/22/2016   History of Present Illness  Pt admitted for COPD with complaints of syncope and falls. Pt with positive orthostatics and increasing troponin. Pt now with decreasing troponin and is cleared by cardio for participation in therapy. Pt to decide on possible cardiac cath. Pt with history of CAD s/p stents, EF at 25% s/p ICD, HTN, and COPD. Pt also has pacemaker. Pt with initial complaints of SOB.   Clinical Impression  Pt is a pleasant 82 year old female who was admitted for COPD and complaints of syncope/falls. Pt performs bed mobility independently and transfers/ambulation with rw. Pt with unsteadiness noted during mobility along with decreased O2 sats to 87% with limited mobility to recliner. Pt cued for pursed lip breathing and is on room air. With seated rest break, sats improve to 91%. RN notified. Pt with no complaints of dizziness, however limited mobility secondary to SOB symptoms. Pt demonstrates deficits with strength/mobility/endurance. Would benefit from skilled PT to address above deficits and promote optimal return to PLOF; recommend transition to STR upon discharge from acute hospitalization.       Follow Up Recommendations SNF    Equipment Recommendations  None recommended by PT    Recommendations for Other Services       Precautions / Restrictions Precautions Precautions: Fall Restrictions Weight Bearing Restrictions: No      Mobility  Bed Mobility Overal bed mobility: Independent             General bed mobility comments: Pt able to get to sitting at EOB w/o assist  Transfers Overall transfer level: Needs assistance Equipment used: Rolling walker (2 wheeled) Transfers: Sit to/from Stand Sit to Stand: Min assist         General transfer comment: assist for upright posture and slight unsteadiness. Pt cued to use  rw  Ambulation/Gait Ambulation/Gait assistance: Min assist Ambulation Distance (Feet): 10 Feet Assistive device: Rolling walker (2 wheeled) Gait Pattern/deviations: Step-to pattern     General Gait Details: Pt has difficulty navigating obstacles and sequencing tasks. Takes assist for guidance and cues to line up in the middle of recliner chair prior to sitting down. Pt impulsive and needs cues for slowing movement down  Stairs            Wheelchair Mobility    Modified Rankin (Stroke Patients Only)       Balance Overall balance assessment: Needs assistance;History of Falls Sitting-balance support: Feet supported Sitting balance-Leahy Scale: Normal     Standing balance support: Bilateral upper extremity supported Standing balance-Leahy Scale: Fair                               Pertinent Vitals/Pain Pain Assessment: No/denies pain    Home Living Family/patient expects to be discharged to:: Skilled nursing facility Living Arrangements: Children Available Help at Discharge: Family;Available PRN/intermittently Type of Home: Apartment Home Access: Level entry     Home Layout: One level Home Equipment: Shower seat - built in;Walker - 4 wheels;Walker - 2 wheels;Wheelchair - manual Additional Comments: Pt recently at [facility] for SNF, plans to return back to SNF    Prior Function Level of Independence: Needs assistance   Gait / Transfers Assistance Needed: household distances only with multiple falls x 25months     Comments: Pt reports she has been using a rw primarily and passes out with limited mobility  Hand Dominance        Extremity/Trunk Assessment   Upper Extremity Assessment: Overall WFL for tasks assessed           Lower Extremity Assessment: Generalized weakness (grossly 4/5)         Communication   Communication: No difficulties  Cognition Arousal/Alertness: Awake/alert Behavior During Therapy: Impulsive (tries to get out of  bed multiple times prior to cues) Overall Cognitive Status: Within Functional Limits for tasks assessed                      General Comments      Exercises Other Exercises Other Exercises: Seated there-x performed including B hip abd/add, SAQ, hip add squeezes, and LAQ. All ther-ex performed x 10 reps with cga. Safe technique performed      Assessment/Plan    PT Assessment Patient needs continued PT services  PT Diagnosis Difficulty walking;Generalized weakness   PT Problem List Decreased strength;Decreased activity tolerance;Decreased balance;Decreased mobility  PT Treatment Interventions DME instruction;Gait training;Therapeutic exercise   PT Goals (Current goals can be found in the Care Plan section) Acute Rehab PT Goals Patient Stated Goal: I'm not sure why I'm getting SOB PT Goal Formulation: With patient Time For Goal Achievement: 05/06/16 Potential to Achieve Goals: Fair    Frequency Min 2X/week   Barriers to discharge        Co-evaluation               End of Session Equipment Utilized During Treatment: Gait belt Activity Tolerance: Patient tolerated treatment well Patient left: in chair;with chair alarm set Nurse Communication: Mobility status         Time: [id number] PT Time Calculation (min) (ACUTE ONLY): 15 min   Charges:   PT Evaluation $PT Eval Moderate Complexity: 1 Procedure PT Treatments $Therapeutic Exercise: 8-22 mins   PT G Codes:        Clarice Bonaventure 05/19/2016, 10:38 AM  Greggory Stallion, PT, DPT [phone]

## 2016-04-22 NOTE — Progress Notes (Signed)
CSW received phone call from patient's daughter [name]. Per [name] patient will accept bed at [facility]. CSW informed Maudie Mercury- Admissions coordinator at [facility] and informed her of above. CSW will continue follow and assist.   Ernest Pine, MSW, [place] Work Department [phone]

## 2016-04-22 NOTE — Progress Notes (Addendum)
ANTICOAGULATION CONSULT NOTE - Follow Up Consult  Pharmacy Consult for heparin drip dosing and monitoring Indication: chest pain/ACS  Allergies  Allergen Reactions  . Ciprofloxacin Anaphylaxis and Hives  . Ace Inhibitors Other (See Comments)    Reaction:  Unknown   . Amlodipine-Atorvastatin Other (See Comments)    Reaction:  Unknown   . Atorvastatin Other (See Comments)    Muscle cramps  . Amoxicillin Itching, Rash and Other (See Comments)    Has patient had a PCN reaction causing immediate rash, facial/tongue/throat swelling, SOB or lightheadedness with hypotension: No Has patient had a PCN reaction causing severe rash involving mucus membranes or skin necrosis: No Has patient had a PCN reaction that required hospitalization No Has patient had a PCN reaction occurring within the last 10 years: Yes If all of the above answers are "NO", then may proceed with Cephalosporin use.  . Quinolones Itching and Rash    Patient Measurements: Height: 5\' 2"  (157.5 cm) (stated) Weight: 141 lb 3.2 oz (64.048 kg) (admission) IBW/kg (Calculated) : 50.1 Heparin Dosing Weight: 63 kg  Vital Signs: Temp: 97.9 F (36.6 C) (06/06 0448) Temp Source: Oral (06/06 0448) BP: 121/73 mmHg (06/06 0902) Pulse Rate: 86 (06/06 0902)  Labs:  Recent Labs  04/20/16 1219 04/20/16 1822  04/21/16 0651 04/21/16 1542 04/21/16 1844 04/22/16 0049 04/22/16 1017  HGB 10.3*  --   --  9.7*  --   --  9.5*  --   HCT 30.5*  --   --  28.6*  --   --  27.9*  --   PLT 354  --   --  335  --   --  347  --   APTT 24  --   --   --   --   --   --   --   LABPROT 14.6  --   --   --   --   --   --   --   INR 1.12  --   --   --   --   --   --   --   HEPARINUNFRC  --   --   < > 0.39 0.21*  --  0.30 <0.10*  CREATININE 1.24*  --   --  1.11*  --   --   --   --   TROPONINI 0.65* 0.70*  --  1.21*  --  0.85*  --   --   < > = values in this interval not displayed. Estimated Creatinine Clearance: 33.8 mL/min (by C-G formula based  on Cr of 1.11).   Assessment: Pharmacy consulted to dose and monitor heparin drip in this 82 year old female with suspected NSTEMI. Patient was not taking anticoagulants prior to admission.  Baseline labs: Hgb 10.3 Plt 354  INR 1.12 APTT 24    Goal of Therapy:  Heparin level 0.3-0.7 units/ml Monitor platelets by anticoagulation protocol: Yes   Plan:  Heparin level <0.10 but per RN, RN found IV infiltrated this AM at 0830 for unknown period of time with arm swollen. This heparin level was drawn about one hour after new IV was placed and heparin drip restarted. Heparin drip also paused for ~15-20 min and line flushed before obtaining heparin level lab as pt is a hard stick. Will continue heparin drip at current rate and recheck heparin level at 1630. CBC in AM Hgb and plt count about stable from yesterday.   Pharmacy will continue to follow.   Rayna Sexton  L, Pharm.D., BCPS Clinical Pharmacist 04/22/2016,11:09 AM

## 2016-04-22 NOTE — Progress Notes (Signed)
ANTICOAGULATION CONSULT NOTE - Follow Up Consult  Pharmacy Consult for heparin drip dosing and monitoring Indication: chest pain/ACS  Allergies  Allergen Reactions  . Ciprofloxacin Anaphylaxis and Hives  . Ace Inhibitors Other (See Comments)    Reaction:  Unknown   . Amlodipine-Atorvastatin Other (See Comments)    Reaction:  Unknown   . Atorvastatin Other (See Comments)    Muscle cramps  . Amoxicillin Itching, Rash and Other (See Comments)    Has patient had a PCN reaction causing immediate rash, facial/tongue/throat swelling, SOB or lightheadedness with hypotension: No Has patient had a PCN reaction causing severe rash involving mucus membranes or skin necrosis: No Has patient had a PCN reaction that required hospitalization No Has patient had a PCN reaction occurring within the last 10 years: Yes If all of the above answers are "NO", then may proceed with Cephalosporin use.  . Quinolones Itching and Rash   Patient Measurements: Height: 5\' 2"  (157.5 cm) (stated) Weight: 141 lb 3.2 oz (64.048 kg) (admission) IBW/kg (Calculated) : 50.1 Heparin Dosing Weight: 63 kg  Vital Signs: BP: 145/60 mmHg (06/06 1143) Pulse Rate: 85 (06/06 1143)  Labs:  Recent Labs  04/20/16 1219 04/20/16 1822  04/21/16 0651  04/21/16 1844 04/22/16 0049 04/22/16 1017 04/22/16 1630  HGB 10.3*  --   --  9.7*  --   --  9.5*  --   --   HCT 30.5*  --   --  28.6*  --   --  27.9*  --   --   PLT 354  --   --  335  --   --  347  --   --   APTT 24  --   --   --   --   --   --   --   --   LABPROT 14.6  --   --   --   --   --   --   --   --   INR 1.12  --   --   --   --   --   --   --   --   HEPARINUNFRC  --   --   < > 0.39  < >  --  0.30 <0.10* 0.28*  CREATININE 1.24*  --   --  1.11*  --   --   --   --   --   TROPONINI 0.65* 0.70*  --  1.21*  --  0.85*  --   --   --   < > = values in this interval not displayed. Estimated Creatinine Clearance: 33.8 mL/min (by C-G formula based on Cr of  1.11).   Assessment: Pharmacy consulted to dose and monitor heparin drip in this 82 year old female with suspected NSTEMI. Patient was not taking anticoagulants prior to admission.  Baseline labs: Hgb 10.3 Plt 354  INR 1.12 APTT 24   Goal of Therapy:  Heparin level 0.3-0.7 units/ml Monitor platelets by anticoagulation protocol: Yes   Plan:  Heparin level of 0.28 drawn @ 1630 was slightly subtherapeutic at a rate of 1100 units/hr. Spoke with RN who confirmed there had been no issues or interruptions in heparin drip since this morning.  Will give a bolus of 900 units (~15 mg/kg) Will increase rate to 1200 units/hr (~19 units/kg/hr) per protocol  Will check heparin level in 8 hours.  Pharmacy will continue to follow.   Lenis Noon, Pharm.D. Clinical Pharmacist 04/22/2016,5:47 PM

## 2016-04-23 ENCOUNTER — Encounter (HOSPITAL_BASED_OUTPATIENT_CLINIC_OR_DEPARTMENT_OTHER): Payer: PPO

## 2016-04-23 ENCOUNTER — Encounter
Admission: RE | Admit: 2016-04-23 | Discharge: 2016-04-23 | Disposition: A | Discharge status: Home / Self Care | Payer: PPO | Source: Ambulatory Visit | Attending: Internal Medicine | Admitting: Internal Medicine

## 2016-04-23 ENCOUNTER — Encounter: Payer: Self-pay | Admitting: Radiology

## 2016-04-23 DIAGNOSIS — D649 Anemia, unspecified: Secondary | ICD-10-CM | POA: Insufficient documentation

## 2016-04-23 DIAGNOSIS — E871 Hypo-osmolality and hyponatremia: Secondary | ICD-10-CM | POA: Insufficient documentation

## 2016-04-23 DIAGNOSIS — R300 Dysuria: Secondary | ICD-10-CM | POA: Insufficient documentation

## 2016-04-23 DIAGNOSIS — I25810 Atherosclerosis of coronary artery bypass graft(s) without angina pectoris: Secondary | ICD-10-CM

## 2016-04-23 DIAGNOSIS — R55 Syncope and collapse: Secondary | ICD-10-CM | POA: Diagnosis not present

## 2016-04-23 DIAGNOSIS — R7989 Other specified abnormal findings of blood chemistry: Secondary | ICD-10-CM

## 2016-04-23 DIAGNOSIS — I214 Non-ST elevation (NSTEMI) myocardial infarction: Secondary | ICD-10-CM | POA: Diagnosis not present

## 2016-04-23 DIAGNOSIS — I5022 Chronic systolic (congestive) heart failure: Secondary | ICD-10-CM | POA: Diagnosis not present

## 2016-04-23 DIAGNOSIS — I509 Heart failure, unspecified: Secondary | ICD-10-CM | POA: Diagnosis not present

## 2016-04-23 DIAGNOSIS — E039 Hypothyroidism, unspecified: Secondary | ICD-10-CM | POA: Diagnosis not present

## 2016-04-23 DIAGNOSIS — I951 Orthostatic hypotension: Secondary | ICD-10-CM | POA: Diagnosis not present

## 2016-04-23 LAB — HEPARIN LEVEL (UNFRACTIONATED): Heparin Unfractionated: 0.48 IU/mL (ref 0.30–0.70)

## 2016-04-23 LAB — BASIC METABOLIC PANEL
Anion gap: 6 (ref 5–15)
BUN: 20 mg/dL (ref 6–20)
CHLORIDE: 105 mmol/L (ref 101–111)
CO2: 25 mmol/L (ref 22–32)
CREATININE: 1.25 mg/dL — AB (ref 0.44–1.00)
Calcium: 8.4 mg/dL — ABNORMAL LOW (ref 8.9–10.3)
GFR calc Af Amer: 45 mL/min — ABNORMAL LOW (ref >60)
GFR calc non Af Amer: 39 mL/min — ABNORMAL LOW (ref >60)
Glucose, Bld: 101 mg/dL — ABNORMAL HIGH (ref 65–99)
Potassium: 4 mmol/L (ref 3.5–5.1)
Sodium: 136 mmol/L (ref 135–145)

## 2016-04-23 LAB — CBC
HEMATOCRIT: 28.6 % — AB (ref 35.0–47.0)
HEMOGLOBIN: 10 g/dL — AB (ref 12.0–16.0)
MCH: 31 pg (ref 26.0–34.0)
MCHC: 34.9 g/dL (ref 32.0–36.0)
MCV: 88.9 fL (ref 80.0–100.0)
Platelets: 364 10*3/uL (ref 150–440)
RBC: 3.21 MIL/uL — ABNORMAL LOW (ref 3.80–5.20)
RDW: 15.5 % — ABNORMAL HIGH (ref 11.5–14.5)
WBC: 6.9 10*3/uL (ref 3.6–11.0)

## 2016-04-23 MED ORDER — TECHNETIUM TC 99M TETROFOSMIN IV KIT
30.0000 | PACK | Freq: Once | INTRAVENOUS | Status: AC | PRN
  Administered 2016-04-23: 31.041 via INTRAVENOUS

## 2016-04-23 MED ORDER — MIDODRINE HCL 5 MG PO TABS: 5.0000 mg | ORAL_TABLET | Freq: Three times a day (TID) | ORAL | Status: DC

## 2016-04-23 MED ORDER — LORAZEPAM 1 MG PO TABS
1.0000 mg | ORAL_TABLET | Freq: Every evening | ORAL | Status: DC | PRN
Start: 2016-04-23 — End: 2016-11-13

## 2016-04-23 MED ORDER — HYDROCOD POLST-CPM POLST ER 10-8 MG/5ML PO SUER: 5.0000 mL | Freq: Two times a day (BID) | ORAL | Status: DC | PRN

## 2016-04-23 MED ORDER — REGADENOSON 0.4 MG/5ML IV SOLN
0.4000 mg | Freq: Once | INTRAVENOUS | Status: AC
  Administered 2016-04-23: 0.4 mg via INTRAVENOUS

## 2016-04-23 MED ORDER — FUROSEMIDE 20 MG PO TABS: 20.0000 mg | ORAL_TABLET | Freq: Every day | ORAL | Status: DC

## 2016-04-23 MED ORDER — TECHNETIUM TC 99M TETROFOSMIN IV KIT
12.4900 | PACK | Freq: Once | INTRAVENOUS | Status: AC | PRN
  Administered 2016-04-23: 12.49 via INTRAVENOUS

## 2016-04-23 NOTE — Clinical Social Work Placement (Signed)
   CLINICAL SOCIAL WORK PLACEMENT  NOTE  Date:  04/23/2016  Patient Details  Name: Chinmayi Mullis MRN: [id number] Date of Birth: 04-29-1931  Clinical Social Work is seeking post-discharge placement for this patient at the [place] level of care (*CSW will initial, date and re-position this form in  chart as items are completed):  No   Patient/family provided with [place] Work Department's list of facilities offering this level of care within the geographic area requested by the patient (or if unable, by the patient's family).  No   Patient/family informed of their freedom to choose among providers that offer the needed level of care, that participate in Medicare, Medicaid or managed care program needed by the patient, have an available bed and are willing to accept the patient.  No   Patient/family informed of [place]'s ownership interest in [facility] and [facility], as well as of the fact that they are under no obligation to receive care at these facilities.  PASRR submitted to EDS on       PASRR number received on       Existing PASRR number confirmed on 04/23/16     FL2 transmitted to all facilities in geographic area requested by pt/family on 04/23/16     FL2 transmitted to all facilities within larger geographic area on       Patient informed that his/her managed care company has contracts with or will negotiate with certain facilities, including the following:        Yes   Patient/family informed of bed offers received.  Patient chooses bed at  [facility])     Physician recommends and patient chooses bed at      Patient to be transferred to  [facility]) on 04/23/16.  Patient to be transferred to facility by  [name]- daughter )     Patient family notified on 04/23/16 of transfer.  Name of family member notified:   (ori- daughter)     PHYSICIAN       Additional Comment:     _______________________________________________ Baldemar Lenis, LCSW 04/23/2016, 1:48 PM

## 2016-04-23 NOTE — Progress Notes (Signed)
ANTICOAGULATION CONSULT NOTE - Follow Up Consult  Pharmacy Consult for heparin drip dosing and monitoring Indication: chest pain/ACS  Allergies  Allergen Reactions  . Ciprofloxacin Anaphylaxis and Hives  . Ace Inhibitors Other (See Comments)    Reaction:  Unknown   . Amlodipine-Atorvastatin Other (See Comments)    Reaction:  Unknown   . Atorvastatin Other (See Comments)    Muscle cramps  . Amoxicillin Itching, Rash and Other (See Comments)    Has patient had a PCN reaction causing immediate rash, facial/tongue/throat swelling, SOB or lightheadedness with hypotension: No Has patient had a PCN reaction causing severe rash involving mucus membranes or skin necrosis: No Has patient had a PCN reaction that required hospitalization No Has patient had a PCN reaction occurring within the last 10 years: Yes If all of the above answers are "NO", then may proceed with Cephalosporin use.  . Quinolones Itching and Rash   Patient Measurements: Height: 5\' 2"  (157.5 cm) (stated) Weight: 141 lb 3.2 oz (64.048 kg) (admission) IBW/kg (Calculated) : 50.1 Heparin Dosing Weight: 63 kg  Vital Signs: Temp: 97.7 F (36.5 C) (06/06 2009) Temp Source: Oral (06/06 2009) BP: 150/65 mmHg (06/06 2009) Pulse Rate: 80 (06/06 2009)  Labs:  Recent Labs  04/20/16 1219 04/20/16 1822  04/21/16 0651  04/21/16 1844 04/22/16 0049 04/22/16 1017 04/22/16 1630 04/23/16 0211  HGB 10.3*  --   --  9.7*  --   --  9.5*  --   --  10.0*  HCT 30.5*  --   --  28.6*  --   --  27.9*  --   --  28.6*  PLT 354  --   --  335  --   --  347  --   --  364  APTT 24  --   --   --   --   --   --   --   --   --   LABPROT 14.6  --   --   --   --   --   --   --   --   --   INR 1.12  --   --   --   --   --   --   --   --   --   HEPARINUNFRC  --   --   < > 0.39  < >  --  0.30 <0.10* 0.28* 0.48  CREATININE 1.24*  --   --  1.11*  --   --   --   --   --  1.25*  TROPONINI 0.65* 0.70*  --  1.21*  --  0.85*  --   --   --   --   < > =  values in this interval not displayed. Estimated Creatinine Clearance: 30 mL/min (by C-G formula based on Cr of 1.25).   Assessment: Pharmacy consulted to dose and monitor heparin drip in this 82 year old female with suspected NSTEMI. Patient was not taking anticoagulants prior to admission.  Baseline labs: Hgb 10.3 Plt 354  INR 1.12 APTT 24   Goal of Therapy:  Heparin level 0.3-0.7 units/ml Monitor platelets by anticoagulation protocol: Yes   Plan:  Heparin level therapeutic x 1. Continue current rate. Recheck heparin level in 8 hours.  Laural Benes, Pharm.D., BCPS Clinical Pharmacist 04/23/2016,3:04 AM

## 2016-04-23 NOTE — Care Management Important Message (Signed)
Important Message  Patient Details  Name: Nitra Dsouza MRN: [id number] Date of Birth: 06/24/1931   Medicare Important Message Given:  Yes    Jolly Mango, RN 04/23/2016, 2:35 PM

## 2016-04-23 NOTE — Progress Notes (Signed)
Patient: Erica [name] / Admit Date: 04/20/2016 / Date of Encounter: 04/23/2016, 8:03 AM   Subjective: No acute overnight events. She is for [facility] this morning. BP and dizziness much improved since starting midodrine. SOB and cough improving. No chest pain.   Review of Systems: Review of Systems  Constitutional: Positive for weight loss and malaise/fatigue. Negative for fever, chills and diaphoresis.  HENT: Negative for congestion.   Eyes: Negative for discharge and redness.  Respiratory: Positive for cough and shortness of breath. Negative for hemoptysis, sputum production and wheezing.        SOB improved  Cardiovascular: Negative for chest pain, palpitations, orthopnea, claudication, leg swelling and PND.  Gastrointestinal: Negative for nausea, vomiting and abdominal pain.  Musculoskeletal: Negative for myalgias and falls.  Skin: Negative for rash.  Neurological: Negative for dizziness, sensory change, speech change, focal weakness, loss of consciousness and weakness.  Endo/Heme/Allergies: Does not bruise/bleed easily.  Psychiatric/Behavioral: Negative for substance abuse. The patient is not nervous/anxious.   All other systems reviewed and are negative.   Objective: Telemetry: V paced, 90's bpm Physical Exam: Blood pressure 138/57, pulse 82, temperature 97.5 F (36.4 C), temperature source Oral, resp. rate 20, height 5\' 2"  (1.575 m), weight 141 lb 3.2 oz (64.048 kg), SpO2 92 %. Body mass index is 25.82 kg/(m^2). General: Well developed, well nourished, in no acute distress. Head: Normocephalic, atraumatic, sclera non-icteric, no xanthomas, nares are without discharge. Neck: Negative for carotid bruits. JVP not elevated. Lungs: Improving breath sounds bilaterally. Breathing is unlabored. Heart: RRR S1 S2 without murmurs, rubs, or gallops.  Abdomen: Soft, non-tender, non-distended with normoactive bowel sounds. No rebound/guarding. Extremities: No clubbing or  cyanosis. No edema. Distal pedal pulses are 2+ and equal bilaterally. Neuro: Alert and oriented X 3. Moves all extremities spontaneously. Psych:  Responds to questions appropriately with a normal affect.   Intake/Output Summary (Last 24 hours) at 04/23/16 0803 Last data filed at 04/23/16 0641  Gross per 24 hour  Intake    600 ml  Output   1650 ml  Net  -1050 ml    Inpatient Medications:  . aspirin  81 mg Oral Daily  . cholecalciferol  1,000 Units Oral Daily  . clopidogrel  75 mg Oral Daily  . docusate sodium  100 mg Oral BID  . furosemide  20 mg Oral Daily  . levothyroxine  75 mcg Oral Q0600  . midodrine  5 mg Oral TID WC  . mometasone-formoterol  2 puff Inhalation BID  . multivitamin with minerals  1 tablet Oral Daily  . pravastatin  10 mg Oral QHS  . sodium chloride flush  3 mL Intravenous Q12H   Infusions:  . heparin 1,200 Units/hr (04/22/16 1808)    Labs:  Recent Labs  04/20/16 1219 04/21/16 0651 04/23/16 0211  NA 136 137 136  K 4.5 4.5 4.0  CL 101 104 105  CO2 27 27 25   GLUCOSE 109* 92 101*  BUN 15 16 20   CREATININE 1.24* 1.11* 1.25*  CALCIUM 8.6* 8.5* 8.4*  MG 2.1  --   --     Recent Labs  04/20/16 1219  AST 24  ALT 30  ALKPHOS 167*  BILITOT 0.3  PROT 6.9  ALBUMIN 2.6*    Recent Labs  04/20/16 1219  04/22/16 0049 04/23/16 0211  WBC 9.9  < > 6.7 6.9  NEUTROABS 8.6*  --   --   --   HGB 10.3*  < > 9.5* 10.0*  HCT 30.5*  < > 27.9* 28.6*  MCV 90.2  < > 89.1 88.9  PLT 354  < > 347 364  < > = values in this interval not displayed.  Recent Labs  04/20/16 1219 04/20/16 1822 04/21/16 0651 04/21/16 1844  TROPONINI 0.65* 0.70* 1.21* 0.85*   Invalid input(s): POCBNP No results for input(s): HGBA1C in the last 72 hours.   Weights: Filed Weights   04/20/16 1108 04/20/16 1715  Weight: 146 lb (66.225 kg) 141 lb 3.2 oz (64.048 kg)     Radiology/Studies:  Dg Chest 2 View  04/20/2016  CLINICAL DATA:  Low oxygen saturation.  Shortness of  breath. EXAM: CHEST  2 VIEW COMPARISON:  04/15/2016 FINDINGS: Left chest wall ICD is noted with leads in the right atrial appendage, coronary sinus and right ventricle. Moderate cardiac enlargement. There is a small left pleural effusion. Mild diffuse interstitial edema is superimposed upon chronic lung disease. IMPRESSION: 1. Cardiac enlargement, aortic atherosclerosis and mild CHF 2. Chronic lung disease. Electronically Signed   By: Kerby Moors M.D.   On: 04/20/2016 11:46   Dg Chest 2 View  04/15/2016  CLINICAL DATA:  82 year old with current history of COPD, coronary artery disease, CHF and personal history of right breast cancer, presenting with chronic cough. Former smoker. Followup left lower lobe pneumonia. EXAM: CHEST  2 VIEW COMPARISON:  04/10/2016 and earlier. FINDINGS: AP sitting erect and lateral images were obtained. Suboptimal inspiration. Patchy airspace consolidation in the left lower lobe, unchanged since the examination 5 days ago. Small left pleural effusion. Mild atelectasis in the right lower lobe and right middle lobe. No new pulmonary parenchymal abnormalities. Stable mild moderate cardiomegaly. Biventricular pacing defibrillator unchanged and appears intact. Right coronary artery stent. IMPRESSION: Stable left lower lobe pneumonia and associated small left pleural effusion. Mild atelectasis in the right middle lobe and right lower lobe related to suboptimal inspiration. Stable cardiomegaly without pulmonary edema. Electronically Signed   By: Evangeline Dakin M.D.   On: 04/15/2016 10:41   Ct Angio Chest Pe W/cm &/or Wo Cm  04/20/2016  CLINICAL DATA:  Shortness of breath and tachycardia. History of breast carcinoma EXAM: CT ANGIOGRAPHY CHEST WITH CONTRAST TECHNIQUE: Multidetector CT imaging of the chest was performed using the standard protocol during bolus administration of intravenous contrast. Multiplanar CT image reconstructions and MIPs were obtained to evaluate the vascular  anatomy. CONTRAST:  70 mL Isovue 370 nonionic COMPARISON:  Chest radiograph April 20, 2016 ; chest CT October 19, 2009 FINDINGS: Mediastinum/Lymph Nodes: There is no demonstrable pulmonary embolus. There is no thoracic aortic aneurysm or dissection. The visualize great vessels show moderate calcification at their respective origins. There is atherosclerotic calcification throughout the aorta. There is extensive coronary artery calcification. Pericardium is not appreciably thickened. Heart is mildly enlarged. Pacemaker leads are attached to the right atrium and right ventricle. The left lobe of the thyroid is diffusely enlarged and inhomogeneous in attenuation, a finding not present on prior study. There are scattered small mediastinal lymph nodes. There is no frank adenopathy on this study. Lungs/Pleura: There is extensive airspace consolidation in the posterior segment left upper lobe. A lesser degree of infiltrate is noted in the posterior segment of the right upper lobe. Several tiny interstitial fibrosis throughout the lungs bilaterally, primarily in the lower lobes but also to some extent in the upper lobes, slightly more severe on the right than on the left. There is probable mild interstitial edema superimposed. There is a small left pleural effusion. Upper  abdomen: Visualized upper abdominal structures appear unremarkable except for atherosclerotic calcification in the abdominal aorta. Musculoskeletal: There are no blastic or lytic bone lesions. Review of the MIP images confirms the above findings. IMPRESSION: No demonstrable pulmonary embolus. Airspace consolidation in the posterior segments of both upper lobes, more severe on the left than on the right. These areas rib consistent with pneumonia. Suspect mild interstitial edema. There is a small left effusion. There is a degree of underlying parenchymal fibrosis in both upper and lower lobes with relative lower lobe predominance. These changes progressed  since the 2010 study. Cardiomegaly. Pacemaker leads attached to right atrium and right ventricle. The left lobe of the thyroid is diffusely enlarged and irregular and appearance. This finding represents a change compared to prior study. Nonemergent thyroid ultrasound is advised to assess for possible neoplasm arising from the left lobe of the thyroid. There are small lymph nodes present without frank adenopathy by size criteria. There is extensive atherosclerotic calcification. Extensive coronary artery calcification noted. Electronically Signed   By: Lowella Grip III M.D.   On: 04/20/2016 14:16   Dg Chest Port 1 View  04/10/2016  CLINICAL DATA:  Acute onset of generalized weakness, chest pain, fever and dysuria. Initial encounter. EXAM: PORTABLE CHEST 1 VIEW COMPARISON:  Chest radiograph performed 03/12/2016 FINDINGS: The lungs are hypoexpanded. Patchy left-sided airspace opacity could reflect mild pneumonia. There is no evidence of pleural effusion or pneumothorax. The cardiomediastinal silhouette is borderline enlarged. A pacemaker/AICD is noted overlying the left chest wall, with leads ending overlying the right atrium, right ventricle and coronary sinus. No acute osseous abnormalities are seen. IMPRESSION: Lungs hypoexpanded. Patchy left-sided airspace opacity could reflect mild pneumonia, given the patient's symptoms. Borderline cardiomegaly. Electronically Signed   By: Garald Balding M.D.   On: 04/10/2016 19:35     Assessment and Plan  Principal Problem:   COPD (chronic obstructive pulmonary disease) (HCC) Active Problems:   Orthostatic hypotension   NSTEMI (non-ST elevated myocardial infarction) (HCC)   Frequent falls   PNA (pneumonia)   Dizziness   Chronic systolic congestive heart failure ([place])    1. Elevated troponin/CAD s/p prior stenting as above: -Had an episode of chest pain 2 days prior to her admission that lasted for less than 5 minutes, none since and no associated  symptoms -Continue heparin gtt for 48 hours total. Can discontinue 6/7 -She is for Lexiscan Myoview this morning given her elevated troponin and asymptomatic nature. She does not want to proceed with cardiac cath at this time -Recent echo 02/2016 -Troponin peak of 1.2, possible supply demand ischemia in the setting of her CHF/hypotension -Unable to rule out ACS at this time  2. Dizziness/pre-syncope/syncope/orthostatic hypotension: -Found to be orthostatic upon admission -Status post NS bolus -On midodrine as of April admission, though she was not taking this at home and this was not continued upon admission -Continue midodrine, titrate as needed -BP stable -PT to see  3. Chronic combined CHF s/p ICD: -She does appear to be volume overloaded at this time -Continue Lasix -Not on BB upon admission, patient's choice   4. ILD: -Per IM  5. PNA: -On ABX per IM  6. CKD stage II-III: -Stable -Per IM  7. HLD: -Statin   Signed, Christell Faith, PA-C [facility] Pager: [phone] 04/23/2016, 8:03 AM

## 2016-04-23 NOTE — Progress Notes (Addendum)
Discharge information faxed to [facility] via [place]. RN notified.  Ernest Pine, MSW, [place] Social Work Department [phone]

## 2016-04-23 NOTE — Progress Notes (Signed)
CSW received [facility] authorization. Auth # [id number]. CSW informed Maudie Mercury- Development worker, international aid at [place].   Ernest Pine, MSW, [place] Social Work Department [phone]

## 2016-04-23 NOTE — Discharge Summary (Signed)
[facility] [place] NAME: Erica [name]    MR#:  [id number]  DATE OF BIRTH:  01/14/1931  DATE OF ADMISSION:  04/20/2016 ADMITTING PHYSICIAN: Gladstone Lighter, MD  DATE OF DISCHARGE: 04/23/2016  PRIMARY CARE PHYSICIAN: Kirk Ruths., MD    ADMISSION DIAGNOSIS:  Shortness of breath [R06.02] Thyroid nodule [E04.1] Community acquired pneumonia [J18.9] NSTEMI (non-ST elevated myocardial infarction) ([place]) [I21.4] Near syncope [R55]  DISCHARGE DIAGNOSIS:  Principal Problem:   COPD (chronic obstructive pulmonary disease) (HCC) Active Problems:   Orthostatic hypotension   Frequent falls   NSTEMI (non-ST elevated myocardial infarction) (HCC)   PNA (pneumonia)   Dizziness   Chronic systolic congestive heart failure ([place])   SECONDARY DIAGNOSIS:   Past Medical History  Diagnosis Date  . Nonischemic cardiomyopathy ([place])     a. s/p Medtronic CRT-D, NYHA II-III at baseline; b. echo 2011: EF 43%, mildly dilated LA, PASP 24mm Hg; c. echo 3/16: EF 35-40%, GR1DD, mild to mod AI, mild MR, PASP 65 mmHg  . Coronary artery disease     a. s/p prior stent-RCA; b. cath 6/14: mild to mod prox to mid LAD dz, mod prox to dRCA at 60% unchanged from prior cath; c. nuc 3/16: no sig ischemia, nl WM, EF 19%  . History of MI (myocardial infarction)   . Dyslipidemia   . Hypertension   . COPD (chronic obstructive pulmonary disease) ([place])   . Hypothyroidism   . Basal cell carcinoma 2011  . Respiratory infection   . Vitamin D deficiency   . Breast cancer ([place]) 30+ yrs. ago    right breast, radiation  . TIA (transient ischemic attack) 02/2016    a. negative head CT x 2, unable to have MRI brain given ICD; b. carotid doppler with 0-49% stenosis bilaterally with antegrade flow bilaterally   . Blind left eye   . Frequent falls   . LBBB (left bundle branch block)   . Orthostatic hypotension     HOSPITAL COURSE:   Erica [name] is a 82 y.o.  female with a known history of CAD status post stents, TIA/CVA in April 2017, combined ischemic cardiomyopathy with EF of 35% status post ICD and pacemaker, chronic interstitial lung disease, hypertension presents to the hospital from [facility] secondary to recurrent dizziness and syncopal episodes.  # NSTEMI -Cardiology consulted. No EKG changes and no chest pain.Could be demand ischemia but cannot rule out acute coronary syndrome -Patient not interested in invasive procedure like cardiac catheterization -Received IV heparin in the hospital for 3 days.  -Continue aspirin, Plavix and low-dose metoprolol, statin. - Myoview today done and pending -Appreciate cardiology consult  # Recovering pneumonia Finished her course of Keflex and azithromycin  # Recurrent syncope, dizziness- secondary to orthostatic hypotension. -had extensive workup done prior. MRI cannot be done due to her pacemaker. Carotid Dopplers did not show any atherosclerotic occlusive disease. Patient is on aspirin and Plavix. -Has a pacemaker.  - She does have chronic orthostatic hypotension which is improved since starting midodrine. Her dizziness has improved as well -Physical therapy consulted. Daughter considering long-term care.  # Chronic Systolic CHF- Last EF of [id number]. -Restarted on her Lasix now that her blood pressure is better   # Interstitial lung disease currently on 2 L oxygen As needed. Stable. Outpatient pulmonary follow-up. Continue home inhalers. Cough medicines as needed.  Patient will be discharged today once Myoview is negative.   DISCHARGE CONDITIONS:   Stable  CONSULTS OBTAINED:  Treatment Team:  Wellington Hampshire, MD  DRUG ALLERGIES:   Allergies  Allergen Reactions  . Ciprofloxacin Anaphylaxis and Hives  . Ace Inhibitors Other (See Comments)    Reaction:  Unknown   . Amlodipine-Atorvastatin Other (See Comments)    Reaction:  Unknown   . Atorvastatin Other (See  Comments)    Muscle cramps  . Amoxicillin Itching, Rash and Other (See Comments)    Has patient had a PCN reaction causing immediate rash, facial/tongue/throat swelling, SOB or lightheadedness with hypotension: No Has patient had a PCN reaction causing severe rash involving mucus membranes or skin necrosis: No Has patient had a PCN reaction that required hospitalization No Has patient had a PCN reaction occurring within the last 10 years: Yes If all of the above answers are "NO", then may proceed with Cephalosporin use.  . Quinolones Itching and Rash    DISCHARGE MEDICATIONS:   Current Discharge Medication List    START taking these medications   Details  midodrine (PROAMATINE) 5 MG tablet Take 1 tablet (5 mg total) by mouth 3 (three) times daily with meals. Qty: 90 tablet, Refills: 2      CONTINUE these medications which have CHANGED   Details  chlorpheniramine-HYDROcodone (TUSSIONEX PENNKINETIC ER) 10-8 MG/5ML SUER Take 5 mLs by mouth every 12 (twelve) hours as needed for cough. Qty: 115 mL, Refills: 0    furosemide (LASIX) 20 MG tablet Take 1 tablet (20 mg total) by mouth daily. Qty: 30 tablet, Refills: 0    LORazepam (ATIVAN) 1 MG tablet Take 1 tablet (1 mg total) by mouth at bedtime as needed for anxiety or sleep. Qty: 15 tablet, Refills: 0      CONTINUE these medications which have NOT CHANGED   Details  acetaminophen (TYLENOL) 325 MG tablet Take 325 mg by mouth every 6 (six) hours as needed.    aspirin 81 MG chewable tablet Chew 1 tablet (81 mg total) by mouth daily. Qty: 30 tablet, Refills: 2    Cholecalciferol 2000 units CAPS Take 1 capsule by mouth daily.    clopidogrel (PLAVIX) 75 MG tablet Take 1 tablet (75 mg total) by mouth daily. Qty: 30 tablet, Refills: 2    Fluticasone-Salmeterol (ADVAIR DISKUS) 250-50 MCG/DOSE AEPB Inhale 1 puff into the lungs 2 (two) times daily. Qty: 60 each, Refills: 1    levothyroxine (SYNTHROID, LEVOTHROID) 75 MCG tablet Take 75  mcg by mouth daily before breakfast.     Multiple Vitamins-Minerals (MULTIVITAMIN GUMMIES ADULT PO) Take 1 each by mouth daily at 12 noon.     pravastatin (PRAVACHOL) 10 MG tablet Take 10 mg by mouth at bedtime.  Refills: 0      STOP taking these medications     azithromycin (ZITHROMAX) 250 MG tablet      cephALEXin (KEFLEX) 500 MG capsule          DISCHARGE INSTRUCTIONS:   1. PCP f/u in 1-2 weeks 2. Cardiology f/u in 2 weeks   If you experience worsening of your admission symptoms, develop shortness of breath, life threatening emergency, suicidal or homicidal thoughts you must seek medical attention immediately by calling 911 or calling your MD immediately  if symptoms less severe.  You Must read complete instructions/literature along with all the possible adverse reactions/side effects for all the Medicines you take and that have been prescribed to you. Take any new Medicines after you have completely understood and accept all the possible adverse reactions/side effects.   Please note  You were cared for by a hospitalist during your hospital stay. If you have any questions about your discharge medications or the care you received while you were in the hospital after you are discharged, you can call the unit and asked to speak with the hospitalist on call if the hospitalist that took care of you is not available. Once you are discharged, your primary care physician will handle any further medical issues. Please note that NO REFILLS for any discharge medications will be authorized once you are discharged, as it is imperative that you return to your primary care physician (or establish a relationship with a primary care physician if you do not have one) for your aftercare needs so that they can reassess your need for medications and monitor your lab values.    Today   CHIEF COMPLAINT:   Chief Complaint  Patient presents with  . Shortness of Breath    VITAL SIGNS:  Blood  pressure 138/57, pulse 82, temperature 97.5 F (36.4 C), temperature source Oral, resp. rate 20, height 5\' 2"  (1.575 m), weight 64.048 kg (141 lb 3.2 oz), SpO2 92 %.  I/O:   Intake/Output Summary (Last 24 hours) at 04/23/16 1223 Last data filed at 04/23/16 0641  Gross per 24 hour  Intake    480 ml  Output   1350 ml  Net   -870 ml    PHYSICAL EXAMINATION:   Physical Exam  GENERAL: 82 y.o.-year-old patient lying in the bed with no acute distress.  EYES: Pupils equal, round, reactive to light and accommodation. No scleral icterus. Extraocular muscles intact.  HEENT: Head atraumatic, normocephalic. Oropharynx and nasopharynx clear.  NECK: Supple, no jugular venous distention. No thyroid enlargement, no tenderness.  LUNGS: Normal breath sounds bilaterally, no wheezing,rhonchi. No use of accessory muscles of respiration. Fine bibasilar crackles heard. CARDIOVASCULAR: S1, S2 normal. No rubs, or gallops. 2/6 systolic murmur is present ABDOMEN: Soft, nontender, nondistended. Bowel sounds present. No organomegaly or mass.  EXTREMITIES: No cyanosis, clubbing or edema b/l.  NEUROLOGIC: Cranial nerves II through XII are intact. No focal Motor or sensory deficits b/l.  PSYCHIATRIC: The patient is alert and oriented x 3.  SKIN: No obvious rash, lesion, or ulcer.    DATA REVIEW:   CBC  Recent Labs Lab 04/23/16 0211  WBC 6.9  HGB 10.0*  HCT 28.6*  PLT 364    Chemistries   Recent Labs Lab 04/20/16 1219  04/23/16 0211  NA 136  < > 136  K 4.5  < > 4.0  CL 101  < > 105  CO2 27  < > 25  GLUCOSE 109*  < > 101*  BUN 15  < > 20  CREATININE 1.24*  < > 1.25*  CALCIUM 8.6*  < > 8.4*  MG 2.1  --   --   AST 24  --   --   ALT 30  --   --   ALKPHOS 167*  --   --   BILITOT 0.3  --   --   < > = values in this interval not displayed.  Cardiac Enzymes  Recent Labs Lab 04/21/16 1844  TROPONINI 0.85*    Microbiology Results  Results for orders placed or performed  during the hospital encounter of 04/10/16  Urine culture     Status: Abnormal   Collection Time: 04/10/16  5:46 PM  Result Value Ref Range Status   Specimen Description URINE, RANDOM  Final   Special Requests NONE  Final  Culture >=100,000 COLONIES/mL ESCHERICHIA COLI (A)  Final   Report Status 04/13/2016 FINAL  Final   Organism ID, Bacteria ESCHERICHIA COLI (A)  Final      Susceptibility   Escherichia coli - MIC*    AMPICILLIN <=2 SENSITIVE Sensitive     CEFAZOLIN <=4 SENSITIVE Sensitive     CEFTRIAXONE <=1 SENSITIVE Sensitive     CIPROFLOXACIN <=0.25 SENSITIVE Sensitive     GENTAMICIN <=1 SENSITIVE Sensitive     IMIPENEM <=0.25 SENSITIVE Sensitive     NITROFURANTOIN <=16 SENSITIVE Sensitive     TRIMETH/SULFA <=20 SENSITIVE Sensitive     AMPICILLIN/SULBACTAM <=2 SENSITIVE Sensitive     PIP/TAZO <=4 SENSITIVE Sensitive     Extended ESBL NEGATIVE Sensitive     * >=100,000 COLONIES/mL ESCHERICHIA COLI  Culture, blood (routine x 2)     Status: None   Collection Time: 04/10/16  7:24 PM  Result Value Ref Range Status   Specimen Description BLOOD LEFT ASSIST CONTROL  Final   Special Requests   Final    BOTTLES DRAWN AEROBIC AND ANAEROBIC [place] AERO 8CC ANA   Culture NO GROWTH 5 DAYS  Final   Report Status 04/15/2016 FINAL  Final  Culture, blood (routine x 2)     Status: None   Collection Time: 04/10/16  7:30 PM  Result Value Ref Range Status   Specimen Description BLOOD RIGHT ASSIST CONTROL  Final   Special Requests BOTTLES DRAWN AEROBIC AND ANAEROBIC 8CC  Final   Culture NO GROWTH 5 DAYS  Final   Report Status 04/15/2016 FINAL  Final    RADIOLOGY:  No results found.  EKG:   Orders placed or performed during the hospital encounter of 04/20/16  . EKG 12-Lead  . EKG 12-Lead  . ED EKG  . ED EKG  . EKG 12-Lead  . EKG 12-Lead  . EKG 12-Lead  . EKG 12-Lead  . EKG 12-Lead  . EKG 12-Lead      Management plans discussed with the patient, family and they are in  agreement.  CODE STATUS:     Code Status Orders        Start     Ordered   04/20/16 1802  Full code   Continuous     04/20/16 1801    Code Status History    Date Active Date Inactive Code Status Order ID Comments User Context   04/10/2016 11:23 PM 04/15/2016  7:57 PM Full Code [id number]  Lance Coon, MD Inpatient   03/12/2016  4:31 PM 03/14/2016 10:20 PM Full Code [id number]  Loletha Grayer, MD ED    Advance Directive Documentation        Most Recent Value   Type of Advance Directive  Living will   Pre-existing out of facility DNR order (yellow form or pink MOST form)     "MOST" Form in Place?        TOTAL TIME TAKING CARE OF THIS PATIENT: 38 minutes.    Gladstone Lighter M.D on 04/23/2016 at 12:23 PM  Between 7am to 6pm - Pager - [phone]  After 6pm go to [email or web address] - password [email or web address] [facility]  Office  [phone]  CC: Primary care physician; Kirk Ruths., MD

## 2016-04-23 NOTE — Progress Notes (Addendum)
Clinical Social Worker was informed that patient will be medically ready to discharge to [facility]. Patient and her daughter [name] are in a agreement with plan. CSW called Maudie Mercury- Admissions Coordinator to confirm that patient's bed is ready. Provided patient's room number 305 and number to call for report [phone]. Awaiting for MD To cosign FL2 and then CSW will fax discharge information faxed to [facility] via [place]. RN will call report and patient will discharge to [facility] via Family- [name]- Daughter).  Ernest Pine, MSW, [place] Social Work Department [phone]

## 2016-04-23 NOTE — Progress Notes (Signed)
CSW was informed by RN that patient is going to have a procedure tomorrow and that her discharge was canceled. CSW informed Maudie Mercury- Admissions Coordinator with [facility] of above. Patient's [facility] authorization is good for 48 hours. CSW will continue to follow and assist.   Ernest Pine, MSW, [place] Work Department [phone]

## 2016-04-23 NOTE — Progress Notes (Signed)
Dr. Tressia Miners and Margreta Journey (SW) notified that patient's stress test was positive and has elected to have cath tomorrow. Wilnette Kales

## 2016-04-23 NOTE — Progress Notes (Signed)
Stress test was mildly positive. Findings explained to patient and daughter. In light of her troponin peaking at 1.2, acute shortness of breath, and findings on stress test (previous ones were normal, no ischemia), discussed options of proceeding with left heart catheterization versus optimizing medical therapy. Patient verbalized understanding of stress test results and recommendation of proceeding with heart catheterization. She would like to proceed with procedure in the morning. Dr. Fletcher Anon aware.

## 2016-04-24 ENCOUNTER — Encounter
Admission: EM | Disposition: A | Discharge status: Skilled Nursing Facility | Payer: Self-pay | Source: Home / Self Care | Attending: Internal Medicine

## 2016-04-24 DIAGNOSIS — I509 Heart failure, unspecified: Secondary | ICD-10-CM | POA: Diagnosis not present

## 2016-04-24 DIAGNOSIS — I214 Non-ST elevation (NSTEMI) myocardial infarction: Secondary | ICD-10-CM | POA: Diagnosis not present

## 2016-04-24 DIAGNOSIS — R55 Syncope and collapse: Secondary | ICD-10-CM | POA: Diagnosis not present

## 2016-04-24 DIAGNOSIS — I2510 Atherosclerotic heart disease of native coronary artery without angina pectoris: Secondary | ICD-10-CM | POA: Diagnosis not present

## 2016-04-24 DIAGNOSIS — E039 Hypothyroidism, unspecified: Secondary | ICD-10-CM | POA: Diagnosis not present

## 2016-04-24 HISTORY — PX: CARDIAC CATHETERIZATION: SHX172

## 2016-04-24 LAB — NM MYOCAR MULTI W/SPECT W/WALL MOTION / EF
CHL CUP NUCLEAR SDS: 1
CHL CUP NUCLEAR SSS: 20
CHL CUP STRESS STAGE 2 GRADE: 0 %
CHL CUP STRESS STAGE 2 SPEED: 0 mph
CHL CUP STRESS STAGE 3 GRADE: 0 %
CHL CUP STRESS STAGE 3 HR: 102 {beats}/min
CHL CUP STRESS STAGE 3 SPEED: 0 mph
CHL CUP STRESS STAGE 4 SPEED: 0 mph
CHL CUP STRESS STAGE 5 GRADE: 0 %
CHL CUP STRESS STAGE 5 HR: 104 {beats}/min
CHL CUP STRESS STAGE 5 SPEED: 0 mph
CSEPHR: 80 %
CSEPPMHR: 74 %
Estimated workload: 1 METS
LV dias vol: 129 mL (ref 46–106)
LV sys vol: 77 mL
Peak HR: 102 {beats}/min
Rest HR: 100 {beats}/min
SRS: 17
Stage 1 Grade: 0 %
Stage 1 HR: 98 {beats}/min
Stage 1 Speed: 0 mph
Stage 2 HR: 98 {beats}/min
Stage 4 Grade: 0 %
Stage 4 HR: 109 {beats}/min
Stage 5 DBP: 64 mmHg
Stage 5 SBP: 137 mmHg
TID: 1.06

## 2016-04-24 SURGERY — LEFT HEART CATH AND CORONARY ANGIOGRAPHY
Anesthesia: Moderate Sedation

## 2016-04-24 MED ORDER — SODIUM CHLORIDE 0.9 % WEIGHT BASED INFUSION: 1.0000 mL/kg/h | INTRAVENOUS | Status: DC

## 2016-04-24 MED ORDER — SODIUM CHLORIDE 0.9% FLUSH: 3.0000 mL | INTRAVENOUS | Status: DC | PRN

## 2016-04-24 MED ORDER — SODIUM CHLORIDE 0.9 % WEIGHT BASED INFUSION
1.0000 mL/kg/h | INTRAVENOUS | Status: DC
  Administered 2016-04-24: 1 mL/kg/h via INTRAVENOUS

## 2016-04-24 MED ORDER — HEPARIN SODIUM (PORCINE) 1000 UNIT/ML IJ SOLN
INTRAMUSCULAR | Status: AC
  Filled 2016-04-24: qty 1

## 2016-04-24 MED ORDER — HEPARIN (PORCINE) IN NACL 2-0.9 UNIT/ML-% IJ SOLN
INTRAMUSCULAR | Status: AC
  Filled 2016-04-24: qty 500

## 2016-04-24 MED ORDER — SODIUM CHLORIDE 0.9% FLUSH: 3.0000 mL | Freq: Two times a day (BID) | INTRAVENOUS | Status: DC

## 2016-04-24 MED ORDER — SODIUM CHLORIDE 0.9 % IV SOLN: 250.0000 mL | INTRAVENOUS | Status: DC | PRN

## 2016-04-24 MED ORDER — CLOPIDOGREL BISULFATE 75 MG PO TABS
ORAL_TABLET | ORAL | Status: AC
  Filled 2016-04-24: qty 4

## 2016-04-24 MED ORDER — SODIUM CHLORIDE 0.9 % IV SOLN: INTRAVENOUS | Status: DC

## 2016-04-24 MED ORDER — VERAPAMIL HCL 2.5 MG/ML IV SOLN
INTRAVENOUS | Status: DC | PRN
  Administered 2016-04-24: 2.5 mg via INTRA_ARTERIAL

## 2016-04-24 MED ORDER — FENTANYL CITRATE (PF) 100 MCG/2ML IJ SOLN
INTRAMUSCULAR | Status: AC
  Filled 2016-04-24: qty 2

## 2016-04-24 MED ORDER — ENOXAPARIN SODIUM 40 MG/0.4ML ~~LOC~~ SOLN: 40.0000 mg | Freq: Every day | SUBCUTANEOUS | Status: DC

## 2016-04-24 MED ORDER — FENTANYL CITRATE (PF) 100 MCG/2ML IJ SOLN
INTRAMUSCULAR | Status: DC | PRN
  Administered 2016-04-24: 12.5 ug via INTRAVENOUS

## 2016-04-24 MED ORDER — CLOPIDOGREL BISULFATE 75 MG PO TABS
ORAL_TABLET | ORAL | Status: DC | PRN
  Administered 2016-04-24: 300 mg via ORAL

## 2016-04-24 MED ORDER — HEPARIN SODIUM (PORCINE) 1000 UNIT/ML IJ SOLN
INTRAMUSCULAR | Status: DC | PRN
  Administered 2016-04-24: 3500 [IU] via INTRAVENOUS

## 2016-04-24 MED ORDER — SODIUM CHLORIDE 0.9 % WEIGHT BASED INFUSION: 3.0000 mL/kg/h | INTRAVENOUS | Status: DC

## 2016-04-24 MED ORDER — VERAPAMIL HCL 2.5 MG/ML IV SOLN
INTRAVENOUS | Status: AC
  Filled 2016-04-24: qty 2

## 2016-04-24 MED ORDER — SODIUM CHLORIDE 0.9 % IV SOLN
INTRAVENOUS | Status: AC
  Administered 2016-04-24: 13:00:00 via INTRAVENOUS

## 2016-04-24 MED ORDER — MIDODRINE HCL 5 MG PO TABS
5.0000 mg | ORAL_TABLET | Freq: Two times a day (BID) | ORAL | Status: DC
  Administered 2016-04-24: 5 mg via ORAL

## 2016-04-24 MED ORDER — NITROGLYCERIN 5 MG/ML IV SOLN
INTRAVENOUS | Status: AC
  Filled 2016-04-24: qty 10

## 2016-04-24 MED ORDER — ASPIRIN 81 MG PO CHEW: 81.0000 mg | CHEWABLE_TABLET | ORAL | Status: DC

## 2016-04-24 MED ORDER — SODIUM CHLORIDE 0.9 % IV SOLN
INTRAVENOUS | Status: DC
Start: 2016-04-24 — End: 2016-04-25

## 2016-04-24 MED ORDER — MIDAZOLAM HCL 2 MG/2ML IJ SOLN
INTRAMUSCULAR | Status: DC | PRN
  Administered 2016-04-24: 0.5 mg via INTRAVENOUS

## 2016-04-24 MED ORDER — MIDAZOLAM HCL 2 MG/2ML IJ SOLN
INTRAMUSCULAR | Status: AC
  Filled 2016-04-24: qty 2

## 2016-04-24 MED ORDER — SODIUM CHLORIDE 0.9% FLUSH
3.0000 mL | Freq: Two times a day (BID) | INTRAVENOUS | Status: DC
  Administered 2016-04-24 (×2): 3 mL via INTRAVENOUS

## 2016-04-24 SURGICAL SUPPLY — 11 items
CATH INFINITI JR4 5F (CATHETERS) ×2 IMPLANT
CATH OPTITORQUE JACKY 4.0 5F (CATHETERS) ×2 IMPLANT
CATH VISTA GUIDE 6FR JR4 (CATHETERS) IMPLANT
DEVICE INFLAT 30 PLUS (MISCELLANEOUS) IMPLANT
DEVICE RAD TR BAND REGULAR (VASCULAR PRODUCTS) ×2 IMPLANT
GLIDESHEATH SLEND SS 6F .021 (SHEATH) ×2 IMPLANT
KIT MANI 3VAL PERCEP (MISCELLANEOUS) ×3 IMPLANT
PACK CARDIAC CATH (CUSTOM PROCEDURE TRAY) ×3 IMPLANT
WIRE HITORQ VERSACORE ST 145CM (WIRE) ×2 IMPLANT
WIRE RUNTHROUGH .014X180CM (WIRE) IMPLANT
WIRE SAFE-T 1.5MM-J .035X260CM (WIRE) ×2 IMPLANT

## 2016-04-24 NOTE — Progress Notes (Signed)
PT Cancellation Note  Patient Details Name: Erica [name] MRN: [id number] DOB: March 09, 1931   Cancelled Treatment:    Reason Eval/Treat Not Completed: Other (comment). Pt out of room for cardiac cath. Unavailable for treatment at this time. Will re-attempt, time permitting   Loann Chahal 04/24/2016, 9:47 AM  Greggory Stallion, PT, DPT [phone]

## 2016-04-24 NOTE — Progress Notes (Signed)
CSW updated Erica [name]- Admissions at [facility] and updated her that patient will have her procedure tomorrow. CSW will continue to follow and assist.   Ernest Pine, MSW, [place] Work Department [phone]

## 2016-04-24 NOTE — Progress Notes (Signed)
[place] at [place] NAME: Erica [name]    MR#:  [id number]  DATE OF BIRTH:  1931/03/01  SUBJECTIVE:  CHIEF COMPLAINT:   Chief Complaint  Patient presents with  . Shortness of Breath   - stress test was positive yesterday,  Reversible defect noted in the inferior and inferolateral regions. -For cardiac catheterization today. Patient is asymptomatic today.  REVIEW OF SYSTEMS:    Review of Systems  Constitutional: Negative for fever, chills and malaise/fatigue.  HENT: Negative for sore throat.   Eyes: Negative for blurred vision, double vision and pain.  Respiratory: Negative for cough, hemoptysis, shortness of breath and wheezing.   Cardiovascular: Negative for chest pain, palpitations, orthopnea and leg swelling.  Gastrointestinal: Negative for nausea, vomiting, abdominal pain, diarrhea and constipation.  Musculoskeletal: Negative for back pain and joint pain.  Skin: Negative for rash.  Neurological: Positive for dizziness. Negative for sensory change, speech change, focal weakness and headaches.    DRUG ALLERGIES:   Allergies  Allergen Reactions  . Ciprofloxacin Anaphylaxis and Hives  . Ace Inhibitors Other (See Comments)    Reaction:  Unknown   . Amlodipine-Atorvastatin Other (See Comments)    Reaction:  Unknown   . Atorvastatin Other (See Comments)    Muscle cramps  . Amoxicillin Itching, Rash and Other (See Comments)    Has patient had a PCN reaction causing immediate rash, facial/tongue/throat swelling, SOB or lightheadedness with hypotension: No Has patient had a PCN reaction causing severe rash involving mucus membranes or skin necrosis: No Has patient had a PCN reaction that required hospitalization No Has patient had a PCN reaction occurring within the last 10 years: Yes If all of the above answers are "NO", then may proceed with Cephalosporin use.  . Quinolones Itching and Rash    VITALS:  Blood pressure  134/67, pulse 88, temperature 97.3 F (36.3 C), temperature source Oral, resp. rate 17, height 5\' 2"  (1.575 m), weight 64.864 kg (143 lb), SpO2 93 %.  PHYSICAL EXAMINATION:   Physical Exam  GENERAL:  82 y.o.-year-old patient lying in the bed with no acute distress.  EYES: Pupils equal, round, reactive to light and accommodation. No scleral icterus. Extraocular muscles intact.  HEENT: Head atraumatic, normocephalic. Oropharynx and nasopharynx clear.  NECK:  Supple, no jugular venous distention. No thyroid enlargement, no tenderness.  LUNGS: Normal breath sounds bilaterally, no wheezing,rhonchi. No use of accessory muscles of respiration. Fine bibasilar crackles heard. CARDIOVASCULAR: S1, S2 normal. No rubs, or gallops. 2/6 systolic murmur is present ABDOMEN: Soft, nontender, nondistended. Bowel sounds present. No organomegaly or mass.  EXTREMITIES: No cyanosis, clubbing or edema b/l.    NEUROLOGIC: Cranial nerves II through XII are intact. No focal Motor or sensory deficits b/l.   PSYCHIATRIC: The patient is alert and oriented x 3.  SKIN: No obvious rash, lesion, or ulcer.   LABORATORY PANEL:   CBC  Recent Labs Lab 04/23/16 0211  WBC 6.9  HGB 10.0*  HCT 28.6*  PLT 364   ------------------------------------------------------------------------------------------------------------------ Chemistries   Recent Labs Lab 04/20/16 1219  04/23/16 0211  NA 136  < > 136  K 4.5  < > 4.0  CL 101  < > 105  CO2 27  < > 25  GLUCOSE 109*  < > 101*  BUN 15  < > 20  CREATININE 1.24*  < > 1.25*  CALCIUM 8.6*  < > 8.4*  MG 2.1  --   --  AST 24  --   --   ALT 30  --   --   ALKPHOS 167*  --   --   BILITOT 0.3  --   --   < > = values in this interval not displayed. ------------------------------------------------------------------------------------------------------------------  Cardiac Enzymes  Recent Labs Lab 04/21/16 1844  TROPONINI 0.85*    ------------------------------------------------------------------------------------------------------------------  RADIOLOGY:  Nm Myocar Multi W/spect W/wall Motion / Ef  04/23/2016   There was no ST segment deviation noted during stress.  No T wave inversion was noted during stress.  This is an intermediate risk study. Cannot rule out ischemia.  Nuclear stress EF: 35%. There is note of severe hypokinesis of inferior and inferolateral segments.  There is a medium defect of severe severity present in the basal inferior, basal inferolateral, mid inferior, mid inferolateral and apical inferior location. The defect is partially reversible. The perfusion defect is noted to be severe intensity at stress, moderate at rest.      ASSESSMENT AND PLAN:   Erica [name] is a 82 y.o. female with a known history of CAD status post stents, TIA/CVA in April 2017, combined ischemic cardiomyopathy with EF of 35% status post ICD and pacemaker, chronic interstitial lung disease, hypertension presents to the hospital from [facility] secondary to recurrent dizziness and syncopal episodes.  # NSTEMI -Cardiology consulted. No EKG changes and no chest pain. - but Myoview is positive yesterday with reversible changes in inferior and inferolateral leads - For cardiac catheterization today - Continue aspirin, Plavix and low-dose metoprolol, statin. -Appreciate cardiology consult  # Recovering pneumonia  finished Keflex and azithromycin   # Recurrent syncope, dizziness-  Secondary to orthostatic hypotension which is improved now. - improved with midodrine - prior had extensive workup done prior. MRI cannot be done due to her pacemaker. Carotid Dopplers did not show any atherosclerotic occlusive disease. Patient is on aspirin and Plavix. -Has a pacemaker.  - She does have chronic orthostatic hypotension which is improved since starting midodrine. -Physical therapy consulted. Daughter  considering long-term care.  # Chronic Systolic CHF- Last EF of [id number]. - on Lasix daily  # Interstitial lung disease currently on 2 L oxygen As needed. Stable. Outpatient pulmonary follow-up. Continue home inhalers.  # DVT Prophylaxis- lovenox   Possible discharge tomorrow after Myoview is negative   All the records are reviewed and case discussed with Care Management/Social Workerr. Management plans discussed with the patient, family and they are in agreement.  CODE STATUS: FULL CODE  DVT Prophylaxis: SCDs  TOTAL TIME TAKING CARE OF THIS PATIENT: 35 minutes.   POSSIBLE D/C  tomorrow, DEPENDING ON CLINICAL CONDITION.  Gladstone Lighter M.D on 04/24/2016 at 8:19 AM  Between 7am to 6pm - Pager - [phone]  After 6pm go to [email or web address] - password [email or web address] [facility]  Office  [phone]  CC: Primary care physician; Kirk Ruths., MD  Note: This dictation was prepared with Dragon dictation along with smaller phrase technology. Any transcriptional errors that result from this process are unintentional.

## 2016-04-24 NOTE — Interval H&P Note (Signed)
Cath Lab Visit (complete for each Cath Lab visit)  Clinical Evaluation Leading to the Procedure:   ACS: Yes.    Non-ACS:    Anginal Classification: CCS IV  Anti-ischemic medical therapy: Maximal Therapy (2 or more classes of medications)  Non-Invasive Test Results: Intermediate-risk stress test findings: cardiac mortality 1-3%/year  Prior CABG: No previous CABG      History and Physical Interval Note:  04/24/2016 9:36 AM  Erica [name]  has presented today for surgery, with the diagnosis of nstemi  The various methods of treatment have been discussed with the patient and family. After consideration of risks, benefits and other options for treatment, the patient has consented to  Procedure(s): Left Heart Cath and Coronary Angiography (N/A) as a surgical intervention .  The patient's history has been reviewed, patient examined, no change in status, stable for surgery.  I have reviewed the patient's chart and labs.  Questions were answered to the patient's satisfaction.     Kathlyn Sacramento

## 2016-04-24 NOTE — Progress Notes (Signed)
Called and updated daughter [name] on pt's status.

## 2016-04-24 NOTE — Progress Notes (Signed)
To cath lab via bed.

## 2016-04-24 NOTE — H&P (View-Only) (Signed)
Stress test was mildly positive. Findings explained to patient and daughter. In light of her troponin peaking at 1.2, acute shortness of breath, and findings on stress test (previous ones were normal, no ischemia), discussed options of proceeding with left heart catheterization versus optimizing medical therapy. Patient verbalized understanding of stress test results and recommendation of proceeding with heart catheterization. She would like to proceed with procedure in the morning. Dr. Fletcher Anon aware.

## 2016-04-24 NOTE — Progress Notes (Signed)
Daughter [name] updated on pt's procedure and plan of care.

## 2016-04-24 NOTE — Progress Notes (Signed)
ANTICOAGULATION CONSULT NOTE - Initial Consult  Pharmacy Consult for Lovenox Indication: VTE prophylaxis  Allergies  Allergen Reactions  . Ciprofloxacin Anaphylaxis and Hives  . Ace Inhibitors Other (See Comments)    Reaction:  Unknown   . Amlodipine-Atorvastatin Other (See Comments)    Reaction:  Unknown   . Atorvastatin Other (See Comments)    Muscle cramps  . Amoxicillin Itching, Rash and Other (See Comments)    Has patient had a PCN reaction causing immediate rash, facial/tongue/throat swelling, SOB or lightheadedness with hypotension: No Has patient had a PCN reaction causing severe rash involving mucus membranes or skin necrosis: No Has patient had a PCN reaction that required hospitalization No Has patient had a PCN reaction occurring within the last 10 years: Yes If all of the above answers are "NO", then may proceed with Cephalosporin use.  . Quinolones Itching and Rash    Patient Measurements: Height: 5\' 2"  (157.5 cm) (stated) Weight: 143 lb (64.864 kg) IBW/kg (Calculated) : 50.1   Vital Signs: Temp: 97.3 F (36.3 C) (06/08 0722) Temp Source: Oral (06/08 0722) BP: 134/67 mmHg (06/08 0722) Pulse Rate: 88 (06/08 0722)  Labs:  Recent Labs  04/21/16 1844 04/22/16 0049 04/22/16 1017 04/22/16 1630 04/23/16 0211  HGB  --  9.5*  --   --  10.0*  HCT  --  27.9*  --   --  28.6*  PLT  --  347  --   --  364  HEPARINUNFRC  --  0.30 <0.10* 0.28* 0.48  CREATININE  --   --   --   --  1.25*  TROPONINI 0.85*  --   --   --   --     Estimated Creatinine Clearance: 30.1 mL/min (by C-G formula based on Cr of 1.25).   Medical History: Past Medical History  Diagnosis Date  . Nonischemic cardiomyopathy ([place])     a. s/p Medtronic CRT-D, NYHA II-III at baseline; b. echo 2011: EF 43%, mildly dilated LA, PASP 57mm Hg; c. echo 3/16: EF 35-40%, GR1DD, mild to mod AI, mild MR, PASP 65 mmHg  . Coronary artery disease     a. s/p prior stent-RCA; b. cath 6/14: mild to mod prox to  mid LAD dz, mod prox to dRCA at 60% unchanged from prior cath; c. nuc 3/16: no sig ischemia, nl WM, EF 19%  . History of MI (myocardial infarction)   . Dyslipidemia   . Hypertension   . COPD (chronic obstructive pulmonary disease) ([place])   . Hypothyroidism   . Basal cell carcinoma 2011  . Respiratory infection   . Vitamin D deficiency   . Breast cancer ([place]) 30+ yrs. ago    right breast, radiation  . TIA (transient ischemic attack) 02/2016    a. negative head CT x 2, unable to have MRI brain given ICD; b. carotid doppler with 0-49% stenosis bilaterally with antegrade flow bilaterally   . Blind left eye   . Frequent falls   . LBBB (left bundle branch block)   . Orthostatic hypotension     Medications:  Scheduled:  . aspirin  81 mg Oral Daily  . [START ON 04/25/2016] aspirin  81 mg Oral Pre-Cath  . cholecalciferol  1,000 Units Oral Daily  . clopidogrel  75 mg Oral Daily  . docusate sodium  100 mg Oral BID  . enoxaparin (LOVENOX) injection  40 mg Subcutaneous QHS  . furosemide  20 mg Oral Daily  . levothyroxine  75 mcg Oral Q0600  .  midodrine  5 mg Oral TID WC  . mometasone-formoterol  2 puff Inhalation BID  . multivitamin with minerals  1 tablet Oral Daily  . pravastatin  10 mg Oral QHS  . sodium chloride flush  3 mL Intravenous Q12H  . sodium chloride flush  3 mL Intravenous Q12H  . sodium chloride flush  3 mL Intravenous Q12H   Infusions:  . [START ON 04/25/2016] sodium chloride    . [START ON 04/25/2016] sodium chloride     Followed by  . [START ON 04/25/2016] sodium chloride      Assessment: 82 y/o F admitted with orthostatic hypotension and NSTEMI to resume DVT prophylaxis this PM after cardiac cath.   Goal of Therapy:  Monitor platelets by anticoagulation protocol: Yes   Plan:  Will begin Lovenox 40 mg daily from this PM and follow renal function closely.   Ulice Dash D 04/24/2016,8:48 AM

## 2016-04-24 NOTE — Progress Notes (Signed)
Patient: Erica [name] / Admit Date: 04/20/2016 / Date of Encounter: 04/24/2016, 7:27 AM   Subjective: She is for cardiac cath this morning. Stress test 6/7 abnormal with intermediate risk. EF 35% with severe HK of inferior and inferolateral segments. Medium defect of severe severity present in the basal inferior, basal inferolateral, mid inferior, mid inferolateral, and apical inferior location. The defect is partially reversible. Defect is noted to be severe intensity at stress, and moderate at rest.   No further chest pain. SOB continues to improve.   Review of Systems: Review of Systems  Constitutional: Positive for malaise/fatigue. Negative for fever, chills, weight loss and diaphoresis.  HENT: Negative for congestion.   Eyes: Negative for discharge and redness.  Respiratory: Positive for cough. Negative for hemoptysis, sputum production, shortness of breath and wheezing.   Cardiovascular: Negative for chest pain, palpitations, orthopnea, claudication, leg swelling and PND.  Gastrointestinal: Negative for heartburn, nausea, vomiting, abdominal pain, blood in stool and melena.  Genitourinary: Negative for hematuria.  Musculoskeletal: Negative for falls.  Skin: Negative for rash.  Neurological: Negative for dizziness, sensory change, speech change, focal weakness, loss of consciousness and weakness.  Endo/Heme/Allergies: Does not bruise/bleed easily.  Psychiatric/Behavioral: The patient is not nervous/anxious.   All other systems reviewed and are negative.   Objective: Telemetry: paced, 92 bpm Physical Exam: Blood pressure 134/67, pulse 88, temperature 97.3 F (36.3 C), temperature source Oral, resp. rate 17, height 5\' 2"  (1.575 m), weight 143 lb (64.864 kg), SpO2 93 %. Body mass index is 26.15 kg/(m^2). General: Well developed, well nourished, in no acute distress. Head: Normocephalic, atraumatic, sclera non-icteric, no xanthomas, nares are without discharge. Neck: Negative for  carotid bruits. JVP not elevated. Lungs: Clear bilaterally to auscultation without wheezes, rales, or rhonchi. Breathing is unlabored. Heart: RRR S1 S2 without murmurs, rubs, or gallops.  Abdomen: Soft, non-tender, non-distended with normoactive bowel sounds. No rebound/guarding. Extremities: No clubbing or cyanosis. No edema. Distal pedal pulses are 2+ and equal bilaterally. Neuro: Alert and oriented X 3. Moves all extremities spontaneously. Psych:  Responds to questions appropriately with a normal affect.   Intake/Output Summary (Last 24 hours) at 04/24/16 0727 Last data filed at 04/24/16 0521  Gross per 24 hour  Intake    243 ml  Output    650 ml  Net   -407 ml    Inpatient Medications:  . aspirin  81 mg Oral Daily  . cholecalciferol  1,000 Units Oral Daily  . clopidogrel  75 mg Oral Daily  . docusate sodium  100 mg Oral BID  . furosemide  20 mg Oral Daily  . levothyroxine  75 mcg Oral Q0600  . midodrine  5 mg Oral TID WC  . mometasone-formoterol  2 puff Inhalation BID  . multivitamin with minerals  1 tablet Oral Daily  . pravastatin  10 mg Oral QHS  . sodium chloride flush  3 mL Intravenous Q12H  . sodium chloride flush  3 mL Intravenous Q12H   Infusions:  . [START ON 04/25/2016] sodium chloride      Labs:  Recent Labs  04/23/16 0211  NA 136  K 4.0  CL 105  CO2 25  GLUCOSE 101*  BUN 20  CREATININE 1.25*  CALCIUM 8.4*   No results for input(s): AST, ALT, ALKPHOS, BILITOT, PROT, ALBUMIN in the last 72 hours.  Recent Labs  04/22/16 0049 04/23/16 0211  WBC 6.7 6.9  HGB 9.5* 10.0*  HCT 27.9* 28.6*  MCV 89.1 88.9  PLT 347 364    Recent Labs  04/21/16 1844  TROPONINI 0.85*   Invalid input(s): POCBNP No results for input(s): HGBA1C in the last 72 hours.   Weights: Filed Weights   04/20/16 1108 04/20/16 1715 04/24/16 0448  Weight: 146 lb (66.225 kg) 141 lb 3.2 oz (64.048 kg) 143 lb (64.864 kg)     Radiology/Studies:  Dg Chest 2 View  04/20/2016   CLINICAL DATA:  Low oxygen saturation.  Shortness of breath. EXAM: CHEST  2 VIEW COMPARISON:  04/15/2016 FINDINGS: Left chest wall ICD is noted with leads in the right atrial appendage, coronary sinus and right ventricle. Moderate cardiac enlargement. There is a small left pleural effusion. Mild diffuse interstitial edema is superimposed upon chronic lung disease. IMPRESSION: 1. Cardiac enlargement, aortic atherosclerosis and mild CHF 2. Chronic lung disease. Electronically Signed   By: Kerby Moors M.D.   On: 04/20/2016 11:46   Dg Chest 2 View  04/15/2016  CLINICAL DATA:  82 year old with current history of COPD, coronary artery disease, CHF and personal history of right breast cancer, presenting with chronic cough. Former smoker. Followup left lower lobe pneumonia. EXAM: CHEST  2 VIEW COMPARISON:  04/10/2016 and earlier. FINDINGS: AP sitting erect and lateral images were obtained. Suboptimal inspiration. Patchy airspace consolidation in the left lower lobe, unchanged since the examination 5 days ago. Small left pleural effusion. Mild atelectasis in the right lower lobe and right middle lobe. No new pulmonary parenchymal abnormalities. Stable mild moderate cardiomegaly. Biventricular pacing defibrillator unchanged and appears intact. Right coronary artery stent. IMPRESSION: Stable left lower lobe pneumonia and associated small left pleural effusion. Mild atelectasis in the right middle lobe and right lower lobe related to suboptimal inspiration. Stable cardiomegaly without pulmonary edema. Electronically Signed   By: Evangeline Dakin M.D.   On: 04/15/2016 10:41   Ct Angio Chest Pe W/cm &/or Wo Cm  04/20/2016  CLINICAL DATA:  Shortness of breath and tachycardia. History of breast carcinoma EXAM: CT ANGIOGRAPHY CHEST WITH CONTRAST TECHNIQUE: Multidetector CT imaging of the chest was performed using the standard protocol during bolus administration of intravenous contrast. Multiplanar CT image reconstructions  and MIPs were obtained to evaluate the vascular anatomy. CONTRAST:  70 mL Isovue 370 nonionic COMPARISON:  Chest radiograph April 20, 2016 ; chest CT October 19, 2009 FINDINGS: Mediastinum/Lymph Nodes: There is no demonstrable pulmonary embolus. There is no thoracic aortic aneurysm or dissection. The visualize great vessels show moderate calcification at their respective origins. There is atherosclerotic calcification throughout the aorta. There is extensive coronary artery calcification. Pericardium is not appreciably thickened. Heart is mildly enlarged. Pacemaker leads are attached to the right atrium and right ventricle. The left lobe of the thyroid is diffusely enlarged and inhomogeneous in attenuation, a finding not present on prior study. There are scattered small mediastinal lymph nodes. There is no frank adenopathy on this study. Lungs/Pleura: There is extensive airspace consolidation in the posterior segment left upper lobe. A lesser degree of infiltrate is noted in the posterior segment of the right upper lobe. Several tiny interstitial fibrosis throughout the lungs bilaterally, primarily in the lower lobes but also to some extent in the upper lobes, slightly more severe on the right than on the left. There is probable mild interstitial edema superimposed. There is a small left pleural effusion. Upper abdomen: Visualized upper abdominal structures appear unremarkable except for atherosclerotic calcification in the abdominal aorta. Musculoskeletal: There are no blastic or lytic bone lesions. Review of the MIP images confirms the  above findings. IMPRESSION: No demonstrable pulmonary embolus. Airspace consolidation in the posterior segments of both upper lobes, more severe on the left than on the right. These areas rib consistent with pneumonia. Suspect mild interstitial edema. There is a small left effusion. There is a degree of underlying parenchymal fibrosis in both upper and lower lobes with relative lower  lobe predominance. These changes progressed since the 2010 study. Cardiomegaly. Pacemaker leads attached to right atrium and right ventricle. The left lobe of the thyroid is diffusely enlarged and irregular and appearance. This finding represents a change compared to prior study. Nonemergent thyroid ultrasound is advised to assess for possible neoplasm arising from the left lobe of the thyroid. There are small lymph nodes present without frank adenopathy by size criteria. There is extensive atherosclerotic calcification. Extensive coronary artery calcification noted. Electronically Signed   By: Lowella Grip III M.D.   On: 04/20/2016 14:16   Nm Myocar Multi W/spect W/wall Motion / Ef  04/23/2016   There was no ST segment deviation noted during stress.  No T wave inversion was noted during stress.  This is an intermediate risk study. Cannot rule out ischemia.  Nuclear stress EF: 35%. There is note of severe hypokinesis of inferior and inferolateral segments.  There is a medium defect of severe severity present in the basal inferior, basal inferolateral, mid inferior, mid inferolateral and apical inferior location. The defect is partially reversible. The perfusion defect is noted to be severe intensity at stress, moderate at rest.    Dg Chest [facility]  04/10/2016  CLINICAL DATA:  Acute onset of generalized weakness, chest pain, fever and dysuria. Initial encounter. EXAM: PORTABLE CHEST 1 VIEW COMPARISON:  Chest radiograph performed 03/12/2016 FINDINGS: The lungs are hypoexpanded. Patchy left-sided airspace opacity could reflect mild pneumonia. There is no evidence of pleural effusion or pneumothorax. The cardiomediastinal silhouette is borderline enlarged. A pacemaker/AICD is noted overlying the left chest wall, with leads ending overlying the right atrium, right ventricle and coronary sinus. No acute osseous abnormalities are seen. IMPRESSION: Lungs hypoexpanded. Patchy left-sided airspace opacity  could reflect mild pneumonia, given the patient's symptoms. Borderline cardiomegaly. Electronically Signed   By: Garald Balding M.D.   On: 04/10/2016 19:35     Assessment and Plan  Principal Problem:   COPD (chronic obstructive pulmonary disease) (HCC) Active Problems:   Orthostatic hypotension   NSTEMI (non-ST elevated myocardial infarction) (HCC)   Frequent falls   PNA (pneumonia)   Dizziness   Chronic systolic congestive heart failure ([place])    1. Elevated troponin/CAD s/p prior stenting as above: -Had an episode of chest pain 2 days prior to her admission that lasted for less than 5 minutes, none since and no associated symptoms -Status post heparin gtt for 48 hours total. Discontinued 6/7 -Abnormal nuclear stress test 6/7 -She is for cardiac cath this afternoon with Dr. Fletcher Anon, MD -Recent echo 02/2016 -Troponin peak of 1.2, possible supply demand ischemia in the setting of her CHF/hypotension -Risks and benefits of cardiac catheterization have been discussed with the patient including risks of bleeding, bruising, infection, kidney damage, stroke, heart attack, and death. The patient understands these risks and is willing to proceed with the procedure. All questions have been answered and concerns listened to  2. Dizziness/pre-syncope/syncope/orthostatic hypotension: -Found to be orthostatic upon admission -Status post NS bolus -On midodrine as of April admission, though she was not taking this at home and this was not continued upon admission -Continue midodrine, titrate as needed -BP  stable -PT to see  3. Chronic combined CHF s/p ICD: -She does appear to be volume overloaded at this time -Continue Lasix -Not on BB upon admission, patient's choice   4. ILD: -Per IM  5. PNA: -On ABX per IM  6. CKD stage II-III: -Stable -Per IM  7. HLD: -Statin   Signed, Christell Faith, PA-C [facility] Pager: [phone] 04/24/2016, 7:27 AM

## 2016-04-25 ENCOUNTER — Encounter
Admission: EM | Disposition: A | Discharge status: Skilled Nursing Facility | Payer: Self-pay | Source: Home / Self Care | Attending: Internal Medicine

## 2016-04-25 ENCOUNTER — Encounter: Payer: Self-pay | Admitting: Cardiovascular Disease

## 2016-04-25 DIAGNOSIS — E039 Hypothyroidism, unspecified: Secondary | ICD-10-CM | POA: Diagnosis not present

## 2016-04-25 DIAGNOSIS — I509 Heart failure, unspecified: Secondary | ICD-10-CM | POA: Diagnosis not present

## 2016-04-25 DIAGNOSIS — I2510 Atherosclerotic heart disease of native coronary artery without angina pectoris: Secondary | ICD-10-CM | POA: Diagnosis not present

## 2016-04-25 DIAGNOSIS — R55 Syncope and collapse: Secondary | ICD-10-CM | POA: Diagnosis not present

## 2016-04-25 DIAGNOSIS — I214 Non-ST elevation (NSTEMI) myocardial infarction: Secondary | ICD-10-CM | POA: Diagnosis not present

## 2016-04-25 HISTORY — PX: CARDIAC CATHETERIZATION: SHX172

## 2016-04-25 LAB — CREATININE, SERUM
CREATININE: 1.22 mg/dL — AB (ref 0.44–1.00)
GFR calc Af Amer: 46 mL/min — ABNORMAL LOW (ref >60)
GFR calc non Af Amer: 40 mL/min — ABNORMAL LOW (ref >60)

## 2016-04-25 LAB — GLUCOSE, CAPILLARY: GLUCOSE-CAPILLARY: 76 mg/dL (ref 65–99)

## 2016-04-25 SURGERY — CORONARY STENT INTERVENTION
Anesthesia: Moderate Sedation

## 2016-04-25 MED ORDER — NITROGLYCERIN 5 MG/ML IV SOLN
INTRAVENOUS | Status: AC
  Filled 2016-04-25: qty 10

## 2016-04-25 MED ORDER — NITROGLYCERIN 1 MG/10 ML FOR IR/CATH LAB
INTRA_ARTERIAL | Status: DC | PRN
  Administered 2016-04-25: 200 ug via INTRACORONARY

## 2016-04-25 MED ORDER — METOPROLOL TARTRATE 5 MG/5ML IV SOLN
INTRAVENOUS | Status: AC
  Filled 2016-04-25: qty 5

## 2016-04-25 MED ORDER — HEPARIN (PORCINE) IN NACL 2-0.9 UNIT/ML-% IJ SOLN
INTRAMUSCULAR | Status: AC
  Filled 2016-04-25: qty 1000

## 2016-04-25 MED ORDER — ASPIRIN 81 MG PO CHEW
CHEWABLE_TABLET | ORAL | Status: AC
  Filled 2016-04-25: qty 1

## 2016-04-25 MED ORDER — CLOPIDOGREL BISULFATE 75 MG PO TABS
ORAL_TABLET | ORAL | Status: AC
  Filled 2016-04-25: qty 1

## 2016-04-25 MED ORDER — FENTANYL CITRATE (PF) 100 MCG/2ML IJ SOLN
INTRAMUSCULAR | Status: AC
  Filled 2016-04-25: qty 2

## 2016-04-25 MED ORDER — SODIUM CHLORIDE 0.9 % IV SOLN: INTRAVENOUS | Status: AC

## 2016-04-25 MED ORDER — ASPIRIN 81 MG PO CHEW
CHEWABLE_TABLET | ORAL | Status: DC | PRN
  Administered 2016-04-25: 81 mg via ORAL

## 2016-04-25 MED ORDER — MIDAZOLAM HCL 2 MG/2ML IJ SOLN
INTRAMUSCULAR | Status: DC | PRN
  Administered 2016-04-25 (×2): 0.5 mg via INTRAVENOUS

## 2016-04-25 MED ORDER — BIVALIRUDIN BOLUS VIA INFUSION - CUPID
INTRAVENOUS | Status: DC | PRN
  Administered 2016-04-25: 47.475 mg via INTRAVENOUS

## 2016-04-25 MED ORDER — IOPAMIDOL (ISOVUE-300) INJECTION 61%
INTRAVENOUS | Status: DC | PRN
  Administered 2016-04-25: 100 mL via INTRA_ARTERIAL

## 2016-04-25 MED ORDER — MIDAZOLAM HCL 2 MG/2ML IJ SOLN
INTRAMUSCULAR | Status: AC
  Filled 2016-04-25: qty 2

## 2016-04-25 MED ORDER — FENTANYL CITRATE (PF) 100 MCG/2ML IJ SOLN
INTRAMUSCULAR | Status: DC | PRN
  Administered 2016-04-25: 25 ug via INTRAVENOUS

## 2016-04-25 MED ORDER — METOPROLOL TARTRATE 5 MG/5ML IV SOLN
INTRAVENOUS | Status: DC | PRN
  Administered 2016-04-25: 2.5 mg via INTRAVENOUS

## 2016-04-25 MED ORDER — CLOPIDOGREL BISULFATE 75 MG PO TABS
ORAL_TABLET | ORAL | Status: DC | PRN
  Administered 2016-04-25: 75 mg via ORAL

## 2016-04-25 MED ORDER — BIVALIRUDIN 250 MG IV SOLR
INTRAVENOUS | Status: AC
  Filled 2016-04-25: qty 250

## 2016-04-25 MED ORDER — SODIUM CHLORIDE 0.9 % IV SOLN
250.0000 mg | INTRAVENOUS | Status: DC | PRN
  Administered 2016-04-25: 1.75 mg/kg/h via INTRAVENOUS

## 2016-04-25 SURGICAL SUPPLY — 17 items
BALLN TREK RX 2.5X12 (BALLOONS) ×3
BALLN ~~LOC~~ TREK RX 3.0X12 (BALLOONS) ×3
BALLOON TREK RX 2.5X12 (BALLOONS) IMPLANT
BALLOON ~~LOC~~ TREK RX 3.0X12 (BALLOONS) IMPLANT
CATH VISTA GUIDE 6FR JR4 (CATHETERS) ×2 IMPLANT
DEVICE INFLAT 30 PLUS (MISCELLANEOUS) ×2 IMPLANT
KIT MANI 3VAL PERCEP (MISCELLANEOUS) ×3 IMPLANT
NDL PERC 18GX7CM (NEEDLE) IMPLANT
NEEDLE PERC 18GX7CM (NEEDLE) ×3 IMPLANT
PACK CARDIAC CATH (CUSTOM PROCEDURE TRAY) ×3 IMPLANT
SET INTRO CAPELLA COAXIAL (SET/KITS/TRAYS/PACK) ×2 IMPLANT
SHEATH AVANTI 6FR X 11CM (SHEATH) ×2 IMPLANT
STENT RESOLUTE INTEG 3.0X18 (Permanent Stent) ×2 IMPLANT
STENT XIENCE ALPINE RX 2.75X15 (Permanent Stent) ×2 IMPLANT
WIRE EMERALD 3MM-J .035X150CM (WIRE) ×2 IMPLANT
WIRE HITORQ VERSACORE ST 145CM (WIRE) ×2 IMPLANT
WIRE RUNTHROUGH .014X180CM (WIRE) ×2 IMPLANT

## 2016-04-25 NOTE — Progress Notes (Signed)
[facility] [place] NAME: Erica [name]    MR#:  [id number]  DATE OF BIRTH:  1931/08/21  SUBJECTIVE:  CHIEF COMPLAINT:   Chief Complaint  Patient presents with  . Shortness of Breath   - cardiac cath and RCA stent placement - no other complaints, right groin bleeding and has angiostat placed.  REVIEW OF SYSTEMS:    Review of Systems  Constitutional: Negative for fever, chills and malaise/fatigue.  HENT: Negative for sore throat.   Eyes: Negative for blurred vision, double vision and pain.  Respiratory: Negative for cough, hemoptysis, shortness of breath and wheezing.   Cardiovascular: Negative for chest pain, palpitations, orthopnea and leg swelling.  Gastrointestinal: Negative for nausea, vomiting, abdominal pain, diarrhea and constipation.  Musculoskeletal: Negative for back pain and joint pain.  Skin: Negative for rash.  Neurological: Negative for dizziness, sensory change, speech change, focal weakness and headaches.    DRUG ALLERGIES:   Allergies  Allergen Reactions  . Ciprofloxacin Anaphylaxis and Hives  . Ace Inhibitors Other (See Comments)    Reaction:  Unknown   . Amlodipine-Atorvastatin Other (See Comments)    Reaction:  Unknown   . Atorvastatin Other (See Comments)    Muscle cramps  . Amoxicillin Itching, Rash and Other (See Comments)    Has patient had a PCN reaction causing immediate rash, facial/tongue/throat swelling, SOB or lightheadedness with hypotension: No Has patient had a PCN reaction causing severe rash involving mucus membranes or skin necrosis: No Has patient had a PCN reaction that required hospitalization No Has patient had a PCN reaction occurring within the last 10 years: Yes If all of the above answers are "NO", then may proceed with Cephalosporin use.  . Quinolones Itching and Rash    VITALS:  Blood pressure 144/72, pulse 93, temperature 98.6 F (37 C), temperature source Oral, resp.  rate 17, height 5\' 2"  (1.575 m), weight 63.277 kg (139 lb 8 oz), SpO2 90 %.  PHYSICAL EXAMINATION:   Physical Exam  GENERAL:  82 y.o.-year-old patient lying in the bed with no acute distress.  EYES: Pupils equal, round, reactive to light and accommodation. No scleral icterus. Extraocular muscles intact.  HEENT: Head atraumatic, normocephalic. Oropharynx and nasopharynx clear.  NECK:  Supple, no jugular venous distention. No thyroid enlargement, no tenderness.  LUNGS: Normal breath sounds bilaterally, no wheezing,rhonchi. No use of accessory muscles of respiration. Fine bibasilar crackles heard. CARDIOVASCULAR: S1, S2 normal. No rubs, or gallops. 2/6 systolic murmur is present ABDOMEN: Soft, nontender, nondistended. Bowel sounds present. No organomegaly or mass.  EXTREMITIES: No cyanosis, clubbing or edema b/l.    NEUROLOGIC: Cranial nerves II through XII are intact. No focal Motor or sensory deficits b/l.   PSYCHIATRIC: The patient is alert and oriented x 3.  SKIN: No obvious rash, lesion, or ulcer.   LABORATORY PANEL:   CBC  Recent Labs Lab 04/23/16 0211  WBC 6.9  HGB 10.0*  HCT 28.6*  PLT 364   ------------------------------------------------------------------------------------------------------------------ Chemistries   Recent Labs Lab 04/20/16 1219  04/23/16 0211 04/25/16 0436  NA 136  < > 136  --   K 4.5  < > 4.0  --   CL 101  < > 105  --   CO2 27  < > 25  --   GLUCOSE 109*  < > 101*  --   BUN 15  < > 20  --   CREATININE 1.24*  < > 1.25* 1.22*  CALCIUM  8.6*  < > 8.4*  --   MG 2.1  --   --   --   AST 24  --   --   --   ALT 30  --   --   --   ALKPHOS 167*  --   --   --   BILITOT 0.3  --   --   --   < > = values in this interval not displayed. ------------------------------------------------------------------------------------------------------------------  Cardiac Enzymes  Recent Labs Lab 04/21/16 1844  TROPONINI 0.85*    ------------------------------------------------------------------------------------------------------------------  RADIOLOGY:  No results found.   ASSESSMENT AND PLAN:   Erica [name] is a 82 y.o. female with a known history of CAD status post stents, TIA/CVA in April 2017, combined ischemic cardiomyopathy with EF of 35% status post ICD and pacemaker, chronic interstitial lung disease, hypertension presents to the hospital from [facility] secondary to recurrent dizziness and syncopal episodes.  # NSTEMI -Cardiology consulted. No EKG changes and no chest pain. - Myoview is positive with reversible changes in inferior and inferolateral leads - s/p cardiac cath and RCA stent- continue dual antiplatelet therapy, appreciate cards consult  - Continue aspirin, Plavix and low-dose metoprolol, statin.  # Recovering pneumonia  finished Keflex and azithromycin   # Recurrent syncope, dizziness-  Secondary to orthostatic hypotension which is improved now with midodrine - as BP is high- midodrine stopped by cardiology - prior extensive workup done prior. MRI cannot be done due to her pacemaker. Carotid Dopplers did not show any atherosclerotic occlusive disease. Patient is on aspirin and Plavix. -Has a pacemaker.  -Physical therapy consulted. Daughter considering long-term care.  # Chronic Systolic CHF- Last EF of [id number]. - on Lasix daily  # Interstitial lung disease currently on 2 L oxygen As needed. Stable. Outpatient pulmonary follow-up. Continue home inhalers.  # DVT Prophylaxis- lovenox - discontinued  As bleeding after cath   Possible discharge tomorrow    All the records are reviewed and case discussed with Care Management/Social Workerr. Management plans discussed with the patient, family and they are in agreement.  CODE STATUS: FULL CODE  DVT Prophylaxis: SCDs  TOTAL TIME TAKING CARE OF THIS PATIENT: 35 minutes.   POSSIBLE D/C  tomorrow, DEPENDING ON  CLINICAL CONDITION.  Gladstone Lighter M.D on 04/25/2016 at 3:05 PM  Between 7am to 6pm - Pager - [phone]  After 6pm go to [email or web address] - password [email or web address] [facility]  Office  [phone]  CC: Primary care physician; Kirk Ruths., MD  Note: This dictation was prepared with Dragon dictation along with smaller phrase technology. Any transcriptional errors that result from this process are unintentional.

## 2016-04-25 NOTE — Progress Notes (Signed)
D/w Dr. Fletcher Anon. S/p DES palcement to RCA. Dual antiplatelet therapy for at least a year. Cont medical therapy. Pt to ffup with primary cardiologist, Dr. Rockey Situ.  Midodrine was stopped due to significant BP elevation.

## 2016-04-25 NOTE — Discharge Summary (Signed)
[facility] NAME: Erica [name]    MR#:  [id number]  DATE OF BIRTH:  [birth year]  DATE OF ADMISSION:  04/20/2016 ADMITTING PHYSICIAN: Gladstone Lighter, MD  DATE OF DISCHARGE:  04/26/16  PRIMARY CARE PHYSICIAN: Kirk Ruths., MD    ADMISSION DIAGNOSIS:  Shortness of breath [R06.02] Thyroid nodule [E04.1] Community acquired pneumonia [J18.9] NSTEMI (non-ST elevated myocardial infarction) ([facility]) [I21.4] Near syncope [R55]  DISCHARGE DIAGNOSIS:  Principal Problem:   COPD (chronic obstructive pulmonary disease) (HCC) Active Problems:   Orthostatic hypotension   Frequent falls   NSTEMI (non-ST elevated myocardial infarction) (HCC)   PNA (pneumonia)   Dizziness   Chronic systolic congestive heart failure ([place])   SECONDARY DIAGNOSIS:   Past Medical History  Diagnosis Date  . Nonischemic cardiomyopathy ([facility])     a. s/p Medtronic CRT-D, NYHA II-III at baseline; b. echo 2011: EF 43%, mildly dilated LA, PASP 61mm Hg; c. echo 3/16: EF 35-40%, GR1DD, mild to mod AI, mild MR, PASP 65 mmHg  . Coronary artery disease     a. s/p prior stent-RCA; b. cath 6/14: mild to mod prox to mid LAD dz, mod prox to dRCA at 60% unchanged from prior cath; c. nuc 3/16: no sig ischemia, nl WM, EF 19%  . History of MI (myocardial infarction)   . Dyslipidemia   . Hypertension   . COPD (chronic obstructive pulmonary disease) ([facility])   . Hypothyroidism   . Basal cell carcinoma 2011  . Respiratory infection   . Vitamin D deficiency   . Breast cancer ([place]) 30+ yrs. ago    right breast, radiation  . TIA (transient ischemic attack) 02/2016    a. negative head CT x 2, unable to have MRI brain given ICD; b. carotid doppler with 0-49% stenosis bilaterally with antegrade flow bilaterally   . Blind left eye   . Frequent falls   . LBBB (left bundle branch block)   . Orthostatic hypotension     HOSPITAL COURSE:   Erica [name] is a 82 y.o.  female with a known history of CAD status post stents, TIA/CVA in April 2017, combined ischemic cardiomyopathy with EF of 35% status post ICD and pacemaker, chronic interstitial lung disease, hypertension presents to the hospital from [facility] secondary to recurrent dizziness and syncopal episodes.  # NSTEMI -Cardiology consulted. No EKG changes and no chest pain. - Myoview is positive with reversible changes in inferior and inferolateral leads - s/p cardiac cath and RCA stent- continue dual antiplatelet therapy, appreciate cards consult  - Continue aspirin, Plavix and low-dose metoprolol, statin.  # Recovering pneumonia finished Keflex and azithromycin   # Recurrent syncope, dizziness- Secondary to orthostatic hypotension which is improved now with midodrine - as BP is high- midodrine stopped by cardiology - prior extensive workup done prior. MRI cannot be done due to her pacemaker. Carotid Dopplers did not show any atherosclerotic occlusive disease. Patient is on aspirin and Plavix. -Has a pacemaker.  -Physical therapy consulted. Daughter considering long-term care.  # Chronic Systolic CHF- Last EF of [id number]. - on Lasix daily  # Interstitial lung disease currently on 2 L oxygen As needed. Stable. Outpatient pulmonary follow-up. Continue home inhalers.   Possible discharge tomorrow   DISCHARGE CONDITIONS:   Stable  CONSULTS OBTAINED:  Treatment Team:  Wellington Hampshire, MD  DRUG ALLERGIES:   Allergies  Allergen Reactions  . Ciprofloxacin Anaphylaxis and Hives  . Ace Inhibitors Other (  See Comments)    Reaction:  Unknown   . Amlodipine-Atorvastatin Other (See Comments)    Reaction:  Unknown   . Atorvastatin Other (See Comments)    Muscle cramps  . Amoxicillin Itching, Rash and Other (See Comments)    Has patient had a PCN reaction causing immediate rash, facial/tongue/throat swelling, SOB or lightheadedness with hypotension: No Has patient had a  PCN reaction causing severe rash involving mucus membranes or skin necrosis: No Has patient had a PCN reaction that required hospitalization No Has patient had a PCN reaction occurring within the last 10 years: Yes If all of the above answers are "NO", then may proceed with Cephalosporin use.  . Quinolones Itching and Rash    DISCHARGE MEDICATIONS:   Current Discharge Medication List    CONTINUE these medications which have CHANGED   Details  chlorpheniramine-HYDROcodone (TUSSIONEX PENNKINETIC ER) 10-8 MG/5ML SUER Take 5 mLs by mouth every 12 (twelve) hours as needed for cough. Qty: 115 mL, Refills: 0    furosemide (LASIX) 20 MG tablet Take 1 tablet (20 mg total) by mouth daily. Qty: 30 tablet, Refills: 0    LORazepam (ATIVAN) 1 MG tablet Take 1 tablet (1 mg total) by mouth at bedtime as needed for anxiety or sleep. Qty: 15 tablet, Refills: 0      CONTINUE these medications which have NOT CHANGED   Details  acetaminophen (TYLENOL) 325 MG tablet Take 325 mg by mouth every 6 (six) hours as needed.    aspirin 81 MG chewable tablet Chew 1 tablet (81 mg total) by mouth daily. Qty: 30 tablet, Refills: 2    Cholecalciferol 2000 units CAPS Take 1 capsule by mouth daily.    clopidogrel (PLAVIX) 75 MG tablet Take 1 tablet (75 mg total) by mouth daily. Qty: 30 tablet, Refills: 2    Fluticasone-Salmeterol (ADVAIR DISKUS) 250-50 MCG/DOSE AEPB Inhale 1 puff into the lungs 2 (two) times daily. Qty: 60 each, Refills: 1    levothyroxine (SYNTHROID, LEVOTHROID) 75 MCG tablet Take 75 mcg by mouth daily before breakfast.     Multiple Vitamins-Minerals (MULTIVITAMIN GUMMIES ADULT PO) Take 1 each by mouth daily at 12 noon.     pravastatin (PRAVACHOL) 10 MG tablet Take 10 mg by mouth at bedtime.  Refills: 0      STOP taking these medications     azithromycin (ZITHROMAX) 250 MG tablet      cephALEXin (KEFLEX) 500 MG capsule          DISCHARGE INSTRUCTIONS:   1. PCP follow up in 1-2  weeks 2. Cardiology follow up in 2 weeks   If you experience worsening of your admission symptoms, develop shortness of breath, life threatening emergency, suicidal or homicidal thoughts you must seek medical attention immediately by calling 911 or calling your MD immediately  if symptoms less severe.  You Must read complete instructions/literature along with all the possible adverse reactions/side effects for all the Medicines you take and that have been prescribed to you. Take any new Medicines after you have completely understood and accept all the possible adverse reactions/side effects.   Please note  You were cared for by a hospitalist during your hospital stay. If you have any questions about your discharge medications or the care you received while you were in the hospital after you are discharged, you can call the unit and asked to speak with the hospitalist on call if the hospitalist that took care of you is not available. Once you are discharged, your  primary care physician will handle any further medical issues. Please note that NO REFILLS for any discharge medications will be authorized once you are discharged, as it is imperative that you return to your primary care physician (or establish a relationship with a primary care physician if you do not have one) for your aftercare needs so that they can reassess your need for medications and monitor your lab values.    Today   CHIEF COMPLAINT:   Chief Complaint  Patient presents with  . Shortness of Breath    VITAL SIGNS:  Blood pressure 144/72, pulse 93, temperature 98.6 F (37 C), temperature source Oral, resp. rate 17, height 5\' 2"  (1.575 m), weight 63.277 kg (139 lb 8 oz), SpO2 90 %.  I/O:   Intake/Output Summary (Last 24 hours) at 04/25/16 1513 Last data filed at 04/25/16 1500  Gross per 24 hour  Intake 1471.38 ml  Output    900 ml  Net 571.38 ml    PHYSICAL EXAMINATION:   Physical Exam  GENERAL: 82  y.o.-year-old patient lying in the bed with no acute distress.  EYES: Pupils equal, round, reactive to light and accommodation. No scleral icterus. Extraocular muscles intact.  HEENT: Head atraumatic, normocephalic. Oropharynx and nasopharynx clear.  NECK: Supple, no jugular venous distention. No thyroid enlargement, no tenderness.  LUNGS: Normal breath sounds bilaterally, no wheezing,rhonchi. No use of accessory muscles of respiration. Fine bibasilar crackles heard. CARDIOVASCULAR: S1, S2 normal. No rubs, or gallops. 2/6 systolic murmur is present ABDOMEN: Soft, nontender, nondistended. Bowel sounds present. No organomegaly or mass.  EXTREMITIES: No cyanosis, clubbing or edema b/l.  NEUROLOGIC: Cranial nerves II through XII are intact. No focal Motor or sensory deficits b/l.  PSYCHIATRIC: The patient is alert and oriented x 3.  SKIN: No obvious rash, lesion, or ulcer.   DATA REVIEW:   CBC  Recent Labs Lab 04/23/16 0211  WBC 6.9  HGB 10.0*  HCT 28.6*  PLT 364    Chemistries   Recent Labs Lab 04/20/16 1219  04/23/16 0211 04/25/16 0436  NA 136  < > 136  --   K 4.5  < > 4.0  --   CL 101  < > 105  --   CO2 27  < > 25  --   GLUCOSE 109*  < > 101*  --   BUN 15  < > 20  --   CREATININE 1.24*  < > 1.25* 1.22*  CALCIUM 8.6*  < > 8.4*  --   MG 2.1  --   --   --   AST 24  --   --   --   ALT 30  --   --   --   ALKPHOS 167*  --   --   --   BILITOT 0.3  --   --   --   < > = values in this interval not displayed.  Cardiac Enzymes  Recent Labs Lab 04/21/16 1844  TROPONINI 0.85*    Microbiology Results  Results for orders placed or performed during the hospital encounter of 04/10/16  Urine culture     Status: Abnormal   Collection Time: 04/10/16  5:46 PM  Result Value Ref Range Status   Specimen Description URINE, RANDOM  Final   Special Requests NONE  Final   Culture >=100,000 COLONIES/mL ESCHERICHIA COLI (A)  Final   Report Status 04/13/2016 FINAL  Final    Organism ID, Bacteria ESCHERICHIA COLI (A)  Final  Susceptibility   Escherichia coli - MIC*    AMPICILLIN <=2 SENSITIVE Sensitive     CEFAZOLIN <=4 SENSITIVE Sensitive     CEFTRIAXONE <=1 SENSITIVE Sensitive     CIPROFLOXACIN <=0.25 SENSITIVE Sensitive     GENTAMICIN <=1 SENSITIVE Sensitive     IMIPENEM <=0.25 SENSITIVE Sensitive     NITROFURANTOIN <=16 SENSITIVE Sensitive     TRIMETH/SULFA <=20 SENSITIVE Sensitive     AMPICILLIN/SULBACTAM <=2 SENSITIVE Sensitive     PIP/TAZO <=4 SENSITIVE Sensitive     Extended ESBL NEGATIVE Sensitive     * >=100,000 COLONIES/mL ESCHERICHIA COLI  Culture, blood (routine x 2)     Status: None   Collection Time: 04/10/16  7:24 PM  Result Value Ref Range Status   Specimen Description BLOOD LEFT ASSIST CONTROL  Final   Special Requests   Final    BOTTLES DRAWN AEROBIC AND ANAEROBIC [place] AERO 8CC ANA   Culture NO GROWTH 5 DAYS  Final   Report Status 04/15/2016 FINAL  Final  Culture, blood (routine x 2)     Status: None   Collection Time: 04/10/16  7:30 PM  Result Value Ref Range Status   Specimen Description BLOOD RIGHT ASSIST CONTROL  Final   Special Requests BOTTLES DRAWN AEROBIC AND ANAEROBIC 8CC  Final   Culture NO GROWTH 5 DAYS  Final   Report Status 04/15/2016 FINAL  Final    RADIOLOGY:  No results found.  EKG:   Orders placed or performed during the hospital encounter of 04/20/16  . EKG 12-Lead  . EKG 12-Lead  . ED EKG  . ED EKG  . EKG 12-Lead  . EKG 12-Lead  . EKG 12-Lead  . EKG 12-Lead  . EKG 12-Lead  . EKG 12-Lead      Management plans discussed with the patient, family and they are in agreement.  CODE STATUS:     Code Status Orders        Start     Ordered   04/20/16 1802  Full code   Continuous     04/20/16 1801    Code Status History    Date Active Date Inactive Code Status Order ID Comments User Context   04/10/2016 11:23 PM 04/15/2016  7:57 PM Full Code [id number]  Lance Coon, MD Inpatient   03/12/2016   4:31 PM 03/14/2016 10:20 PM Full Code [id number]  Loletha Grayer, MD ED    Advance Directive Documentation        Most Recent Value   Type of Advance Directive  Living will   Pre-existing out of facility DNR order (yellow form or pink MOST form)     "MOST" Form in Place?        TOTAL TIME TAKING CARE OF THIS PATIENT: 38 minutes.    Gladstone Lighter M.D on 04/25/2016 at 3:13 PM  Between 7am to 6pm - Pager - [phone]  After 6pm go to [email or web address] - password [email or web address] [facility]  Office  [phone]  CC: Primary care physician; Kirk Ruths., MD

## 2016-04-25 NOTE — Progress Notes (Signed)
PT Cancellation Note  Patient Details Name: Erica [name] MRN: [id number] DOB: 1931-09-13   Cancelled Treatment:    Reason Eval/Treat Not Completed: Other (comment). Pt out of room for stent placement and is s/p cath procedure done yesterday. Unable to treat patient at this time. Will re-attempt time permitting.   Suan Pyeatt 04/25/2016, 10:28 AM  Greggory Stallion, PT, DPT [phone]

## 2016-04-25 NOTE — Consult Note (Signed)
   [facility] CM Inpatient Consult   04/25/2016  Erica [name] 04/19/1931 [id number]   Patient screened for potential [facility] services. Patient is eligible for [facility]. Electronic medical record reveals patient's discharge plan is SNF with a possible transition to long term care. [facility] Care Management services not appropriate at this time. If patient's post hospital needs change please place a [facility] Care Management consult. For questions please contact:   Emmajo Bennette RN, [facility] Liaison  [phone]) Business Mobile [phone]) Toll free office

## 2016-04-25 NOTE — H&P (View-Only) (Signed)
Patient: Erica [name] / Admit Date: 04/20/2016 / Date of Encounter: 04/24/2016, 7:27 AM   Subjective: She is for cardiac cath this morning. Stress test 6/7 abnormal with intermediate risk. EF 35% with severe HK of inferior and inferolateral segments. Medium defect of severe severity present in the basal inferior, basal inferolateral, mid inferior, mid inferolateral, and apical inferior location. The defect is partially reversible. Defect is noted to be severe intensity at stress, and moderate at rest.   No further chest pain. SOB continues to improve.   Review of Systems: Review of Systems  Constitutional: Positive for malaise/fatigue. Negative for fever, chills, weight loss and diaphoresis.  HENT: Negative for congestion.   Eyes: Negative for discharge and redness.  Respiratory: Positive for cough. Negative for hemoptysis, sputum production, shortness of breath and wheezing.   Cardiovascular: Negative for chest pain, palpitations, orthopnea, claudication, leg swelling and PND.  Gastrointestinal: Negative for heartburn, nausea, vomiting, abdominal pain, blood in stool and melena.  Genitourinary: Negative for hematuria.  Musculoskeletal: Negative for falls.  Skin: Negative for rash.  Neurological: Negative for dizziness, sensory change, speech change, focal weakness, loss of consciousness and weakness.  Endo/Heme/Allergies: Does not bruise/bleed easily.  Psychiatric/Behavioral: The patient is not nervous/anxious.   All other systems reviewed and are negative.   Objective: Telemetry: paced, 92 bpm Physical Exam: Blood pressure 134/67, pulse 88, temperature 97.3 F (36.3 C), temperature source Oral, resp. rate 17, height 5\' 2"  (1.575 m), weight 143 lb (64.864 kg), SpO2 93 %. Body mass index is 26.15 kg/(m^2). General: Well developed, well nourished, in no acute distress. Head: Normocephalic, atraumatic, sclera non-icteric, no xanthomas, nares are without discharge. Neck: Negative for  carotid bruits. JVP not elevated. Lungs: Clear bilaterally to auscultation without wheezes, rales, or rhonchi. Breathing is unlabored. Heart: RRR S1 S2 without murmurs, rubs, or gallops.  Abdomen: Soft, non-tender, non-distended with normoactive bowel sounds. No rebound/guarding. Extremities: No clubbing or cyanosis. No edema. Distal pedal pulses are 2+ and equal bilaterally. Neuro: Alert and oriented X 3. Moves all extremities spontaneously. Psych:  Responds to questions appropriately with a normal affect.   Intake/Output Summary (Last 24 hours) at 04/24/16 0727 Last data filed at 04/24/16 0521  Gross per 24 hour  Intake    243 ml  Output    650 ml  Net   -407 ml    Inpatient Medications:  . aspirin  81 mg Oral Daily  . cholecalciferol  1,000 Units Oral Daily  . clopidogrel  75 mg Oral Daily  . docusate sodium  100 mg Oral BID  . furosemide  20 mg Oral Daily  . levothyroxine  75 mcg Oral Q0600  . midodrine  5 mg Oral TID WC  . mometasone-formoterol  2 puff Inhalation BID  . multivitamin with minerals  1 tablet Oral Daily  . pravastatin  10 mg Oral QHS  . sodium chloride flush  3 mL Intravenous Q12H  . sodium chloride flush  3 mL Intravenous Q12H   Infusions:  . [START ON 04/25/2016] sodium chloride      Labs:  Recent Labs  04/23/16 0211  NA 136  K 4.0  CL 105  CO2 25  GLUCOSE 101*  BUN 20  CREATININE 1.25*  CALCIUM 8.4*   No results for input(s): AST, ALT, ALKPHOS, BILITOT, PROT, ALBUMIN in the last 72 hours.  Recent Labs  04/22/16 0049 04/23/16 0211  WBC 6.7 6.9  HGB 9.5* 10.0*  HCT 27.9* 28.6*  MCV 89.1 88.9  PLT 347 364    Recent Labs  04/21/16 1844  TROPONINI 0.85*   Invalid input(s): POCBNP No results for input(s): HGBA1C in the last 72 hours.   Weights: Filed Weights   04/20/16 1108 04/20/16 1715 04/24/16 0448  Weight: 146 lb (66.225 kg) 141 lb 3.2 oz (64.048 kg) 143 lb (64.864 kg)     Radiology/Studies:  Dg Chest 2 View  04/20/2016   CLINICAL DATA:  Low oxygen saturation.  Shortness of breath. EXAM: CHEST  2 VIEW COMPARISON:  04/15/2016 FINDINGS: Left chest wall ICD is noted with leads in the right atrial appendage, coronary sinus and right ventricle. Moderate cardiac enlargement. There is a small left pleural effusion. Mild diffuse interstitial edema is superimposed upon chronic lung disease. IMPRESSION: 1. Cardiac enlargement, aortic atherosclerosis and mild CHF 2. Chronic lung disease. Electronically Signed   By: Kerby Moors M.D.   On: 04/20/2016 11:46   Dg Chest 2 View  04/15/2016  CLINICAL DATA:  82 year old with current history of COPD, coronary artery disease, CHF and personal history of right breast cancer, presenting with chronic cough. Former smoker. Followup left lower lobe pneumonia. EXAM: CHEST  2 VIEW COMPARISON:  04/10/2016 and earlier. FINDINGS: AP sitting erect and lateral images were obtained. Suboptimal inspiration. Patchy airspace consolidation in the left lower lobe, unchanged since the examination 5 days ago. Small left pleural effusion. Mild atelectasis in the right lower lobe and right middle lobe. No new pulmonary parenchymal abnormalities. Stable mild moderate cardiomegaly. Biventricular pacing defibrillator unchanged and appears intact. Right coronary artery stent. IMPRESSION: Stable left lower lobe pneumonia and associated small left pleural effusion. Mild atelectasis in the right middle lobe and right lower lobe related to suboptimal inspiration. Stable cardiomegaly without pulmonary edema. Electronically Signed   By: Evangeline Dakin M.D.   On: 04/15/2016 10:41   Ct Angio Chest Pe W/cm &/or Wo Cm  04/20/2016  CLINICAL DATA:  Shortness of breath and tachycardia. History of breast carcinoma EXAM: CT ANGIOGRAPHY CHEST WITH CONTRAST TECHNIQUE: Multidetector CT imaging of the chest was performed using the standard protocol during bolus administration of intravenous contrast. Multiplanar CT image reconstructions  and MIPs were obtained to evaluate the vascular anatomy. CONTRAST:  70 mL Isovue 370 nonionic COMPARISON:  Chest radiograph April 20, 2016 ; chest CT October 19, 2009 FINDINGS: Mediastinum/Lymph Nodes: There is no demonstrable pulmonary embolus. There is no thoracic aortic aneurysm or dissection. The visualize great vessels show moderate calcification at their respective origins. There is atherosclerotic calcification throughout the aorta. There is extensive coronary artery calcification. Pericardium is not appreciably thickened. Heart is mildly enlarged. Pacemaker leads are attached to the right atrium and right ventricle. The left lobe of the thyroid is diffusely enlarged and inhomogeneous in attenuation, a finding not present on prior study. There are scattered small mediastinal lymph nodes. There is no frank adenopathy on this study. Lungs/Pleura: There is extensive airspace consolidation in the posterior segment left upper lobe. A lesser degree of infiltrate is noted in the posterior segment of the right upper lobe. Several tiny interstitial fibrosis throughout the lungs bilaterally, primarily in the lower lobes but also to some extent in the upper lobes, slightly more severe on the right than on the left. There is probable mild interstitial edema superimposed. There is a small left pleural effusion. Upper abdomen: Visualized upper abdominal structures appear unremarkable except for atherosclerotic calcification in the abdominal aorta. Musculoskeletal: There are no blastic or lytic bone lesions. Review of the MIP images confirms the  above findings. IMPRESSION: No demonstrable pulmonary embolus. Airspace consolidation in the posterior segments of both upper lobes, more severe on the left than on the right. These areas rib consistent with pneumonia. Suspect mild interstitial edema. There is a small left effusion. There is a degree of underlying parenchymal fibrosis in both upper and lower lobes with relative lower  lobe predominance. These changes progressed since the 2010 study. Cardiomegaly. Pacemaker leads attached to right atrium and right ventricle. The left lobe of the thyroid is diffusely enlarged and irregular and appearance. This finding represents a change compared to prior study. Nonemergent thyroid ultrasound is advised to assess for possible neoplasm arising from the left lobe of the thyroid. There are small lymph nodes present without frank adenopathy by size criteria. There is extensive atherosclerotic calcification. Extensive coronary artery calcification noted. Electronically Signed   By: Lowella Grip III M.D.   On: 04/20/2016 14:16   Nm Myocar Multi W/spect W/wall Motion / Ef  04/23/2016   There was no ST segment deviation noted during stress.  No T wave inversion was noted during stress.  This is an intermediate risk study. Cannot rule out ischemia.  Nuclear stress EF: 35%. There is note of severe hypokinesis of inferior and inferolateral segments.  There is a medium defect of severe severity present in the basal inferior, basal inferolateral, mid inferior, mid inferolateral and apical inferior location. The defect is partially reversible. The perfusion defect is noted to be severe intensity at stress, moderate at rest.    Dg [facility]  04/10/2016  CLINICAL DATA:  Acute onset of generalized weakness, chest pain, fever and dysuria. Initial encounter. EXAM: PORTABLE CHEST 1 VIEW COMPARISON:  Chest radiograph performed 03/12/2016 FINDINGS: The lungs are hypoexpanded. Patchy left-sided airspace opacity could reflect mild pneumonia. There is no evidence of pleural effusion or pneumothorax. The cardiomediastinal silhouette is borderline enlarged. A pacemaker/AICD is noted overlying the left chest wall, with leads ending overlying the right atrium, right ventricle and coronary sinus. No acute osseous abnormalities are seen. IMPRESSION: Lungs hypoexpanded. Patchy left-sided airspace opacity  could reflect mild pneumonia, given the patient's symptoms. Borderline cardiomegaly. Electronically Signed   By: Garald Balding M.D.   On: 04/10/2016 19:35     Assessment and Plan  Principal Problem:   COPD (chronic obstructive pulmonary disease) (HCC) Active Problems:   Orthostatic hypotension   NSTEMI (non-ST elevated myocardial infarction) (HCC)   Frequent falls   PNA (pneumonia)   Dizziness   Chronic systolic congestive heart failure ([place])    1. Elevated troponin/CAD s/p prior stenting as above: -Had an episode of chest pain 2 days prior to her admission that lasted for less than 5 minutes, none since and no associated symptoms -Status post heparin gtt for 48 hours total. Discontinued 6/7 -Abnormal nuclear stress test 6/7 -She is for cardiac cath this afternoon with Dr. Fletcher Anon, MD -Recent echo 02/2016 -Troponin peak of 1.2, possible supply demand ischemia in the setting of her CHF/hypotension -Risks and benefits of cardiac catheterization have been discussed with the patient including risks of bleeding, bruising, infection, kidney damage, stroke, heart attack, and death. The patient understands these risks and is willing to proceed with the procedure. All questions have been answered and concerns listened to  2. Dizziness/pre-syncope/syncope/orthostatic hypotension: -Found to be orthostatic upon admission -Status post NS bolus -On midodrine as of April admission, though she was not taking this at home and this was not continued upon admission -Continue midodrine, titrate as needed -BP  stable -PT to see  3. Chronic combined CHF s/p ICD: -She does appear to be volume overloaded at this time -Continue Lasix -Not on BB upon admission, patient's choice   4. ILD: -Per IM  5. PNA: -On ABX per IM  6. CKD stage II-III: -Stable -Per IM  7. HLD: -Statin   Signed, Christell Faith, PA-C [facility] Pager: [phone] 04/24/2016, 7:27 AM

## 2016-04-25 NOTE — Interval H&P Note (Signed)
Cath Lab Visit (complete for each Cath Lab visit)  Clinical Evaluation Leading to the Procedure:   ACS: Yes.    Non-ACS:    Anginal Classification: CCS IV  Anti-ischemic medical therapy: Minimal Therapy (1 class of medications)  Non-Invasive Test Results: No non-invasive testing performed  Prior CABG: No previous CABG      History and Physical Interval Note:  04/25/2016 9:35 AM  Erica [name]  has presented today for surgery, with the diagnosis of non-Stemi    PCI  The various methods of treatment have been discussed with the patient and family. After consideration of risks, benefits and other options for treatment, the patient has consented to  Procedure(s): Coronary Stent Intervention (N/A) as a surgical intervention .  The patient's history has been reviewed, patient examined, no change in status, stable for surgery.  I have reviewed the patient's chart and labs.  Questions were answered to the patient's satisfaction.     Kathlyn [place]

## 2016-04-25 NOTE — Progress Notes (Addendum)
CSW was informed by MD Tressia Miners that patient may discharge over the weekend. CSW requested MD to complete discharge orders/summary to prepare for weekend discharge. [place] provided Room # [phone] and report number [phone] if patient is to discharge over the weekend. CSW will continue to follow and assist.   Ernest Pine, MSW, [place] Work Department [phone]

## 2016-04-26 DIAGNOSIS — R2681 Unsteadiness on feet: Secondary | ICD-10-CM | POA: Diagnosis not present

## 2016-04-26 DIAGNOSIS — I429 Cardiomyopathy, unspecified: Secondary | ICD-10-CM | POA: Diagnosis not present

## 2016-04-26 DIAGNOSIS — R55 Syncope and collapse: Secondary | ICD-10-CM | POA: Diagnosis not present

## 2016-04-26 DIAGNOSIS — I110 Hypertensive heart disease with heart failure: Secondary | ICD-10-CM | POA: Diagnosis not present

## 2016-04-26 DIAGNOSIS — Z8673 Personal history of transient ischemic attack (TIA), and cerebral infarction without residual deficits: Secondary | ICD-10-CM | POA: Diagnosis not present

## 2016-04-26 DIAGNOSIS — Z955 Presence of coronary angioplasty implant and graft: Secondary | ICD-10-CM | POA: Diagnosis not present

## 2016-04-26 DIAGNOSIS — E039 Hypothyroidism, unspecified: Secondary | ICD-10-CM | POA: Diagnosis not present

## 2016-04-26 DIAGNOSIS — I509 Heart failure, unspecified: Secondary | ICD-10-CM | POA: Diagnosis not present

## 2016-04-26 DIAGNOSIS — I2510 Atherosclerotic heart disease of native coronary artery without angina pectoris: Secondary | ICD-10-CM | POA: Diagnosis not present

## 2016-04-26 DIAGNOSIS — I214 Non-ST elevation (NSTEMI) myocardial infarction: Secondary | ICD-10-CM | POA: Diagnosis not present

## 2016-04-26 DIAGNOSIS — J449 Chronic obstructive pulmonary disease, unspecified: Secondary | ICD-10-CM | POA: Diagnosis not present

## 2016-04-26 DIAGNOSIS — E785 Hyperlipidemia, unspecified: Secondary | ICD-10-CM | POA: Diagnosis not present

## 2016-04-26 DIAGNOSIS — Z9181 History of falling: Secondary | ICD-10-CM | POA: Diagnosis not present

## 2016-04-26 DIAGNOSIS — I5022 Chronic systolic (congestive) heart failure: Secondary | ICD-10-CM | POA: Diagnosis not present

## 2016-04-26 DIAGNOSIS — Z853 Personal history of malignant neoplasm of breast: Secondary | ICD-10-CM | POA: Diagnosis not present

## 2016-04-26 DIAGNOSIS — I951 Orthostatic hypotension: Secondary | ICD-10-CM | POA: Diagnosis not present

## 2016-04-26 DIAGNOSIS — I447 Left bundle-branch block, unspecified: Secondary | ICD-10-CM | POA: Diagnosis not present

## 2016-04-26 DIAGNOSIS — E559 Vitamin D deficiency, unspecified: Secondary | ICD-10-CM | POA: Diagnosis not present

## 2016-04-26 DIAGNOSIS — H5442 Blindness, left eye, normal vision right eye: Secondary | ICD-10-CM | POA: Diagnosis not present

## 2016-04-26 DIAGNOSIS — M6281 Muscle weakness (generalized): Secondary | ICD-10-CM | POA: Diagnosis not present

## 2016-04-26 DIAGNOSIS — Z950 Presence of cardiac pacemaker: Secondary | ICD-10-CM | POA: Diagnosis not present

## 2016-04-26 LAB — BASIC METABOLIC PANEL
ANION GAP: 4 — AB (ref 5–15)
BUN: 12 mg/dL (ref 6–20)
CHLORIDE: 109 mmol/L (ref 101–111)
CO2: 25 mmol/L (ref 22–32)
CREATININE: 0.99 mg/dL (ref 0.44–1.00)
Calcium: 8.2 mg/dL — ABNORMAL LOW (ref 8.9–10.3)
GFR calc non Af Amer: 51 mL/min — ABNORMAL LOW (ref >60)
GFR, EST AFRICAN AMERICAN: 59 mL/min — AB (ref >60)
Glucose, Bld: 89 mg/dL (ref 65–99)
Potassium: 4.1 mmol/L (ref 3.5–5.1)
Sodium: 138 mmol/L (ref 135–145)

## 2016-04-26 LAB — CBC
HCT: 26.4 % — ABNORMAL LOW (ref 35.0–47.0)
HEMOGLOBIN: 8.9 g/dL — AB (ref 12.0–16.0)
MCH: 31 pg (ref 26.0–34.0)
MCHC: 33.5 g/dL (ref 32.0–36.0)
MCV: 92.4 fL (ref 80.0–100.0)
Platelets: 287 10*3/uL (ref 150–440)
RBC: 2.86 MIL/uL — AB (ref 3.80–5.20)
RDW: 16.1 % — ABNORMAL HIGH (ref 11.5–14.5)
WBC: 4.9 10*3/uL (ref 3.6–11.0)

## 2016-04-26 NOTE — Progress Notes (Signed)
Pt discharged to [facility].  Daughter is taking patient by Pov to facility.  Ivs discontinued, cathedars intact.  Sites covered with clean dry dressing.

## 2016-04-26 NOTE — Progress Notes (Signed)
Patient with out CP, SOB . Stable and ready for d/c to SNF. Full not to follow.

## 2016-04-26 NOTE — Discharge Summary (Signed)
[facility] [place] NAME: Erica [name]    MR#:  [id number]  DATE OF BIRTH:  1931-04-05  DATE OF ADMISSION:  04/20/2016 ADMITTING PHYSICIAN: Gladstone Lighter, MD  DATE OF DISCHARGE:  04/26/16  PRIMARY CARE PHYSICIAN: Kirk Ruths., MD    ADMISSION DIAGNOSIS:  Shortness of breath [R06.02] Thyroid nodule [E04.1] Community acquired pneumonia [J18.9] NSTEMI (non-ST elevated myocardial infarction) ([place]) [I21.4] Near syncope [R55]  DISCHARGE DIAGNOSIS:  Principal Problem:   COPD (chronic obstructive pulmonary disease) (HCC) Active Problems:   Orthostatic hypotension   Frequent falls   NSTEMI (non-ST elevated myocardial infarction) (HCC)   PNA (pneumonia)   Dizziness   Chronic systolic congestive heart failure ([place])   SECONDARY DIAGNOSIS:   Past Medical History  Diagnosis Date  . Nonischemic cardiomyopathy ([employer])     a. s/p Medtronic CRT-D, NYHA II-III at baseline; b. echo 2011: EF 43%, mildly dilated LA, PASP 14mm Hg; c. echo 3/16: EF 35-40%, GR1DD, mild to mod AI, mild MR, PASP 65 mmHg  . Coronary artery disease     a. s/p prior stent-RCA; b. cath 6/14: mild to mod prox to mid LAD dz, mod prox to dRCA at 60% unchanged from prior cath; c. nuc 3/16: no sig ischemia, nl WM, EF 19%  . History of MI (myocardial infarction)   . Dyslipidemia   . Hypertension   . COPD (chronic obstructive pulmonary disease) ([place])   . Hypothyroidism   . Basal cell carcinoma 2011  . Respiratory infection   . Vitamin D deficiency   . Breast cancer ([place]) 30+ yrs. ago    right breast, radiation  . TIA (transient ischemic attack) 02/2016    a. negative head CT x 2, unable to have MRI brain given ICD; b. carotid doppler with 0-49% stenosis bilaterally with antegrade flow bilaterally   . Blind left eye   . Frequent falls   . LBBB (left bundle branch block)   . Orthostatic hypotension     HOSPITAL COURSE:   Erica [name] is a 82 y.o.  female with a known history of CAD status post stents, TIA/CVA in April 2017, combined ischemic cardiomyopathy with EF of 35% status post ICD and pacemaker, chronic interstitial lung disease, hypertension presents to the hospital from [facility] secondary to recurrent dizziness and syncopal episodes.  1. NSTEMI Patient underwent Myoview which was reported as  positive with reversible changes in inferior and inferolateral leads She is s/p cardiac cath and RCA stent. It is advised that she continue dual antiplatelet therapy, along with statin and beta blocker.  She will need close follow-up with cardiology.   2. Recovering pneumonia finished Keflex and azithromycin   3. Recurrent syncope, dizziness- Secondary to orthostatic hypotension which is improved now with midodrine  4.Chronic Systolic CHF ejection fraction 35%- patient does not appear to be in exacerbation.  Continue Lasix 5 Interstitial lung disease currently on 2 L oxygen As needed.   6. Hypothyroid: Continue Synthroid  DISCHARGE CONDITIONS:   Stable  CONSULTS OBTAINED:  Treatment Team:  Wellington Hampshire, MD  DRUG ALLERGIES:   Allergies  Allergen Reactions  . Ciprofloxacin Anaphylaxis and Hives  . Ace Inhibitors Other (See Comments)    Reaction:  Unknown   . Amlodipine-Atorvastatin Other (See Comments)    Reaction:  Unknown   . Atorvastatin Other (See Comments)    Muscle cramps  . Amoxicillin Itching, Rash and Other (See Comments)    Has patient  had a PCN reaction causing immediate rash, facial/tongue/throat swelling, SOB or lightheadedness with hypotension: No Has patient had a PCN reaction causing severe rash involving mucus membranes or skin necrosis: No Has patient had a PCN reaction that required hospitalization No Has patient had a PCN reaction occurring within the last 10 years: Yes If all of the above answers are "NO", then may proceed with Cephalosporin use.  . Quinolones Itching and  Rash    DISCHARGE MEDICATIONS:   Current Discharge Medication List    CONTINUE these medications which have CHANGED   Details  chlorpheniramine-HYDROcodone (TUSSIONEX PENNKINETIC ER) 10-8 MG/5ML SUER Take 5 mLs by mouth every 12 (twelve) hours as needed for cough. Qty: 115 mL, Refills: 0    furosemide (LASIX) 20 MG tablet Take 1 tablet (20 mg total) by mouth daily. Qty: 30 tablet, Refills: 0    LORazepam (ATIVAN) 1 MG tablet Take 1 tablet (1 mg total) by mouth at bedtime as needed for anxiety or sleep. Qty: 15 tablet, Refills: 0      CONTINUE these medications which have NOT CHANGED   Details  acetaminophen (TYLENOL) 325 MG tablet Take 325 mg by mouth every 6 (six) hours as needed.    aspirin 81 MG chewable tablet Chew 1 tablet (81 mg total) by mouth daily. Qty: 30 tablet, Refills: 2    Cholecalciferol 2000 units CAPS Take 1 capsule by mouth daily.    clopidogrel (PLAVIX) 75 MG tablet Take 1 tablet (75 mg total) by mouth daily. Qty: 30 tablet, Refills: 2    Fluticasone-Salmeterol (ADVAIR DISKUS) 250-50 MCG/DOSE AEPB Inhale 1 puff into the lungs 2 (two) times daily. Qty: 60 each, Refills: 1    levothyroxine (SYNTHROID, LEVOTHROID) 75 MCG tablet Take 75 mcg by mouth daily before breakfast.     Multiple Vitamins-Minerals (MULTIVITAMIN GUMMIES ADULT PO) Take 1 each by mouth daily at 12 noon.     pravastatin (PRAVACHOL) 10 MG tablet Take 10 mg by mouth at bedtime.  Refills: 0      STOP taking these medications     azithromycin (ZITHROMAX) 250 MG tablet      cephALEXin (KEFLEX) 500 MG capsule          DISCHARGE INSTRUCTIONS:   1. PCP follow up in 1-2 weeks 2. Cardiology follow up in 2 weeks   If you experience worsening of your admission symptoms, develop shortness of breath, life threatening emergency, suicidal or homicidal thoughts you must seek medical attention immediately by calling 911 or calling your MD immediately  if symptoms less severe.  You Must read  complete instructions/literature along with all the possible adverse reactions/side effects for all the Medicines you take and that have been prescribed to you. Take any new Medicines after you have completely understood and accept all the possible adverse reactions/side effects.   Please note  You were cared for by a hospitalist during your hospital stay. If you have any questions about your discharge medications or the care you received while you were in the hospital after you are discharged, you can call the unit and asked to speak with the hospitalist on call if the hospitalist that took care of you is not available. Once you are discharged, your primary care physician will handle any further medical issues. Please note that NO REFILLS for any discharge medications will be authorized once you are discharged, as it is imperative that you return to your primary care physician (or establish a relationship with a primary care physician if  you do not have one) for your aftercare needs so that they can reassess your need for medications and monitor your lab values.    Today   CHIEF COMPLAINT:  Patient reports she is so ready to be discharged. She denies chest pain or shortness breath.  VITAL SIGNS:  Blood pressure 135/66, pulse 95, temperature 97.3 F (36.3 C), temperature source Oral, resp. rate 23, height 5\' 2"  (1.575 m), weight 63.3 kg (139 lb 8.8 oz), SpO2 98 %.  I/O:    Intake/Output Summary (Last 24 hours) at 04/26/16 1238 Last data filed at 04/26/16 0200  Gross per 24 hour  Intake    860 ml  Output    900 ml  Net    -40 ml    PHYSICAL EXAMINATION:   Physical Exam  Constitutional: She is oriented to person, place, and time and well-developed, well-nourished, and in no distress. No distress.  HENT:  Head: Normocephalic.  Eyes: No scleral icterus.  Neck: Normal range of motion. Neck supple. No JVD present. No tracheal deviation present.  Cardiovascular: Normal rate, regular  rhythm and normal heart sounds.  Exam reveals no gallop and no friction rub.   No murmur heard. Pulmonary/Chest: Effort normal and breath sounds normal. No respiratory distress. She has no wheezes. She has no rales. She exhibits no tenderness.  Abdominal: Soft. Bowel sounds are normal. She exhibits no distension and no mass. There is no tenderness. There is no rebound and no guarding.  Musculoskeletal: Normal range of motion. She exhibits no edema.  Neurological: She is alert and oriented to person, place, and time.  Skin: Skin is warm. No rash noted. No erythema.  Psychiatric: Affect and judgment normal.      DATA REVIEW:   CBC  Recent Labs Lab 04/26/16 0414  WBC 4.9  HGB 8.9*  HCT 26.4*  PLT 287    Chemistries   Recent Labs Lab 04/20/16 1219  04/26/16 0414  NA 136  < > 138  K 4.5  < > 4.1  CL 101  < > 109  CO2 27  < > 25  GLUCOSE 109*  < > 89  BUN 15  < > 12  CREATININE 1.24*  < > 0.99  CALCIUM 8.6*  < > 8.2*  MG 2.1  --   --   AST 24  --   --   ALT 30  --   --   ALKPHOS 167*  --   --   BILITOT 0.3  --   --   < > = values in this interval not displayed.  Cardiac Enzymes  Recent Labs Lab 04/21/16 1844  TROPONINI 0.85*    Microbiology Results  Results for orders placed or performed during the hospital encounter of 04/10/16  Urine culture     Status: Abnormal   Collection Time: 04/10/16  5:46 PM  Result Value Ref Range Status   Specimen Description URINE, RANDOM  Final   Special Requests NONE  Final   Culture >=100,000 COLONIES/mL ESCHERICHIA COLI (A)  Final   Report Status 04/13/2016 FINAL  Final   Organism ID, Bacteria ESCHERICHIA COLI (A)  Final      Susceptibility   Escherichia coli - MIC*    AMPICILLIN <=2 SENSITIVE Sensitive     CEFAZOLIN <=4 SENSITIVE Sensitive     CEFTRIAXONE <=1 SENSITIVE Sensitive     CIPROFLOXACIN <=0.25 SENSITIVE Sensitive     GENTAMICIN <=1 SENSITIVE Sensitive     IMIPENEM <=0.25 SENSITIVE Sensitive  NITROFURANTOIN <=16 SENSITIVE Sensitive     TRIMETH/SULFA <=20 SENSITIVE Sensitive     AMPICILLIN/SULBACTAM <=2 SENSITIVE Sensitive     PIP/TAZO <=4 SENSITIVE Sensitive     Extended ESBL NEGATIVE Sensitive     * >=100,000 COLONIES/mL ESCHERICHIA COLI  Culture, blood (routine x 2)     Status: None   Collection Time: 04/10/16  7:24 PM  Result Value Ref Range Status   Specimen Description BLOOD LEFT ASSIST CONTROL  Final   Special Requests   Final    BOTTLES DRAWN AEROBIC AND ANAEROBIC [place] AERO 8CC ANA   Culture NO GROWTH 5 DAYS  Final   Report Status 04/15/2016 FINAL  Final  Culture, blood (routine x 2)     Status: None   Collection Time: 04/10/16  7:30 PM  Result Value Ref Range Status   Specimen Description BLOOD RIGHT ASSIST CONTROL  Final   Special Requests BOTTLES DRAWN AEROBIC AND ANAEROBIC 8CC  Final   Culture NO GROWTH 5 DAYS  Final   Report Status 04/15/2016 FINAL  Final    RADIOLOGY:  No results found.  EKG:   Orders placed or performed during the hospital encounter of 04/20/16  . EKG 12-Lead  . EKG 12-Lead  . ED EKG  . ED EKG  . EKG 12-Lead  . EKG 12-Lead  . EKG 12-Lead  . EKG 12-Lead  . EKG 12-Lead  . EKG 12-Lead  . EKG 12-Lead immediately post procedure  . EKG 12-Lead  . EKG 12-Lead immediately post procedure  . EKG 12-Lead  . EKG 12-Lead  . EKG 12-Lead      Management plans discussed with the patient, family and they are in agreement.  CODE STATUS:     Code Status Orders        Start     Ordered   04/20/16 1802  Full code   Continuous     04/20/16 1801    Code Status History    Date Active Date Inactive Code Status Order ID Comments User Context   04/10/2016 11:23 PM 04/15/2016  7:57 PM Full Code [id number]  Lance Coon, MD Inpatient   03/12/2016  4:31 PM 03/14/2016 10:20 PM Full Code [id number]  Loletha Grayer, MD ED    Advance Directive Documentation        Most Recent Value   Type of Advance Directive  Living will   Pre-existing out of  facility DNR order (yellow form or pink MOST form)     "MOST" Form in Place?        TOTAL TIME TAKING CARE OF THIS PATIENT: 36 minutes.    Farren Nelles M.D on 04/26/2016 at 12:38 PM  Between 7am to 6pm - Pager - [phone]  After 6pm go to [email or web address] - password [email or web address] [facility]  Office  [phone]  CC: Primary care physician; Kirk Ruths., MD

## 2016-04-26 NOTE — Clinical Social Work Placement (Signed)
   CLINICAL SOCIAL WORK PLACEMENT  NOTE  Date:  04/26/2016  Patient Details  Name: Erica [name] MRN: [id number] Date of Birth: 03-17-1931  Clinical Social Work is seeking post-discharge placement for this patient at the [place] level of care (*CSW will initial, date and re-position this form in  chart as items are completed):  No   Patient/family provided with [facility] Work Department's list of facilities offering this level of care within the geographic area requested by the patient (or if unable, by the patient's family).  No   Patient/family informed of their freedom to choose among providers that offer the needed level of care, that participate in Medicare, Medicaid or managed care program needed by the patient, have an available bed and are willing to accept the patient.  No   Patient/family informed of [facility]'s ownership interest in [facility] and [facility], as well as of the fact that they are under no obligation to receive care at these facilities.  PASRR submitted to EDS on       PASRR number received on       Existing PASRR number confirmed on 04/23/16     FL2 transmitted to all facilities in geographic area requested by pt/family on 04/23/16     FL2 transmitted to all facilities within larger geographic area on       Patient informed that his/her managed care company has contracts with or will negotiate with certain facilities, including the following:        Yes   Patient/family informed of bed offers received.  Patient chooses bed at  [facility])     Physician recommends and patient chooses bed at      Patient to be transferred to  [facility]) on 04/26/16.  Patient to be transferred to facility by  [name]- daughter )     Patient family notified on 04/26/16 of transfer.  Name of family member notified:   [name]- daughter)     PHYSICIAN       Additional Comment:     _______________________________________________ Baldemar Lenis, LCSW 04/26/2016, 12:13 PM

## 2016-04-26 NOTE — Progress Notes (Signed)
Clinical Social Worker was informed that patient will be medically ready to discharge to [facility]. CSW met with patient and her daughter- Lori at bedside and are in a agreement with plan. CSW called [facility] to confirm that patient's bed is ready. Provided patient's room number 305 and number to call for report [phone] . All discharge information faxed to [facility] via HUB. RN will call report and patient will discharge to [facility] via her daughter Lori.  Christina Sunkins, MSW, LCSW-A Clinical Social Work Department [phone]     

## 2016-04-28 DIAGNOSIS — J449 Chronic obstructive pulmonary disease, unspecified: Secondary | ICD-10-CM | POA: Diagnosis not present

## 2016-04-28 DIAGNOSIS — J189 Pneumonia, unspecified organism: Secondary | ICD-10-CM | POA: Diagnosis not present

## 2016-04-28 DIAGNOSIS — I509 Heart failure, unspecified: Secondary | ICD-10-CM | POA: Diagnosis not present

## 2016-04-28 DIAGNOSIS — I2510 Atherosclerotic heart disease of native coronary artery without angina pectoris: Secondary | ICD-10-CM | POA: Diagnosis not present

## 2016-04-28 DIAGNOSIS — E871 Hypo-osmolality and hyponatremia: Secondary | ICD-10-CM | POA: Diagnosis not present

## 2016-04-29 ENCOUNTER — Encounter: Payer: PPO | Admitting: Internal Medicine

## 2016-05-03 DIAGNOSIS — R300 Dysuria: Secondary | ICD-10-CM | POA: Diagnosis not present

## 2016-05-03 DIAGNOSIS — D649 Anemia, unspecified: Secondary | ICD-10-CM | POA: Diagnosis not present

## 2016-05-03 DIAGNOSIS — E871 Hypo-osmolality and hyponatremia: Secondary | ICD-10-CM | POA: Diagnosis not present

## 2016-05-03 LAB — URINALYSIS COMPLETE WITH MICROSCOPIC (ARMC ONLY)
BILIRUBIN URINE: NEGATIVE
GLUCOSE, UA: NEGATIVE mg/dL
Hgb urine dipstick: NEGATIVE
KETONES UR: NEGATIVE mg/dL
Nitrite: POSITIVE — AB
PH: 6 (ref 5.0–8.0)
Protein, ur: NEGATIVE mg/dL
Specific Gravity, Urine: 1.004 — ABNORMAL LOW (ref 1.005–1.030)

## 2016-05-05 LAB — URINE CULTURE: Culture: >=100000 — AB

## 2016-05-08 DIAGNOSIS — R300 Dysuria: Secondary | ICD-10-CM | POA: Diagnosis not present

## 2016-05-08 LAB — CBC WITH DIFFERENTIAL/PLATELET
BASOS ABS: 0 10*3/uL (ref 0–0.1)
Basophils Relative: 1 %
EOS PCT: 9 %
Eosinophils Absolute: 0.4 10*3/uL (ref 0–0.7)
HEMATOCRIT: 30.6 % — AB (ref 35.0–47.0)
Hemoglobin: 10.6 g/dL — ABNORMAL LOW (ref 12.0–16.0)
LYMPHS ABS: 1.3 10*3/uL (ref 1.0–3.6)
LYMPHS PCT: 26 %
MCH: 31.6 pg (ref 26.0–34.0)
MCHC: 34.8 g/dL (ref 32.0–36.0)
MCV: 90.9 fL (ref 80.0–100.0)
Monocytes Absolute: 0.5 10*3/uL (ref 0.2–0.9)
Monocytes Relative: 10 %
NEUTROS ABS: 2.8 10*3/uL (ref 1.4–6.5)
Neutrophils Relative %: 54 %
PLATELETS: 213 10*3/uL (ref 150–440)
RBC: 3.37 MIL/uL — AB (ref 3.80–5.20)
RDW: 18.2 % — ABNORMAL HIGH (ref 11.5–14.5)
WBC: 5.1 10*3/uL (ref 3.6–11.0)

## 2016-05-08 LAB — BASIC METABOLIC PANEL
ANION GAP: 8 (ref 5–15)
BUN: 22 mg/dL — AB (ref 6–20)
CHLORIDE: 98 mmol/L — AB (ref 101–111)
CO2: 24 mmol/L (ref 22–32)
Calcium: 8.8 mg/dL — ABNORMAL LOW (ref 8.9–10.3)
Creatinine, Ser: 1.43 mg/dL — ABNORMAL HIGH (ref 0.44–1.00)
GFR calc Af Amer: 38 mL/min — ABNORMAL LOW (ref >60)
GFR, EST NON AFRICAN AMERICAN: 33 mL/min — AB (ref >60)
GLUCOSE: 96 mg/dL (ref 65–99)
POTASSIUM: 3.9 mmol/L (ref 3.5–5.1)
Sodium: 130 mmol/L — ABNORMAL LOW (ref 135–145)

## 2016-05-09 DIAGNOSIS — R300 Dysuria: Secondary | ICD-10-CM | POA: Diagnosis not present

## 2016-05-09 LAB — BASIC METABOLIC PANEL
ANION GAP: 9 (ref 5–15)
BUN: 19 mg/dL (ref 6–20)
CO2: 22 mmol/L (ref 22–32)
Calcium: 8.8 mg/dL — ABNORMAL LOW (ref 8.9–10.3)
Chloride: 97 mmol/L — ABNORMAL LOW (ref 101–111)
Creatinine, Ser: 1.2 mg/dL — ABNORMAL HIGH (ref 0.44–1.00)
GFR calc Af Amer: 47 mL/min — ABNORMAL LOW (ref >60)
GFR calc non Af Amer: 41 mL/min — ABNORMAL LOW (ref >60)
GLUCOSE: 80 mg/dL (ref 65–99)
POTASSIUM: 3.6 mmol/L (ref 3.5–5.1)
Sodium: 128 mmol/L — ABNORMAL LOW (ref 135–145)

## 2016-05-09 LAB — CBC WITH DIFFERENTIAL/PLATELET
BASOS ABS: 0 10*3/uL (ref 0–0.1)
Basophils Relative: 1 %
Eosinophils Absolute: 0.4 10*3/uL (ref 0–0.7)
Eosinophils Relative: 8 %
HEMATOCRIT: 28.3 % — AB (ref 35.0–47.0)
Hemoglobin: 9.9 g/dL — ABNORMAL LOW (ref 12.0–16.0)
LYMPHS ABS: 1.5 10*3/uL (ref 1.0–3.6)
LYMPHS PCT: 28 %
MCH: 31.4 pg (ref 26.0–34.0)
MCHC: 34.9 g/dL (ref 32.0–36.0)
MCV: 90.1 fL (ref 80.0–100.0)
MONO ABS: 0.5 10*3/uL (ref 0.2–0.9)
Monocytes Relative: 9 %
NEUTROS ABS: 2.9 10*3/uL (ref 1.4–6.5)
Neutrophils Relative %: 54 %
Platelets: 201 10*3/uL (ref 150–440)
RBC: 3.14 MIL/uL — AB (ref 3.80–5.20)
RDW: 18.1 % — ABNORMAL HIGH (ref 11.5–14.5)
WBC: 5.4 10*3/uL (ref 3.6–11.0)

## 2016-05-13 DIAGNOSIS — F411 Generalized anxiety disorder: Secondary | ICD-10-CM | POA: Diagnosis not present

## 2016-05-13 DIAGNOSIS — R300 Dysuria: Secondary | ICD-10-CM | POA: Diagnosis not present

## 2016-05-13 LAB — CBC WITH DIFFERENTIAL/PLATELET
Basophils Absolute: 0 10*3/uL (ref 0–0.1)
Basophils Relative: 1 %
Eosinophils Absolute: 0.4 10*3/uL (ref 0–0.7)
Eosinophils Relative: 7 %
HEMATOCRIT: 30.5 % — AB (ref 35.0–47.0)
HEMOGLOBIN: 10.8 g/dL — AB (ref 12.0–16.0)
LYMPHS ABS: 1.2 10*3/uL (ref 1.0–3.6)
LYMPHS PCT: 23 %
MCH: 32.2 pg (ref 26.0–34.0)
MCHC: 35.5 g/dL (ref 32.0–36.0)
MCV: 90.8 fL (ref 80.0–100.0)
MONOS PCT: 8 %
Monocytes Absolute: 0.4 10*3/uL (ref 0.2–0.9)
NEUTROS PCT: 61 %
Neutro Abs: 3.3 10*3/uL (ref 1.4–6.5)
Platelets: 199 10*3/uL (ref 150–440)
RBC: 3.36 MIL/uL — AB (ref 3.80–5.20)
RDW: 17.5 % — ABNORMAL HIGH (ref 11.5–14.5)
WBC: 5.3 10*3/uL (ref 3.6–11.0)

## 2016-05-13 LAB — BASIC METABOLIC PANEL
Anion gap: 8 (ref 5–15)
BUN: 24 mg/dL — AB (ref 6–20)
CHLORIDE: 96 mmol/L — AB (ref 101–111)
CO2: 26 mmol/L (ref 22–32)
CREATININE: 1.25 mg/dL — AB (ref 0.44–1.00)
Calcium: 9.1 mg/dL (ref 8.9–10.3)
GFR calc Af Amer: 45 mL/min — ABNORMAL LOW (ref >60)
GFR calc non Af Amer: 39 mL/min — ABNORMAL LOW (ref >60)
GLUCOSE: 97 mg/dL (ref 65–99)
POTASSIUM: 4.3 mmol/L (ref 3.5–5.1)
Sodium: 130 mmol/L — ABNORMAL LOW (ref 135–145)

## 2016-05-15 DIAGNOSIS — I214 Non-ST elevation (NSTEMI) myocardial infarction: Secondary | ICD-10-CM | POA: Diagnosis not present

## 2016-05-15 DIAGNOSIS — R2681 Unsteadiness on feet: Secondary | ICD-10-CM | POA: Diagnosis not present

## 2016-05-15 DIAGNOSIS — R296 Repeated falls: Secondary | ICD-10-CM | POA: Diagnosis not present

## 2016-05-15 DIAGNOSIS — M6281 Muscle weakness (generalized): Secondary | ICD-10-CM | POA: Diagnosis not present

## 2016-05-15 DIAGNOSIS — R300 Dysuria: Secondary | ICD-10-CM | POA: Diagnosis not present

## 2016-05-15 LAB — BASIC METABOLIC PANEL
ANION GAP: 5 (ref 5–15)
BUN: 21 mg/dL — AB (ref 6–20)
CHLORIDE: 94 mmol/L — AB (ref 101–111)
CO2: 28 mmol/L (ref 22–32)
Calcium: 8.8 mg/dL — ABNORMAL LOW (ref 8.9–10.3)
Creatinine, Ser: 0.95 mg/dL (ref 0.44–1.00)
GFR calc Af Amer: >60 mL/min (ref >60)
GFR, EST NON AFRICAN AMERICAN: 54 mL/min — AB (ref >60)
GLUCOSE: 99 mg/dL (ref 65–99)
POTASSIUM: 4.1 mmol/L (ref 3.5–5.1)
Sodium: 127 mmol/L — ABNORMAL LOW (ref 135–145)

## 2016-05-17 ENCOUNTER — Encounter
Admission: RE | Admit: 2016-05-17 | Discharge: 2016-05-17 | Disposition: A | Discharge status: Home / Self Care | Payer: PPO | Source: Ambulatory Visit | Attending: Internal Medicine | Admitting: Internal Medicine

## 2016-05-17 DIAGNOSIS — R000 Tachycardia, unspecified: Secondary | ICD-10-CM | POA: Insufficient documentation

## 2016-05-17 DIAGNOSIS — E039 Hypothyroidism, unspecified: Secondary | ICD-10-CM | POA: Insufficient documentation

## 2016-05-17 DIAGNOSIS — E871 Hypo-osmolality and hyponatremia: Secondary | ICD-10-CM | POA: Insufficient documentation

## 2016-05-19 DIAGNOSIS — M6281 Muscle weakness (generalized): Secondary | ICD-10-CM | POA: Diagnosis not present

## 2016-05-19 DIAGNOSIS — I214 Non-ST elevation (NSTEMI) myocardial infarction: Secondary | ICD-10-CM | POA: Diagnosis not present

## 2016-05-19 DIAGNOSIS — R296 Repeated falls: Secondary | ICD-10-CM | POA: Diagnosis not present

## 2016-05-19 DIAGNOSIS — R2681 Unsteadiness on feet: Secondary | ICD-10-CM | POA: Diagnosis not present

## 2016-05-27 ENCOUNTER — Encounter: Payer: Self-pay | Admitting: Physician Assistant

## 2016-05-28 ENCOUNTER — Ambulatory Visit (INDEPENDENT_AMBULATORY_CARE_PROVIDER_SITE_OTHER): Payer: PPO | Admitting: Physician Assistant

## 2016-05-28 ENCOUNTER — Encounter: Payer: PPO | Admitting: Physician Assistant

## 2016-05-28 ENCOUNTER — Encounter: Payer: Self-pay | Admitting: Physician Assistant

## 2016-05-28 VITALS — BP 120/72 | HR 97 | Ht 62.0 in | Wt 135.5 lb

## 2016-05-28 DIAGNOSIS — I2510 Atherosclerotic heart disease of native coronary artery without angina pectoris: Secondary | ICD-10-CM

## 2016-05-28 DIAGNOSIS — I951 Orthostatic hypotension: Secondary | ICD-10-CM | POA: Diagnosis not present

## 2016-05-28 DIAGNOSIS — E785 Hyperlipidemia, unspecified: Secondary | ICD-10-CM

## 2016-05-28 DIAGNOSIS — N182 Chronic kidney disease, stage 2 (mild): Secondary | ICD-10-CM

## 2016-05-28 DIAGNOSIS — I5042 Chronic combined systolic (congestive) and diastolic (congestive) heart failure: Secondary | ICD-10-CM | POA: Diagnosis not present

## 2016-05-28 DIAGNOSIS — I5022 Chronic systolic (congestive) heart failure: Secondary | ICD-10-CM

## 2016-05-28 DIAGNOSIS — R0602 Shortness of breath: Secondary | ICD-10-CM

## 2016-05-28 DIAGNOSIS — I10 Essential (primary) hypertension: Secondary | ICD-10-CM

## 2016-05-28 DIAGNOSIS — J849 Interstitial pulmonary disease, unspecified: Secondary | ICD-10-CM

## 2016-05-28 MED ORDER — MIDODRINE HCL 2.5 MG PO TABS: 2.5000 mg | ORAL_TABLET | Freq: Two times a day (BID) | ORAL | Status: DC

## 2016-05-28 NOTE — Patient Instructions (Signed)
Medication Instructions:  Your physician has recommended you make the following change in your medication:  1. Start Midodrine 2.5 mg twice a day with food.   Labwork: None ordered  Testing/Procedures: None ordered  Follow-Up: Your physician recommends that you schedule a follow-up appointment in: 1 week   It was a pleasure seeing you today here in the office. Please do not hesitate to give [place] a call back if you have any further questions. [place], BSN      Any Other Special Instructions Will Be Listed Below (If Applicable).     If you need a refill on your cardiac medications before your next appointment, please call your pharmacy.

## 2016-05-28 NOTE — Progress Notes (Signed)
Cardiology Office Note Date:  05/28/2016  Patient ID:  Erica, [name] [birth year], MRN [id number] PCP:  Kirk Ruths., MD  Cardiologist:  Dr. Rockey Situ, MD    Chief Complaint: Hospital follow up  History of Present Illness: Erica [name] is a 82 y.o. female with history of CAD s/p remote stenting to RCA and most recently with PCI/DES to the RCA, recent TIA/CVA at the end of April 2017, combined ICM/NICM s/p BiV ICD with generator change out on 04/05/2015 by Dr. Caryl Comes, MD (originally placed at [facility]), chronic combined CHF with NYHA class 2-3, LBBB, HTN, HLD, frequent falls, chronic dizziness, left-eye blindness, and chronic ILD who presents for hospital follow up from recent admission to [facility] from 6/4-6/10 April for NSTEMI, orthostatic hypotension, and COPD.   Cardiac cath from 2014 showed mild to moderate proximal to mid LAD stenosis, moderate proximal and distal RCA disease estimated at 60% without change from prior cardiac cath in 2009, EF >55%. Medical management was advised. She underwent ischemic evaluation via nuclear stress testing in 01/2015 that showed no significant ischemia or WMA, EF 19%. Follow up echo on 02/02/2015 showed an EF of 35-40%, GR1DD, mild to moderate AI, mild MR, PASP 65 mm hg. Prior echo from 2011 showed an EF of 43%, mildly dilated LA, mild pulmonary HTN at 33 mm Hg. She takes Lasix prn, though was initially started on this with dosing of every other day. She has had issues with chronic dizziness and increasing falls lately. They have appeared to be with standing, deconditioning, and anxiety. BP has been documented as stable during those times with a SBP not dropping lower than 110 mm Hg. She was seen in the ED in March for UTI and near syncope. Head CT was negative. She has scheduled neurology follow up. Admitted in late April for TIA symptoms. Head CT was negative x 2, she was unable to undergo MRI brain given her ICD. tPA was not given as her symptoms resolved by the  time her presented. Troponin was found to be mildly elevated with a peak of 0.59, BNP 557. Echo was performed on 03/12/16 that showed EF 30-35%, moderate concentric hypertrophy, mild AI, moderate MR, mild biatrial enlargement, moderate TR. Bilateral carotid ultrasound was negative for occlusive PAD. She was seen by neurology, Plavix was added to her daily aspirin. She was found to be orthostatic and started on midodrine. She was admitted again in late May for LLL PNA and pyelonephritis, treated with ABX and discharged.   She was admitted on 6/4 with complaints of dizziness and reported pre-syncope/syncope. She was placed on midodrine in late April, but had not been taking this at home. Upon her arrival to [facility] she was found to have an elevated troponin at 1.02 on 6/3. Her troponin further trended to 0.65-->0.70-->1.21. Patient reported having a single episode of chest pain 2-3 days prior to her admission. CXR showed chronic lung disease, mild CHF. She had a CTA PE protocol that was negative for PE. There was evidence of PNA in the upper lobes of the lungs. Mild interstitial edema. She was also noted to have an abnormal left lobe of the thyroid with further work up advised. EKG showed A sensed, V paced rhythm of 104 bpm. Orthostatic vitals were positive. Stress test on 6/8 showed a partially reversible medium defect of severe severity along the basal inferior, basal inferolateral, mid inferior, mid inferolateral, and apical inferior location. Perfusion defect was at it's severest intensity at stress. EF 35%. Overall,  intermediate risk study. She underwent LHC on 6/8 that showed severe 1-vessel CAD affecting the proximal, mid, and distal RCA. (ostial to proximal RCA 90%, distal RCA 80%). The proximal disease was just before the previously placed stent with 2 overlapping stents in the mid and distal segemnt with significant restenosis in the midsegment followed by a short aneurysmal area and significant distal ISR.  She underwent successful PCI/DES to the ostial/proximal and distal RCA with 0% residual stenosis of the ostial/proximal segment and 10% residual stenosis of the distal segment on 6/9, as an emergency occured in a separate patient requiring delayed intervention by one day. Remaining cardiac cath details showed ostial left main 20%,proximal to mid LAD 20%, ostial to proximal LCx 30%. She saw her PCP on 6/12 and was doing well.   She has not had any further chest pain and has not missed any doses of her DAPT. She is tolerating medications without issues. She continues to note issues with labile blood pressures noting fatigue and dizziness when her SBP is between 80-90 mm Hg. Upon reviewing her BP readings from the nursing facility they have been in the low 100 to 1-teens for the most part with a rare reading in the 90's and today she was 89 systolic. Of note, he was previously on midodrine in the above hospitalizations with good results, though this has never been continued as an outpatient. She has not had any SOB. Her son feels like she is doing quite well.     Past Medical History  Diagnosis Date  . Ischemic cardiomyopathy     a. s/p [employer] CRT-D, NYHA II-III at baseline; b. echo 2011: EF 43%, mildly dilated LA, PASP 84mm Hg; c. echo 3/16: EF 35-40%, GR1DD, mild to mod AI, mild MR, PASP 65 mmHg  . History of MI (myocardial infarction)   . Dyslipidemia   . Hypertension   . COPD (chronic obstructive pulmonary disease) ([place])   . Hypothyroidism   . Basal cell carcinoma 2011  . Respiratory infection   . Vitamin D deficiency   . Breast cancer ([place]) 30+ yrs. ago    right breast, radiation  . TIA (transient ischemic attack) 02/2016    a. negative head CT x 2, unable to have MRI brain given ICD; b. carotid doppler with 0-49% stenosis bilaterally with antegrade flow bilaterally   . Blind left eye   . Frequent falls   . LBBB (left bundle branch block)   . Orthostatic hypotension   . Coronary artery  disease     a. s/p prior stent-RCA; b. cath 6/14: mild to mod prox to mid LAD dz, mod prox to dRCA at 60% unchanged from prior cath; c. nuc 3/16: no sig ischemia, nl WM, EF 19%; d. nuc 6/17: intermediate risk; e. cath 6/17 LM 20%, p-oLAD 20%, ostLCx 30%, ost/pRCA 90% s/p PCI/DES, dRCA 80% s/p PCI/DES    Past Surgical History  Procedure Laterality Date  . Cardiac defibrillator placement    . Insert / replace / remove pacemaker    . Mastectomy    . Abdominal hysterectomy    . Basal cell carcinoma excision      Monh's procedure  . Ep implantable device N/A 04/05/2015    Procedure: BIV ICD Generator Changeout;  Surgeon: Deboraha Sprang, MD;  Location: [place] [facility];  Service: Cardiovascular;  Laterality: N/A;  . Breast excisional biopsy Right 30+yrs ago    positive  . Cardiac catheterization  2014  . Cardiac catheterization N/A 04/24/2016  Procedure: Left Heart Cath and Coronary Angiography;  Surgeon: Wellington Hampshire, MD;  Location: [place] [facility];  Service: Cardiovascular;  Laterality: N/A;  . Cardiac catheterization N/A 04/25/2016    Procedure: Coronary Stent Intervention;  Surgeon: Wellington Hampshire, MD;  Location: [place] [facility];  Service: Cardiovascular;  Laterality: N/A;    Current Outpatient Prescriptions  Medication Sig Dispense Refill  . acetaminophen (TYLENOL) 325 MG tablet Take 325 mg by mouth every 6 (six) hours as needed.    Marland Kitchen aspirin 81 MG chewable tablet Chew 1 tablet (81 mg total) by mouth daily. 30 tablet 2  . chlorpheniramine-HYDROcodone (TUSSIONEX PENNKINETIC ER) 10-8 MG/5ML SUER Take 5 mLs by mouth every 12 (twelve) hours as needed for cough. 115 mL 0  . Cholecalciferol 2000 units CAPS Take 1 capsule by mouth daily.    . clopidogrel (PLAVIX) 75 MG tablet Take 1 tablet (75 mg total) by mouth daily. 30 tablet 2  . escitalopram (LEXAPRO) 5 MG tablet Take 5 mg by mouth daily.    . Fluticasone-Salmeterol (ADVAIR DISKUS) 250-50 MCG/DOSE AEPB Inhale 1 puff into  the lungs 2 (two) times daily. (Patient taking differently: Inhale 1 puff into the lungs daily. ) 60 each 1  . furosemide (LASIX) 20 MG tablet Take 1 tablet (20 mg total) by mouth daily. 30 tablet 0  . levothyroxine (SYNTHROID, LEVOTHROID) 75 MCG tablet Take 75 mcg by mouth daily before breakfast.     . LORazepam (ATIVAN) 1 MG tablet Take 1 tablet (1 mg total) by mouth at bedtime as needed for anxiety or sleep. 15 tablet 0  . Multiple Vitamins-Minerals (MULTIVITAMIN GUMMIES ADULT PO) Take 1 each by mouth daily at 12 noon.     . pravastatin (PRAVACHOL) 10 MG tablet Take 10 mg by mouth at bedtime.   0   No current facility-administered medications for this visit.    Allergies:   Ciprofloxacin; Ace inhibitors; Amlodipine-atorvastatin; Atorvastatin; Amoxicillin; and Quinolones   Social History:  The patient  reports that she has quit smoking. She has never used smokeless tobacco. She reports that she does not drink alcohol or use illicit drugs.   Family History:  The patient's family history includes Cancer in her other; Coronary artery disease in her other; Hypertension in her father, mother, and other; Stroke in her father and mother.  ROS:   Review of Systems  Constitutional: Positive for malaise/fatigue. Negative for fever, chills, weight loss and diaphoresis.  HENT: Negative for congestion.   Eyes: Negative for discharge and redness.  Respiratory: Negative for cough, hemoptysis, sputum production, shortness of breath and wheezing.   Cardiovascular: Negative for chest pain, palpitations, orthopnea, claudication, leg swelling and PND.  Gastrointestinal: Negative for heartburn, nausea, vomiting and abdominal pain.  Musculoskeletal: Negative for myalgias and falls.  Skin: Negative for rash.  Neurological: Positive for dizziness. Negative for tingling, tremors, sensory change, speech change, focal weakness, loss of consciousness and weakness.  Endo/Heme/Allergies: Does not bruise/bleed easily.   Psychiatric/Behavioral: Negative for substance abuse. The patient is not nervous/anxious.   All other systems reviewed and are negative.    PHYSICAL EXAM:  VS:  BP 120/72 mmHg  Pulse 97  Ht 5\' 2"  (1.575 m)  Wt 135 lb 8 oz (61.462 kg)  BMI 24.78 kg/m2 BMI: Body mass index is 24.78 kg/(m^2).  Physical Exam  Constitutional: She is oriented to person, place, and time. She appears well-developed and well-nourished.  HENT:  Head: Normocephalic and atraumatic.  Eyes: Right eye exhibits no  discharge. Left eye exhibits no discharge.  Neck: Normal range of motion. No JVD present.  Cardiovascular: Normal rate, regular rhythm, S1 normal, S2 normal and normal heart sounds.  Exam reveals no distant heart sounds, no friction rub, no midsystolic click and no opening snap.   No murmur heard. Pulmonary/Chest: Effort normal and breath sounds normal. No respiratory distress. She has no decreased breath sounds. She has no wheezes. She has no rales. She exhibits no tenderness.  Abdominal: Soft. She exhibits no distension. There is no tenderness.  Musculoskeletal: She exhibits no edema.  Neurological: She is alert and oriented to person, place, and time.  Skin: Skin is warm and dry. No cyanosis. Nails show no clubbing.  Psychiatric: She has a normal mood and affect. Her speech is normal and behavior is normal. Judgment and thought content normal.     EKG:  Was ordered and interpreted by me today. Shows A sensed V paced rhythm, 98 bpm  Recent Labs: 03/13/2016: B Natriuretic Peptide 557.0* 04/20/2016: ALT 30; Magnesium 2.1; TSH 2.237 05/13/2016: Hemoglobin 10.8*; Platelets 199 05/15/2016: BUN 21*; Creatinine, Ser 0.95; Potassium 4.1; Sodium 127*  03/13/2016: Cholesterol 149; HDL 45; LDL Cholesterol 81; Total CHOL/HDL Ratio 3.3; Triglycerides 115; VLDL 23   Estimated Creatinine Clearance: 38.7 mL/min (by C-G formula based on Cr of 0.95).   Wt Readings from Last 3 Encounters:  05/28/16 135 lb 8 oz (61.462  kg)  04/26/16 139 lb 8.8 oz (63.3 kg)  04/15/16 146 lb 8 oz (66.452 kg)     Other studies reviewed: Additional studies/records reviewed today include: summarized above  ASSESSMENT AND PLAN:  1. CAD with recent NSTEMI June 2017 s/p PCI as above: Continue DAPT with aspirin 81 mg and Plavix 75 mg daily. Not on beta blocker at time of admission in June or discharge (patient's choice 2/2 hypotension). No further chest pain.    2. Orthostatic hypotension: This has been a long standing issue for her. She continues to complain of fatigue and dizziness when her blood pressure drop into the [id number] to [id number] systolic. Her midodrine was stopped prior to her discharge 2/2 blood pressures in the [id number] systolic. Will restart a lower dose of midodrine at 2.5 mg bid at this time with the hope that her blood pressure will improve enough to be able to start low-dose beta blocker for #1. She will need short term follow up to evaluate her BP, though the family says she cannot come back until August with Dr. Caryl Comes, MD as [place].   3. Chronic combined CHF/ICM s/p BiV ICD: Essentially no change in EF from echo 01/2015 to 02/2016 at 35%. Followed by Dr. Virl Axe, MD. She does not appear to be volume overloaded at this time. Continue low-dose Lasix.    4. ILD: Per PCP.   5. CKD stage II-III: Improved as of last bmet on 05/15/2016.  6.         HLD: Last LDL of 81 on 03/13/2016. Goal LDL < 70. On pravastatin, change at next visit.   Disposition: F/u with Dr. Caryl Comes in August   Current medicines are reviewed at length with the patient today.  The patient did not have any concerns regarding medicines.  Melvern Banker PA-C 05/28/2016 1:39 PM     [address] [place],  [id number] [phone]

## 2016-05-30 ENCOUNTER — Encounter: Payer: PPO | Admitting: Physician Assistant

## 2016-06-14 ENCOUNTER — Non-Acute Institutional Stay (SKILLED_NURSING_FACILITY): Payer: PPO | Admitting: Gerontology

## 2016-06-14 DIAGNOSIS — R000 Tachycardia, unspecified: Secondary | ICD-10-CM | POA: Diagnosis not present

## 2016-06-14 DIAGNOSIS — E871 Hypo-osmolality and hyponatremia: Secondary | ICD-10-CM

## 2016-06-14 NOTE — Progress Notes (Signed)
Location:  [facility] Service:  SNF [phone]) Provider:  Toni Arthurs, NP-C  Kirk Ruths., MD  Patient Care Team: Kirk Ruths, MD as PCP - General (Internal Medicine) Minna Merritts, MD as Consulting Physician (Cardiology) Deboraha Sprang, MD as Consulting Physician (Cardiology)  Extended Emergency Contact Information Primary Emergency Contact: [facility] Address: [address]          [place], [place] [id number] Johnnette Litter of [place] Phone: [phone] Relation: Daughter Secondary Emergency Contact: Genevive Bi [place] of [place] Mobile Phone: [phone] Relation: None  Code Status:  Full Goals of care: Advanced Directive information Advanced Directives 04/24/2016  Does patient have an advance directive? Yes  Type of Advance Directive Living will  Does patient want to make changes to advanced directive? No - Patient declined  Copy of advanced directive(s) in chart? -     Chief Complaint  Patient presents with  . Medical Management of Chronic Issues    HPI:  Pt is a 82 y.o. female seen today for medical management of chronic diseases. She has a h/o chronic hyponatremia. She has been on a liberalized sodium diet with BID Gatorade. Labs to be assessed next week for improvement. No c/o dizziness, confusion, neuralgias. Pt also has new onset tachycardia. Pt has an extensive cardiac history and is on midodrine for B/P stability. Pt denies chest pain, shortness of breath. Minimal edema in BLE.   Past Medical History:  Diagnosis Date  . Basal cell carcinoma 2011  . Blind left eye   . Breast cancer ([place]) 30+ yrs. ago   right breast, radiation  . COPD (chronic obstructive pulmonary disease) ([place])   . Coronary artery disease    a. s/p prior stent-RCA; b. cath 6/14: mild to mod prox to mid LAD dz, mod prox to dRCA at 60% unchanged from prior cath; c. nuc 3/16: no sig ischemia, nl WM, EF 19%; d. nuc 6/17:  intermediate risk; e. cath 6/17 LM 20%, p-oLAD 20%, ostLCx 30%, ost/pRCA 90% s/p PCI/DES, dRCA 80% s/p PCI/DES  . Dyslipidemia   . Frequent falls   . History of MI (myocardial infarction)   . Hypertension   . Hypothyroidism   . Ischemic cardiomyopathy    a. s/p Medtronic CRT-D, NYHA II-III at baseline; b. echo 2011: EF 43%, mildly dilated LA, PASP 54m Hg; c. echo 3/16: EF 35-40%, GR1DD, mild to mod AI, mild MR, PASP 65 mmHg  . LBBB (left bundle branch block)   . Orthostatic hypotension   . Respiratory infection   . TIA (transient ischemic attack) 02/2016   a. negative head CT x 2, unable to have MRI brain given ICD; b. carotid doppler with 0-49% stenosis bilaterally with antegrade flow bilaterally   . Vitamin D deficiency    Past Surgical History:  Procedure Laterality Date  . ABDOMINAL HYSTERECTOMY    . BASAL CELL CARCINOMA EXCISION     Monh's procedure  . BREAST EXCISIONAL BIOPSY Right 30+yrs ago   positive  . CARDIAC CATHETERIZATION  2014  . CARDIAC CATHETERIZATION N/A 04/24/2016   Procedure: Left Heart Cath and Coronary Angiography;  Surgeon: MWellington Hampshire MD;  [facility];  Service: Cardiovascular;  Laterality: N/A;  . CARDIAC CATHETERIZATION N/A 04/25/2016   Procedure: Coronary Stent Intervention;  Surgeon: MWellington Hampshire MD;  [facility];  Service: Cardiovascular;  Laterality: N/A;  . CARDIAC DEFIBRILLATOR PLACEMENT    . EP IMPLANTABLE DEVICE N/A  04/05/2015   Procedure: BIV ICD Generator Changeout;  Surgeon: Deboraha Sprang, MD;  Location: [place] [facility];  Service: Cardiovascular;  Laterality: N/A;  . INSERT / REPLACE / REMOVE PACEMAKER    . MASTECTOMY      Allergies  Allergen Reactions  . Ciprofloxacin Anaphylaxis and Hives  . Ace Inhibitors Other (See Comments)    Reaction:  Unknown   . Amlodipine-Atorvastatin Other (See Comments)    Reaction:  Unknown   . Atorvastatin Other (See Comments)    Muscle cramps  . Amoxicillin  Itching, Rash and Other (See Comments)    Has patient had a PCN reaction causing immediate rash, facial/tongue/throat swelling, SOB or lightheadedness with hypotension: No Has patient had a PCN reaction causing severe rash involving mucus membranes or skin necrosis: No Has patient had a PCN reaction that required hospitalization No Has patient had a PCN reaction occurring within the last 10 years: Yes If all of the above answers are "NO", then may proceed with Cephalosporin use.  . Quinolones Itching and Rash      Medication List       Accurate as of 06/14/16 11:59 PM. Always use your most recent med list.          acetaminophen 325 MG tablet Commonly known as:  TYLENOL Take 325 mg by mouth every 6 (six) hours as needed.   aspirin 81 MG chewable tablet Chew 1 tablet (81 mg total) by mouth daily.   chlorpheniramine-HYDROcodone 10-8 MG/5ML Suer Commonly known as:  TUSSIONEX PENNKINETIC ER Take 5 mLs by mouth every 12 (twelve) hours as needed for cough.   Cholecalciferol 2000 units Caps Take 1 capsule by mouth daily.   clopidogrel 75 MG tablet Commonly known as:  PLAVIX Take 1 tablet (75 mg total) by mouth daily.   escitalopram 5 MG tablet Commonly known as:  LEXAPRO Take 5 mg by mouth daily.   Fluticasone-Salmeterol 250-50 MCG/DOSE Aepb Commonly known as:  ADVAIR DISKUS Inhale 1 puff into the lungs 2 (two) times daily.   furosemide 20 MG tablet Commonly known as:  LASIX Take 1 tablet (20 mg total) by mouth daily.   levothyroxine 75 MCG tablet Commonly known as:  SYNTHROID, LEVOTHROID Take 75 mcg by mouth daily before breakfast.   LORazepam 1 MG tablet Commonly known as:  ATIVAN Take 1 tablet (1 mg total) by mouth at bedtime as needed for anxiety or sleep.   midodrine 2.5 MG tablet Commonly known as:  PROAMATINE Take 1 tablet (2.5 mg total) by mouth 2 (two) times daily with a meal.   MULTIVITAMIN GUMMIES ADULT PO Take 1 each by mouth daily at 12 noon.     pravastatin 10 MG tablet Commonly known as:  PRAVACHOL Take 10 mg by mouth at bedtime.       Review of Systems  Constitutional: Negative for activity change, appetite change, chills, diaphoresis and fever.  HENT: Negative for congestion, sneezing, sore throat, trouble swallowing and voice change.   Eyes: Negative for pain, redness and visual disturbance.  Respiratory: Negative for apnea, cough, choking, chest tightness, shortness of breath and wheezing.   Cardiovascular: Negative for chest pain, palpitations and leg swelling.  Gastrointestinal: Negative for abdominal distention, abdominal pain, constipation, diarrhea and nausea.  Genitourinary: Negative for difficulty urinating, dysuria, frequency and urgency.  Musculoskeletal: Negative for back pain, gait problem and myalgias. Arthralgias: typical arthritis.  Skin: Negative for color change, pallor, rash and wound.  Neurological: Negative for dizziness, tremors, syncope, speech difficulty,  weakness, numbness and headaches.  Psychiatric/Behavioral: Negative for agitation and behavioral problems.  All other systems reviewed and are negative.   There is no immunization history for the selected administration types on file for this patient. Pertinent  Health Maintenance Due  Topic Date Due  . DEXA SCAN  01/31/1998  . PNA vac Low Risk Adult (1 of 2 - PCV13) 01/31/1998  . INFLUENZA VACCINE  06/17/2016   No flowsheet data found. Functional Status Survey:    Vitals:   06/14/16 2342  BP: 109/63  Pulse: 92  Resp: 20  Temp: 98 F (36.7 C)  SpO2: 95%   There is no height or weight on file to calculate BMI. Physical Exam  Constitutional: She is oriented to person, place, and time. Vital signs are normal. She appears well-developed and well-nourished. She is active and cooperative. She does not appear ill. No distress.  HENT:  Head: Normocephalic and atraumatic.  Mouth/Throat: Uvula is midline, oropharynx is clear and moist and  mucous membranes are normal. Mucous membranes are not pale, not dry and not cyanotic.  Eyes: Conjunctivae, EOM and lids are normal. Pupils are equal, round, and reactive to light.  Neck: Trachea normal, normal range of motion and full passive range of motion without pain. Neck supple. No JVD present. No tracheal deviation, no edema and no erythema present. No thyromegaly present.  Cardiovascular: Regular rhythm, normal heart sounds, intact distal pulses and normal pulses.  Tachycardia present.  Exam reveals no gallop, no distant heart sounds and no friction rub.   No murmur heard. Pulmonary/Chest: Effort normal and breath sounds normal. No accessory muscle usage. No respiratory distress. She has no wheezes. She exhibits no tenderness.  Abdominal: Normal appearance and bowel sounds are normal. She exhibits no distension and no ascites. There is no tenderness.  Musculoskeletal: Normal range of motion. She exhibits no edema or tenderness.  Expected osteoarthritis, stiffness  Neurological: She is alert and oriented to person, place, and time. She exhibits abnormal muscle tone (weakness). Gait abnormal.  Skin: Skin is warm, dry and intact. She is not diaphoretic. No cyanosis. No pallor. Nails show no clubbing.  Psychiatric: She has a normal mood and affect. Her speech is normal and behavior is normal. Judgment and thought content normal. Cognition and memory are normal.  Nursing note and vitals reviewed.   Labs reviewed:  Recent Labs  04/20/16 1219  05/09/16 0400 05/13/16 0815 05/15/16 0614  NA 136  < > 128* 130* 127*  K 4.5  < > 3.6 4.3 4.1  CL 101  < > 97* 96* 94*  CO2 27  < > _0 GLUCOSE 109*  < > 80 97 99  BUN 15  < > 19 24* 21*  CREATININE 1.24*  < > 1.20* 1.25* 0.95  CALCIUM 8.6*  < > 8.8* 9.1 8.8*  MG 2.1  --   --   --   --   < > = values in this interval not displayed.  Recent Labs  04/20/16 1219  AST 24  ALT 30  ALKPHOS 167*  BILITOT 0.3  PROT 6.9  ALBUMIN 2.6*     Recent Labs  05/08/16 0821 05/09/16 0400 05/13/16 0815  WBC 5.1 5.4 5.3  NEUTROABS 2.8 2.9 3.3  HGB 10.6* 9.9* 10.8*  HCT 30.6* 28.3* 30.5*  MCV 90.9 90.1 90.8  PLT 213 201 199   Lab Results  Component Value Date   TSH 2.237 04/20/2016   No results found for: HGBA1C  Lab Results  Component Value Date   CHOL 149 03/13/2016   HDL 45 03/13/2016   LDLCALC 81 03/13/2016   TRIG 115 03/13/2016   CHOLHDL 3.3 03/13/2016    Significant Diagnostic Results in last 30 days:  No results found.  Assessment/Plan 1. Tachycardia Metoprolol succinate 25 mg po Q day. Pt to f/u with cardiologist next week. Check cbc, met c, tsh, mag  2. Hyponatremia Continue with Regular diet with No Sodium restriction. Gatorade on lunch and supper trays. Monitor sodium levels    Family/ staff Communication:   Total Time: 45 minutes  Documentation: 15 minutes  Face to Face: 30 minutes  Family/Phone:   Labs/tests ordered:  CBC, Met C, TSH, Mag+  Medication list reviewed and assessed for continued appropriateness. Monthly medication orders signed.   Vikki Ports, NP-C Geriatrics [facility] [phone], [place] [address] Cell Phone (Mon-Fri 8am-5pm):  [phone] On Call:  [phone] & follow prompts after 5pm & weekends Office Phone:  [phone] Office Fax:  [phone]

## 2016-06-16 DIAGNOSIS — R000 Tachycardia, unspecified: Secondary | ICD-10-CM | POA: Diagnosis not present

## 2016-06-16 DIAGNOSIS — E871 Hypo-osmolality and hyponatremia: Secondary | ICD-10-CM | POA: Diagnosis not present

## 2016-06-16 DIAGNOSIS — E039 Hypothyroidism, unspecified: Secondary | ICD-10-CM | POA: Diagnosis not present

## 2016-06-16 LAB — MAGNESIUM: Magnesium: 2.1 mg/dL (ref 1.7–2.4)

## 2016-06-16 LAB — COMPREHENSIVE METABOLIC PANEL
ALK PHOS: 71 U/L (ref 38–126)
ALT: 18 U/L (ref 14–54)
AST: 22 U/L (ref 15–41)
Albumin: 3.2 g/dL — ABNORMAL LOW (ref 3.5–5.0)
Anion gap: 8 (ref 5–15)
BUN: 23 mg/dL — ABNORMAL HIGH (ref 6–20)
CALCIUM: 9 mg/dL (ref 8.9–10.3)
CO2: 26 mmol/L (ref 22–32)
CREATININE: 1.2 mg/dL — AB (ref 0.44–1.00)
Chloride: 99 mmol/L — ABNORMAL LOW (ref 101–111)
GFR, EST AFRICAN AMERICAN: 47 mL/min — AB (ref >60)
GFR, EST NON AFRICAN AMERICAN: 41 mL/min — AB (ref >60)
Glucose, Bld: 117 mg/dL — ABNORMAL HIGH (ref 65–99)
Potassium: 4.1 mmol/L (ref 3.5–5.1)
Sodium: 133 mmol/L — ABNORMAL LOW (ref 135–145)
TOTAL PROTEIN: 7.2 g/dL (ref 6.5–8.1)
Total Bilirubin: 0.6 mg/dL (ref 0.3–1.2)

## 2016-06-16 LAB — CBC WITH DIFFERENTIAL/PLATELET
Basophils Absolute: 0 10*3/uL (ref 0–0.1)
Basophils Relative: 1 %
EOS ABS: 0.5 10*3/uL (ref 0–0.7)
Eosinophils Relative: 8 %
HEMATOCRIT: 33.8 % — AB (ref 35.0–47.0)
HEMOGLOBIN: 11.9 g/dL — AB (ref 12.0–16.0)
LYMPHS ABS: 1.7 10*3/uL (ref 1.0–3.6)
Lymphocytes Relative: 29 %
MCH: 32.3 pg (ref 26.0–34.0)
MCHC: 35.3 g/dL (ref 32.0–36.0)
MCV: 91.7 fL (ref 80.0–100.0)
MONO ABS: 0.4 10*3/uL (ref 0.2–0.9)
MONOS PCT: 7 %
NEUTROS ABS: 3.3 10*3/uL (ref 1.4–6.5)
NEUTROS PCT: 55 %
Platelets: 217 10*3/uL (ref 150–440)
RBC: 3.69 MIL/uL — ABNORMAL LOW (ref 3.80–5.20)
RDW: 14.5 % (ref 11.5–14.5)
WBC: 5.9 10*3/uL (ref 3.6–11.0)

## 2016-06-16 LAB — TSH: TSH: 1.231 u[IU]/mL (ref 0.350–4.500)

## 2016-06-17 ENCOUNTER — Encounter
Admission: RE | Admit: 2016-06-17 | Discharge: 2016-06-17 | Disposition: A | Discharge status: Home / Self Care | Payer: PPO | Source: Ambulatory Visit | Attending: Internal Medicine | Admitting: Internal Medicine

## 2016-06-23 DIAGNOSIS — F411 Generalized anxiety disorder: Secondary | ICD-10-CM | POA: Diagnosis not present

## 2016-06-24 ENCOUNTER — Encounter (INDEPENDENT_AMBULATORY_CARE_PROVIDER_SITE_OTHER): Payer: Self-pay

## 2016-06-24 ENCOUNTER — Ambulatory Visit (INDEPENDENT_AMBULATORY_CARE_PROVIDER_SITE_OTHER): Payer: PPO | Admitting: Internal Medicine

## 2016-06-24 ENCOUNTER — Encounter: Payer: Self-pay | Admitting: Internal Medicine

## 2016-06-24 ENCOUNTER — Encounter: Payer: Self-pay | Admitting: Cardiovascular Disease

## 2016-06-24 VITALS — BP 176/95 | HR 96 | Ht 62.0 in | Wt 137.8 lb

## 2016-06-24 DIAGNOSIS — I951 Orthostatic hypotension: Secondary | ICD-10-CM

## 2016-06-24 DIAGNOSIS — Z95810 Presence of automatic (implantable) cardiac defibrillator: Secondary | ICD-10-CM | POA: Diagnosis not present

## 2016-06-24 DIAGNOSIS — I5042 Chronic combined systolic (congestive) and diastolic (congestive) heart failure: Secondary | ICD-10-CM

## 2016-06-24 DIAGNOSIS — I428 Other cardiomyopathies: Secondary | ICD-10-CM

## 2016-06-24 DIAGNOSIS — I429 Cardiomyopathy, unspecified: Secondary | ICD-10-CM | POA: Diagnosis not present

## 2016-06-24 NOTE — Patient Instructions (Signed)
Medication Instructions: - Your physician recommends that you continue on your current medications as directed. Please refer to the Current Medication list given to you today.  Labwork: - none  Procedures/Testing: - none  Follow-Up: - Remote monitoring is used to monitor your Pacemaker of ICD from home. This monitoring reduces the number of office visits required to check your device to one time per year. It allows us to keep an eye on the functioning of your device to ensure it is working properly. You are scheduled for a device check from home on 09/23/16. You may send your transmission at any time that day. If you have a wireless device, the transmission will be sent automatically. After your physician reviews your transmission, you will receive a postcard with your next transmission date.  - Your physician wants you to follow-up in: 1 year with Dr. Klein. You will receive a reminder letter in the mail two months in advance. If you don't receive a letter, please call our office to schedule the follow-up appointment.  Any Additional Special Instructions Will Be Listed Below (If Applicable).     If you need a refill on your cardiac medications before your next appointment, please call your pharmacy.   

## 2016-06-24 NOTE — Progress Notes (Signed)
Patient Care Team: Kirk Ruths, MD as PCP - General (Internal Medicine) Minna Merritts, MD as Consulting Physician (Cardiology) Deboraha Sprang, MD as Consulting Physician (Cardiology)   HPI  Erica [name] is a 82 y.o. female seen in followup for her CRT-D that was implanted at [facility] for severe ischemic/nonischemic cardiomyopathy and class 2-3 congestive heart failure with left bundle branch block she underwent device generator replacement 5/16   The patient's and family are not sure that there was significant benefit associated with the implantation. She has had no known arrhythmias.   Echo 2011 showed mild LV dysfunction with an improvement EF-43%  Stress 2011 demonstrated "normal left ventricular function" EF measured at 79%.  Catheterization 6/14 normal LV function without coronary obstruction  The last few months however been associated with falls recurrent hospitalizations pneumonia and subsequently was found to have positive troponins 0.59. Ejection fraction was significantly decreased at 30-35%. 6/17 she presented with dizziness and was complaining of chest pain. Myoview scanning was intermediate risk and she underwent catheterization And stenting of her RCA. She has been treated with dual antiplatelet therapy.   She continues with shortness of breath, it did not improve significantly following stenting but her daughter reports that her functional status is much improved  Recent efforts to augment afterload reduction were complicated by falls. This is improved with reduction of BB  She has not had significant dizziness. Blood pressures have been in the 1:30-150 range. She does complain however of shortness of breath.  There has been no edema.   Past Medical History:  Diagnosis Date  . Basal cell carcinoma 2011  . Blind left eye   . Breast cancer ([place]) 30+ yrs. ago   right breast, radiation  . COPD (chronic obstructive pulmonary disease) ([place])   . Coronary artery  disease    a. s/p prior stent-RCA; b. cath 6/14: mild to mod prox to mid LAD dz, mod prox to dRCA at 60% unchanged from prior cath; c. nuc 3/16: no sig ischemia, nl WM, EF 19%; d. nuc 6/17: intermediate risk; e. cath 6/17 LM 20%, p-oLAD 20%, ostLCx 30%, ost/pRCA 90% s/p PCI/DES, dRCA 80% s/p PCI/DES  . Dyslipidemia   . Frequent falls   . History of MI (myocardial infarction)   . Hypertension   . Hypothyroidism   . Ischemic cardiomyopathy    a. s/p Medtronic CRT-D, NYHA II-III at baseline; b. echo 2011: EF 43%, mildly dilated LA, PASP 68mm Hg; c. echo 3/16: EF 35-40%, GR1DD, mild to mod AI, mild MR, PASP 65 mmHg  . LBBB (left bundle branch block)   . Orthostatic hypotension   . Respiratory infection   . TIA (transient ischemic attack) 02/2016   a. negative head CT x 2, unable to have MRI brain given ICD; b. carotid doppler with 0-49% stenosis bilaterally with antegrade flow bilaterally   . Vitamin D deficiency     Past Surgical History:  Procedure Laterality Date  . ABDOMINAL HYSTERECTOMY    . BASAL CELL CARCINOMA EXCISION     Monh's procedure  . BREAST EXCISIONAL BIOPSY Right 30+yrs ago   positive  . CARDIAC CATHETERIZATION  2014  . CARDIAC CATHETERIZATION N/A 04/24/2016   Procedure: Left Heart Cath and Coronary Angiography;  Surgeon: Wellington Hampshire, MD;  Location: [facility];  Service: Cardiovascular;  Laterality: N/A;  . CARDIAC CATHETERIZATION N/A 04/25/2016   Procedure: Coronary Stent Intervention;  Surgeon: Wellington Hampshire, MD;  Location: [facility];  Service: Cardiovascular;  Laterality: N/A;  . CARDIAC DEFIBRILLATOR PLACEMENT    . EP IMPLANTABLE DEVICE N/A 04/05/2015   Procedure: BIV ICD Generator Changeout;  Surgeon: Deboraha Sprang, MD;  Location: [facility];  Service: Cardiovascular;  Laterality: N/A;  . INSERT / REPLACE / REMOVE PACEMAKER    . MASTECTOMY      Current Outpatient Prescriptions  Medication Sig Dispense Refill  . acetaminophen  (TYLENOL) 325 MG tablet Take 325 mg by mouth every 6 (six) hours as needed.    Marland Kitchen alum & mag hydroxide-simeth (MAG-AL PLUS XS) [id number] MG/5ML suspension Take by mouth every 6 (six) hours as needed for indigestion.    Marland Kitchen aspirin 81 MG chewable tablet Chew 1 tablet (81 mg total) by mouth daily. 30 tablet 2  . Cholecalciferol 2000 units CAPS Take 1 capsule by mouth daily.    . clopidogrel (PLAVIX) 75 MG tablet Take 1 tablet (75 mg total) by mouth daily. 30 tablet 2  . Fluticasone-Salmeterol (ADVAIR DISKUS) 250-50 MCG/DOSE AEPB Inhale 1 puff into the lungs 2 (two) times daily. (Patient taking differently: Inhale 1 puff into the lungs daily. ) 60 each 1  . furosemide (LASIX) 20 MG tablet Take 1 tablet (20 mg total) by mouth daily. 30 tablet 0  . levothyroxine (SYNTHROID, LEVOTHROID) 75 MCG tablet Take 75 mcg by mouth daily before breakfast.     . LORazepam (ATIVAN) 1 MG tablet Take 1 tablet (1 mg total) by mouth at bedtime as needed for anxiety or sleep. 15 tablet 0  . metoprolol succinate (TOPROL-XL) 25 MG 24 hr tablet Take 25 mg by mouth daily.    . midodrine (PROAMATINE) 2.5 MG tablet Take 1 tablet (2.5 mg total) by mouth 2 (two) times daily with a meal. 60 tablet 6  . Multiple Vitamins-Minerals (MULTIVITAMIN GUMMIES ADULT PO) Take 1 each by mouth daily at 12 noon.     . pravastatin (PRAVACHOL) 10 MG tablet Take 10 mg by mouth at bedtime.   0   No current facility-administered medications for this visit.     Allergies  Allergen Reactions  . Ciprofloxacin Anaphylaxis and Hives  . Ace Inhibitors Other (See Comments)    Reaction:  Unknown   . Amlodipine-Atorvastatin Other (See Comments)    Reaction:  Unknown   . Atorvastatin Other (See Comments)    Muscle cramps  . Amoxicillin Itching, Rash and Other (See Comments)    Has patient had a PCN reaction causing immediate rash, facial/tongue/throat swelling, SOB or lightheadedness with hypotension: No Has patient had a PCN reaction causing severe  rash involving mucus membranes or skin necrosis: No Has patient had a PCN reaction that required hospitalization No Has patient had a PCN reaction occurring within the last 10 years: Yes If all of the above answers are "NO", then may proceed with Cephalosporin use.  . Quinolones Itching and Rash    Review of Systems negative except from HPI and PMH  Physical Exam BP (!) 176/95 (BP Location: Left Arm, Patient Position: Sitting, Cuff Size: Normal)   Pulse 96   Ht 5\' 2"  (1.575 m)   Wt 137 lb 12 oz (62.5 kg)   BMI 25.19 kg/m  Well developed and well nourished in no acute distress HENT normal E scleral and icterus clear Neck Supple JVP flat; carotids brisk and full Clear to ausculation  Regular rate and rhythm,  Positive S4 early systolic murmur Soft with active bowel sounds No clubbing cyanosis none Edema Alert and oriented, grossly  normal motor and sensory function Skin Warm and Dry  ECG demonstrates P. Synchronous pacing  Assessment and  Plan  NICM-resolved  CAD  HFrEF  ORTHOSTATIC HYPOTENSION  Hypertension  ICD-CRT-Medtronic    Device function is normal..     Her ongoing shortness of breath maybe related to HFrEF; her hypotension however limits augmentation of her afterload reduction.  We have stressed the importance and risk of falling which has been aggravated by efforts at afterload reduction and diuresis  4 impressive dynamic however today was a discussion related to safety versus independence and Mrs. Raines at the nursing home and wanting to go home and her daughters being concerned about her safety. We discussed this at some length and I suggested that her daughter read [employer] as the concerns regarding security and safety versus independence are challenging and not easily inserted to both parties satisfaction.  More than 50% of 45 min was spent in counseling related to the above

## 2016-06-30 ENCOUNTER — Encounter: Payer: Self-pay | Admitting: Internal Medicine

## 2016-07-08 ENCOUNTER — Encounter: Payer: Self-pay | Admitting: Cardiovascular Disease

## 2016-07-08 ENCOUNTER — Ambulatory Visit: Payer: PPO | Admitting: Cardiovascular Disease

## 2016-07-08 ENCOUNTER — Ambulatory Visit (INDEPENDENT_AMBULATORY_CARE_PROVIDER_SITE_OTHER): Payer: PPO | Admitting: Cardiovascular Disease

## 2016-07-08 VITALS — BP 148/72 | HR 79 | Ht 62.0 in | Wt 142.0 lb

## 2016-07-08 DIAGNOSIS — I429 Cardiomyopathy, unspecified: Secondary | ICD-10-CM

## 2016-07-08 DIAGNOSIS — I428 Other cardiomyopathies: Secondary | ICD-10-CM

## 2016-07-08 DIAGNOSIS — I10 Essential (primary) hypertension: Secondary | ICD-10-CM

## 2016-07-08 DIAGNOSIS — I5022 Chronic systolic (congestive) heart failure: Secondary | ICD-10-CM

## 2016-07-08 DIAGNOSIS — I2510 Atherosclerotic heart disease of native coronary artery without angina pectoris: Secondary | ICD-10-CM | POA: Diagnosis not present

## 2016-07-08 DIAGNOSIS — I951 Orthostatic hypotension: Secondary | ICD-10-CM | POA: Diagnosis not present

## 2016-07-08 DIAGNOSIS — E782 Mixed hyperlipidemia: Secondary | ICD-10-CM | POA: Diagnosis not present

## 2016-07-08 DIAGNOSIS — R531 Weakness: Secondary | ICD-10-CM

## 2016-07-08 DIAGNOSIS — R296 Repeated falls: Secondary | ICD-10-CM

## 2016-07-08 NOTE — Patient Instructions (Addendum)
Medication Instructions:   No medication changes  Labwork:  No new labs  Testing/Procedures:  No new testing  Follow-Up: It was a pleasure seeing you in the office today. Please call us if you have new issues that need to be addressed before your next appt.  [phone]  Your physician wants you to follow-up in: 6 months.  You will receive a reminder letter in the mail two months in advance. If you don't receive a letter, please call our office to schedule the follow-up appointment.  If you need a refill on your cardiac medications before your next appointment, please call your pharmacy.     

## 2016-07-08 NOTE — Progress Notes (Signed)
Cardiology Office Note  Date:  07/08/2016   ID:  Chalese, Wrubel 10/27/1931, MRN [id number]  PCP:  Kirk Ruths., MD   Chief Complaint  Patient presents with  . Other    6 month follow. Meds reviewed by the patient verbally. "doing well."     HPI:  [name] is a 82 year old woman with nonischemic cardiomyopathy CRT-D that was implanted at [facility]  with class 2-3 congestive heart failure with left bundle branch block, coronary artery disease with stenting to her RCA, hyperlipidemia, hypertension   Previous history of bronchitis. her husband passed away earlier in 2012-03-05. She presents for her nonischemic cardiomyopathy Previous x-rays suggesting chronic interstitial disease Previous issues with orthostatic hypotension, on midodrine  Previous hospital records reviewed in detail with the patient She presented to the hospital in June 2017 from [facility] with symptoms of dizziness, syncope. She had not been taking midodrine. Troponin was elevated at 1.0, Evidence of pneumonia on CT scan Stress test June 8 showed partially reversible defect, underwent catheterization showing severe one-vessel coronary disease proximal mid and distal RCA, prior to previously placed stent. She underwent successful stent placement to the ostial/proximal and distal RCA regions  For a week after her hospitalization and was sent to [facility] rehabilitation She reports that her strength is much improved over the past several months Daughter presents with her today and confirms that she is doing much better Walking with a walker without any assistance, no recent falls Patient feels that she is good enough to go home from [facility] but daughter is concerned  Previously was not doing well at home on her own Denies any significant leg edema, no chest pain on exertion  EKG on today's visit shows paced rhythm, rate 88 bpm  Other past medical history reviewed  Recent BIV ICD placement by Dr. Caryl Comes.   Prior ejection fraction March 2016 showing ejection fraction 35-40%, right ventricular systolic pressure 65 mmHg  Previous look at her ejection fraction over the past 8-10 years shows a history of depressed ejection fraction around 40% Echocardiogram done at [facility] in Mar 06, 2007 documented EF of 40% Echocardiogram 2010/03/05 done at [facility] documented EF of 40% Most recent echocardiogram in Mar 06, 2015 documents ejection fraction 35-40%. This echocardiogram did note moderate to severely elevated right ventricular systolic pressures estimated at 65 mmHg.   high right heart pressures on  Echocardiogram, started on Lasix at that time  Cardiac catheterization in late 03/05/2013 showed moderate proximal and distal RCA disease estimated at 60%. Also mild to moderate proximal to mid LAD disease. Ejection fraction estimated at greater than 55%. There is no significant change in her RCA lesions compared to March 05, 2008.  distal RCA lesion with moderate stenosis. The lesions were discussed with interventional cardiology and medical management was recommended .  Previous  Echo 03-05-10 showing mild LV systolic dysfunction, ejection fraction 43%, mildly dilated left atrium, mild pulmonary hypertension with right ventricular systolic pressure of 33  Stress test 03/05/2010 showing normal study with normal ejection fraction.  This was a lexiscan.  cardiac catheterization in January 2008 showing 40% restenosis of mid RCA stent and 50% mid LAD disease. Notes indicate 2 stents in her RCA.  Labs from 12/28/2013 TSH 4.47, BNP 1200 Chest x-ray with bilateral chronic interstitial disease  PMH:   has a past medical history of Basal cell carcinoma Mar 05, 2010); Blind left eye; Breast cancer ([place]) (30+ yrs. ago); COPD (chronic obstructive pulmonary disease) ([place]); Coronary artery disease; Dyslipidemia; Frequent falls; History  of MI (myocardial infarction); Hypertension; Hypothyroidism; Ischemic cardiomyopathy; LBBB (left bundle  branch block); Orthostatic hypotension; Respiratory infection; TIA (transient ischemic attack) (02/2016); and Vitamin D deficiency.  PSH:    Past Surgical History:  Procedure Laterality Date  . ABDOMINAL HYSTERECTOMY    . BASAL CELL CARCINOMA EXCISION     Monh's procedure  . BREAST EXCISIONAL BIOPSY Right 30+yrs ago   positive  . CARDIAC CATHETERIZATION  2014  . CARDIAC CATHETERIZATION N/A 04/24/2016   Procedure: Left Heart Cath and Coronary Angiography;  Surgeon: Wellington Hampshire, MD;  Location: [place] [facility];  Service: Cardiovascular;  Laterality: N/A;  . CARDIAC CATHETERIZATION N/A 04/25/2016   Procedure: Coronary Stent Intervention;  Surgeon: Wellington Hampshire, MD;  Location: [place] [facility];  Service: Cardiovascular;  Laterality: N/A;  . CARDIAC DEFIBRILLATOR PLACEMENT    . EP IMPLANTABLE DEVICE N/A 04/05/2015   Procedure: BIV ICD Generator Changeout;  Surgeon: Deboraha Sprang, MD;  Location: [place] [facility];  Service: Cardiovascular;  Laterality: N/A;  . INSERT / REPLACE / REMOVE PACEMAKER    . MASTECTOMY      Current Outpatient Prescriptions  Medication Sig Dispense Refill  . acetaminophen (TYLENOL) 325 MG tablet Take 325 mg by mouth every 6 (six) hours as needed.    Marland Kitchen alum & mag hydroxide-simeth (MAG-AL PLUS XS) [id number] MG/5ML suspension Take by mouth every 6 (six) hours as needed for indigestion.    Marland Kitchen aspirin 81 MG chewable tablet Chew 1 tablet (81 mg total) by mouth daily. 30 tablet 2  . Cholecalciferol 2000 units CAPS Take 1 capsule by mouth daily.    . clopidogrel (PLAVIX) 75 MG tablet Take 1 tablet (75 mg total) by mouth daily. 30 tablet 2  . Fluticasone-Salmeterol (ADVAIR DISKUS) 250-50 MCG/DOSE AEPB Inhale 1 puff into the lungs 2 (two) times daily. (Patient taking differently: Inhale 1 puff into the lungs daily. ) 60 each 1  . furosemide (LASIX) 20 MG tablet Take 1 tablet (20 mg total) by mouth daily. 30 tablet 0  . levothyroxine (SYNTHROID, LEVOTHROID) 75 MCG  tablet Take 75 mcg by mouth daily before breakfast.     . LORazepam (ATIVAN) 1 MG tablet Take 1 tablet (1 mg total) by mouth at bedtime as needed for anxiety or sleep. 15 tablet 0  . metoprolol succinate (TOPROL-XL) 25 MG 24 hr tablet Take 25 mg by mouth daily.    . midodrine (PROAMATINE) 2.5 MG tablet Take 1 tablet (2.5 mg total) by mouth 2 (two) times daily with a meal. 60 tablet 6  . Multiple Vitamins-Minerals (MULTIVITAMIN GUMMIES ADULT PO) Take 1 each by mouth daily at 12 noon.     . pravastatin (PRAVACHOL) 10 MG tablet Take 10 mg by mouth at bedtime.   0   No current facility-administered medications for this visit.      Allergies:   Ciprofloxacin; Ace inhibitors; Amlodipine-atorvastatin; Atorvastatin; Amoxicillin; and Quinolones   Social History:  The patient  reports that she has quit smoking. She has never used smokeless tobacco. She reports that she does not drink alcohol or use drugs.   Family History:   family history includes Cancer in her other; Coronary artery disease in her other; Hypertension in her father, mother, and other; Stroke in her father and mother.    Review of Systems: Review of Systems  Constitutional: Negative.   Respiratory: Negative.        Shortness of breath on exertion  Cardiovascular: Negative.   Gastrointestinal: Negative.  Musculoskeletal: Negative.        Mild gait instability  Neurological: Negative.   Psychiatric/Behavioral: Negative.   All other systems reviewed and are negative.    PHYSICAL EXAM: VS:  BP (!) 148/72 (BP Location: Left Arm, Patient Position: Sitting, Cuff Size: Normal)   Pulse 79   Ht 5\' 2"  (1.575 m)   Wt 142 lb (64.4 kg)   BMI 25.97 kg/m  , BMI Body mass index is 25.97 kg/m. GEN: Well nourished, well developed, in no acute distress  HEENT: normal  Neck: no JVD, carotid bruits, or masses Cardiac: RRR; no murmurs, rubs, or gallops,no edema  Respiratory:  clear to auscultation bilaterally, normal work of  breathing GI: soft, nontender, nondistended, + BS MS: no deformity or atrophy  Skin: warm and dry, no rash Neuro:  Strength and sensation are intact Psych: euthymic mood, full affect    Recent Labs: 03/13/2016: B Natriuretic Peptide 557.0 06/16/2016: ALT 18; BUN 23; Creatinine, Ser 1.20; Hemoglobin 11.9; Magnesium 2.1; Platelets 217; Potassium 4.1; Sodium 133; TSH 1.231    Lipid Panel Lab Results  Component Value Date   CHOL 149 03/13/2016   HDL 45 03/13/2016   LDLCALC 81 03/13/2016   TRIG 115 03/13/2016      Wt Readings from Last 3 Encounters:  07/08/16 142 lb (64.4 kg)  06/24/16 137 lb 12 oz (62.5 kg)  05/28/16 135 lb 8 oz (61.5 kg)       ASSESSMENT AND PLAN:  HYPERLIPIDEMIA, MIXED Cholesterol is at goal on the current lipid regimen. No changes to the medications were made.  Atherosclerosis of native coronary artery of native heart without angina pectoris Currently with no symptoms of angina. No further workup at this time. Continue current medication regimen.  Orthostatic hypotension Denies any symptoms of dizziness on standing. Tolerating beta blocker, Lasix, on midodrine  Nonischemic cardiomyopathy (HCC) Previous ejection fraction 40%. Appears relatively euvolemic ICD followed by Dr. Caryl Comes  Chronic systolic heart failure [facility]) Currently on Lasix, beta blocker Little room on her blood pressure to add entresto/ACE/ARB given prior orthostasis  Weakness generalized Dramatic improvement in her leg strength, respiratory status with 2 months at [facility] No ambulate without assistance, no recent falls  Frequent falls Daughter also confirms dramatic improvements in her current state, previously was having falls. No recent incidents at [facility] rehabilitation   Total encounter time more than 25 minutes  Greater than 50% was spent in counseling and coordination of care with the patient   Disposition:   F/U  6 months   Signed, Esmond Plants, M.D.,  Ph.D. 07/08/2016  [place], [place]

## 2016-07-09 ENCOUNTER — Non-Acute Institutional Stay (SKILLED_NURSING_FACILITY): Payer: PPO | Admitting: Gerontology

## 2016-07-09 DIAGNOSIS — R000 Tachycardia, unspecified: Secondary | ICD-10-CM | POA: Diagnosis not present

## 2016-07-09 DIAGNOSIS — E871 Hypo-osmolality and hyponatremia: Secondary | ICD-10-CM

## 2016-07-09 DIAGNOSIS — I5022 Chronic systolic (congestive) heart failure: Secondary | ICD-10-CM

## 2016-07-13 NOTE — Progress Notes (Signed)
Location:      Place of Service:  SNF (31) Provider:  Toni Arthurs, NP-C  Kirk Ruths., MD  Patient Care Team: Kirk Ruths, MD as PCP - General (Internal Medicine) Minna Merritts, MD as Consulting Physician (Cardiology) Deboraha Sprang, MD as Consulting Physician (Cardiology)  Extended Emergency Contact Information Primary Emergency Contact: [facility] Address: [address]          [place], [place] [id number] Johnnette Litter of [place] Phone: [phone] Relation: Daughter Secondary Emergency Contact: Genevive Bi [place] of [place] Mobile Phone: [phone] Relation: None  Code Status:  Full Goals of care: Advanced Directive information Advanced Directives 04/24/2016  Does patient have an advance directive? Yes  Type of Advance Directive Living will  Does patient want to make changes to advanced directive? No - Patient declined  Copy of advanced directive(s) in chart? -     Chief Complaint  Patient presents with  . Medical Management of Chronic Issues    HPI:  Pt is a 82 y.o. female seen today for medical management of chronic diseases. She has a h/o chronic hyponatremia. She has been on a liberalized sodium diet with BID Gatorade. Labs were assessed  for improvement. No c/o dizziness, confusion, neuralgias. Pt also has new onset tachycardia. Pt has an extensive cardiac history and is on midodrine for B/P stability. Pt denies chest pain, shortness of breath. Minimal edema in BLE. H/O chronic hyponatremia   Past Medical History:  Diagnosis Date  . Basal cell carcinoma 2011  . Blind left eye   . Breast cancer ([place]) 30+ yrs. ago   right breast, radiation  . COPD (chronic obstructive pulmonary disease) ([place])   . Coronary artery disease    a. s/p prior stent-RCA; b. cath 6/14: mild to mod prox to mid LAD dz, mod prox to dRCA at 60% unchanged from prior cath; c. nuc 3/16: no sig ischemia, nl WM, EF 19%; d. nuc 6/17: intermediate  risk; e. cath 6/17 LM 20%, p-oLAD 20%, ostLCx 30%, ost/pRCA 90% s/p PCI/DES, dRCA 80% s/p PCI/DES  . Dyslipidemia   . Frequent falls   . History of MI (myocardial infarction)   . Hypertension   . Hypothyroidism   . Ischemic cardiomyopathy    a. s/p Medtronic CRT-D, NYHA II-III at baseline; b. echo 2011: EF 43%, mildly dilated LA, PASP 30mm Hg; c. echo 3/16: EF 35-40%, GR1DD, mild to mod AI, mild MR, PASP 65 mmHg  . LBBB (left bundle branch block)   . Orthostatic hypotension   . Respiratory infection   . TIA (transient ischemic attack) 02/2016   a. negative head CT x 2, unable to have MRI brain given ICD; b. carotid doppler with 0-49% stenosis bilaterally with antegrade flow bilaterally   . Vitamin D deficiency    Past Surgical History:  Procedure Laterality Date  . ABDOMINAL HYSTERECTOMY    . BASAL CELL CARCINOMA EXCISION     Monh's procedure  . BREAST EXCISIONAL BIOPSY Right 30+yrs ago   positive  . CARDIAC CATHETERIZATION  2014  . CARDIAC CATHETERIZATION N/A 04/24/2016   Procedure: Left Heart Cath and Coronary Angiography;  Surgeon: Wellington Hampshire, MD;  Location: [place] [facility];  Service: Cardiovascular;  Laterality: N/A;  . CARDIAC CATHETERIZATION N/A 04/25/2016   Procedure: Coronary Stent Intervention;  Surgeon: Wellington Hampshire, MD;  Location: [place] [facility];  Service: Cardiovascular;  Laterality: N/A;  . CARDIAC DEFIBRILLATOR PLACEMENT    . EP IMPLANTABLE DEVICE N/A 04/05/2015  Procedure: BIV ICD Generator Changeout;  Surgeon: Deboraha Sprang, MD;  Location: [place] [facility];  Service: Cardiovascular;  Laterality: N/A;  . INSERT / REPLACE / REMOVE PACEMAKER    . MASTECTOMY      Allergies  Allergen Reactions  . Ciprofloxacin Anaphylaxis and Hives  . Ace Inhibitors Other (See Comments)    Reaction:  Unknown   . Amlodipine-Atorvastatin Other (See Comments)    Reaction:  Unknown   . Atorvastatin Other (See Comments)    Muscle cramps  . Amoxicillin Itching, Rash  and Other (See Comments)    Has patient had a PCN reaction causing immediate rash, facial/tongue/throat swelling, SOB or lightheadedness with hypotension: No Has patient had a PCN reaction causing severe rash involving mucus membranes or skin necrosis: No Has patient had a PCN reaction that required hospitalization No Has patient had a PCN reaction occurring within the last 10 years: Yes If all of the above answers are "NO", then may proceed with Cephalosporin use.  . Quinolones Itching and Rash      Medication List       Accurate as of 07/09/16 11:59 PM. Always use your most recent med list.          acetaminophen 325 MG tablet Commonly known as:  TYLENOL Take 325 mg by mouth every 6 (six) hours as needed.   aspirin 81 MG chewable tablet Chew 1 tablet (81 mg total) by mouth daily.   Cholecalciferol 2000 units Caps Take 1 capsule by mouth daily.   clopidogrel 75 MG tablet Commonly known as:  PLAVIX Take 1 tablet (75 mg total) by mouth daily.   Fluticasone-Salmeterol 250-50 MCG/DOSE Aepb Commonly known as:  ADVAIR DISKUS Inhale 1 puff into the lungs 2 (two) times daily.   furosemide 20 MG tablet Commonly known as:  LASIX Take 1 tablet (20 mg total) by mouth daily.   levothyroxine 75 MCG tablet Commonly known as:  SYNTHROID, LEVOTHROID Take 75 mcg by mouth daily before breakfast.   LORazepam 1 MG tablet Commonly known as:  ATIVAN Take 1 tablet (1 mg total) by mouth at bedtime as needed for anxiety or sleep.   MAG-AL PLUS XS [id number] MG/5ML suspension Generic drug:  alum & mag hydroxide-simeth Take by mouth every 6 (six) hours as needed for indigestion.   metoprolol succinate 25 MG 24 hr tablet Commonly known as:  TOPROL-XL Take 25 mg by mouth daily.   midodrine 2.5 MG tablet Commonly known as:  PROAMATINE Take 1 tablet (2.5 mg total) by mouth 2 (two) times daily with a meal.   MULTIVITAMIN GUMMIES ADULT PO Take 1 each by mouth daily at 12 noon.     pravastatin 10 MG tablet Commonly known as:  PRAVACHOL Take 10 mg by mouth at bedtime.       Review of Systems  Constitutional: Negative for activity change, appetite change, chills, diaphoresis and fever.  HENT: Negative for congestion, sneezing, sore throat, trouble swallowing and voice change.   Eyes: Negative for pain, redness and visual disturbance.  Respiratory: Negative for apnea, cough, choking, chest tightness, shortness of breath and wheezing.   Cardiovascular: Negative for chest pain, palpitations and leg swelling.  Gastrointestinal: Negative for abdominal distention, abdominal pain, constipation, diarrhea and nausea.  Genitourinary: Negative for difficulty urinating, dysuria, frequency and urgency.  Musculoskeletal: Negative for back pain, gait problem and myalgias. Arthralgias: typical arthritis.  Skin: Negative for color change, pallor, rash and wound.  Neurological: Negative for dizziness, tremors, syncope, speech difficulty,  weakness, numbness and headaches.  Psychiatric/Behavioral: Negative for agitation and behavioral problems.  All other systems reviewed and are negative.   There is no immunization history for the selected administration types on file for this patient. Pertinent  Health Maintenance Due  Topic Date Due  . DEXA SCAN  01/31/1998  . PNA vac Low Risk Adult (1 of 2 - PCV13) 01/31/1998  . INFLUENZA VACCINE  06/17/2016   No flowsheet data found. Functional Status Survey:    Vitals:   07/09/16 0500  BP: 106/80  Pulse: 81  Resp: 20  Temp: 97.8 F (36.6 C)  SpO2: 98%  Weight: 139 lb 6.4 oz (63.2 kg)   Body mass index is 25.5 kg/m. Physical Exam  Constitutional: She is oriented to person, place, and time. Vital signs are normal. She appears well-developed and well-nourished. She is active and cooperative. She does not appear ill. No distress.  HENT:  Head: Normocephalic and atraumatic.  Mouth/Throat: Uvula is midline, oropharynx is clear and  moist and mucous membranes are normal. Mucous membranes are not pale, not dry and not cyanotic.  Eyes: Conjunctivae, EOM and lids are normal. Pupils are equal, round, and reactive to light.  Neck: Trachea normal, normal range of motion and full passive range of motion without pain. Neck supple. No JVD present. No tracheal deviation, no edema and no erythema present. No thyromegaly present.  Cardiovascular: Regular rhythm, normal heart sounds, intact distal pulses and normal pulses.  Tachycardia present.  Exam reveals no gallop, no distant heart sounds and no friction rub.   No murmur heard. Pulmonary/Chest: Effort normal and breath sounds normal. No accessory muscle usage. No respiratory distress. She has no wheezes. She exhibits no tenderness.  Abdominal: Normal appearance and bowel sounds are normal. She exhibits no distension and no ascites. There is no tenderness.  Musculoskeletal: Normal range of motion. She exhibits no edema or tenderness.  Expected osteoarthritis, stiffness  Neurological: She is alert and oriented to person, place, and time. She exhibits abnormal muscle tone (weakness). Gait abnormal.  Skin: Skin is warm, dry and intact. She is not diaphoretic. No cyanosis. No pallor. Nails show no clubbing.  Psychiatric: She has a normal mood and affect. Her speech is normal and behavior is normal. Judgment and thought content normal. Cognition and memory are normal.  Nursing note and vitals reviewed.   Labs reviewed:  Recent Labs  04/20/16 1219  05/13/16 0815 05/15/16 0614 06/16/16 0640  NA 136  < > 130* 127* 133*  K 4.5  < > 4.3 4.1 4.1  CL 101  < > 96* 94* 99*  CO2 27  < > 26 28 26   GLUCOSE 109*  < > 97 99 117*  BUN 15  < > 24* 21* 23*  CREATININE 1.24*  < > 1.25* 0.95 1.20*  CALCIUM 8.6*  < > 9.1 8.8* 9.0  MG 2.1  --   --   --  2.1  < > = values in this interval not displayed.  Recent Labs  04/20/16 1219 06/16/16 0640  AST 24 22  ALT 30 18  ALKPHOS 167* 71    BILITOT 0.3 0.6  PROT 6.9 7.2  ALBUMIN 2.6* 3.2*    Recent Labs  05/09/16 0400 05/13/16 0815 06/16/16 0640  WBC 5.4 5.3 5.9  NEUTROABS 2.9 3.3 3.3  HGB 9.9* 10.8* 11.9*  HCT 28.3* 30.5* 33.8*  MCV 90.1 90.8 91.7  PLT 201 199 217   Lab Results  Component Value Date  TSH 1.231 06/16/2016   No results found for: HGBA1C Lab Results  Component Value Date   CHOL 149 03/13/2016   HDL 45 03/13/2016   LDLCALC 81 03/13/2016   TRIG 115 03/13/2016   CHOLHDL 3.3 03/13/2016    Significant Diagnostic Results in last 30 days:  No results found.  Assessment/Plan 1. Tachycardia Metoprolol succinate 25 mg po Q day.   2. Hyponatremia Continue with Regular diet with No Sodium restriction. Gatorade on lunch and supper trays. Monitor sodium levels  3. Chronic systolic congestive heart failure  Continue lasix 20 mg po Q day  Midodrine 2.5 mg po BID  Family/ staff Communication:   Total Time: 25 minutes  Documentation: 15 minutes  Face to Face: 10 minutes  Family/Phone:   Labs/tests ordered:  none  Medication list reviewed and assessed for continued appropriateness. Monthly medication orders signed.   Vikki Ports, NP-C Geriatrics [facility] [id number], [place] [address] Cell Phone (Mon-Fri 8am-5pm):  [phone] On Call:  [phone] & follow prompts after 5pm & weekends Office Phone:  [phone] Office Fax:  [phone]

## 2016-07-14 DIAGNOSIS — F411 Generalized anxiety disorder: Secondary | ICD-10-CM | POA: Diagnosis not present

## 2016-07-18 ENCOUNTER — Non-Acute Institutional Stay (SKILLED_NURSING_FACILITY): Payer: PPO | Admitting: Gerontology

## 2016-07-18 ENCOUNTER — Encounter
Admission: RE | Admit: 2016-07-18 | Discharge: 2016-07-18 | Disposition: A | Discharge status: Home / Self Care | Payer: PPO | Source: Ambulatory Visit | Attending: Internal Medicine | Admitting: Internal Medicine

## 2016-07-18 DIAGNOSIS — I5022 Chronic systolic (congestive) heart failure: Secondary | ICD-10-CM | POA: Diagnosis not present

## 2016-07-18 DIAGNOSIS — I10 Essential (primary) hypertension: Secondary | ICD-10-CM

## 2016-07-27 NOTE — Progress Notes (Signed)
Location:      Place of Service:  SNF (31)  Provider: Toni Arthurs, NP-C  PCP: Kirk Ruths., MD Patient Care Team: Kirk Ruths, MD as PCP - General (Internal Medicine) Minna Merritts, MD as Consulting Physician (Cardiology) Deboraha Sprang, MD as Consulting Physician (Cardiology)  Extended Emergency Contact Information Primary Emergency Contact: [facility] Address: [address]          [place], [place] [id number] Johnnette Litter of [place] Phone: [phone] Relation: Daughter Secondary Emergency Contact: Wisniowski,Mark  [place] Mobile Phone: [phone] Relation: None  Code Status: full Goals of care:  Advanced Directive information Advanced Directives 04/24/2016  Does patient have an advance directive? Yes  Type of Advance Directive Living will  Does patient want to make changes to advanced directive? No - Patient declined  Copy of advanced directive(s) in chart? -     Allergies  Allergen Reactions  . Ciprofloxacin Anaphylaxis and Hives  . Ace Inhibitors Other (See Comments)    Reaction:  Unknown   . Amlodipine-Atorvastatin Other (See Comments)    Reaction:  Unknown   . Atorvastatin Other (See Comments)    Muscle cramps  . Amoxicillin Itching, Rash and Other (See Comments)    Has patient had a PCN reaction causing immediate rash, facial/tongue/throat swelling, SOB or lightheadedness with hypotension: No Has patient had a PCN reaction causing severe rash involving mucus membranes or skin necrosis: No Has patient had a PCN reaction that required hospitalization No Has patient had a PCN reaction occurring within the last 10 years: Yes If all of the above answers are "NO", then may proceed with Cephalosporin use.  . Quinolones Itching and Rash    Chief Complaint  Patient presents with  . Discharge Note    HPI:  82 y.o. female was admitted to the facility as a long term care resident d/t chronic weakness r/t PNA,  CHF, CAD. Pt has progressed well with therapy. Cardiologist recommended pt stay long term. Family wants to try her back at home. Pt has been independent with ambulation and exercises. Pt reports she feels well, feels better than on admission. Says she is ready to go home. VSS    Past Medical History:  Diagnosis Date  . Basal cell carcinoma 2011  . Blind left eye   . Breast cancer ([place]) 30+ yrs. ago   right breast, radiation  . COPD (chronic obstructive pulmonary disease) ([place])   . Coronary artery disease    a. s/p prior stent-RCA; b. cath 6/14: mild to mod prox to mid LAD dz, mod prox to dRCA at 60% unchanged from prior cath; c. nuc 3/16: no sig ischemia, nl WM, EF 19%; d. nuc 6/17: intermediate risk; e. cath 6/17 LM 20%, p-oLAD 20%, ostLCx 30%, ost/pRCA 90% s/p PCI/DES, dRCA 80% s/p PCI/DES  . Dyslipidemia   . Frequent falls   . History of MI (myocardial infarction)   . Hypertension   . Hypothyroidism   . Ischemic cardiomyopathy    a. s/p Medtronic CRT-D, NYHA II-III at baseline; b. echo 2011: EF 43%, mildly dilated LA, PASP 74mm Hg; c. echo 3/16: EF 35-40%, GR1DD, mild to mod AI, mild MR, PASP 65 mmHg  . LBBB (left bundle branch block)   . Orthostatic hypotension   . Respiratory infection   . TIA (transient ischemic attack) 02/2016   a. negative head CT x 2, unable to have MRI brain given ICD; b. carotid doppler with 0-49% stenosis bilaterally with antegrade flow  bilaterally   . Vitamin D deficiency     Past Surgical History:  Procedure Laterality Date  . ABDOMINAL HYSTERECTOMY    . BASAL CELL CARCINOMA EXCISION     Monh's procedure  . BREAST EXCISIONAL BIOPSY Right 30+yrs ago   positive  . CARDIAC CATHETERIZATION  2014  . CARDIAC CATHETERIZATION N/A 04/24/2016   Procedure: Left Heart Cath and Coronary Angiography;  Surgeon: Wellington Hampshire, MD;  Location: [facility];  Service: Cardiovascular;  Laterality: N/A;  . CARDIAC CATHETERIZATION N/A 04/25/2016   Procedure:  Coronary Stent Intervention;  Surgeon: Wellington Hampshire, MD;  Location: [facility];  Service: Cardiovascular;  Laterality: N/A;  . CARDIAC DEFIBRILLATOR PLACEMENT    . EP IMPLANTABLE DEVICE N/A 04/05/2015   Procedure: BIV ICD Generator Changeout;  Surgeon: Deboraha Sprang, MD;  Location: [facility];  Service: Cardiovascular;  Laterality: N/A;  . INSERT / REPLACE / REMOVE PACEMAKER    . MASTECTOMY        reports that she has quit smoking. She has never used smokeless tobacco. She reports that she does not drink alcohol or use drugs. Social History   Social History  . Marital status: Widowed    Spouse name: N/A  . Number of children: N/A  . Years of education: N/A   Occupational History  . Not on file.   Social History Main Topics  . Smoking status: Former Research scientist (life sciences)  . Smokeless tobacco: Never Used  . Alcohol use No  . Drug use: No  . Sexual activity: Not on file   Other Topics Concern  . Not on file   Social History Narrative  . No narrative on file   Functional Status Survey:    Allergies  Allergen Reactions  . Ciprofloxacin Anaphylaxis and Hives  . Ace Inhibitors Other (See Comments)    Reaction:  Unknown   . Amlodipine-Atorvastatin Other (See Comments)    Reaction:  Unknown   . Atorvastatin Other (See Comments)    Muscle cramps  . Amoxicillin Itching, Rash and Other (See Comments)    Has patient had a PCN reaction causing immediate rash, facial/tongue/throat swelling, SOB or lightheadedness with hypotension: No Has patient had a PCN reaction causing severe rash involving mucus membranes or skin necrosis: No Has patient had a PCN reaction that required hospitalization No Has patient had a PCN reaction occurring within the last 10 years: Yes If all of the above answers are "NO", then may proceed with Cephalosporin use.  . Quinolones Itching and Rash    Pertinent  Health Maintenance Due  Topic Date Due  . DEXA SCAN  01/31/1998  . PNA vac Low Risk  Adult (1 of 2 - PCV13) 01/31/1998  . INFLUENZA VACCINE  06/17/2016    Medications:   Medication List       Accurate as of 07/18/16 11:59 PM. Always use your most recent med list.          acetaminophen 325 MG tablet Commonly known as:  TYLENOL Take 325 mg by mouth every 6 (six) hours as needed.   aspirin 81 MG chewable tablet Chew 1 tablet (81 mg total) by mouth daily.   Cholecalciferol 2000 units Caps Take 1 capsule by mouth daily.   clopidogrel 75 MG tablet Commonly known as:  PLAVIX Take 1 tablet (75 mg total) by mouth daily.   Fluticasone-Salmeterol 250-50 MCG/DOSE Aepb Commonly known as:  ADVAIR DISKUS Inhale 1 puff into the lungs 2 (two) times  daily.   furosemide 20 MG tablet Commonly known as:  LASIX Take 1 tablet (20 mg total) by mouth daily.   levothyroxine 75 MCG tablet Commonly known as:  SYNTHROID, LEVOTHROID Take 75 mcg by mouth daily before breakfast.   LORazepam 1 MG tablet Commonly known as:  ATIVAN Take 1 tablet (1 mg total) by mouth at bedtime as needed for anxiety or sleep.   MAG-AL PLUS XS [id number] MG/5ML suspension Generic drug:  alum & mag hydroxide-simeth Take by mouth every 6 (six) hours as needed for indigestion.   metoprolol succinate 25 MG 24 hr tablet Commonly known as:  TOPROL-XL Take 25 mg by mouth daily.   midodrine 2.5 MG tablet Commonly known as:  PROAMATINE Take 1 tablet (2.5 mg total) by mouth 2 (two) times daily with a meal.   MULTIVITAMIN GUMMIES ADULT PO Take 1 each by mouth daily at 12 noon.   pravastatin 10 MG tablet Commonly known as:  PRAVACHOL Take 10 mg by mouth at bedtime.       Review of Systems  Constitutional: Negative for activity change, appetite change, chills, diaphoresis and fever.  HENT: Negative for congestion, sneezing, sore throat, trouble swallowing and voice change.   Eyes: Negative for pain, redness and visual disturbance.  Respiratory: Negative for apnea, cough, choking, chest tightness,  shortness of breath and wheezing.   Cardiovascular: Negative for chest pain, palpitations and leg swelling.  Gastrointestinal: Negative for abdominal distention, abdominal pain, constipation, diarrhea and nausea.  Genitourinary: Negative for difficulty urinating, dysuria, frequency and urgency.  Musculoskeletal: Negative for back pain, gait problem and myalgias. Arthralgias: typical arthritis.  Skin: Negative for color change, pallor, rash and wound.  Neurological: Negative for dizziness, tremors, syncope, speech difficulty, weakness, numbness and headaches.  Psychiatric/Behavioral: Negative for agitation and behavioral problems.  All other systems reviewed and are negative.   Vitals:   07/16/16 0500  BP: 129/61  Pulse: 75  Resp: (!) 22  Temp: 97.5 F (36.4 C)  SpO2: 97%   There is no height or weight on file to calculate BMI. Physical Exam  Constitutional: She is oriented to person, place, and time. Vital signs are normal. She appears well-developed and well-nourished. She is active and cooperative. She does not appear ill. No distress.  HENT:  Head: Normocephalic and atraumatic.  Mouth/Throat: Uvula is midline, oropharynx is clear and moist and mucous membranes are normal. Mucous membranes are not pale, not dry and not cyanotic.  Eyes: Conjunctivae, EOM and lids are normal. Pupils are equal, round, and reactive to light.  Neck: Trachea normal, normal range of motion and full passive range of motion without pain. Neck supple. No JVD present. No tracheal deviation, no edema and no erythema present. No thyromegaly present.  Cardiovascular: Regular rhythm, normal heart sounds, intact distal pulses and normal pulses.  Tachycardia present.  Exam reveals no gallop, no distant heart sounds and no friction rub.   No murmur heard. Pulmonary/Chest: Effort normal and breath sounds normal. No accessory muscle usage. No respiratory distress. She has no wheezes. She exhibits no tenderness.    Abdominal: Normal appearance and bowel sounds are normal. She exhibits no distension and no ascites. There is no tenderness.  Musculoskeletal: Normal range of motion. She exhibits no edema or tenderness.  Expected osteoarthritis, stiffness  Neurological: She is alert and oriented to person, place, and time. She exhibits abnormal muscle tone (weakness). Gait abnormal.  Skin: Skin is warm, dry and intact. She is not diaphoretic. No cyanosis. No pallor.  Nails show no clubbing.  Psychiatric: She has a normal mood and affect. Her speech is normal and behavior is normal. Judgment and thought content normal. Cognition and memory are normal.  Nursing note and vitals reviewed.   Labs reviewed: Basic Metabolic Panel:  Recent Labs  04/20/16 1219  05/13/16 0815 05/15/16 0614 06/16/16 0640  NA 136  < > 130* 127* 133*  K 4.5  < > 4.3 4.1 4.1  CL 101  < > 96* 94* 99*  CO2 27  < > 26 28 26   GLUCOSE 109*  < > 97 99 117*  BUN 15  < > 24* 21* 23*  CREATININE 1.24*  < > 1.25* 0.95 1.20*  CALCIUM 8.6*  < > 9.1 8.8* 9.0  MG 2.1  --   --   --  2.1  < > = values in this interval not displayed. Liver Function Tests:  Recent Labs  04/20/16 1219 06/16/16 0640  AST 24 22  ALT 30 18  ALKPHOS 167* 71  BILITOT 0.3 0.6  PROT 6.9 7.2  ALBUMIN 2.6* 3.2*    Recent Labs  04/20/16 1219  LIPASE 33   No results for input(s): AMMONIA in the last 8760 hours. CBC:  Recent Labs  05/09/16 0400 05/13/16 0815 06/16/16 0640  WBC 5.4 5.3 5.9  NEUTROABS 2.9 3.3 3.3  HGB 9.9* 10.8* 11.9*  HCT 28.3* 30.5* 33.8*  MCV 90.1 90.8 91.7  PLT 201 199 217   Cardiac Enzymes:  Recent Labs  04/20/16 1822 04/21/16 0651 04/21/16 1844  TROPONINI 0.70* 1.21* 0.85*   BNP: Invalid input(s): POCBNP CBG:  Recent Labs  04/25/16 1545  GLUCAP 76    Procedures and Imaging Studies During Stay: No results found.  Assessment/Plan:   1. Essential hypertension 2. Chronic systolic heart failure  ([place])  Follow up with cardiologist regularly  Continue exercised taught by PT/OT  Use rollator/ walker for ambulation   Patient is being discharged with the following home health services:  none  Patient is being discharged with the following durable medical equipment:  none  Patient has been advised to f/u with their PCP in 1-2 weeks to bring them up to date on their rehab stay.  Social services at facility was responsible for arranging this appointment.  Pt was provided with a 30 day supply of prescriptions for medications and refills must be obtained from their PCP.  For controlled substances, a more limited supply may be provided adequate until PCP appointment only.  Future labs/tests needed:  Per pcp  Family/ staff Communication:   Total Time:  Documentation:  Face to Face:  Family/Phone:  Vikki Ports, NP-C [facility] [address],  [address] Cell Phone (Mon-Fri 8am-5pm):  [phone] On Call:  [phone] & follow prompts after 5pm & weekends Office Phone:  [phone] Office Fax:  [phone]

## 2016-07-31 LAB — CUP PACEART INCLINIC DEVICE CHECK
Date Time Interrogation Session: 20170914094310
Implantable Lead Implant Date: 20100115
Implantable Lead Implant Date: 20100115
Implantable Lead Implant Date: 20100115
Implantable Lead Location: 753858
Implantable Lead Location: 753859
Implantable Lead Location: 753860
Implantable Lead Model: 4196
Implantable Lead Model: 5076
Implantable Lead Model: 6947

## 2016-08-17 ENCOUNTER — Encounter: Admission: RE | Admit: 2016-08-17 | Payer: PPO | Source: Ambulatory Visit | Admitting: Internal Medicine

## 2016-08-19 DIAGNOSIS — I10 Essential (primary) hypertension: Secondary | ICD-10-CM | POA: Diagnosis not present

## 2016-08-19 DIAGNOSIS — I2510 Atherosclerotic heart disease of native coronary artery without angina pectoris: Secondary | ICD-10-CM | POA: Diagnosis not present

## 2016-08-19 DIAGNOSIS — I429 Cardiomyopathy, unspecified: Secondary | ICD-10-CM | POA: Diagnosis not present

## 2016-08-19 DIAGNOSIS — R300 Dysuria: Secondary | ICD-10-CM | POA: Diagnosis not present

## 2016-08-19 DIAGNOSIS — Z23 Encounter for immunization: Secondary | ICD-10-CM | POA: Diagnosis not present

## 2016-08-19 DIAGNOSIS — E039 Hypothyroidism, unspecified: Secondary | ICD-10-CM | POA: Diagnosis not present

## 2016-09-02 DIAGNOSIS — J189 Pneumonia, unspecified organism: Secondary | ICD-10-CM | POA: Diagnosis not present

## 2016-09-02 DIAGNOSIS — I10 Essential (primary) hypertension: Secondary | ICD-10-CM | POA: Diagnosis not present

## 2016-09-02 DIAGNOSIS — N390 Urinary tract infection, site not specified: Secondary | ICD-10-CM | POA: Diagnosis not present

## 2016-09-02 DIAGNOSIS — I429 Cardiomyopathy, unspecified: Secondary | ICD-10-CM | POA: Diagnosis not present

## 2016-09-02 DIAGNOSIS — F419 Anxiety disorder, unspecified: Secondary | ICD-10-CM | POA: Diagnosis not present

## 2016-09-14 ENCOUNTER — Other Ambulatory Visit: Payer: Self-pay | Admitting: Physician Assistant

## 2016-09-23 ENCOUNTER — Ambulatory Visit (INDEPENDENT_AMBULATORY_CARE_PROVIDER_SITE_OTHER): Payer: PPO | Admitting: *Deleted

## 2016-09-23 DIAGNOSIS — I428 Other cardiomyopathies: Secondary | ICD-10-CM

## 2016-09-23 DIAGNOSIS — I5022 Chronic systolic (congestive) heart failure: Secondary | ICD-10-CM

## 2016-09-23 NOTE — Progress Notes (Signed)
Remote ICD transmission.   

## 2016-09-24 ENCOUNTER — Encounter: Payer: Self-pay | Admitting: Cardiology

## 2016-10-03 ENCOUNTER — Encounter: Payer: Self-pay | Admitting: Internal Medicine

## 2016-10-04 ENCOUNTER — Emergency Department: Payer: PPO

## 2016-10-04 ENCOUNTER — Emergency Department
Admission: EM | Admit: 2016-10-04 | Discharge: 2016-10-04 | Disposition: A | Discharge status: Home / Self Care | Payer: PPO | Attending: Emergency Medicine | Admitting: Emergency Medicine

## 2016-10-04 ENCOUNTER — Encounter: Payer: Self-pay | Admitting: Emergency Medicine

## 2016-10-04 DIAGNOSIS — I5022 Chronic systolic (congestive) heart failure: Secondary | ICD-10-CM | POA: Insufficient documentation

## 2016-10-04 DIAGNOSIS — I110 Hypertensive heart disease with heart failure: Secondary | ICD-10-CM | POA: Insufficient documentation

## 2016-10-04 DIAGNOSIS — Z79899 Other long term (current) drug therapy: Secondary | ICD-10-CM | POA: Insufficient documentation

## 2016-10-04 DIAGNOSIS — Y999 Unspecified external cause status: Secondary | ICD-10-CM | POA: Diagnosis not present

## 2016-10-04 DIAGNOSIS — Z7982 Long term (current) use of aspirin: Secondary | ICD-10-CM | POA: Insufficient documentation

## 2016-10-04 DIAGNOSIS — S5011XA Contusion of right forearm, initial encounter: Secondary | ICD-10-CM | POA: Diagnosis not present

## 2016-10-04 DIAGNOSIS — Z853 Personal history of malignant neoplasm of breast: Secondary | ICD-10-CM | POA: Diagnosis not present

## 2016-10-04 DIAGNOSIS — I2510 Atherosclerotic heart disease of native coronary artery without angina pectoris: Secondary | ICD-10-CM | POA: Diagnosis not present

## 2016-10-04 DIAGNOSIS — R0602 Shortness of breath: Secondary | ICD-10-CM | POA: Diagnosis not present

## 2016-10-04 DIAGNOSIS — X58XXXA Exposure to other specified factors, initial encounter: Secondary | ICD-10-CM | POA: Insufficient documentation

## 2016-10-04 DIAGNOSIS — I252 Old myocardial infarction: Secondary | ICD-10-CM | POA: Insufficient documentation

## 2016-10-04 DIAGNOSIS — E039 Hypothyroidism, unspecified: Secondary | ICD-10-CM | POA: Diagnosis not present

## 2016-10-04 DIAGNOSIS — Y929 Unspecified place or not applicable: Secondary | ICD-10-CM | POA: Insufficient documentation

## 2016-10-04 DIAGNOSIS — Z791 Long term (current) use of non-steroidal anti-inflammatories (NSAID): Secondary | ICD-10-CM | POA: Insufficient documentation

## 2016-10-04 DIAGNOSIS — Y939 Activity, unspecified: Secondary | ICD-10-CM | POA: Insufficient documentation

## 2016-10-04 DIAGNOSIS — Z87891 Personal history of nicotine dependence: Secondary | ICD-10-CM | POA: Insufficient documentation

## 2016-10-04 DIAGNOSIS — J8410 Pulmonary fibrosis, unspecified: Secondary | ICD-10-CM

## 2016-10-04 DIAGNOSIS — Z95810 Presence of automatic (implantable) cardiac defibrillator: Secondary | ICD-10-CM | POA: Insufficient documentation

## 2016-10-04 DIAGNOSIS — J449 Chronic obstructive pulmonary disease, unspecified: Secondary | ICD-10-CM | POA: Diagnosis not present

## 2016-10-04 DIAGNOSIS — R0600 Dyspnea, unspecified: Secondary | ICD-10-CM

## 2016-10-04 DIAGNOSIS — R05 Cough: Secondary | ICD-10-CM | POA: Diagnosis not present

## 2016-10-04 LAB — COMPREHENSIVE METABOLIC PANEL
ALT: 14 U/L (ref 14–54)
AST: 19 U/L (ref 15–41)
Albumin: 3.3 g/dL — ABNORMAL LOW (ref 3.5–5.0)
Alkaline Phosphatase: 66 U/L (ref 38–126)
Anion gap: 6 (ref 5–15)
BUN: 13 mg/dL (ref 6–20)
CHLORIDE: 102 mmol/L (ref 101–111)
CO2: 25 mmol/L (ref 22–32)
CREATININE: 1.14 mg/dL — AB (ref 0.44–1.00)
Calcium: 8.8 mg/dL — ABNORMAL LOW (ref 8.9–10.3)
GFR, EST AFRICAN AMERICAN: 50 mL/min — AB (ref >60)
GFR, EST NON AFRICAN AMERICAN: 43 mL/min — AB (ref >60)
Glucose, Bld: 98 mg/dL (ref 65–99)
POTASSIUM: 4 mmol/L (ref 3.5–5.1)
SODIUM: 133 mmol/L — AB (ref 135–145)
Total Bilirubin: 0.7 mg/dL (ref 0.3–1.2)
Total Protein: 7.4 g/dL (ref 6.5–8.1)

## 2016-10-04 LAB — CBC
HCT: 35.4 % (ref 35.0–47.0)
HEMOGLOBIN: 12 g/dL (ref 12.0–16.0)
MCH: 30.4 pg (ref 26.0–34.0)
MCHC: 34 g/dL (ref 32.0–36.0)
MCV: 89.4 fL (ref 80.0–100.0)
PLATELETS: 201 10*3/uL (ref 150–440)
RBC: 3.96 MIL/uL (ref 3.80–5.20)
RDW: 15 % — ABNORMAL HIGH (ref 11.5–14.5)
WBC: 6 10*3/uL (ref 3.6–11.0)

## 2016-10-04 LAB — TROPONIN I
TROPONIN I: 0.03 ng/mL — AB (ref <0.03)
TROPONIN I: 0.03 ng/mL — AB (ref <0.03)

## 2016-10-04 LAB — BRAIN NATRIURETIC PEPTIDE: B NATRIURETIC PEPTIDE 5: 403 pg/mL — AB (ref 0.0–100.0)

## 2016-10-04 MED ORDER — PREDNISONE 20 MG PO TABS
60.0000 mg | ORAL_TABLET | Freq: Once | ORAL | Status: AC
  Administered 2016-10-04: 60 mg via ORAL
  Filled 2016-10-04: qty 3

## 2016-10-04 MED ORDER — PREDNISONE 20 MG PO TABS: ORAL_TABLET | ORAL | 0 refills | Status: AC

## 2016-10-04 NOTE — ED Notes (Signed)
Pt alert and oriented X4, active, cooperative, pt in NAD. RR even and unlabored, color WNL.  Pt informed to return if any life threatening symptoms occur.   

## 2016-10-04 NOTE — ED Notes (Signed)
Pt updated on plan of care

## 2016-10-04 NOTE — ED Notes (Signed)
Pt c/o white productive sputum and worsening SOB X 1 week. Pt has hx of CHF and has not had any changes to her "fluid pill" recently. Pt denies CP. SOB worsening with exertion. Pt does not wear oxygen at home. Purple bruising to both left and right forearms. Pt reports that she "slid down" to floor this week from commode, denies hitting head or LOC. Pt alert and oriented X4, active, cooperative, pt in NAD. Color WNL.

## 2016-10-04 NOTE — ED Provider Notes (Signed)
[facility] Emergency Department Provider Note  ____________________________________________   First MD Initiated Contact with Patient 10/04/16 [phone]     (approximate)  I have reviewed the triage vital signs and the nursing notes.   HISTORY  Chief Complaint Shortness of Breath   HPI Erica [name] is a 82 y.o. female with a history of CHF, COPD as well as pulmonary fibrosis was presenting to the emergency department with progressive shortness of breath over several years. However, over the past several weeks the patient says that she is feeling weak and short of breath with short distances of walking. She denies any pain. Denies any cough or fever. Says that she is scheduled to follow up this Tuesday with the pulmonologist, Dr. Raul Del. Says that she has tried multiple inhaled steroid such as Pulmicort and Advair and has had adverse reactions. No known adverse reactions to prednisone or any oral steroids.    Past Medical History:  Diagnosis Date  . Basal cell carcinoma 2011  . Blind left eye   . Breast cancer ([place]) 30+ yrs. ago   right breast, radiation  . CHF (congestive heart failure) ([place])   . COPD (chronic obstructive pulmonary disease) ([place])   . Coronary artery disease    a. s/p prior stent-RCA; b. cath 6/14: mild to mod prox to mid LAD dz, mod prox to dRCA at 60% unchanged from prior cath; c. nuc 3/16: no sig ischemia, nl WM, EF 19%; d. nuc 6/17: intermediate risk; e. cath 6/17 LM 20%, p-oLAD 20%, ostLCx 30%, ost/pRCA 90% s/p PCI/DES, dRCA 80% s/p PCI/DES  . Dyslipidemia   . Frequent falls   . History of MI (myocardial infarction)   . Hypertension   . Hypothyroidism   . Ischemic cardiomyopathy    a. s/p Medtronic CRT-D, NYHA II-III at baseline; b. echo 2011: EF 43%, mildly dilated LA, PASP 49mm Hg; c. echo 3/16: EF 35-40%, GR1DD, mild to mod AI, mild MR, PASP 65 mmHg  . LBBB (left bundle branch block)   . Orthostatic hypotension   . Respiratory  infection   . TIA (transient ischemic attack) 02/2016   a. negative head CT x 2, unable to have MRI brain given ICD; b. carotid doppler with 0-49% stenosis bilaterally with antegrade flow bilaterally   . Vitamin D deficiency     Patient Active Problem List   Diagnosis Date Noted  . Tachycardia 06/14/2016  . Chronic systolic congestive heart failure ([place])   . Dizziness 04/21/2016  . NSTEMI (non-ST elevated myocardial infarction) ([place]) 04/20/2016  . Hypothyroidism 04/10/2016  . COPD (chronic obstructive pulmonary disease) ([place]) 04/10/2016  . UTI (lower urinary tract infection) 04/10/2016  . Hyponatremia 04/10/2016  . Blind left eye   . Frequent falls   . LBBB (left bundle branch block)   . TIA (transient ischemic attack)   . Leg weakness, bilateral   . CVA (cerebral infarction) 03/12/2016  . Gait instability 01/08/2016  . Chronic systolic heart failure ([place]) 02/02/2015  . Weakness generalized 01/18/2014  . Acute bronchitis 12/28/2013  . Nonischemic cardiomyopathy ([place]) 12/28/2013  . Orthostatic hypotension 04/06/2012  . Biventricular implantable cardioverter-defibrillator in situ 04/04/2011  . HYPERLIPIDEMIA, MIXED 10/23/2009  . Essential hypertension 10/23/2009  . Coronary atherosclerosis 10/23/2009    Past Surgical History:  Procedure Laterality Date  . ABDOMINAL HYSTERECTOMY    . BASAL CELL CARCINOMA EXCISION     Monh's procedure  . BREAST EXCISIONAL BIOPSY Right 30+yrs ago   positive  . CARDIAC CATHETERIZATION  2014  . CARDIAC CATHETERIZATION N/A 04/24/2016   Procedure: Left Heart Cath and Coronary Angiography;  Surgeon: Wellington Hampshire, MD;  Location: [place] [facility];  Service: Cardiovascular;  Laterality: N/A;  . CARDIAC CATHETERIZATION N/A 04/25/2016   Procedure: Coronary Stent Intervention;  Surgeon: Wellington Hampshire, MD;  Location: [place] [facility];  Service: Cardiovascular;  Laterality: N/A;  . CARDIAC DEFIBRILLATOR PLACEMENT    . EP IMPLANTABLE DEVICE N/A  04/05/2015   Procedure: BIV ICD Generator Changeout;  Surgeon: Deboraha Sprang, MD;  Location: [place] [facility];  Service: Cardiovascular;  Laterality: N/A;  . INSERT / REPLACE / REMOVE PACEMAKER    . MASTECTOMY      Prior to Admission medications   Medication Sig Start Date End Date Taking? Authorizing Provider  acetaminophen (TYLENOL) 325 MG tablet Take 325 mg by mouth every 6 (six) hours as needed.   Yes Historical Provider, MD  aspirin 81 MG chewable tablet Chew 1 tablet (81 mg total) by mouth daily. 03/14/16  Yes Gladstone Lighter, MD  Cholecalciferol 2000 units CAPS Take 1 capsule by mouth daily.   Yes Historical Provider, MD  clopidogrel (PLAVIX) 75 MG tablet TAKE 1 TABLET (75 MG TOTAL) BY MOUTH DAILY. 09/15/16  Yes Minna Merritts, MD  Fluticasone-Salmeterol (ADVAIR DISKUS) 250-50 MCG/DOSE AEPB Inhale 1 puff into the lungs 2 (two) times daily. Patient taking differently: Inhale 1 puff into the lungs daily.  03/14/16  Yes Gladstone Lighter, MD  furosemide (LASIX) 20 MG tablet Take 1 tablet (20 mg total) by mouth daily. Patient taking differently: Take 20 mg by mouth as needed. Takes when weight goes over 3 pounds 04/23/16  Yes Gladstone Lighter, MD  levothyroxine (SYNTHROID, LEVOTHROID) 75 MCG tablet Take 75 mcg by mouth daily before breakfast.    Yes Historical Provider, MD  LORazepam (ATIVAN) 1 MG tablet Take 1 tablet (1 mg total) by mouth at bedtime as needed for anxiety or sleep. 04/23/16  Yes Gladstone Lighter, MD  midodrine (PROAMATINE) 2.5 MG tablet Take 1 tablet (2.5 mg total) by mouth 2 (two) times daily with a meal. 05/28/16  Yes Ryan M Dunn, PA-C  Multiple Vitamins-Minerals (MULTIVITAMIN GUMMIES ADULT PO) Take 1 each by mouth daily at 12 noon.    Yes Historical Provider, MD  pravastatin (PRAVACHOL) 10 MG tablet Take 10 mg by mouth at bedtime.    Yes Historical Provider, MD  alum & mag hydroxide-simeth (MAG-AL PLUS XS) [id number] MG/5ML suspension Take by mouth every 6 (six) hours as  needed for indigestion.    Historical Provider, MD  metoprolol succinate (TOPROL-XL) 25 MG 24 hr tablet Take 25 mg by mouth daily.    Historical Provider, MD    Allergies Ciprofloxacin; Ace inhibitors; Amlodipine-atorvastatin; Atorvastatin; Amoxicillin; and Quinolones  Family History  Problem Relation Age of Onset  . Hypertension Mother   . Stroke Mother   . Hypertension Father   . Stroke Father   . Cancer Other   . Coronary artery disease Other   . Hypertension Other     Social History Social History  Substance Use Topics  . Smoking status: Former Research scientist (life sciences)  . Smokeless tobacco: Never Used  . Alcohol use No    Review of Systems Constitutional: No fever/chills Eyes: No visual changes. ENT: No sore throat. Cardiovascular: Denies chest pain. Respiratory: As above Gastrointestinal: No abdominal pain.  No nausea, no vomiting.  No diarrhea.  No constipation. Genitourinary: Negative for dysuria. Musculoskeletal: Negative for back pain. Skin: Negative for  rash. Neurological: Negative for headaches, focal weakness or numbness.  10-point ROS otherwise negative.  ____________________________________________   PHYSICAL EXAM:  VITAL SIGNS: ED Triage Vitals  Enc Vitals Group     BP 10/04/16 0850 (!) 165/76     Pulse Rate 10/04/16 0850 96     Resp 10/04/16 0850 20     Temp 10/04/16 0850 98 F (36.7 C)     Temp Source 10/04/16 0850 Oral     SpO2 10/04/16 0850 98 %     Weight 10/04/16 0851 135 lb (61.2 kg)     Height 10/04/16 0851 5\' 1"  (1.549 m)     Head Circumference --      Peak Flow --      Pain Score --      Pain Loc --      Pain Edu? --      Excl. in [place]? --     Constitutional: Alert and oriented. Well appearing and in no acute distress. Eyes: Conjunctivae are normal. PERRL. EOMI. Head: Atraumatic. Nose: No congestion/rhinnorhea. Mouth/Throat: Mucous membranes are moist.   Neck: No stridor.   Cardiovascular: Normal rate, regular rhythm. Grossly normal heart  sounds.  Respiratory: Normal respiratory effort.  No retractions. Rales to the bilateral mid to lower fields. Gastrointestinal: Soft and nontender. No distention.  Musculoskeletal: No lower extremity tenderness nor edema.  No joint effusions. Neurologic:  Normal speech and language. No gross focal neurologic deficits are appreciated.  Skin:  Ecchymosis to the bilateral upper extremities which the patient says is chronic to the left upper extremity but new to the right forearm as well as arm as a result of "lowering herself to the ground from her commode several days ago."  No tenderness to the bilateral proximal wrist. Full range of motion of the joints. Patient is on Plavix. Psychiatric: Mood and affect are normal. Speech and behavior are normal.  ____________________________________________   LABS (all labs ordered are listed, but only abnormal results are displayed)  Labs Reviewed  CBC - Abnormal; Notable for the following:       Result Value   RDW 15.0 (*)    All other components within normal limits  TROPONIN I - Abnormal; Notable for the following:    Troponin I 0.03 (*)    All other components within normal limits  COMPREHENSIVE METABOLIC PANEL - Abnormal; Notable for the following:    Sodium 133 (*)    Creatinine, Ser 1.14 (*)    Calcium 8.8 (*)    Albumin 3.3 (*)    GFR calc non Af Amer 43 (*)    GFR calc Af Amer 50 (*)    All other components within normal limits  BRAIN NATRIURETIC PEPTIDE - Abnormal; Notable for the following:    B Natriuretic Peptide 403.0 (*)    All other components within normal limits   ____________________________________________  EKG  ED ECG REPORT I, Doran Stabler, the attending physician, personally viewed and interpreted this ECG.   Date: 10/04/2016  EKG Time: 0856  Rate: 102  Rhythm: Atrial sensed ventricular pacing.  Axis: Normal  Intervals:Wide-complex QRS consistent with ventricular pacing.  ST&T Change: No ST segment  elevation or depression. T-wave inversion in aVL which appears new from previous.  ____________________________________________  [id number]  DG Chest 1 View (Final result)  Result time 10/04/16 09:12:55  Final result by Rolm Baptise, MD (10/04/16 09:12:55)           Narrative:   CLINICAL DATA: Productive cough  from last week.  EXAM: CHEST 1 VIEW  COMPARISON: 04/20/2016  FINDINGS: Mild cardiomegaly. Left AICD remains in place, unchanged. Elevation of the right hemidiaphragm, stable. Mild interstitial prominence throughout the lungs, slightly decreased since prior study. This may reflect chronic interstitial lung disease. No visible effusions or acute bony abnormality.  IMPRESSION: Cardiomegaly.  Stable elevation of the right hemidiaphragm.  Interstitial prominence, decreased since prior study. This may reflect chronic interstitial lung disease.   Electronically Signed By: Rolm Baptise M.D. On: 10/04/2016 09:12            ____________________________________________   PROCEDURES  Procedure(s) performed:   Procedures  Critical Care performed:   ____________________________________________   INITIAL IMPRESSION / ASSESSMENT AND PLAN / ED COURSE  Pertinent labs & imaging results that were available during my care of the patient were reviewed by me and considered in my medical decision making (see chart for details).   Clinical Course   ----------------------------------------- 12 PM on 10/04/2016 ----------------------------------------- Discussed case with Dr. Doy Mince from cardiology who agrees with second troponin and if it is not elevated from first of the patient will likely be appropriate for discharge home. Dr. Doy Mince will aid with facilitating follow-up appointment. BNP also appears to be at baseline. Also discussed the case with pulmonology's, Dr. Alva Garnet, who recommends a prednisone taper of 60 mg for 3 days then 40 mg for 3 days then  20 mg for 3 days and off.  ----------------------------------------- 1:33 PM on 10/04/2016 -----------------------------------------  Patient is resting comfortably at this time. Stable troponin. Will be discharged home. Likely progression of her pulmonary fibrosis. Following up this Tuesday with Dr. Raul Del of pulmonology. Explained the results as well as the plan to the patient as well as her daughter was at the bedside. Will be discharged home. Patient as well as the daughter understand the plan and are willing to comply.  ____________________________________________   FINAL CLINICAL IMPRESSION(S) / ED DIAGNOSES  Dyspnea.    NEW MEDICATIONS STARTED DURING THIS VISIT:  New Prescriptions   No medications on file     Note:  This document was prepared using Dragon voice recognition software and may include unintentional dictation errors.    Orbie Pyo, MD 10/04/16 [phone]

## 2016-10-04 NOTE — ED Notes (Signed)
Assisted patient to bedside commode

## 2016-10-04 NOTE — ED Triage Notes (Signed)
Increased SOB x 1 week, history of CHF.

## 2016-10-04 NOTE — ED Notes (Signed)
ED Provider at bedside. 

## 2016-10-07 ENCOUNTER — Telehealth: Payer: Self-pay | Admitting: Cardiovascular Disease

## 2016-10-07 ENCOUNTER — Other Ambulatory Visit: Payer: Self-pay | Admitting: Specialist

## 2016-10-07 DIAGNOSIS — R0602 Shortness of breath: Secondary | ICD-10-CM | POA: Diagnosis not present

## 2016-10-07 DIAGNOSIS — R942 Abnormal results of pulmonary function studies: Secondary | ICD-10-CM

## 2016-10-07 DIAGNOSIS — I429 Cardiomyopathy, unspecified: Secondary | ICD-10-CM | POA: Diagnosis not present

## 2016-10-07 DIAGNOSIS — J849 Interstitial pulmonary disease, unspecified: Secondary | ICD-10-CM

## 2016-10-07 DIAGNOSIS — J986 Disorders of diaphragm: Secondary | ICD-10-CM | POA: Diagnosis not present

## 2016-10-07 NOTE — Telephone Encounter (Signed)
I was notified via phone by [place] that pt daughter called and s/w Brunetta Genera as pt c/o SOB. She was seen in ED 11/18 for similar sx. Pt has hx of COPD, CHF and pulmonary fibrosis. She was d/c'd on prednisone per pulmonology, and cleared by cardiology to be d/c'd w/recommendations for f/u. She has been scheduled to see Dr. Rockey Situ November 22 @ 3pm.  Attempted to contact daughter, [name].  Left message to call back.

## 2016-10-07 NOTE — Telephone Encounter (Signed)
Pt c/o Shortness Of Breath: STAT if SOB developed within the last 24 hours or pt is noticeably SOB on the phone  1. Are you currently SOB (can you hear that pt is SOB on the phone)?yes  2. How long have you been experiencing SOB? A while   3. Are you SOB when sitting or when up moving around? Both   4. Are you currently experiencing any other symptoms?no   Please call daughter at [phone]

## 2016-10-08 ENCOUNTER — Telehealth: Payer: Self-pay | Admitting: Cardiovascular Disease

## 2016-10-08 ENCOUNTER — Ambulatory Visit (INDEPENDENT_AMBULATORY_CARE_PROVIDER_SITE_OTHER): Payer: PPO | Admitting: Cardiovascular Disease

## 2016-10-08 ENCOUNTER — Encounter: Payer: Self-pay | Admitting: Cardiovascular Disease

## 2016-10-08 VITALS — BP 140/80 | HR 97 | Ht 61.0 in | Wt 136.2 lb

## 2016-10-08 DIAGNOSIS — I10 Essential (primary) hypertension: Secondary | ICD-10-CM

## 2016-10-08 DIAGNOSIS — I2510 Atherosclerotic heart disease of native coronary artery without angina pectoris: Secondary | ICD-10-CM | POA: Diagnosis not present

## 2016-10-08 DIAGNOSIS — I5023 Acute on chronic systolic (congestive) heart failure: Secondary | ICD-10-CM

## 2016-10-08 DIAGNOSIS — I428 Other cardiomyopathies: Secondary | ICD-10-CM | POA: Diagnosis not present

## 2016-10-08 DIAGNOSIS — R531 Weakness: Secondary | ICD-10-CM

## 2016-10-08 DIAGNOSIS — I447 Left bundle-branch block, unspecified: Secondary | ICD-10-CM

## 2016-10-08 MED ORDER — POTASSIUM CHLORIDE ER 10 MEQ PO TBCR: 10.0000 meq | EXTENDED_RELEASE_TABLET | Freq: Every day | ORAL | 6 refills | Status: DC

## 2016-10-08 NOTE — Telephone Encounter (Signed)
Pt daughter, Lattie Haw, called to confirm pt appt. She would like to make Dr. Rockey Situ aware that pt's breathing "seems to be getting worse over the past 3 weeks". Oxygen level is "ok" but wheezing more frequently and she is declining. Pt had OV w/Dr. Raul Del @ [facility] yesterday; CT chest and sniff test ordered. Notes in Care Everywhere.  Will make Dr. Rockey Situ aware.

## 2016-10-08 NOTE — Patient Instructions (Addendum)
Medication Instructions:   Please lasix twice a day for three days Then down to one a day  Take potassium daily  Labwork:  No new labs needed  Testing/Procedures:  No further testing at this time   I recommend watching educational videos on topics of interest to you at:       [email or web address]  Enter code: [email or web address]    Follow-Up: It was a pleasure seeing you in the office today. Please call us if you have new issues that need to be addressed before your next appt.  [phone]  Your physician wants you to follow-up in: 1 month    If you need a refill on your cardiac medications before your next appointment, please call your pharmacy.

## 2016-10-08 NOTE — Progress Notes (Signed)
Cardiology Office Note  Date:  10/08/2016   ID:  Juleesa, Pavel July 13, 1931, MRN [id number]  PCP:  Kirk Ruths., MD   Chief Complaint  Patient presents with  . other    Follow up from [facility] ER; shortness of breath. Meds reviewed by the pt. verbally. Pt. c/o shortness of breath and anxiety.     HPI:  Erica [name] is a 82 year old woman with nonischemic cardiomyopathy CRT-D that was implanted at [facility]  with class 2-3 congestive heart failure with left bundle branch block,Ejection fraction 35%, coronary artery disease with stenting to her proximal and distal RCA 04/25/2016, hyperlipidemia, hypertension    her husband passed away earlier in February 25, 2012. She presents for her nonischemic cardiomyopathy and for worsening shortness of breath with cough Previous x-rays dating back several years suggesting chronic interstitial disease Previous issues with orthostatic hypotension, previously on midodrine  In follow-up today she reports having worsening SOB 3 to 4 weeks She was discharged from nursing facility, living with children Diet and salt intake, fluid intake has been less rigid  10/04/2016 went to the emergency room for worsening shortness of breath and cough Was given prednisone taper Does not feel that prednisone is helping her symptoms Cough has persisted Seen this morning by pulmonary and referred here for further evaluation  Son-in-law presents with her, feel she is very anxious Breathing is labored, unable to walk very far secondary to shortness of breath  EKG on today's visit shows paced rhythm rate 97 bpm  Other past medical history reviewed Previously presented to hospital hospital in June 2017 from [facility] with symptoms of dizziness, syncope. She had not been taking midodrine. Troponin was elevated at 1.0, Evidence of pneumonia on CT scan Stress test June 8 showed partially reversible defect, underwent catheterization showing severe one-vessel coronary disease  proximal mid and distal RCA, prior to previously placed stent. She underwent successful stent placement to the ostial/proximal and distal RCA regions  EKG on today's visit shows paced rhythm, rate 88 bpm  Other past medical history reviewed  Recent BIV ICD placement by Dr. Caryl Comes.  Prior ejection fraction February 25, 2015 showing ejection fraction 35-40%, right ventricular systolic pressure 65 mmHg  Previous look at her ejection fraction over the past 8-10 years shows a history of depressed ejection fraction around 40% Echocardiogram done at [facility] in 02/25/07 documented EF of 40% Echocardiogram February 24, 2010 done at [facility] documented EF of 40% Most recent echocardiogram in 02-25-2015 documents ejection fraction 35-40%. This echocardiogram did note moderate to severely elevated right ventricular systolic pressures estimated at 65 mmHg.   high right heart pressures on  Echocardiogram, started on Lasix at that time  Cardiac catheterization in late Feb 24, 2013 showed moderate proximal and distal RCA disease estimated at 60%. Also mild to moderate proximal to mid LAD disease. Ejection fraction estimated at greater than 55%. There is no significant change in her RCA lesions compared to 2008-02-25.  distal RCA lesion with moderate stenosis. The lesions were discussed with interventional cardiology and medical management was recommended .  Previous  Echo April 2011 showing mild LV systolic dysfunction, ejection fraction 43%, mildly dilated left atrium, mild pulmonary hypertension with right ventricular systolic pressure of 33  Stress test April 2011 showing normal study with normal ejection fraction.  This was a lexiscan.  cardiac catheterization in January 2008 showing 40% restenosis of mid RCA stent and 50% mid LAD disease. Notes indicate 2 stents in her RCA.  Labs from 12/28/2013 TSH 4.47, BNP 1200  Chest x-ray with bilateral chronic interstitial disease  PMH:   has a past medical history of Basal cell  carcinoma (2011); Blind left eye; Breast cancer ([place]) (30+ yrs. ago); CHF (congestive heart failure) ([place]); COPD (chronic obstructive pulmonary disease) ([place]); Coronary artery disease; Dyslipidemia; Frequent falls; History of MI (myocardial infarction); Hypertension; Hypothyroidism; Ischemic cardiomyopathy; LBBB (left bundle branch block); Orthostatic hypotension; Respiratory infection; TIA (transient ischemic attack) (02/2016); and Vitamin D deficiency.  PSH:    Past Surgical History:  Procedure Laterality Date  . ABDOMINAL HYSTERECTOMY    . BASAL CELL CARCINOMA EXCISION     Monh's procedure  . BREAST EXCISIONAL BIOPSY Right 30+yrs ago   positive  . CARDIAC CATHETERIZATION  2014  . CARDIAC CATHETERIZATION N/A 04/24/2016   Procedure: Left Heart Cath and Coronary Angiography;  Surgeon: Wellington Hampshire, MD;  Location: [place] [facility];  Service: Cardiovascular;  Laterality: N/A;  . CARDIAC CATHETERIZATION N/A 04/25/2016   Procedure: Coronary Stent Intervention;  Surgeon: Wellington Hampshire, MD;  Location: [place] [facility];  Service: Cardiovascular;  Laterality: N/A;  . CARDIAC DEFIBRILLATOR PLACEMENT    . EP IMPLANTABLE DEVICE N/A 04/05/2015   Procedure: BIV ICD Generator Changeout;  Surgeon: Deboraha Sprang, MD;  Location: [place] [facility];  Service: Cardiovascular;  Laterality: N/A;  . INSERT / REPLACE / REMOVE PACEMAKER    . MASTECTOMY      Current Outpatient Prescriptions  Medication Sig Dispense Refill  . acetaminophen (TYLENOL) 325 MG tablet Take 325 mg by mouth every 6 (six) hours as needed.    Marland Kitchen alum & mag hydroxide-simeth (MAG-AL PLUS XS) [id number] MG/5ML suspension Take by mouth every 6 (six) hours as needed for indigestion.    Marland Kitchen aspirin 81 MG chewable tablet Chew 1 tablet (81 mg total) by mouth daily. 30 tablet 2  . Cholecalciferol 2000 units CAPS Take 1 capsule by mouth daily.    . clopidogrel (PLAVIX) 75 MG tablet TAKE 1 TABLET (75 MG TOTAL) BY MOUTH DAILY. 30 tablet 3  .  furosemide (LASIX) 20 MG tablet Take 1 tablet (20 mg total) by mouth daily. (Patient taking differently: Take 20 mg by mouth as needed. Takes when weight goes over 3 pounds) 30 tablet 0  . levothyroxine (SYNTHROID, LEVOTHROID) 75 MCG tablet Take 75 mcg by mouth daily before breakfast.     . LORazepam (ATIVAN) 1 MG tablet Take 1 tablet (1 mg total) by mouth at bedtime as needed for anxiety or sleep. 15 tablet 0  . metoprolol succinate (TOPROL-XL) 25 MG 24 hr tablet Take 25 mg by mouth daily.    . midodrine (PROAMATINE) 2.5 MG tablet Take 1 tablet (2.5 mg total) by mouth 2 (two) times daily with a meal. 60 tablet 6  . Multiple Vitamins-Minerals (MULTIVITAMIN GUMMIES ADULT PO) Take 1 each by mouth daily at 12 noon.     . pravastatin (PRAVACHOL) 10 MG tablet Take 10 mg by mouth at bedtime.   0  . predniSONE (DELTASONE) 20 MG tablet Days 1-3:  Take 3 tabs PO daily Days 4-6:  Take 2 tabs PO daily Days 7-9:  Take 1 tab PO daily 18 tablet 0   No current facility-administered medications for this visit.      Allergies:   Ciprofloxacin; Ace inhibitors; Amlodipine-atorvastatin; Atorvastatin; Amoxicillin; and Quinolones   Social History:  The patient  reports that she has quit smoking. She has never used smokeless tobacco. She reports that she does not drink alcohol or use drugs.  Family History:   family history includes Cancer in her other; Coronary artery disease in her other; Hypertension in her father, mother, and other; Stroke in her father and mother.    Review of Systems: Review of Systems  Constitutional: Negative.   Respiratory: Negative.        Shortness of breath on exertion  Cardiovascular: Negative.   Gastrointestinal: Negative.   Musculoskeletal: Negative.        Mild gait instability  Neurological: Negative.   Psychiatric/Behavioral: Negative.   All other systems reviewed and are negative.    PHYSICAL EXAM: VS:  BP 140/80 (BP Location: Left Arm, Patient Position: Sitting,  Cuff Size: Normal)   Pulse 97   Ht 5\' 1"  (1.549 m)   Wt 136 lb 4 oz (61.8 kg)   BMI 25.74 kg/m  , BMI Body mass index is 25.74 kg/m. GEN: Well nourished, well developed, in no acute distress, frail, presenting a wheelchair  HEENT: normal  Neck: no JVD, carotid bruits, or masses Cardiac: RRR; no murmurs, rubs, or gallops,no edema  Respiratory:  Crackles at the bases bilaterally, normal work of breathing GI: soft, nontender, nondistended, + BS MS: no deformity or atrophy  Skin: warm and dry, no rash Neuro:  Strength and sensation are intact Psych: euthymic mood, full affect    Recent Labs: 06/16/2016: Magnesium 2.1; TSH 1.231 10/04/2016: ALT 14; B Natriuretic Peptide 403.0; BUN 13; Creatinine, Ser 1.14; Hemoglobin 12.0; Platelets 201; Potassium 4.0; Sodium 133    Lipid Panel Lab Results  Component Value Date   CHOL 149 03/13/2016   HDL 45 03/13/2016   LDLCALC 81 03/13/2016   TRIG 115 03/13/2016      Wt Readings from Last 3 Encounters:  10/08/16 136 lb 4 oz (61.8 kg)  10/04/16 135 lb (61.2 kg)  07/09/16 139 lb 6.4 oz (63.2 kg)       ASSESSMENT AND PLAN:  HYPERLIPIDEMIA, MIXED Cholesterol is at goal on the current lipid regimen. No changes to the medications were made.  Atherosclerosis of native coronary artery of native heart without angina pectoris Recent stent placement proximal and distal RCA  Orthostatic hypotension Denies any symptoms of dizziness on standing. Tolerating beta blocker, Lasix, on midodrine  Nonischemic cardiomyopathy (HCC) Previous ejection fraction 35%.  ICD followed by Dr. Caryl Comes  Shortness of breath Infiltrative lung disease on x-ray Most recent acute symptoms likely secondary to acute on chronic systolic CHF She is taking Lasix once every 3 days, high fluid intake, poor diet Recommended Lasix 20 g twice a day for 3 days then Lasix 20 g daily Would take potassium daily Recommended she call us next week if symptoms do not start to  improve Unable to exclude underlying ischemia as a cause of her symptoms  Acute on chronic Chronic systolic heart failure ([place]) Plan as above, restart Lasix on regular basis If blood pressure stays reasonable, potentially could add entresto  Weakness generalized Decline in her stamina since she was discharged home from nursing facility  Frequent falls Legs are getting weaker since she came home from [facility] rehabilitation Recommended she start a regular exercise program at home   Total encounter time more than 45 minutes  Greater than 50% was spent in counseling and coordination of care with the patient  Disposition:   F/U  1 month   Signed, Esmond Plants, M.D., Ph.D. 10/08/2016  [place], [place]

## 2016-10-13 ENCOUNTER — Telehealth: Payer: Self-pay | Admitting: Cardiovascular Disease

## 2016-10-13 NOTE — Telephone Encounter (Signed)
Pt daughter calling stating we sent prescription to wrong place Needs to be sent to [facility] on [address] It was her Potassium  Please advise.

## 2016-10-14 ENCOUNTER — Other Ambulatory Visit: Payer: Self-pay | Admitting: *Deleted

## 2016-10-14 ENCOUNTER — Telehealth: Payer: Self-pay | Admitting: Cardiovascular Disease

## 2016-10-14 MED ORDER — POTASSIUM CHLORIDE ER 10 MEQ PO TBCR: 10.0000 meq | EXTENDED_RELEASE_TABLET | Freq: Every day | ORAL | 6 refills | Status: DC

## 2016-10-14 NOTE — Telephone Encounter (Signed)
Pt daughter calling, states Dr. Ouida Sills will not increase pt Loarazepam. She states Dr. Tonette Bihari office is supposed to be in contact with Dr. Rockey Situ. Please call and advise of status.

## 2016-10-14 NOTE — Telephone Encounter (Signed)
Requested Prescriptions   Signed Prescriptions Disp Refills  . potassium chloride (K-DUR) 10 MEQ tablet 30 tablet 6    Sig: Take 1 tablet (10 mEq total) by mouth daily.    Authorizing Provider: Minna Merritts    Ordering User: Britt Bottom

## 2016-10-14 NOTE — Telephone Encounter (Signed)
Spoke w/ [name].  She reports that pt's PCP, Dr. Ouida Sills, will not increase her Ativan, as "he does not like to give this med to older people b/c it makes them sleepy and increases fall risk". She states that pt needs this to help her relax in the evening and to deal w/ her daily anxiety.  She would like to know if Dr. Rockey Situ can increase this or take over her refills. Advised her that Dr. Rockey Situ is pt's cardiologist and cannot manage her antianxiety meds. Provided her w/ names of local PCPs, as she is interested in switching.  Asked her to call back if we can be of further assistance.

## 2016-10-15 ENCOUNTER — Ambulatory Visit: Payer: PPO | Admitting: Family

## 2016-10-16 ENCOUNTER — Ambulatory Visit
Admission: RE | Admit: 2016-10-16 | Discharge: 2016-10-16 | Disposition: A | Discharge status: Home / Self Care | Payer: PPO | Source: Ambulatory Visit | Attending: Specialist | Admitting: Specialist

## 2016-10-16 DIAGNOSIS — R942 Abnormal results of pulmonary function studies: Secondary | ICD-10-CM | POA: Diagnosis not present

## 2016-10-16 DIAGNOSIS — J986 Disorders of diaphragm: Secondary | ICD-10-CM

## 2016-10-16 DIAGNOSIS — J849 Interstitial pulmonary disease, unspecified: Secondary | ICD-10-CM

## 2016-10-16 DIAGNOSIS — X58XXXA Exposure to other specified factors, initial encounter: Secondary | ICD-10-CM | POA: Diagnosis not present

## 2016-10-16 DIAGNOSIS — E041 Nontoxic single thyroid nodule: Secondary | ICD-10-CM | POA: Insufficient documentation

## 2016-10-16 DIAGNOSIS — I2510 Atherosclerotic heart disease of native coronary artery without angina pectoris: Secondary | ICD-10-CM | POA: Diagnosis not present

## 2016-10-16 DIAGNOSIS — S2231XA Fracture of one rib, right side, initial encounter for closed fracture: Secondary | ICD-10-CM | POA: Diagnosis not present

## 2016-10-16 DIAGNOSIS — I700 Atherosclerosis of aorta: Secondary | ICD-10-CM | POA: Insufficient documentation

## 2016-10-16 DIAGNOSIS — R0602 Shortness of breath: Secondary | ICD-10-CM | POA: Insufficient documentation

## 2016-10-21 LAB — CUP PACEART REMOTE DEVICE CHECK
Battery Remaining Longevity: 100 mo
Battery Voltage: 3.01 V
Brady Statistic AP VP Percent: 0.04 %
Brady Statistic AS VP Percent: 98.75 %
Brady Statistic RV Percent Paced: 18.15 %
HIGH POWER IMPEDANCE MEASURED VALUE: 34 Ohm
HighPow Impedance: 42 Ohm
Implantable Lead Implant Date: 20100115
Implantable Lead Location: 753858
Implantable Lead Location: 753859
Implantable Lead Location: 753860
Implantable Lead Model: 4196
Implantable Lead Model: 5076
Implantable Lead Model: 6947
Lead Channel Impedance Value: 342 Ohm
Lead Channel Impedance Value: 456 Ohm
Lead Channel Impedance Value: 456 Ohm
Lead Channel Impedance Value: 722 Ohm
Lead Channel Pacing Threshold Amplitude: 0.5 V
Lead Channel Pacing Threshold Amplitude: 0.75 V
Lead Channel Pacing Threshold Pulse Width: 0.4 ms
Lead Channel Pacing Threshold Pulse Width: 0.4 ms
Lead Channel Sensing Intrinsic Amplitude: 1 mV
Lead Channel Sensing Intrinsic Amplitude: 11.125 mV
Lead Channel Setting Pacing Amplitude: 1.5 V
Lead Channel Setting Pacing Amplitude: 2 V
Lead Channel Setting Pacing Pulse Width: 0.4 ms
Lead Channel Setting Sensing Sensitivity: 0.3 mV
MDC IDC LEAD IMPLANT DT: 20100115
MDC IDC LEAD IMPLANT DT: 20100115
MDC IDC MSMT LEADCHNL LV IMPEDANCE VALUE: 399 Ohm
MDC IDC MSMT LEADCHNL LV PACING THRESHOLD AMPLITUDE: 1 V
MDC IDC MSMT LEADCHNL LV PACING THRESHOLD PULSEWIDTH: 0.4 ms
MDC IDC MSMT LEADCHNL RA SENSING INTR AMPL: 1 mV
MDC IDC MSMT LEADCHNL RV IMPEDANCE VALUE: 532 Ohm
MDC IDC MSMT LEADCHNL RV SENSING INTR AMPL: 11.125 mV
MDC IDC PG IMPLANT DT: 20160519
MDC IDC SESS DTM: 20171107072824
MDC IDC SET LEADCHNL LV PACING AMPLITUDE: 2 V
MDC IDC SET LEADCHNL RV PACING PULSEWIDTH: 0.4 ms
MDC IDC STAT BRADY AP VS PERCENT: 0.01 %
MDC IDC STAT BRADY AS VS PERCENT: 1.19 %
MDC IDC STAT BRADY RA PERCENT PACED: 0.06 %

## 2016-10-24 ENCOUNTER — Telehealth: Payer: Self-pay | Admitting: Cardiovascular Disease

## 2016-10-24 NOTE — Telephone Encounter (Signed)
Spoke w/ pt.  She reports that she has been having cramps & dizzy spells recently, unsure if they are r/t K+ pills. Advised her that K+ will help w/ her cramps, but he is certain that this is causing them. She would like to know if she can hold these until her appt on Tues. Pt has been taking lasix every other day, but K+ every day as advised at last ov. Advised her that she may hold her K+ on days she is not taking lasix until she follows up w/ [place] and see if her sx improve over the weekend. She is appreciative and will call back w/ any further questions or concerns.

## 2016-10-24 NOTE — Telephone Encounter (Signed)
Pt calling stating we placed her on potassium   And would like to know  She's been having bad cramps along with dizzy spells Has only been on this a week  Wanted to know if she could not take it for the next few days until she comes to see [place] on Tuesdays  Please advise

## 2016-10-28 ENCOUNTER — Ambulatory Visit (INDEPENDENT_AMBULATORY_CARE_PROVIDER_SITE_OTHER): Payer: PPO | Admitting: Cardiovascular Disease

## 2016-10-28 ENCOUNTER — Encounter: Payer: Self-pay | Admitting: Cardiovascular Disease

## 2016-10-28 VITALS — BP 151/92 | HR 100 | Ht 61.0 in | Wt 137.5 lb

## 2016-10-28 DIAGNOSIS — R42 Dizziness and giddiness: Secondary | ICD-10-CM

## 2016-10-28 DIAGNOSIS — I951 Orthostatic hypotension: Secondary | ICD-10-CM

## 2016-10-28 DIAGNOSIS — R05 Cough: Secondary | ICD-10-CM | POA: Diagnosis not present

## 2016-10-28 DIAGNOSIS — I447 Left bundle-branch block, unspecified: Secondary | ICD-10-CM | POA: Diagnosis not present

## 2016-10-28 DIAGNOSIS — I5022 Chronic systolic (congestive) heart failure: Secondary | ICD-10-CM

## 2016-10-28 DIAGNOSIS — I2510 Atherosclerotic heart disease of native coronary artery without angina pectoris: Secondary | ICD-10-CM

## 2016-10-28 DIAGNOSIS — J849 Interstitial pulmonary disease, unspecified: Secondary | ICD-10-CM

## 2016-10-28 DIAGNOSIS — I429 Cardiomyopathy, unspecified: Secondary | ICD-10-CM | POA: Diagnosis not present

## 2016-10-28 DIAGNOSIS — R0609 Other forms of dyspnea: Secondary | ICD-10-CM | POA: Diagnosis not present

## 2016-10-28 NOTE — Progress Notes (Signed)
Cardiology Office Note  Date:  10/28/2016   ID:  Erica, [name] 1931-04-21, MRN [id number]  PCP:  Kirk Ruths., MD   Chief Complaint  Patient presents with  . OTHER    C/o cramps and dizziness. Pt would like to discuss potassium. Meds reviewed verbally with pt.    HPI:  Erica [name] is a 82 year old woman with nonischemic cardiomyopathy CRT-D that was implanted at [facility] with class 2-3 congestive heart failure with left bundle branch block,coronary artery disease with stenting to her proximal and distal RCA 04/25/2016, hyperlipidemia, hypertension   her husband passed away earlier in 04-Mar-2012. She presents for her nonischemic cardiomyopathy and for worsening shortness of breath with cough. Ejection fraction 30-35% Previous x-rays dating back several years suggesting chronic interstitial disease Previous issues with orthostatic hypotension, on midodrine  On her last clinic visit, we had recommended she take more Lasix for weight gain, shortness of breath. She is unable to tolerate more Lasix secondary to cramping She had follow-up with Dr. Raul Del, pulmonary had CT scan chest showing interstitial lung disease She has follow-up with him today to discuss findings  She reports episodes of dizziness if she is too active and hurries Symptoms of dizziness may be better with food Denies any orthostasis symptoms, taking midodrine 2.5 mg twice a day Orthostatic numbers checked today, drop of 30 points to her systolic pressure with standing from [id number] systolic down to [id number], heart rate up 96 up to 108 On the midodrine she has not had any near syncope or syncope episodes She does report blood pressure has been running mildly high  Currently Taking lasix every other day Taking potassium daily for some reason SOB is not good at baseline, symptomatic if she tries to rush and due to much For some reason she stopped metoprolol  CT chest, report reviewed with her today lungs is compatible with  interstitial lung disease, and the spectrum of findings is considered diagnostic from an imaging standpoint of usual interstitial pneumonia (UIP),   Also noted to have 2.4 x 1.8 cm thyroid nodule in the left lobe of the thyroid gland.  EKG on today's visit shows sinus tachycardia rate 100 bpm, paced rhythm  Other past medical history reviewed 10/04/2016 went to the emergency room for worsening shortness of breath and cough Was given prednisone taper  Son-in-law presents with her, feel she is very anxious Breathing is labored, unable to walk very far secondary to shortness of breath  EKG on today's visit shows paced rhythm rate 97 bpm  Other past medical history reviewed Previously presented to hospital hospital in June 2017 from [facility] with symptoms of dizziness, syncope. She had not been taking midodrine. Troponin was elevated at 1.0, Evidence of pneumonia on CT scan Stress test June 8 showed partially reversible defect, underwent catheterization showing severe one-vessel coronary disease proximal mid and distal RCA, prior to previously placed stent. She underwent successful stent placement to the ostial/proximal and distal RCA regions  Recent BIV ICD placement by Dr. Caryl Comes.  Prior ejection fraction 2015/03/05 showing ejection fraction 35-40%, right ventricular systolic pressure 65 mmHg  Previous look at her ejection fraction over the past 8-10 years shows a history of depressed ejection fraction around 40% Echocardiogram done at [facility] in 2007/03/05 documented EF of 40% Echocardiogram Mar 04, 2010 done at [facility] documented EF of 40% Most recent echocardiogram in March 05, 2015 documents ejection fraction 35-40%. This echocardiogram did note moderate to severely elevated right ventricular systolic pressures estimated at 65  mmHg.   high right heart pressures on  Echocardiogram, started on Lasix at that time  Cardiac catheterization in late 2014 showed moderate  proximal and distal RCA disease estimated at 60%. Also mild to moderate proximal to mid LAD disease. Ejection fraction estimated at greater than 55%. There is no significant change in her RCA lesions compared to 2009. distal RCA lesion with moderate stenosis. The lesions were discussed with interventional cardiology and medical management was recommended .  Previous Echo April 2011 showing mild LV systolic dysfunction, ejection fraction 43%, mildly dilated left atrium, mild pulmonary hypertension with right ventricular systolic pressure of 33  Stress test April 2011 showing normal study with normal ejection fraction. This was a lexiscan. cardiac catheterization in January 2008 showing 40% restenosis of mid RCA stent and 50% mid LAD disease. Notes indicate 2 stents in her RCA.  Labs from 12/28/2013 TSH 4.47, BNP 1200 Chest x-ray with bilateral chronic interstitial disease   PMH:   has a past medical history of Basal cell carcinoma (2011); Blind left eye; Breast cancer ([place]) (30+ yrs. ago); CHF (congestive heart failure) ([place]); COPD (chronic obstructive pulmonary disease) ([place]); Coronary artery disease; Dyslipidemia; Frequent falls; History of MI (myocardial infarction); Hypertension; Hypothyroidism; Ischemic cardiomyopathy; LBBB (left bundle branch block); Orthostatic hypotension; Respiratory infection; TIA (transient ischemic attack) (02/2016); and Vitamin D deficiency.  PSH:    Past Surgical History:  Procedure Laterality Date  . ABDOMINAL HYSTERECTOMY    . BASAL CELL CARCINOMA EXCISION     Monh's procedure  . BREAST EXCISIONAL BIOPSY Right 30+yrs ago   positive  . CARDIAC CATHETERIZATION  2014  . CARDIAC CATHETERIZATION N/A 04/24/2016   Procedure: Left Heart Cath and Coronary Angiography;  Surgeon: Wellington Hampshire, MD;  Location: [place] [facility];  Service: Cardiovascular;  Laterality: N/A;  . CARDIAC CATHETERIZATION N/A 04/25/2016   Procedure: Coronary Stent Intervention;   Surgeon: Wellington Hampshire, MD;  Location: [place] [facility];  Service: Cardiovascular;  Laterality: N/A;  . CARDIAC DEFIBRILLATOR PLACEMENT    . EP IMPLANTABLE DEVICE N/A 04/05/2015   Procedure: BIV ICD Generator Changeout;  Surgeon: Deboraha Sprang, MD;  Location: [place] [facility];  Service: Cardiovascular;  Laterality: N/A;  . INSERT / REPLACE / REMOVE PACEMAKER    . MASTECTOMY      Current Outpatient Prescriptions  Medication Sig Dispense Refill  . acetaminophen (TYLENOL) 325 MG tablet Take 325 mg by mouth every 6 (six) hours as needed.    Marland Kitchen alum & mag hydroxide-simeth (MAG-AL PLUS XS) [id number] MG/5ML suspension Take by mouth every 6 (six) hours as needed for indigestion.    Marland Kitchen aspirin 81 MG chewable tablet Chew 1 tablet (81 mg total) by mouth daily. 30 tablet 2  . Cholecalciferol 2000 units CAPS Take 1 capsule by mouth daily.    . clopidogrel (PLAVIX) 75 MG tablet TAKE 1 TABLET (75 MG TOTAL) BY MOUTH DAILY. 30 tablet 3  . furosemide (LASIX) 20 MG tablet Take 1 tablet (20 mg total) by mouth daily. (Patient taking differently: Take 20 mg by mouth as needed. Takes when weight goes over 3 pounds) 30 tablet 0  . levothyroxine (SYNTHROID, LEVOTHROID) 75 MCG tablet Take 75 mcg by mouth daily before breakfast.     . LORazepam (ATIVAN) 1 MG tablet Take 1 tablet (1 mg total) by mouth at bedtime as needed for anxiety or sleep. 15 tablet 0  . midodrine (PROAMATINE) 2.5 MG tablet Take 1 tablet (2.5 mg total) by mouth 2 (  two) times daily with a meal. 60 tablet 6  . Multiple Vitamins-Minerals (MULTIVITAMIN GUMMIES ADULT PO) Take 1 each by mouth daily at 12 noon.     . potassium chloride (K-DUR) 10 MEQ tablet Take 1 tablet (10 mEq total) by mouth daily. 30 tablet 6  . pravastatin (PRAVACHOL) 10 MG tablet Take 10 mg by mouth at bedtime.   0   No current facility-administered medications for this visit.      Allergies:   Ciprofloxacin; Ace inhibitors; Amlodipine-atorvastatin; Atorvastatin;  Amoxicillin; and Quinolones   Social History:  The patient  reports that she has quit smoking. She has never used smokeless tobacco. She reports that she does not drink alcohol or use drugs.   Family History:   family history includes Cancer in her other; Coronary artery disease in her other; Hypertension in her father, mother, and other; Stroke in her father and mother.    Review of Systems: Review of Systems  Constitutional: Negative.   Respiratory: Positive for shortness of breath.   Cardiovascular: Negative.   Gastrointestinal: Negative.   Musculoskeletal: Negative.   Neurological: Positive for dizziness.  Psychiatric/Behavioral: Negative.   All other systems reviewed and are negative.    PHYSICAL EXAM: VS:  BP (!) 151/92 (BP Location: Left Arm, Patient Position: Sitting, Cuff Size: Normal)   Pulse 100   Ht 5\' 1"  (1.549 m)   Wt 137 lb 8 oz (62.4 kg)   BMI 25.98 kg/m  , BMI Body mass index is 25.98 kg/m. GEN: Well nourished, well developed, in no acute distress  HEENT: normal  Neck: no JVD, carotid bruits, or masses Cardiac: RRR; no murmurs, rubs, or gallops,no edema  Respiratory: [place] appreciated bilaterally,normal work of breathing GI: soft, nontender, nondistended, + BS MS: no deformity or atrophy  Skin: warm and dry, no rash Neuro:  Strength and sensation are intact Psych: euthymic mood, full affect    Recent Labs: 06/16/2016: Magnesium 2.1; TSH 1.231 10/04/2016: ALT 14; B Natriuretic Peptide 403.0; BUN 13; Creatinine, Ser 1.14; Hemoglobin 12.0; Platelets 201; Potassium 4.0; Sodium 133    Lipid Panel Lab Results  Component Value Date   CHOL 149 03/13/2016   HDL 45 03/13/2016   LDLCALC 81 03/13/2016   TRIG 115 03/13/2016      Wt Readings from Last 3 Encounters:  10/28/16 137 lb 8 oz (62.4 kg)  10/08/16 136 lb 4 oz (61.8 kg)  10/04/16 135 lb (61.2 kg)       ASSESSMENT AND PLAN:  LBBB (left bundle branch block) - Plan: EKG  12-Lead  Atherosclerosis of native coronary artery of native heart without angina pectoris - Plan: EKG 12-Lead Currently with no symptoms of angina. No further workup at this time. Continue current medication regimen.  Chronic systolic congestive heart failure ([place]) - Plan: EKG 12-Lead Nonischemic cardiomyopathy, has been doing well on her current regiment, Lasix for weight gain with potassium.  Dizziness - Plan: EKG 12-Lead Etiology unclear Not orthostatic on her midodrine Symptoms occurring even at rest  Interstitial lung disease (HCC) CT scan discussed with her in detail, has follow-up with Dr. Junie Panning Concerned that she could be mildly hypovolemic Holding her Lasix leads to CHF symptoms We will have her continue to take Lasix as needed for weight gain, shortness of breath symptoms Continue midodrine 2.5 mill grams twice a day On this regimen she has not had any near syncope or syncope episodes Would let blood pressure run mildly elevated  Long discussion with patient concerning  above, discussed everything with daughter who presents with her today  Total encounter time more than 45 minutes  Greater than 50% was spent in counseling and coordination of care with the patient   Disposition:   F/U  6 months   Orders Placed This Encounter  Procedures  . EKG 12-Lead     Signed, Esmond Plants, M.D., Ph.D. 10/28/2016  [place], [place]

## 2016-10-28 NOTE — Patient Instructions (Addendum)
Dont rush Go slow, Breath, Sit down when dizzy  Medication Instructions:   Take lasix as needed  Labwork:  No new labs needed  Testing/Procedures:  No further testing at this time   I recommend watching educational videos on topics of interest to you at:       [email or web address]  Enter code: HEARTCARE    Follow-Up: It was a pleasure seeing you in the office today. Please call us if you have new issues that need to be addressed before your next appt.  [phone]  Your physician wants you to follow-up in: 3 months.  You will receive a reminder letter in the mail two months in advance. If you don't receive a letter, please call our office to schedule the follow-up appointment.  If you need a refill on your cardiac medications before your next appointment, please call your pharmacy.

## 2016-11-07 ENCOUNTER — Emergency Department: Payer: PPO

## 2016-11-07 ENCOUNTER — Inpatient Hospital Stay
Admission: EM | Admit: 2016-11-07 | Discharge: 2016-11-13 | DRG: 202 | Disposition: A | Discharge status: Hospice | Payer: PPO | Attending: Internal Medicine | Admitting: Internal Medicine

## 2016-11-07 DIAGNOSIS — Z7902 Long term (current) use of antithrombotics/antiplatelets: Secondary | ICD-10-CM

## 2016-11-07 DIAGNOSIS — I255 Ischemic cardiomyopathy: Secondary | ICD-10-CM | POA: Diagnosis not present

## 2016-11-07 DIAGNOSIS — J209 Acute bronchitis, unspecified: Secondary | ICD-10-CM | POA: Diagnosis not present

## 2016-11-07 DIAGNOSIS — Z881 Allergy status to other antibiotic agents status: Secondary | ICD-10-CM

## 2016-11-07 DIAGNOSIS — E039 Hypothyroidism, unspecified: Secondary | ICD-10-CM | POA: Diagnosis present

## 2016-11-07 DIAGNOSIS — J9601 Acute respiratory failure with hypoxia: Secondary | ICD-10-CM | POA: Diagnosis not present

## 2016-11-07 DIAGNOSIS — I252 Old myocardial infarction: Secondary | ICD-10-CM

## 2016-11-07 DIAGNOSIS — I110 Hypertensive heart disease with heart failure: Secondary | ICD-10-CM | POA: Diagnosis present

## 2016-11-07 DIAGNOSIS — E785 Hyperlipidemia, unspecified: Secondary | ICD-10-CM | POA: Diagnosis not present

## 2016-11-07 DIAGNOSIS — Z888 Allergy status to other drugs, medicaments and biological substances status: Secondary | ICD-10-CM

## 2016-11-07 DIAGNOSIS — R042 Hemoptysis: Secondary | ICD-10-CM

## 2016-11-07 DIAGNOSIS — E042 Nontoxic multinodular goiter: Secondary | ICD-10-CM | POA: Diagnosis present

## 2016-11-07 DIAGNOSIS — Z8249 Family history of ischemic heart disease and other diseases of the circulatory system: Secondary | ICD-10-CM

## 2016-11-07 DIAGNOSIS — Z955 Presence of coronary angioplasty implant and graft: Secondary | ICD-10-CM

## 2016-11-07 DIAGNOSIS — Z923 Personal history of irradiation: Secondary | ICD-10-CM

## 2016-11-07 DIAGNOSIS — I248 Other forms of acute ischemic heart disease: Secondary | ICD-10-CM | POA: Diagnosis present

## 2016-11-07 DIAGNOSIS — Z7982 Long term (current) use of aspirin: Secondary | ICD-10-CM

## 2016-11-07 DIAGNOSIS — Z853 Personal history of malignant neoplasm of breast: Secondary | ICD-10-CM

## 2016-11-07 DIAGNOSIS — Z7189 Other specified counseling: Secondary | ICD-10-CM | POA: Diagnosis not present

## 2016-11-07 DIAGNOSIS — Z66 Do not resuscitate: Secondary | ICD-10-CM | POA: Diagnosis not present

## 2016-11-07 DIAGNOSIS — R339 Retention of urine, unspecified: Secondary | ICD-10-CM | POA: Diagnosis not present

## 2016-11-07 DIAGNOSIS — J9621 Acute and chronic respiratory failure with hypoxia: Secondary | ICD-10-CM | POA: Diagnosis present

## 2016-11-07 DIAGNOSIS — J84112 Idiopathic pulmonary fibrosis: Secondary | ICD-10-CM | POA: Diagnosis present

## 2016-11-07 DIAGNOSIS — F410 Panic disorder [episodic paroxysmal anxiety] without agoraphobia: Secondary | ICD-10-CM | POA: Diagnosis not present

## 2016-11-07 DIAGNOSIS — I5022 Chronic systolic (congestive) heart failure: Secondary | ICD-10-CM | POA: Diagnosis present

## 2016-11-07 DIAGNOSIS — J471 Bronchiectasis with (acute) exacerbation: Secondary | ICD-10-CM | POA: Diagnosis not present

## 2016-11-07 DIAGNOSIS — J8410 Pulmonary fibrosis, unspecified: Secondary | ICD-10-CM | POA: Diagnosis not present

## 2016-11-07 DIAGNOSIS — I10 Essential (primary) hypertension: Secondary | ICD-10-CM | POA: Diagnosis not present

## 2016-11-07 DIAGNOSIS — Z87891 Personal history of nicotine dependence: Secondary | ICD-10-CM

## 2016-11-07 DIAGNOSIS — I2510 Atherosclerotic heart disease of native coronary artery without angina pectoris: Secondary | ICD-10-CM | POA: Diagnosis present

## 2016-11-07 DIAGNOSIS — E871 Hypo-osmolality and hyponatremia: Secondary | ICD-10-CM | POA: Diagnosis present

## 2016-11-07 DIAGNOSIS — R0602 Shortness of breath: Secondary | ICD-10-CM | POA: Diagnosis present

## 2016-11-07 DIAGNOSIS — H5462 Unqualified visual loss, left eye, normal vision right eye: Secondary | ICD-10-CM | POA: Diagnosis not present

## 2016-11-07 DIAGNOSIS — T380X5A Adverse effect of glucocorticoids and synthetic analogues, initial encounter: Secondary | ICD-10-CM | POA: Diagnosis not present

## 2016-11-07 DIAGNOSIS — F411 Generalized anxiety disorder: Secondary | ICD-10-CM | POA: Diagnosis not present

## 2016-11-07 DIAGNOSIS — E041 Nontoxic single thyroid nodule: Secondary | ICD-10-CM

## 2016-11-07 DIAGNOSIS — E860 Dehydration: Secondary | ICD-10-CM | POA: Diagnosis not present

## 2016-11-07 DIAGNOSIS — Z9010 Acquired absence of unspecified breast and nipple: Secondary | ICD-10-CM

## 2016-11-07 DIAGNOSIS — Z515 Encounter for palliative care: Secondary | ICD-10-CM | POA: Diagnosis not present

## 2016-11-07 DIAGNOSIS — R2681 Unsteadiness on feet: Secondary | ICD-10-CM

## 2016-11-07 DIAGNOSIS — Z8673 Personal history of transient ischemic attack (TIA), and cerebral infarction without residual deficits: Secondary | ICD-10-CM | POA: Diagnosis not present

## 2016-11-07 DIAGNOSIS — H9190 Unspecified hearing loss, unspecified ear: Secondary | ICD-10-CM | POA: Diagnosis not present

## 2016-11-07 DIAGNOSIS — J9611 Chronic respiratory failure with hypoxia: Secondary | ICD-10-CM | POA: Diagnosis not present

## 2016-11-07 DIAGNOSIS — M6281 Muscle weakness (generalized): Secondary | ICD-10-CM

## 2016-11-07 LAB — CBC WITH DIFFERENTIAL/PLATELET
BASOS ABS: 0 10*3/uL (ref 0–0.1)
Basophils Relative: 1 %
EOS ABS: 0.1 10*3/uL (ref 0–0.7)
Eosinophils Relative: 2 %
HCT: 35.8 % (ref 35.0–47.0)
HEMOGLOBIN: 12.4 g/dL (ref 12.0–16.0)
LYMPHS ABS: 1.3 10*3/uL (ref 1.0–3.6)
LYMPHS PCT: 27 %
MCH: 30.7 pg (ref 26.0–34.0)
MCHC: 34.6 g/dL (ref 32.0–36.0)
MCV: 88.8 fL (ref 80.0–100.0)
Monocytes Absolute: 0.4 10*3/uL (ref 0.2–0.9)
Monocytes Relative: 9 %
NEUTROS PCT: 61 %
Neutro Abs: 2.9 10*3/uL (ref 1.4–6.5)
Platelets: 216 10*3/uL (ref 150–440)
RBC: 4.03 MIL/uL (ref 3.80–5.20)
RDW: 14.8 % — ABNORMAL HIGH (ref 11.5–14.5)
WBC: 4.7 10*3/uL (ref 3.6–11.0)

## 2016-11-07 LAB — BASIC METABOLIC PANEL
ANION GAP: 7 (ref 5–15)
BUN: 18 mg/dL (ref 6–20)
CHLORIDE: 96 mmol/L — AB (ref 101–111)
CO2: 27 mmol/L (ref 22–32)
Calcium: 8.6 mg/dL — ABNORMAL LOW (ref 8.9–10.3)
Creatinine, Ser: 1.18 mg/dL — ABNORMAL HIGH (ref 0.44–1.00)
GFR calc non Af Amer: 41 mL/min — ABNORMAL LOW (ref >60)
GFR, EST AFRICAN AMERICAN: 48 mL/min — AB (ref >60)
Glucose, Bld: 96 mg/dL (ref 65–99)
POTASSIUM: 4.3 mmol/L (ref 3.5–5.1)
SODIUM: 130 mmol/L — AB (ref 135–145)

## 2016-11-07 LAB — INFLUENZA PANEL BY PCR (TYPE A & B)
INFLAPCR: NEGATIVE
INFLBPCR: NEGATIVE

## 2016-11-07 LAB — TROPONIN I
TROPONIN I: 0.03 ng/mL — AB (ref <0.03)
TROPONIN I: 0.03 ng/mL — AB (ref <0.03)

## 2016-11-07 LAB — BRAIN NATRIURETIC PEPTIDE: B NATRIURETIC PEPTIDE 5: 506 pg/mL — AB (ref 0.0–100.0)

## 2016-11-07 MED ORDER — DOXYCYCLINE HYCLATE 100 MG PO TABS
100.0000 mg | ORAL_TABLET | Freq: Once | ORAL | Status: AC
  Administered 2016-11-07: 100 mg via ORAL
  Filled 2016-11-07: qty 1

## 2016-11-07 MED ORDER — VITAMIN D 1000 UNITS PO TABS
2000.0000 [IU] | ORAL_TABLET | Freq: Every day | ORAL | Status: DC
  Administered 2016-11-08 – 2016-11-12 (×5): 2000 [IU] via ORAL
  Filled 2016-11-07 (×5): qty 2

## 2016-11-07 MED ORDER — ONDANSETRON HCL 4 MG/2ML IJ SOLN
4.0000 mg | Freq: Four times a day (QID) | INTRAMUSCULAR | Status: DC | PRN
  Administered 2016-11-09 – 2016-11-12 (×3): 4 mg via INTRAVENOUS
  Filled 2016-11-07 (×3): qty 2

## 2016-11-07 MED ORDER — SODIUM CHLORIDE 0.9% FLUSH
3.0000 mL | Freq: Two times a day (BID) | INTRAVENOUS | Status: DC
  Administered 2016-11-07 – 2016-11-12 (×7): 3 mL via INTRAVENOUS

## 2016-11-07 MED ORDER — CEFTRIAXONE SODIUM-DEXTROSE 1-3.74 GM-% IV SOLR
1.0000 g | Freq: Once | INTRAVENOUS | Status: AC
  Administered 2016-11-07: 1 g via INTRAVENOUS
  Filled 2016-11-07: qty 50

## 2016-11-07 MED ORDER — IPRATROPIUM-ALBUTEROL 0.5-2.5 (3) MG/3ML IN SOLN
3.0000 mL | Freq: Once | RESPIRATORY_TRACT | Status: AC
  Administered 2016-11-07: 3 mL via RESPIRATORY_TRACT

## 2016-11-07 MED ORDER — ACETAMINOPHEN 650 MG RE SUPP: 650.0000 mg | Freq: Four times a day (QID) | RECTAL | Status: DC | PRN

## 2016-11-07 MED ORDER — MIDODRINE HCL 5 MG PO TABS
2.5000 mg | ORAL_TABLET | Freq: Two times a day (BID) | ORAL | Status: DC
  Administered 2016-11-08 – 2016-11-13 (×11): 2.5 mg via ORAL
  Filled 2016-11-07 (×12): qty 1

## 2016-11-07 MED ORDER — METHYLPREDNISOLONE SODIUM SUCC 125 MG IJ SOLR
60.0000 mg | Freq: Two times a day (BID) | INTRAMUSCULAR | Status: DC
  Administered 2016-11-08: 60 mg via INTRAVENOUS
  Filled 2016-11-07: qty 2

## 2016-11-07 MED ORDER — DEXTROSE 5 % IV SOLN
100.0000 mg | Freq: Two times a day (BID) | INTRAVENOUS | Status: DC
  Administered 2016-11-08 – 2016-11-13 (×11): 100 mg via INTRAVENOUS
  Filled 2016-11-07 (×13): qty 100

## 2016-11-07 MED ORDER — ACETAMINOPHEN 325 MG PO TABS
650.0000 mg | ORAL_TABLET | Freq: Four times a day (QID) | ORAL | Status: DC | PRN
  Administered 2016-11-08: 650 mg via ORAL
  Filled 2016-11-07 (×2): qty 2

## 2016-11-07 MED ORDER — ENOXAPARIN SODIUM 40 MG/0.4ML ~~LOC~~ SOLN
40.0000 mg | SUBCUTANEOUS | Status: DC
  Administered 2016-11-07: 40 mg via SUBCUTANEOUS
  Filled 2016-11-07: qty 0.4

## 2016-11-07 MED ORDER — CLOPIDOGREL BISULFATE 75 MG PO TABS
75.0000 mg | ORAL_TABLET | Freq: Every day | ORAL | Status: DC
  Administered 2016-11-08: 75 mg via ORAL
  Filled 2016-11-07: qty 1

## 2016-11-07 MED ORDER — SODIUM CHLORIDE 0.9% FLUSH: 3.0000 mL | INTRAVENOUS | Status: DC | PRN

## 2016-11-07 MED ORDER — AZITHROMYCIN 500 MG PO TABS
500.0000 mg | ORAL_TABLET | Freq: Once | ORAL | Status: DC
  Filled 2016-11-07: qty 1

## 2016-11-07 MED ORDER — POTASSIUM GLUCONATE 550 MG PO TABS: 550.0000 mg | ORAL_TABLET | Freq: Every day | ORAL | Status: DC

## 2016-11-07 MED ORDER — LORAZEPAM 1 MG PO TABS
1.0000 mg | ORAL_TABLET | Freq: Every evening | ORAL | Status: DC | PRN
  Administered 2016-11-07 – 2016-11-08 (×2): 1 mg via ORAL
  Filled 2016-11-07 (×3): qty 1

## 2016-11-07 MED ORDER — IPRATROPIUM-ALBUTEROL 0.5-2.5 (3) MG/3ML IN SOLN
RESPIRATORY_TRACT | Status: AC
  Filled 2016-11-07: qty 9

## 2016-11-07 MED ORDER — GUAIFENESIN ER 600 MG PO TB12
600.0000 mg | ORAL_TABLET | Freq: Two times a day (BID) | ORAL | Status: DC
  Administered 2016-11-07 – 2016-11-12 (×11): 600 mg via ORAL
  Filled 2016-11-07 (×11): qty 1

## 2016-11-07 MED ORDER — POTASSIUM CHLORIDE CRYS ER 10 MEQ PO TBCR
10.0000 meq | EXTENDED_RELEASE_TABLET | Freq: Every day | ORAL | Status: DC
  Administered 2016-11-07 – 2016-11-12 (×6): 10 meq via ORAL
  Filled 2016-11-07 (×11): qty 1

## 2016-11-07 MED ORDER — ASPIRIN 81 MG PO CHEW
81.0000 mg | CHEWABLE_TABLET | Freq: Every day | ORAL | Status: DC
  Administered 2016-11-08: 81 mg via ORAL
  Filled 2016-11-07: qty 1

## 2016-11-07 MED ORDER — FUROSEMIDE 40 MG PO TABS
20.0000 mg | ORAL_TABLET | Freq: Every day | ORAL | Status: DC
  Administered 2016-11-08 – 2016-11-09 (×2): 20 mg via ORAL
  Filled 2016-11-07 (×2): qty 1

## 2016-11-07 MED ORDER — ONDANSETRON HCL 4 MG PO TABS: 4.0000 mg | ORAL_TABLET | Freq: Four times a day (QID) | ORAL | Status: DC | PRN

## 2016-11-07 MED ORDER — SODIUM CHLORIDE 0.9 % IV BOLUS (SEPSIS)
250.0000 mL | Freq: Once | INTRAVENOUS | Status: AC
  Administered 2016-11-07: 250 mL via INTRAVENOUS

## 2016-11-07 MED ORDER — GUAIFENESIN-DM 100-10 MG/5ML PO SYRP
15.0000 mL | ORAL_SOLUTION | ORAL | Status: DC | PRN
  Administered 2016-11-09 – 2016-11-13 (×5): 15 mL via ORAL
  Filled 2016-11-07 (×5): qty 15

## 2016-11-07 MED ORDER — FAMOTIDINE 20 MG PO TABS
20.0000 mg | ORAL_TABLET | Freq: Two times a day (BID) | ORAL | Status: DC
  Administered 2016-11-07: 20 mg via ORAL
  Filled 2016-11-07: qty 1

## 2016-11-07 MED ORDER — DEXTROSE 5 % IV SOLN: 1.0000 g | Freq: Once | INTRAVENOUS | Status: DC

## 2016-11-07 MED ORDER — SODIUM CHLORIDE 0.9 % IV SOLN: 250.0000 mL | INTRAVENOUS | Status: DC | PRN

## 2016-11-07 MED ORDER — LEVOTHYROXINE SODIUM 50 MCG PO TABS
75.0000 ug | ORAL_TABLET | Freq: Every day | ORAL | Status: DC
  Administered 2016-11-08 – 2016-11-13 (×6): 75 ug via ORAL
  Filled 2016-11-07 (×6): qty 2

## 2016-11-07 MED ORDER — ACETAMINOPHEN 325 MG PO TABS: 325.0000 mg | ORAL_TABLET | Freq: Four times a day (QID) | ORAL | Status: DC | PRN

## 2016-11-07 MED ORDER — PRAVASTATIN SODIUM 10 MG PO TABS
10.0000 mg | ORAL_TABLET | Freq: Every day | ORAL | Status: DC
  Administered 2016-11-07 – 2016-11-12 (×6): 10 mg via ORAL
  Filled 2016-11-07 (×6): qty 1

## 2016-11-07 MED ORDER — PREDNISONE 20 MG PO TABS
60.0000 mg | ORAL_TABLET | Freq: Once | ORAL | Status: AC
  Administered 2016-11-07: 60 mg via ORAL
  Filled 2016-11-07: qty 3

## 2016-11-07 NOTE — ED Notes (Signed)
Pt placed on 2L [place]

## 2016-11-07 NOTE — ED Notes (Signed)
This RN assisted patient up to the bathroom.  When patient returned from toilet, she was tachypnic and her oxygen level was 87%.  Oxygen level rose once patient had rested for several minutes.  MD notified.

## 2016-11-07 NOTE — ED Notes (Signed)
CBC collected, R AC, sent to lab.

## 2016-11-07 NOTE — Progress Notes (Signed)
Patient is admitted to room 258 with the diagnosis of SOB. Alert and oriented x 4. Denied any acute pain. Skin assessment done with Sharyn Lull RN, noted echymosis on bil upper extremities. Tele box called to CCMD with jeff as a second verifier.  Patient is a Jehovah Witness per [name] ( daughter)  and do not want any blood transfusion if there's a need. Patient requested for ativan 1 mg  PRN at bedtime and on re-assessment she was resting quietly with her eyes closed, respirations even and unlabored. Will continue to monitor.

## 2016-11-07 NOTE — ED Triage Notes (Signed)
Pt states she has been SOB X 2 weeks ago. Dx with pulmonary fibrosis by PCP last week and prescribed cough syrup. Pt c/o URI symptoms. Pt has a hard time speaking in full and complete sentences.

## 2016-11-07 NOTE — H&P (Signed)
[facility] [place] NAME: Erica [name]    MR#:  [id number]  DATE OF BIRTH:  December 09, 1930  DATE OF ADMISSION:  11/07/2016  PRIMARY CARE PHYSICIAN: Kirk Ruths., MD   REQUESTING/REFERRING PHYSICIAN: Karilyn Cota  CHIEF COMPLAINT:   Chief Complaint  Patient presents with  . Shortness of Breath    HISTORY OF PRESENT ILLNESS: Erica [name]  is a 82 y.o. female with a known history of Congestive heart failure, coronary artery disease, dyslipidemia, hypertension, hypothyroidism and a diagnosis of pulmonary fibrosis last week was presenting with complaint of shortness of breath for the past 3 days. Patient reports that she has had cough and congestion ongoing for the past few days. Which is progressively gotten worse. Patient was brought to the ED. She was ambulated and her oxygen sats dropped. Into the 60s. She denies any chest pain palpitations or fever.  PAST MEDICAL HISTORY:   Past Medical History:  Diagnosis Date  . Basal cell carcinoma 2011  . Blind left eye   . Breast cancer ([place]) 30+ yrs. ago   right breast, radiation  . CHF (congestive heart failure) ([place])   . COPD (chronic obstructive pulmonary disease) ([place])   . Coronary artery disease    a. s/p prior stent-RCA; b. cath 6/14: mild to mod prox to mid LAD dz, mod prox to dRCA at 60% unchanged from prior cath; c. nuc 3/16: no sig ischemia, nl WM, EF 19%; d. nuc 6/17: intermediate risk; e. cath 6/17 LM 20%, p-oLAD 20%, ostLCx 30%, ost/pRCA 90% s/p PCI/DES, dRCA 80% s/p PCI/DES  . Dyslipidemia   . Frequent falls   . History of MI (myocardial infarction)   . Hypertension   . Hypothyroidism   . Ischemic cardiomyopathy    a. s/p Medtronic CRT-D, NYHA II-III at baseline; b. echo 2011: EF 43%, mildly dilated LA, PASP 2mm Hg; c. echo 3/16: EF 35-40%, GR1DD, mild to mod AI, mild MR, PASP 65 mmHg  . LBBB (left bundle branch block)   . Orthostatic hypotension   . Respiratory  infection   . TIA (transient ischemic attack) 02/2016   a. negative head CT x 2, unable to have MRI brain given ICD; b. carotid doppler with 0-49% stenosis bilaterally with antegrade flow bilaterally   . Vitamin D deficiency     PAST SURGICAL HISTORY: Past Surgical History:  Procedure Laterality Date  . ABDOMINAL HYSTERECTOMY    . BASAL CELL CARCINOMA EXCISION     Monh's procedure  . BREAST EXCISIONAL BIOPSY Right 30+yrs ago   positive  . CARDIAC CATHETERIZATION  2014  . CARDIAC CATHETERIZATION N/A 04/24/2016   Procedure: Left Heart Cath and Coronary Angiography;  Surgeon: Wellington Hampshire, MD;  Location: [facility];  Service: Cardiovascular;  Laterality: N/A;  . CARDIAC CATHETERIZATION N/A 04/25/2016   Procedure: Coronary Stent Intervention;  Surgeon: Wellington Hampshire, MD;  Location: [facility];  Service: Cardiovascular;  Laterality: N/A;  . CARDIAC DEFIBRILLATOR PLACEMENT    . EP IMPLANTABLE DEVICE N/A 04/05/2015   Procedure: BIV ICD Generator Changeout;  Surgeon: Deboraha Sprang, MD;  Location: [facility];  Service: Cardiovascular;  Laterality: N/A;  . INSERT / REPLACE / REMOVE PACEMAKER    . MASTECTOMY      SOCIAL HISTORY:  Social History  Substance Use Topics  . Smoking status: Former Research scientist (life sciences)  . Smokeless tobacco: Never Used  . Alcohol use No    FAMILY  HISTORY:  Family History  Problem Relation Age of Onset  . Hypertension Mother   . Stroke Mother   . Hypertension Father   . Stroke Father   . Cancer Other   . Coronary artery disease Other   . Hypertension Other     DRUG ALLERGIES:  Allergies  Allergen Reactions  . Ciprofloxacin Anaphylaxis, Hives and Other (See Comments)    pruritis  . Ace Inhibitors Other (See Comments)    Leg cramps  . Advair Diskus [Fluticasone-Salmeterol] Other (See Comments)    Tongue broke out with bumps  . Amlodipine-Atorvastatin Other (See Comments)    Muscle pain  . Atorvastatin Other (See Comments)    Muscle  cramps  . Cefdinir Other (See Comments)  . Amoxicillin Itching, Rash and Other (See Comments)    Has patient had a PCN reaction causing immediate rash, facial/tongue/throat swelling, SOB or lightheadedness with hypotension: No Has patient had a PCN reaction causing severe rash involving mucus membranes or skin necrosis: No Has patient had a PCN reaction that required hospitalization No Has patient had a PCN reaction occurring within the last 10 years: Yes If all of the above answers are "NO", then may proceed with Cephalosporin use.  . Quinolones Itching and Rash    REVIEW OF SYSTEMS:   CONSTITUTIONAL: No fever, fatigue or weakness.  EYES: No blurred or double vision.  EARS, NOSE, AND THROAT: No tinnitus or ear pain.  RESPIRATORY: Positive cough, positive shortness of breath, positive wheezing no hemoptysis.  CARDIOVASCULAR: No chest pain, orthopnea, edema.  GASTROINTESTINAL: No nausea, vomiting, diarrhea or abdominal pain.  GENITOURINARY: No dysuria, hematuria.  ENDOCRINE: No polyuria, nocturia,  HEMATOLOGY: No anemia, easy bruising or bleeding SKIN: No rash or lesion. MUSCULOSKELETAL: No joint pain or arthritis.   NEUROLOGIC: No tingling, numbness, weakness.  PSYCHIATRY: No anxiety or depression.   MEDICATIONS AT HOME:  Prior to Admission medications   Medication Sig Start Date End Date Taking? Authorizing Provider  aspirin 81 MG chewable tablet Chew 1 tablet (81 mg total) by mouth daily. 03/14/16  Yes Gladstone Lighter, MD  Cholecalciferol 2000 units CAPS Take 1 capsule by mouth daily.   Yes Historical Provider, MD  clopidogrel (PLAVIX) 75 MG tablet TAKE 1 TABLET (75 MG TOTAL) BY MOUTH DAILY. 09/15/16  Yes Minna Merritts, MD  furosemide (LASIX) 20 MG tablet Take 1 tablet (20 mg total) by mouth daily. Patient taking differently: Take 20 mg by mouth as needed. Takes when weight goes over 3 pounds 04/23/16  Yes Gladstone Lighter, MD  levothyroxine (SYNTHROID, LEVOTHROID) 75 MCG  tablet Take 75 mcg by mouth daily before breakfast.    Yes Historical Provider, MD  LORazepam (ATIVAN) 1 MG tablet Take 1 tablet (1 mg total) by mouth at bedtime as needed for anxiety or sleep. 04/23/16  Yes Gladstone Lighter, MD  midodrine (PROAMATINE) 2.5 MG tablet Take 1 tablet (2.5 mg total) by mouth 2 (two) times daily with a meal. 05/28/16  Yes Ryan M Dunn, PA-C  Multiple Vitamins-Minerals (MULTIVITAMIN GUMMIES ADULT PO) Take 1 each by mouth daily at 12 noon.    Yes Historical Provider, MD  potassium chloride (K-DUR) 10 MEQ tablet Take 1 tablet (10 mEq total) by mouth daily. 10/14/16 10/14/17 Yes Minna Merritts, MD  pravastatin (PRAVACHOL) 10 MG tablet Take 10 mg by mouth at bedtime.    Yes Historical Provider, MD  ranitidine (ZANTAC) 150 MG tablet Take 150 mg by mouth 2 (two) times daily.   Yes Historical Provider,  MD  acetaminophen (TYLENOL) 325 MG tablet Take 325 mg by mouth every 6 (six) hours as needed.    Historical Provider, MD      PHYSICAL EXAMINATION:   VITAL SIGNS: Blood pressure (!) 142/86, pulse (!) 102, temperature 98 F (36.7 C), temperature source Oral, resp. rate (!) 22, height 5' (1.524 m), weight 134 lb (60.8 kg), SpO2 93 %.  GENERAL:  82 y.o.-year-old patient lying in the bed with no acute distress.  EYES: Pupils equal, round, reactive to light and accommodation. No scleral icterus. Extraocular muscles intact.  HEENT: Head atraumatic, normocephalic. Oropharynx and nasopharynx clear.  NECK:  Supple, no jugular venous distention. No thyroid enlargement, no tenderness.  LUNGS: Bilateral crackles throughout both lungs as well as occasional wheezing. No sensory muscle usage  CARDIOVASCULAR: S1, S2 normal. No murmurs, rubs, or gallops.  ABDOMEN: Soft, nontender, nondistended. Bowel sounds present. No organomegaly or mass.  EXTREMITIES: No pedal edema, cyanosis, or clubbing.  NEUROLOGIC: Cranial nerves II through XII are intact. Muscle strength 5/5 in all extremities.  Sensation intact. Gait not checked.  PSYCHIATRIC: The patient is alert and oriented x 3.  SKIN: No obvious rash, lesion, or ulcer.   LABORATORY PANEL:   CBC  Recent Labs Lab 11/07/16 1449  WBC 4.7  HGB 12.4  HCT 35.8  PLT 216  MCV 88.8  MCH 30.7  MCHC 34.6  RDW 14.8*  LYMPHSABS 1.3  MONOABS 0.4  EOSABS 0.1  BASOSABS 0.0   ------------------------------------------------------------------------------------------------------------------  Chemistries   Recent Labs Lab 11/07/16 1449  NA 130*  K 4.3  CL 96*  CO2 27  GLUCOSE 96  BUN 18  CREATININE 1.18*  CALCIUM 8.6*   ------------------------------------------------------------------------------------------------------------------ estimated creatinine clearance is 29.4 mL/min (by C-G formula based on SCr of 1.18 mg/dL (H)). ------------------------------------------------------------------------------------------------------------------ No results for input(s): TSH, T4TOTAL, T3FREE, THYROIDAB in the last 72 hours.  Invalid input(s): FREET3   Coagulation profile No results for input(s): INR, PROTIME in the last 168 hours. ------------------------------------------------------------------------------------------------------------------- No results for input(s): DDIMER in the last 72 hours. -------------------------------------------------------------------------------------------------------------------  Cardiac Enzymes  Recent Labs Lab 11/07/16 1449  TROPONINI 0.03*   ------------------------------------------------------------------------------------------------------------------ Invalid input(s): POCBNP  ---------------------------------------------------------------------------------------------------------------  Urinalysis    Component Value Date/Time   COLORURINE YELLOW (A) 05/03/2016 0440   APPEARANCEUR HAZY (A) 05/03/2016 0440   LABSPEC 1.004 (L) 05/03/2016 0440   PHURINE 6.0 05/03/2016 0440    GLUCOSEU NEGATIVE 05/03/2016 0440   HGBUR NEGATIVE 05/03/2016 0440   BILIRUBINUR NEGATIVE 05/03/2016 0440   KETONESUR NEGATIVE 05/03/2016 0440   PROTEINUR NEGATIVE 05/03/2016 0440   NITRITE POSITIVE (A) 05/03/2016 0440   LEUKOCYTESUR 3+ (A) 05/03/2016 0440     RADIOLOGY: Dg Chest 2 View  Result Date: 11/07/2016 CLINICAL DATA:  Shortness of breath, difficulty breathing. History of ischemic cardiomyopathy and previous MI, COPD and interstitial lung disease, right mastectomy for malignancy. Former smoker. EXAM: CHEST  2 VIEW COMPARISON:  CT scan of the chest of October 16, 2016 and chest x-ray of October 04, 2016 FINDINGS: The left lung is well-expanded. There is chronic elevation of the right hemidiaphragm. The interstitial markings of both lungs are increased and more conspicuous than on the previous study. No alveolar infiltrate is observed. There is no significant pleural effusion. The heart is top-normal in size. The pulmonary vascularity is not engorged. The ICD is in stable position. There is calcification in the wall of the aortic arch. The bones are osteopenic. IMPRESSION: Chronic interstitial change within both lungs with increased conspicuity of the interstitial  markings since the previous study. This may reflect superimposed acute interstitial pneumonia or interstitial edema. No pleural effusion or alveolar pneumonia. Thoracic aortic atherosclerosis. Electronically Signed   By: David  Martinique M.D.   On: 11/07/2016 15:22    EKG: Orders placed or performed during the hospital encounter of 11/07/16  . EKG 12-Lead  . EKG 12-Lead  . ED EKG  . ED EKG    IMPRESSION AND PLAN: Patient is a 82 year old with history of new diagnosis of pulmonary fibrosis presenting with shortness of breath and cough  1. Acute hypoxic respiratory failure due to flare of interstitial lung disease and bronchitis I will treat with IV Solu-Medrol nebulizers and IV doxycycline. Add mucolytics to her  regimen Fluid has been ruled out  2. Coronary artery disease with borderline troponin due to demand ischemia we'll follow troponin levels Continue aspirin and Plavix 3. Hypothyrodism continue Synthroid 4. Hyperlipidemia unspecified continue present Pravachol 5. Miscellaneous Lovenox for DVT prophylaxis      All the records are reviewed and case discussed with ED provider. Management plans discussed with the patient, family and they are in agreement.  CODE STATUS: Code Status History    Date Active Date Inactive Code Status Order ID Comments User Context   04/20/2016  6:01 PM 04/26/2016  5:37 PM Full Code [id number]  Gladstone Lighter, MD Inpatient   04/10/2016 11:23 PM 04/15/2016  7:57 PM Full Code [id number]  Lance Coon, MD Inpatient   03/12/2016  4:31 PM 03/14/2016 10:20 PM Full Code [id number]  Loletha Grayer, MD ED       TOTAL TIME TAKING CARE OF THIS PATIENT: 55 minutes.    Dustin Flock M.D on 11/07/2016 at 6:17 PM  Between 7am to 6pm - Pager - [phone]  After 6pm go to [email or web address] - [facility]  Office  [phone]

## 2016-11-07 NOTE — ED Provider Notes (Signed)
[facility] Emergency Department Provider Note  ____________________________________________  Time seen: Approximately 4:37 PM  I have reviewed the triage vital signs and the nursing notes.   HISTORY  Chief Complaint Shortness of Breath   HPI Rune Tyson is a 82 y.o. female with a history of recently diagnosed pulmonary fibrosis, CHF, COPD, CAD, hypertension, hyperlipidemia who presents for evaluation of shortness of breath. Patient reports 3 days of cough productive of white sputum and progressively worsening shortness of breath. She endorses 8 out of 10 shortness of breath that is more severe with minimal exertion. No fever or chills, no chest pain, no nausea or vomiting, patient has had normal appetite. She is not on any inhalers at home. Her vaccines are up-to-date including flu shot. No known sick contact exposures. Patient denies orthopnea or weight gain.  Past Medical History:  Diagnosis Date  . Basal cell carcinoma 2011  . Blind left eye   . Breast cancer ([place]) 30+ yrs. ago   right breast, radiation  . CHF (congestive heart failure) ([place])   . COPD (chronic obstructive pulmonary disease) ([place])   . Coronary artery disease    a. s/p prior stent-RCA; b. cath 6/14: mild to mod prox to mid LAD dz, mod prox to dRCA at 60% unchanged from prior cath; c. nuc 3/16: no sig ischemia, nl WM, EF 19%; d. nuc 6/17: intermediate risk; e. cath 6/17 LM 20%, p-oLAD 20%, ostLCx 30%, ost/pRCA 90% s/p PCI/DES, dRCA 80% s/p PCI/DES  . Dyslipidemia   . Frequent falls   . History of MI (myocardial infarction)   . Hypertension   . Hypothyroidism   . Ischemic cardiomyopathy    a. s/p Medtronic CRT-D, NYHA II-III at baseline; b. echo 2011: EF 43%, mildly dilated LA, PASP 81mm Hg; c. echo 3/16: EF 35-40%, GR1DD, mild to mod AI, mild MR, PASP 65 mmHg  . LBBB (left bundle branch block)   . Orthostatic hypotension   . Respiratory infection   . TIA (transient ischemic attack)  02/2016   a. negative head CT x 2, unable to have MRI brain given ICD; b. carotid doppler with 0-49% stenosis bilaterally with antegrade flow bilaterally   . Vitamin D deficiency     Patient Active Problem List   Diagnosis Date Noted  . Interstitial lung disease ([place]) 10/28/2016  . Tachycardia 06/14/2016  . Chronic systolic congestive heart failure ([place])   . Dizziness 04/21/2016  . NSTEMI (non-ST elevated myocardial infarction) ([place]) 04/20/2016  . Hypothyroidism 04/10/2016  . COPD (chronic obstructive pulmonary disease) ([place]) 04/10/2016  . UTI (lower urinary tract infection) 04/10/2016  . Hyponatremia 04/10/2016  . Blind left eye   . Frequent falls   . LBBB (left bundle branch block)   . TIA (transient ischemic attack)   . Leg weakness, bilateral   . CVA (cerebral infarction) 03/12/2016  . Gait instability 01/08/2016  . Chronic systolic heart failure ([place]) 02/02/2015  . Weakness generalized 01/18/2014  . Acute bronchitis 12/28/2013  . Nonischemic cardiomyopathy ([place]) 12/28/2013  . Orthostatic hypotension 04/06/2012  . Biventricular implantable cardioverter-defibrillator in situ 04/04/2011  . HYPERLIPIDEMIA, MIXED 10/23/2009  . Essential hypertension 10/23/2009  . Coronary atherosclerosis 10/23/2009    Past Surgical History:  Procedure Laterality Date  . ABDOMINAL HYSTERECTOMY    . BASAL CELL CARCINOMA EXCISION     Monh's procedure  . BREAST EXCISIONAL BIOPSY Right 30+yrs ago   positive  . CARDIAC CATHETERIZATION  2014  . CARDIAC CATHETERIZATION N/A 04/24/2016  Procedure: Left Heart Cath and Coronary Angiography;  Surgeon: Wellington Hampshire, MD;  Location: [facility];  Service: Cardiovascular;  Laterality: N/A;  . CARDIAC CATHETERIZATION N/A 04/25/2016   Procedure: Coronary Stent Intervention;  Surgeon: Wellington Hampshire, MD;  Location: [place];  Service: Cardiovascular;  Laterality: N/A;  . CARDIAC DEFIBRILLATOR PLACEMENT    . EP IMPLANTABLE DEVICE N/A  04/05/2015   Procedure: BIV ICD Generator Changeout;  Surgeon: Deboraha Sprang, MD;  Location: [place] [facility];  Service: Cardiovascular;  Laterality: N/A;  . INSERT / REPLACE / REMOVE PACEMAKER    . MASTECTOMY      Prior to Admission medications   Medication Sig Start Date End Date Taking? Authorizing Provider  acetaminophen (TYLENOL) 325 MG tablet Take 325 mg by mouth every 6 (six) hours as needed.    Historical Provider, MD  alum & mag hydroxide-simeth (MAG-AL PLUS XS) [id number] MG/5ML suspension Take by mouth every 6 (six) hours as needed for indigestion.    Historical Provider, MD  aspirin 81 MG chewable tablet Chew 1 tablet (81 mg total) by mouth daily. 03/14/16   Gladstone Lighter, MD  Cholecalciferol 2000 units CAPS Take 1 capsule by mouth daily.    Historical Provider, MD  clopidogrel (PLAVIX) 75 MG tablet TAKE 1 TABLET (75 MG TOTAL) BY MOUTH DAILY. 09/15/16   Minna Merritts, MD  furosemide (LASIX) 20 MG tablet Take 1 tablet (20 mg total) by mouth daily. Patient taking differently: Take 20 mg by mouth as needed. Takes when weight goes over 3 pounds 04/23/16   Gladstone Lighter, MD  levothyroxine (SYNTHROID, LEVOTHROID) 75 MCG tablet Take 75 mcg by mouth daily before breakfast.     Historical Provider, MD  LORazepam (ATIVAN) 1 MG tablet Take 1 tablet (1 mg total) by mouth at bedtime as needed for anxiety or sleep. 04/23/16   Gladstone Lighter, MD  midodrine (PROAMATINE) 2.5 MG tablet Take 1 tablet (2.5 mg total) by mouth 2 (two) times daily with a meal. 05/28/16   Rise Mu, PA-C  Multiple Vitamins-Minerals (MULTIVITAMIN GUMMIES ADULT PO) Take 1 each by mouth daily at 12 noon.     Historical Provider, MD  potassium chloride (K-DUR) 10 MEQ tablet Take 1 tablet (10 mEq total) by mouth daily. 10/14/16 10/14/17  Minna Merritts, MD  pravastatin (PRAVACHOL) 10 MG tablet Take 10 mg by mouth at bedtime.     Historical Provider, MD    Allergies Ciprofloxacin; Ace inhibitors;  Amlodipine-atorvastatin; Atorvastatin; Amoxicillin; and Quinolones  Family History  Problem Relation Age of Onset  . Hypertension Mother   . Stroke Mother   . Hypertension Father   . Stroke Father   . Cancer Other   . Coronary artery disease Other   . Hypertension Other     Social History Social History  Substance Use Topics  . Smoking status: Former Research scientist (life sciences)  . Smokeless tobacco: Never Used  . Alcohol use No    Review of Systems  Constitutional: Negative for fever. Eyes: Negative for visual changes. ENT: Negative for sore throat. Neck: No neck pain  Cardiovascular: Negative for chest pain. Respiratory: + shortness of breath and cough Gastrointestinal: Negative for abdominal pain, vomiting or diarrhea. Genitourinary: Negative for dysuria. Musculoskeletal: Negative for back pain. Skin: Negative for rash. Neurological: Negative for headaches, weakness or numbness. Psych: No SI or HI  ____________________________________________   PHYSICAL EXAM:  VITAL SIGNS: ED Triage Vitals  Enc Vitals Group     BP  11/07/16 1442 (!) 142/86     Pulse Rate 11/07/16 1442 (!) 102     Resp 11/07/16 1442 (!) 22     Temp 11/07/16 1442 98 F (36.7 C)     Temp Source 11/07/16 1442 Oral     SpO2 11/07/16 1442 93 %     Weight 11/07/16 1443 134 lb (60.8 kg)     Height 11/07/16 1443 5' (1.524 m)     Head Circumference --      Peak Flow --      Pain Score --      Pain Loc --      Pain Edu? --      Excl. in [place]? --     Constitutional: Alert and oriented. Well appearing and in no apparent distress. HEENT:      Head: Normocephalic and atraumatic.         Eyes: Conjunctivae are normal. Sclera is non-icteric. EOMI. PERRL      Mouth/Throat: Mucous membranes are moist.       Neck: Supple with no signs of meningismus. Cardiovascular: Tachycardic with regular rhythm. No murmurs, gallops, or rubs. 2+ symmetrical distal pulses are present in all extremities. No JVD. Respiratory: Tachypneic,  sating low 90s on RA, diffuse expiratory wheezes and crackles throughout. Speaking in full sentences Gastrointestinal: Soft, non tender, and non distended with positive bowel sounds. No rebound or guarding. Musculoskeletal: Nontender with normal range of motion in all extremities. No edema, cyanosis, or erythema of extremities. Neurologic: Normal speech and language. Face is symmetric. Moving all extremities. No gross focal neurologic deficits are appreciated. Skin: Skin is warm, dry and intact. No rash noted. Psychiatric: Mood and affect are normal. Speech and behavior are normal.  ____________________________________________   LABS (all labs ordered are listed, but only abnormal results are displayed)  Labs Reviewed  CBC WITH DIFFERENTIAL/PLATELET - Abnormal; Notable for the following:       Result Value   RDW 14.8 (*)    All other components within normal limits  BASIC METABOLIC PANEL - Abnormal; Notable for the following:    Sodium 130 (*)    Chloride 96 (*)    Creatinine, Ser 1.18 (*)    Calcium 8.6 (*)    GFR calc non Af Amer 41 (*)    GFR calc Af Amer 48 (*)    All other components within normal limits  TROPONIN I - Abnormal; Notable for the following:    Troponin I 0.03 (*)    All other components within normal limits  BRAIN NATRIURETIC PEPTIDE - Abnormal; Notable for the following:    B Natriuretic Peptide 506.0 (*)    All other components within normal limits  TROPONIN I  INFLUENZA PANEL BY PCR (TYPE A & B, H1N1)   ____________________________________________  EKG  ED ECG REPORT I, Rudene Re, the attending physician, personally viewed and interpreted this ECG.  Atrial sensed ventricular paced rhythm, rate of 101, prolonged QTC, right axis deviation, no ST elevation. Unchanged from prior ____________________________________________  RADIOLOGY  CXR: Chronic interstitial change within both lungs with increased conspicuity of the interstitial markings  since the previous study. This may reflect superimposed acute interstitial pneumonia or interstitial edema. No pleural effusion or alveolar pneumonia. ____________________________________________   PROCEDURES  Procedure(s) performed: None Procedures Critical Care performed: yes  CRITICAL CARE Performed by: Rudene Re  ?  Total critical care time: 35 min  Critical care time was exclusive of separately billable procedures and treating other patients.  Critical  care was necessary to treat or prevent imminent or life-threatening deterioration.  Critical care was time spent personally by me on the following activities: development of treatment plan with patient and/or surrogate as well as nursing, discussions with consultants, evaluation of patient's response to treatment, examination of patient, obtaining history from patient or surrogate, ordering and performing treatments and interventions, ordering and review of laboratory studies, ordering and review of radiographic studies, pulse oximetry and re-evaluation of patient's condition.  ____________________________________________   INITIAL IMPRESSION / ASSESSMENT AND PLAN / ED COURSE  82 y.o. female with a history of recently diagnosed pulmonary fibrosis, CHF, COPD, CAD, hypertension, hyperlipidemia who presents for evaluation of shortness of breath x 3 days in the setting of productive cough. Patient has no orthopnea, no weight gain, no pitting edema making CHF exacerbation less likely. She does have chest x-ray concerning for possible pneumonia. We'll give DuoNeb treatments and prednisone as patient is wheezing, will give ceftriaxone and doxycycline for CA-PNA. Will give gentle hydration as patient is tachycardic but has CHF with EF 30-35%. BNP, Flu pending.  Clinical Course as of Nov 08 1751  Fri Nov 07, 2016  1750 Patient no longer wheezing and moving good air. Sats 95% at rest. Patient's sats dropped to 85% with ambulation  to and from the bathroom. Will admit to Hospitalist  [CV]    Clinical Course User Index [CV] Rudene Re, MD    Pertinent labs & imaging results that were available during my care of the patient were reviewed by me and considered in my medical decision making (see chart for details).    ____________________________________________   FINAL CLINICAL IMPRESSION(S) / ED DIAGNOSES  Final diagnoses:  Acute respiratory failure with hypoxia ([place])      NEW MEDICATIONS STARTED DURING THIS VISIT:  New Prescriptions   No medications on file     Note:  This document was prepared using Dragon voice recognition software and may include unintentional dictation errors.    Rudene Re, MD 11/07/16 [phone]

## 2016-11-08 ENCOUNTER — Inpatient Hospital Stay: Payer: PPO

## 2016-11-08 DIAGNOSIS — J84112 Idiopathic pulmonary fibrosis: Secondary | ICD-10-CM

## 2016-11-08 DIAGNOSIS — J471 Bronchiectasis with (acute) exacerbation: Secondary | ICD-10-CM

## 2016-11-08 DIAGNOSIS — R042 Hemoptysis: Secondary | ICD-10-CM

## 2016-11-08 DIAGNOSIS — J9621 Acute and chronic respiratory failure with hypoxia: Secondary | ICD-10-CM

## 2016-11-08 LAB — BASIC METABOLIC PANEL
ANION GAP: 8 (ref 5–15)
BUN: 18 mg/dL (ref 6–20)
CO2: 23 mmol/L (ref 22–32)
Calcium: 8.5 mg/dL — ABNORMAL LOW (ref 8.9–10.3)
Chloride: 101 mmol/L (ref 101–111)
Creatinine, Ser: 1.08 mg/dL — ABNORMAL HIGH (ref 0.44–1.00)
GFR calc non Af Amer: 46 mL/min — ABNORMAL LOW (ref >60)
GFR, EST AFRICAN AMERICAN: 53 mL/min — AB (ref >60)
GLUCOSE: 208 mg/dL — AB (ref 65–99)
POTASSIUM: 4.4 mmol/L (ref 3.5–5.1)
Sodium: 132 mmol/L — ABNORMAL LOW (ref 135–145)

## 2016-11-08 LAB — TROPONIN I
TROPONIN I: 0.08 ng/mL — AB (ref <0.03)
Troponin I: 0.07 ng/mL (ref <0.03)

## 2016-11-08 LAB — EXPECTORATED SPUTUM ASSESSMENT W GRAM STAIN, RFLX TO RESP C

## 2016-11-08 LAB — CBC
HCT: 32.7 % — ABNORMAL LOW (ref 35.0–47.0)
HEMOGLOBIN: 11.4 g/dL — AB (ref 12.0–16.0)
MCH: 30.9 pg (ref 26.0–34.0)
MCHC: 35 g/dL (ref 32.0–36.0)
MCV: 88.5 fL (ref 80.0–100.0)
Platelets: 181 10*3/uL (ref 150–440)
RBC: 3.7 MIL/uL — AB (ref 3.80–5.20)
RDW: 14.9 % — ABNORMAL HIGH (ref 11.5–14.5)
WBC: 3.2 10*3/uL — ABNORMAL LOW (ref 3.6–11.0)

## 2016-11-08 LAB — EXPECTORATED SPUTUM ASSESSMENT W REFEX TO RESP CULTURE

## 2016-11-08 MED ORDER — IOPAMIDOL (ISOVUE-300) INJECTION 61%
75.0000 mL | Freq: Once | INTRAVENOUS | Status: AC | PRN
  Administered 2016-11-08: 60 mL via INTRAVENOUS

## 2016-11-08 MED ORDER — OXYMETAZOLINE HCL 0.05 % NA SOLN
1.0000 | Freq: Once | NASAL | Status: AC
  Administered 2016-11-08: 1 via NASAL
  Filled 2016-11-08: qty 15

## 2016-11-08 MED ORDER — IOPAMIDOL (ISOVUE-370) INJECTION 76%: 75.0000 mL | Freq: Once | INTRAVENOUS | Status: DC | PRN

## 2016-11-08 MED ORDER — LIDOCAINE HCL 4 % EX SOLN
Freq: Once | CUTANEOUS | Status: DC
  Filled 2016-11-08: qty 50

## 2016-11-08 MED ORDER — ENOXAPARIN SODIUM 30 MG/0.3ML ~~LOC~~ SOLN: 30.0000 mg | SUBCUTANEOUS | Status: DC

## 2016-11-08 MED ORDER — SALINE SPRAY 0.65 % NA SOLN
1.0000 | Freq: Four times a day (QID) | NASAL | Status: DC
  Administered 2016-11-08 – 2016-11-13 (×17): 1 via NASAL
  Filled 2016-11-08 (×2): qty 44

## 2016-11-08 MED ORDER — OXYMETAZOLINE HCL 0.05 % NA SOLN
1.0000 | Freq: Two times a day (BID) | NASAL | Status: AC
  Administered 2016-11-09 – 2016-11-11 (×5): 1 via NASAL
  Filled 2016-11-08: qty 15

## 2016-11-08 MED ORDER — PREDNISONE 20 MG PO TABS: 20.0000 mg | ORAL_TABLET | Freq: Every day | ORAL | Status: DC

## 2016-11-08 MED ORDER — ENOXAPARIN SODIUM 40 MG/0.4ML ~~LOC~~ SOLN
40.0000 mg | SUBCUTANEOUS | Status: DC
  Administered 2016-11-08 – 2016-11-12 (×5): 40 mg via SUBCUTANEOUS
  Filled 2016-11-08 (×5): qty 0.4

## 2016-11-08 MED ORDER — FAMOTIDINE 20 MG PO TABS
10.0000 mg | ORAL_TABLET | Freq: Two times a day (BID) | ORAL | Status: DC
  Administered 2016-11-08 – 2016-11-12 (×10): 10 mg via ORAL
  Filled 2016-11-08 (×2): qty 1
  Filled 2016-11-08: qty 2
  Filled 2016-11-08 (×7): qty 1

## 2016-11-08 MED ORDER — BUDESONIDE 0.25 MG/2ML IN SUSP
0.2500 mg | Freq: Two times a day (BID) | RESPIRATORY_TRACT | Status: DC
  Administered 2016-11-08 – 2016-11-13 (×10): 0.25 mg via RESPIRATORY_TRACT
  Filled 2016-11-08 (×10): qty 2

## 2016-11-08 MED ORDER — ARFORMOTEROL TARTRATE 15 MCG/2ML IN NEBU
15.0000 ug | INHALATION_SOLUTION | Freq: Two times a day (BID) | RESPIRATORY_TRACT | Status: DC
  Administered 2016-11-08 – 2016-11-12 (×9): 15 ug via RESPIRATORY_TRACT
  Filled 2016-11-08 (×14): qty 2

## 2016-11-08 NOTE — Consult Note (Signed)
PULMONARY CONSULT NOTE  Requesting MD/Service: Manuella Ghazi Date of initial consultation: 11/08/16 Reason for consultation: hemoptysis   PT PROFILE: 82 y.o. F with history of pulmonary fibrosis (thought to be IPF) recently diagnosed @ [facility]. She is now followed by Dr Raul Del. Admitted 12/22 with several days of increasing dyspnea, rattling cough and scant bloody sputum. PMH also includes cardiomyopathy with LVEF 30-35%  DATA: CT chest 04/20/16: Airspace consolidation in the posterior segments of both upper lobes, more severe on the left than on the right. These areas rib consistent with pneumonia. Suspect mild interstitial edema. There is a small left effusion. There is a degree of underlying parenchymal fibrosis in both upper and lower lobes with relative lower lobe predominance PFTs 10/07/16: c/w mod-severe restriction  HRCT chest 10/16/16: The appearance of the lungs is compatible with interstitial lung disease, and the spectrum of findings is considered diagnostic from an imaging standpoint of usual interstitial pneumonia (UIP)     HPI:  As above. She denies CP, fever, LE edema and calf tenderness, PND and nocturia. She has chronic 2 pillow orthopnea which has not changed recently. She has had scant hemoptysis on several occasions in the past with prior episodes of respiratory tract infections. She received nebulized BDs in the ED and felt that it helped her mobilize lung secretions. She has been on systemic steroids which she has been very sensitive to in the past.   At her baseline, on a good day, she is markedly limited by dyspnea and struggles with ADs including dressing herself. She has recently spent time in a SNF after a previous hospitalization.   Past Medical History:  Diagnosis Date  . Basal cell carcinoma 2011  . Blind left eye   . Breast cancer ([place]) 30+ yrs. ago   right breast, radiation  . CHF (congestive heart failure) ([place])   . COPD (chronic obstructive pulmonary disease)  ([place])   . Coronary artery disease    a. s/p prior stent-RCA; b. cath 6/14: mild to mod prox to mid LAD dz, mod prox to dRCA at 60% unchanged from prior cath; c. nuc 3/16: no sig ischemia, nl WM, EF 19%; d. nuc 6/17: intermediate risk; e. cath 6/17 LM 20%, p-oLAD 20%, ostLCx 30%, ost/pRCA 90% s/p PCI/DES, dRCA 80% s/p PCI/DES  . Dyslipidemia   . Frequent falls   . History of MI (myocardial infarction)   . Hypertension   . Hypothyroidism   . Ischemic cardiomyopathy    a. s/p Medtronic CRT-D, NYHA II-III at baseline; b. echo 2011: EF 43%, mildly dilated LA, PASP 26mm Hg; c. echo 3/16: EF 35-40%, GR1DD, mild to mod AI, mild MR, PASP 65 mmHg  . LBBB (left bundle branch block)   . Orthostatic hypotension   . Respiratory infection   . TIA (transient ischemic attack) 02/2016   a. negative head CT x 2, unable to have MRI brain given ICD; b. carotid doppler with 0-49% stenosis bilaterally with antegrade flow bilaterally   . Vitamin D deficiency     Past Surgical History:  Procedure Laterality Date  . ABDOMINAL HYSTERECTOMY    . BASAL CELL CARCINOMA EXCISION     Monh's procedure  . BREAST EXCISIONAL BIOPSY Right 30+yrs ago   positive  . CARDIAC CATHETERIZATION  2014  . CARDIAC CATHETERIZATION N/A 04/24/2016   Procedure: Left Heart Cath and Coronary Angiography;  Surgeon: Wellington Hampshire, MD;  Location: [facility];  Service: Cardiovascular;  Laterality: N/A;  . CARDIAC CATHETERIZATION N/A 04/25/2016  Procedure: Coronary Stent Intervention;  Surgeon: Wellington Hampshire, MD;  Location: [facility];  Service: Cardiovascular;  Laterality: N/A;  . CARDIAC DEFIBRILLATOR PLACEMENT    . EP IMPLANTABLE DEVICE N/A 04/05/2015   Procedure: BIV ICD Generator Changeout;  Surgeon: Deboraha Sprang, MD;  Location: [facility];  Service: Cardiovascular;  Laterality: N/A;  . INSERT / REPLACE / REMOVE PACEMAKER    . MASTECTOMY      MEDICATIONS: I have reviewed all medications and confirmed  regimen as documented  Social History   Social History  . Marital status: Widowed    Spouse name: N/A  . Number of children: N/A  . Years of education: N/A   Occupational History  . Not on file.   Social History Main Topics  . Smoking status: Former Research scientist (life sciences)  . Smokeless tobacco: Never Used  . Alcohol use No  . Drug use: No  . Sexual activity: Not on file   Other Topics Concern  . Not on file   Social History Narrative  . No narrative on file    Family History  Problem Relation Age of Onset  . Hypertension Mother   . Stroke Mother   . Hypertension Father   . Stroke Father   . Cancer Other   . Coronary artery disease Other   . Hypertension Other     ROS: No fever, myalgias/arthralgias, unexplained weight loss or weight gain No new focal weakness or sensory deficits No otalgia, hearing loss, visual changes, nasal and sinus symptoms, mouth and throat problems No neck pain or adenopathy No abdominal pain, N/V/D, diarrhea, change in bowel pattern No dysuria, change in urinary pattern   Vitals:   11/07/16 1901 11/07/16 2030 11/08/16 0432 11/08/16 1220  BP: 138/68 (!) 120/93 (!) 149/70 (!) 159/78  Pulse: (!) 108 (!) 105 94 99  Resp:  16 19   Temp:  98.1 F (36.7 C) 97.7 F (36.5 C)   TempSrc:  Oral Oral   SpO2: 92% 99% 97% 93%  Weight:      Height:         EXAM:  Gen: Extremely animated, no overt respiratory distress HEENT: NCAT, sclera white, oropharynx normal Neck: Supple without LAN, thyromegaly, JVD Lungs: breath sounds diminished with bibasilar crackles and a few wheezes on the L percussion note is normal throughout Cardiovascular: Reg, no murmurs noted Abdomen: Soft, nontender, normal BS Ext: without clubbing, cyanosis, edema Neuro: CNs grossly intact, motor and sensory intact Skin: Limited exam, no lesions noted  DATA:   BMP Latest Ref Rng & Units 11/08/2016 11/07/2016 10/04/2016  Glucose 65 - 99 mg/dL 208(H) 96 98  BUN 6 - 20 mg/dL 18 18 13    Creatinine 0.44 - 1.00 mg/dL 1.08(H) 1.18(H) 1.14(H)  BUN/Creat Ratio 11 - 26 - - -  Sodium 135 - 145 mmol/L 132(L) 130(L) 133(L)  Potassium 3.5 - 5.1 mmol/L 4.4 4.3 4.0  Chloride 101 - 111 mmol/L 101 96(L) 102  CO2 22 - 32 mmol/L 23 27 25   Calcium 8.9 - 10.3 mg/dL 8.5(L) 8.6(L) 8.8(L)    CBC Latest Ref Rng & Units 11/08/2016 11/07/2016 10/04/2016  WBC 3.6 - 11.0 K/uL 3.2(L) 4.7 6.0  Hemoglobin 12.0 - 16.0 g/dL 11.4(L) 12.4 12.0  Hematocrit 35.0 - 47.0 % 32.7(L) 35.8 35.4  Platelets 150 - 440 K/uL 181 216 201    CXR:  Chronic interstitial change within both lungs with increased conspicuity of the interstitial markings since the previous study CT chest  11/08/16: Persistent changes of IPF with increased R>L ground glass opacities and small R effusion  IMPRESSION:   1) Acute on chronic hypoxic respiratory failure 2) Pulmonary fibrosis - UIP pattern on CT chest. Likely IPF 3) Probably mild BLL "stretch bronchiectasis 4) Acute ground glass opacities that could represent superimposed edema, infection or inflammation (such as acute exacerbation of IPF) 5) Scant hemoptysis - this is recurrent and likely related to processes listed above (bronchiectasis and/or PNA) 6) Relative intolerance to systemic steroids - she is hypomanic presently after methylprednisolone   PLAN:  1) Agree with doxycycline - would complete 7-10 days 2) Agree with systemic steroids but will decrease dose due to her extreme sensitivity to these meds 3) Add nebulized steroids and bronchodilators 4) Agree with diuresis - keep her as "dry" as her BP and renal function will permit 5) I think bronchoscopy is low yield right now and therefore not indicated 6) I will check on her again 12/25.  7) She will need assessment of her O2 needs prior to discharge 8) Would discharge on an ICS/LABA combination 9) After discharge, she should follow up with Dr Raul Del 10) I will check in on her 12/25    Merton Border, MD PCCM  service Mobile [phone] Pager [phone] 11/08/2016

## 2016-11-08 NOTE — Consult Note (Signed)
Otolaryngology Emergency Department/Inpatient Consult  Erica, [name] [id number] [birth year] Erica Sane, MD  Reason for Consult: Hemoptysis  HPI: 82 y.o. F with history of pulmonary fibrosis admitted 12/22 with several days of increasing dyspnea, rattling cough and scant bloody sputum.   The patient and her daughter tell me she has coughed up bloody sputum on 3-4 occasions today. This is not normal for her, though it happened several weeks ago. She has also experienced a small amount of epistaxis from the left side. She has dabbed this with a tissue and it resolved spontaneously. Her daughter thinks this may be related to steroids because she was on prednisone both times she had bleeding.   No new dysphonia or dysphagia. Remote smoking history as a teenager. It has been suggested that bleeding could be due to her underlying bronchiectasis and/or pneumonia.   She normally takes a baby ASA and plavix (h/o stents), but these were discontinued today. She is on prophylactic lovenox currently. She does not use oxygen at home, but is currently on 3L O2 via humidified [place].   Of note, she had a panic attack shortly before my arrival.   Allergies:  Allergies  Allergen Reactions  . Ciprofloxacin Anaphylaxis, Hives and Other (See Comments)    pruritis  . Ace Inhibitors Other (See Comments)    Leg cramps  . Advair Diskus [Fluticasone-Salmeterol] Other (See Comments)    Tongue broke out with bumps  . Amlodipine-Atorvastatin Other (See Comments)    Muscle pain  . Atorvastatin Other (See Comments)    Muscle cramps  . Cefdinir Other (See Comments)  . Amoxicillin Itching, Rash and Other (See Comments)    Has patient had a PCN reaction causing immediate rash, facial/tongue/throat swelling, SOB or lightheadedness with hypotension: No Has patient had a PCN reaction causing severe rash involving mucus membranes or skin necrosis: No Has patient had a PCN reaction that required hospitalization No Has  patient had a PCN reaction occurring within the last 10 years: Yes If all of the above answers are "NO", then may proceed with Cephalosporin use.  . Quinolones Itching and Rash    ROS: 12 point review of systems normal other than right ear pain per HPI.   PMH:  Past Medical History:  Diagnosis Date  . Basal cell carcinoma 2011  . Blind left eye   . Breast cancer ([place]) 30+ yrs. ago   right breast, radiation  . CHF (congestive heart failure) ([place])   . COPD (chronic obstructive pulmonary disease) ([place])   . Coronary artery disease    a. s/p prior stent-RCA; b. cath 6/14: mild to mod prox to mid LAD dz, mod prox to dRCA at 60% unchanged from prior cath; c. nuc 3/16: no sig ischemia, nl WM, EF 19%; d. nuc 6/17: intermediate risk; e. cath 6/17 LM 20%, p-oLAD 20%, ostLCx 30%, ost/pRCA 90% s/p PCI/DES, dRCA 80% s/p PCI/DES  . Dyslipidemia   . Frequent falls   . History of MI (myocardial infarction)   . Hypertension   . Hypothyroidism   . Ischemic cardiomyopathy    a. s/p Medtronic CRT-D, NYHA II-III at baseline; b. echo 2011: EF 43%, mildly dilated LA, PASP 55mm Hg; c. echo 3/16: EF 35-40%, GR1DD, mild to mod AI, mild MR, PASP 65 mmHg  . LBBB (left bundle branch block)   . Orthostatic hypotension   . Respiratory infection   . TIA (transient ischemic attack) 02/2016   a. negative head CT x 2, unable to have MRI brain  given ICD; b. carotid doppler with 0-49% stenosis bilaterally with antegrade flow bilaterally   . Vitamin D deficiency     FH:  Family History  Problem Relation Age of Onset  . Hypertension Mother   . Stroke Mother   . Hypertension Father   . Stroke Father   . Cancer Other   . Coronary artery disease Other   . Hypertension Other     SH:  Social History   Social History  . Marital status: Widowed    Spouse name: N/A  . Number of children: N/A  . Years of education: N/A   Occupational History  . Not on file.   Social History Main Topics  . Smoking status:  Former Research scientist (life sciences)  . Smokeless tobacco: Never Used  . Alcohol use No  . Drug use: No  . Sexual activity: Not on file   Other Topics Concern  . Not on file   Social History Narrative  . No narrative on file    PSH:  Past Surgical History:  Procedure Laterality Date  . ABDOMINAL HYSTERECTOMY    . BASAL CELL CARCINOMA EXCISION     Monh's procedure  . BREAST EXCISIONAL BIOPSY Right 30+yrs ago   positive  . CARDIAC CATHETERIZATION  2014  . CARDIAC CATHETERIZATION N/A 04/24/2016   Procedure: Left Heart Cath and Coronary Angiography;  Surgeon: Wellington Hampshire, MD;  Location: [place] [facility];  Service: Cardiovascular;  Laterality: N/A;  . CARDIAC CATHETERIZATION N/A 04/25/2016   Procedure: Coronary Stent Intervention;  Surgeon: Wellington Hampshire, MD;  Location: [place] [facility];  Service: Cardiovascular;  Laterality: N/A;  . CARDIAC DEFIBRILLATOR PLACEMENT    . EP IMPLANTABLE DEVICE N/A 04/05/2015   Procedure: BIV ICD Generator Changeout;  Surgeon: Deboraha Sprang, MD;  Location: [place] [facility];  Service: Cardiovascular;  Laterality: N/A;  . INSERT / REPLACE / REMOVE PACEMAKER    . MASTECTOMY      Physical  Exam:  Gen: Awake and alert, comfortable appearing.  Eyes: Grossly normal position and movement Ears: EACs patent and TMs intact without apparent middle ear pathology Nose: SMALL, PUNCTATE AREAS OF BLEEDING ON EACH SIDE OF THE NASAL SEPTUM. THESE ARE LOCATED AT THE TIPS OF THE NASAL CANNULA.  Oral cavity: Tongue midline, no lesions, normal dentition Oropharynx: NO ACTIVE BLEEDING OR CLOTS.  Neck: Soft and flat without masses, no thyroid asymmetry or masses, no lymphadenopathy Cardio: Regular rate, no cyanosis Resp: Breathing easily without labor, no stridor or stertor Psych: Appropriate mood and affect. ANXIOUS.    A/P: 17F admitted with progressive dyspnea chronically anticoagulated with plavix and aspirin who has had several episodes of small volume left sided epistaxis  and hemoptysis. Exam shows two small areas of bleeding on the nasal septum located near the tips of the nasal cannula.   - Start Afrin BID x 3 days then stop (ordered). - Start nasal saline spray QID (ordered). - Nasal cannula is worsening this problem and if possible, please adjust delivery (face tent, mask, etc.) - Recommended nasal endoscopy and flexible laryngoscopy to ensure no additional source of bleeding, but patient deferred.  - Offered cautery of lesions with silver nitrate but patient prefers to hold off, but proceed if bleeding continues.  - Please call if bleeding worsens or does not resolve - [phone]. Overall, this seems to be minor.     Jamond Neels C 11/08/2016 7:11 PM

## 2016-11-08 NOTE — Progress Notes (Signed)
A&O. Up with assist. No complaints. Slept well through the night. On O2 at 2L. On IV antibiotics and Solu-medrol.

## 2016-11-08 NOTE — Progress Notes (Addendum)
[facility] [place] NAME: Erica [name]    MR#:  [id number]  DATE OF BIRTH:  [birth year]  SUBJECTIVE:  CHIEF COMPLAINT:   Chief Complaint  Patient presents with  . Shortness of Breath  Difficulty hearing, still feeling short of breath REVIEW OF SYSTEMS:  Review of Systems  Constitutional: Negative for chills, fever and weight loss.  HENT: Positive for hearing loss. Negative for nosebleeds and sore throat.   Eyes: Negative for blurred vision.  Respiratory: Positive for shortness of breath. Negative for cough and wheezing.   Cardiovascular: Negative for chest pain, orthopnea, leg swelling and PND.  Gastrointestinal: Negative for abdominal pain, constipation, diarrhea, heartburn, nausea and vomiting.  Genitourinary: Negative for dysuria and urgency.  Musculoskeletal: Negative for back pain.  Skin: Negative for rash.  Neurological: Negative for dizziness, speech change, focal weakness and headaches.  Endo/Heme/Allergies: Does not bruise/bleed easily.  Psychiatric/Behavioral: Negative for depression.   DRUG ALLERGIES:   Allergies  Allergen Reactions  . Ciprofloxacin Anaphylaxis, Hives and Other (See Comments)    pruritis  . Ace Inhibitors Other (See Comments)    Leg cramps  . Advair Diskus [Fluticasone-Salmeterol] Other (See Comments)    Tongue broke out with bumps  . Amlodipine-Atorvastatin Other (See Comments)    Muscle pain  . Atorvastatin Other (See Comments)    Muscle cramps  . Cefdinir Other (See Comments)  . Amoxicillin Itching, Rash and Other (See Comments)    Has patient had a PCN reaction causing immediate rash, facial/tongue/throat swelling, SOB or lightheadedness with hypotension: No Has patient had a PCN reaction causing severe rash involving mucus membranes or skin necrosis: No Has patient had a PCN reaction that required hospitalization No Has patient had a PCN reaction occurring within the last 10 years: Yes If  all of the above answers are "NO", then may proceed with Cephalosporin use.  . Quinolones Itching and Rash   VITALS:  Blood pressure (!) 159/78, pulse 99, temperature 97.7 F (36.5 C), temperature source Oral, resp. rate 19, height 5' (1.524 m), weight 60.8 kg (134 lb), SpO2 93 %. PHYSICAL EXAMINATION:  Physical Exam  Constitutional: She is oriented to person, place, and time and well-developed, well-nourished, and in no distress.  HENT:  Head: Normocephalic and atraumatic.  Eyes: Conjunctivae and EOM are normal. Pupils are equal, round, and reactive to light.  Neck: Normal range of motion. Neck supple. No tracheal deviation present. No thyromegaly present.  Cardiovascular: Normal rate, regular rhythm and normal heart sounds.   Pulmonary/Chest: She is in respiratory distress. She has wheezes. She exhibits no tenderness.  Abdominal: Soft. Bowel sounds are normal. She exhibits no distension. There is no tenderness.  Musculoskeletal: Normal range of motion.  Neurological: She is alert and oriented to person, place, and time. No cranial nerve deficit.  Skin: Skin is warm and dry. No rash noted.  Psychiatric: Mood and affect normal.   LABORATORY PANEL:   CBC  Recent Labs Lab 11/08/16 0023  WBC 3.2*  HGB 11.4*  HCT 32.7*  PLT 181   ------------------------------------------------------------------------------------------------------------------ Chemistries   Recent Labs Lab 11/08/16 0023  NA 132*  K 4.4  CL 101  CO2 23  GLUCOSE 208*  BUN 18  CREATININE 1.08*  CALCIUM 8.5*   RADIOLOGY:  Dg Chest 2 View  Result Date: 11/07/2016 CLINICAL DATA:  Shortness of breath, difficulty breathing. History of ischemic cardiomyopathy and previous MI, COPD and interstitial lung disease, right mastectomy for malignancy. Former  smoker. EXAM: CHEST  2 VIEW COMPARISON:  CT scan of the chest of October 16, 2016 and chest x-ray of October 04, 2016 FINDINGS: The left lung is well-expanded.  There is chronic elevation of the right hemidiaphragm. The interstitial markings of both lungs are increased and more conspicuous than on the previous study. No alveolar infiltrate is observed. There is no significant pleural effusion. The heart is top-normal in size. The pulmonary vascularity is not engorged. The ICD is in stable position. There is calcification in the wall of the aortic arch. The bones are osteopenic. IMPRESSION: Chronic interstitial change within both lungs with increased conspicuity of the interstitial markings since the previous study. This may reflect superimposed acute interstitial pneumonia or interstitial edema. No pleural effusion or alveolar pneumonia. Thoracic aortic atherosclerosis. Electronically Signed   By: David  Martinique M.D.   On: 11/07/2016 15:22   ASSESSMENT AND PLAN:  Patient is a 82 year old with history of new diagnosis of pulmonary fibrosis presenting with shortness of breath and cough  1. Acute hypoxic respiratory failure due to flare of interstitial lung disease and bronchitis - continue IV Solu-Medrol nebulizers and IV doxycycline. - continue mucolytics  2. Coronary artery disease - due to demand ischemia  Continue aspirin and Plavix  3. Hypothyrodism continue Synthroid, has had thyroid nodules on 2 previous CT chest, get Thyroid US for further evaluation  4. Hyperlipidemia unspecified continue present Pravachol  5. Miscellaneous Lovenox for DVT prophylaxis     All the records are reviewed and case discussed with Care Management/Social Worker. Management plans discussed with the patient, family (Discussed with her daughter on phone) and they are in agreement.  CODE STATUS: Full code  TOTAL TIME TAKING CARE OF THIS PATIENT: 35 minutes.   More than 50% of the time was spent in counseling/coordination of care: YES  POSSIBLE D/C IN 1-2 DAYS, DEPENDING ON CLINICAL CONDITION.   [name] M.D on 11/08/2016 at 12:36 PM  Between 7am to 6pm -  Pager - [phone]  After 6pm go to [email or web address] - Proofreader  [facility]  Office  [phone]  CC: Primary care physician; Kirk Ruths., MD  Note: This dictation was prepared with Dragon dictation along with smaller phrase technology. Any transcriptional errors that result from this process are unintentional.

## 2016-11-08 NOTE — Consult Note (Signed)
Pulmonary Critical Care  Initial Consult Note   Erica [name] [id number] DOB: 06-22-1931 DOA: 11/07/2016  Referring physician: [name], MD PCP: Kirk Ruths., MD   Chief Complaint: Hemoptysis  HPI: Erica [name] is a 82 y.o. female followed by Dr Raul Del known history of CHF CAD HTN ILD with Fibrosis. Presented to the hospital with increased shortness of breath. Patient had been having a cough also and congestion. No fever noted. Patient has not had any chest pain noted. Patient in the ED was significantly hypoxic. On evaluation patient was started on IV steroids and abx with a diagnosis of ILD flare. Patient today called her nurse and was noted to have hemoptysis. On further questioning it appears to be more from the nose and back of her throat. Patient has been on oxygen which has dried her nose out. She has now been placed on humidity. Dr Manuella Ghazi asked for the ASA and Plavix to be stopped.    Review of Systems:  Constitutional:  No weight loss, night sweats, Fevers.  HEENT:  No headaches, nasal congestion, post nasal drip, +blood from nose Cardio-vascular:  No chest pain, anasarca, dizziness, palpitations  GI:  No heartburn, indigestion, abdominal pain, nausea, vomiting, diarrhea  Resp:  +shortness of breath +??coughing up of blood Skin:  no rash or lesions.  Musculoskeletal:  No joint pain or swelling.   Remainder ROS performed and is unremarkable other than noted in HPI  Past Medical History:  Diagnosis Date  . Basal cell carcinoma 2011  . Blind left eye   . Breast cancer ([place]) 30+ yrs. ago   right breast, radiation  . CHF (congestive heart failure) ([place])   . COPD (chronic obstructive pulmonary disease) ([place])   . Coronary artery disease    a. s/p prior stent-RCA; b. cath 6/14: mild to mod prox to mid LAD dz, mod prox to dRCA at 60% unchanged from prior cath; c. nuc 3/16: no sig ischemia, nl WM, EF 19%; d. nuc 6/17: intermediate risk; e. cath 6/17 LM 20%,  p-oLAD 20%, ostLCx 30%, ost/pRCA 90% s/p PCI/DES, dRCA 80% s/p PCI/DES  . Dyslipidemia   . Frequent falls   . History of MI (myocardial infarction)   . Hypertension   . Hypothyroidism   . Ischemic cardiomyopathy    a. s/p Medtronic CRT-D, NYHA II-III at baseline; b. echo 2011: EF 43%, mildly dilated LA, PASP 91mm Hg; c. echo 3/16: EF 35-40%, GR1DD, mild to mod AI, mild MR, PASP 65 mmHg  . LBBB (left bundle branch block)   . Orthostatic hypotension   . Respiratory infection   . TIA (transient ischemic attack) 02/2016   a. negative head CT x 2, unable to have MRI brain given ICD; b. carotid doppler with 0-49% stenosis bilaterally with antegrade flow bilaterally   . Vitamin D deficiency    Past Surgical History:  Procedure Laterality Date  . ABDOMINAL HYSTERECTOMY    . BASAL CELL CARCINOMA EXCISION     Monh's procedure  . BREAST EXCISIONAL BIOPSY Right 30+yrs ago   positive  . CARDIAC CATHETERIZATION  2014  . CARDIAC CATHETERIZATION N/A 04/24/2016   Procedure: Left Heart Cath and Coronary Angiography;  Surgeon: Wellington Hampshire, MD;  Location: [place];  Service: Cardiovascular;  Laterality: N/A;  . CARDIAC CATHETERIZATION N/A 04/25/2016   Procedure: Coronary Stent Intervention;  Surgeon: Wellington Hampshire, MD;  Location: [place] [facility];  Service: Cardiovascular;  Laterality: N/A;  . CARDIAC DEFIBRILLATOR PLACEMENT    .  EP IMPLANTABLE DEVICE N/A 04/05/2015   Procedure: BIV ICD Generator Changeout;  Surgeon: Deboraha Sprang, MD;  Location: [facility];  Service: Cardiovascular;  Laterality: N/A;  . INSERT / REPLACE / REMOVE PACEMAKER    . MASTECTOMY     Social History:  reports that she has quit smoking. She has never used smokeless tobacco. She reports that she does not drink alcohol or use drugs.  Allergies  Allergen Reactions  . Ciprofloxacin Anaphylaxis, Hives and Other (See Comments)    pruritis  . Ace Inhibitors Other (See Comments)    Leg cramps  . Advair  Diskus [Fluticasone-Salmeterol] Other (See Comments)    Tongue broke out with bumps  . Amlodipine-Atorvastatin Other (See Comments)    Muscle pain  . Atorvastatin Other (See Comments)    Muscle cramps  . Cefdinir Other (See Comments)  . Amoxicillin Itching, Rash and Other (See Comments)    Has patient had a PCN reaction causing immediate rash, facial/tongue/throat swelling, SOB or lightheadedness with hypotension: No Has patient had a PCN reaction causing severe rash involving mucus membranes or skin necrosis: No Has patient had a PCN reaction that required hospitalization No Has patient had a PCN reaction occurring within the last 10 years: Yes If all of the above answers are "NO", then may proceed with Cephalosporin use.  . Quinolones Itching and Rash    Family History  Problem Relation Age of Onset  . Hypertension Mother   . Stroke Mother   . Hypertension Father   . Stroke Father   . Cancer Other   . Coronary artery disease Other   . Hypertension Other     Prior to Admission medications   Medication Sig Start Date End Date Taking? Authorizing Provider  aspirin 81 MG chewable tablet Chew 1 tablet (81 mg total) by mouth daily. 03/14/16  Yes Gladstone Lighter, MD  Cholecalciferol 2000 units CAPS Take 1 capsule by mouth daily after lunch.    Yes Historical Provider, MD  clopidogrel (PLAVIX) 75 MG tablet TAKE 1 TABLET (75 MG TOTAL) BY MOUTH DAILY. 09/15/16  Yes Minna Merritts, MD  furosemide (LASIX) 20 MG tablet Take 1 tablet (20 mg total) by mouth daily. Patient taking differently: Take 20 mg by mouth every other day.  04/23/16  Yes Gladstone Lighter, MD  levothyroxine (SYNTHROID, LEVOTHROID) 75 MCG tablet Take 75 mcg by mouth daily before breakfast.    Yes Historical Provider, MD  LORazepam (ATIVAN) 1 MG tablet Take 1 tablet (1 mg total) by mouth at bedtime as needed for anxiety or sleep. 04/23/16  Yes Gladstone Lighter, MD  midodrine (PROAMATINE) 2.5 MG tablet Take 1 tablet (2.5 mg  total) by mouth 2 (two) times daily with a meal. 05/28/16  Yes Ryan M Dunn, PA-C  Multiple Vitamins-Minerals (MULTIVITAMIN GUMMIES ADULT PO) Take 1 each by mouth daily after lunch.    Yes Historical Provider, MD  Potassium Gluconate 550 MG TABS Take 550 mg by mouth daily.   Yes Historical Provider, MD  pravastatin (PRAVACHOL) 10 MG tablet Take 10 mg by mouth at bedtime.    Yes Historical Provider, MD  ranitidine (ZANTAC) 150 MG tablet Take 150 mg by mouth 2 (two) times daily.   Yes Historical Provider, MD  acetaminophen (TYLENOL) 325 MG tablet Take 325 mg by mouth every 6 (six) hours as needed.    Historical Provider, MD  potassium chloride (K-DUR) 10 MEQ tablet Take 1 tablet (10 mEq total) by mouth daily. Patient not taking:  Reported on 11/07/2016 10/14/16 10/14/17  Minna Merritts, MD   Physical Exam: Vitals:   11/07/16 1901 11/07/16 2030 11/08/16 0432 11/08/16 1220  BP: 138/68 (!) 120/93 (!) 149/70 (!) 159/78  Pulse: (!) 108 (!) 105 94 99  Resp:  16 19   Temp:  98.1 F (36.7 C) 97.7 F (36.5 C)   TempSrc:  Oral Oral   SpO2: 92% 99% 97% 93%  Weight:      Height:        Wt Readings from Last 3 Encounters:  11/07/16 60.8 kg (134 lb)  10/28/16 62.4 kg (137 lb 8 oz)  10/08/16 61.8 kg (136 lb 4 oz)    General:  Appears calm and comfortable Eyes: PERRL, normal lids, irises & conjunctiva ENT: grossly normal hearing, lips & tongue Neck: no LAD, masses or thyromegaly Cardiovascular: RRR, no m/r/g. No LE edema. Respiratory: CTA bilaterally, no w/r/r. Normal respiratory effort. Abdomen: soft, nontender Skin: no rash or induration seen on limited exam Musculoskeletal: grossly normal tone BUE/BLE Psychiatric: grossly normal mood and affect Neurologic: grossly non-focal.          Labs on Admission:  Basic Metabolic Panel:  Recent Labs Lab 11/07/16 1449 11/08/16 0023  NA 130* 132*  K 4.3 4.4  CL 96* 101  CO2 27 23  GLUCOSE 96 208*  BUN 18 18  CREATININE 1.18* 1.08*   CALCIUM 8.6* 8.5*   Liver Function Tests: No results for input(s): AST, ALT, ALKPHOS, BILITOT, PROT, ALBUMIN in the last 168 hours. No results for input(s): LIPASE, AMYLASE in the last 168 hours. No results for input(s): AMMONIA in the last 168 hours. CBC:  Recent Labs Lab 11/07/16 1449 11/08/16 0023  WBC 4.7 3.2*  NEUTROABS 2.9  --   HGB 12.4 11.4*  HCT 35.8 32.7*  MCV 88.8 88.5  PLT 216 181   Cardiac Enzymes:  Recent Labs Lab 11/07/16 1449 11/07/16 1717 11/08/16 0023 11/08/16 0826  TROPONINI 0.03* 0.03* 0.07* 0.08*    BNP (last 3 results)  Recent Labs  03/13/16 1056 10/04/16 0857 11/07/16 1449  BNP 557.0* 403.0* 506.0*    ProBNP (last 3 results) No results for input(s): PROBNP in the last 8760 hours.  CBG: No results for input(s): GLUCAP in the last 168 hours.  Radiological Exams on Admission: Dg Chest 2 View  Result Date: 11/07/2016 CLINICAL DATA:  Shortness of breath, difficulty breathing. History of ischemic cardiomyopathy and previous MI, COPD and interstitial lung disease, right mastectomy for malignancy. Former smoker. EXAM: CHEST  2 VIEW COMPARISON:  CT scan of the chest of October 16, 2016 and chest x-ray of October 04, 2016 FINDINGS: The left lung is well-expanded. There is chronic elevation of the right hemidiaphragm. The interstitial markings of both lungs are increased and more conspicuous than on the previous study. No alveolar infiltrate is observed. There is no significant pleural effusion. The heart is top-normal in size. The pulmonary vascularity is not engorged. The ICD is in stable position. There is calcification in the wall of the aortic arch. The bones are osteopenic. IMPRESSION: Chronic interstitial change within both lungs with increased conspicuity of the interstitial markings since the previous study. This may reflect superimposed acute interstitial pneumonia or interstitial edema. No pleural effusion or alveolar pneumonia. Thoracic  aortic atherosclerosis. Electronically Signed   By: David  Martinique M.D.   On: 11/07/2016 15:22    EKG: Independently reviewed.  Assessment/Plan Active Problems:   SOB (shortness of breath)   1. Hemoptysis/Nose bleed -agree  with stopping the plavix and ASA at this time -would get a CT scan of the chest with contrast now more for the ILD and also to evaluate the possible hemoptysis -if hemoptysis persists may need to have a bronch to evaluate the airways -would also get ENT to evaluate the patient  2. Pulmonary Fibrosis -followed by Dr Raul Del -cotinue with steroids as you are -also on abx due to suspected bronchitis   Code Status: full code  Family Communication: none Disposition Plan: home  Time spent: 36min    I have personally obtained a history, examined the patient, evaluated laboratory and imaging results, formulated the assessment and plan and placed orders.  The Patient requires high complexity decision making for assessment and support.    Allyne Gee, MD [facility] Pulmonary Critical Care Medicine Sleep Medicine

## 2016-11-08 NOTE — Progress Notes (Signed)
Patient coughing up frank red blood clots- 2 clots noted in bedside container. Dr Manuella Ghazi paged and made aware. ASA and Plavix to be held at this time. Pulmonology consult entered. Patient very dyspneic with minimal movement. Per MD, Ok to hold on attempt to wean and ambulating for desats. Container given to collect sputum collection.  Will continue to monitor

## 2016-11-08 NOTE — Progress Notes (Addendum)
Pharmacist - Prescriber Communication  1. Enoxaparin modified to 30 mg subcutaneously daily for CrCl > 30 mL/min. 2. Famotidine modified to 10 mg po BID for CrCl > 50 mL/min.  Jaylianna Tatlock A. [place], [place].D., BCPS Clinical Pharmacist 11/08/2016 [phone]

## 2016-11-08 NOTE — Progress Notes (Signed)
Anticoagulation monitoring(Lovenox):  82yo  F ordered Lovenox 30 mg Q24h  Filed Weights   11/07/16 1443  Weight: 134 lb (60.8 kg)   BMI 26.2   Lab Results  Component Value Date   CREATININE 1.08 (H) 11/08/2016   CREATININE 1.18 (H) 11/07/2016   CREATININE 1.14 (H) 10/04/2016   Estimated Creatinine Clearance: 32.2 mL/min (by C-G formula based on SCr of 1.08 mg/dL (H)). Hemoglobin & Hematocrit     Component Value Date/Time   HGB 11.4 (L) 11/08/2016 0023   HGB 10.8 (L) 12/28/2013 1547   HCT 32.7 (L) 11/08/2016 0023   HCT 33.9 (L) 03/23/2015 0840     Per Protocol for Patient with estCrcl > 30 ml/min and BMI < 40, will transition to Lovenox 40 mg Q24h.     Chinita Greenland PharmD Clinical Pharmacist 11/08/2016

## 2016-11-09 MED ORDER — LORAZEPAM 1 MG PO TABS
1.0000 mg | ORAL_TABLET | Freq: Four times a day (QID) | ORAL | Status: DC | PRN
  Administered 2016-11-09 – 2016-11-11 (×5): 1 mg via ORAL
  Filled 2016-11-09 (×6): qty 1

## 2016-11-09 NOTE — Progress Notes (Signed)
Family Meeting Note  Advance Directive:yes  Today a meeting took place with the Patient.  The following clinical team members were present during this meeting:MD  The following were discussed:Patient's diagnosis: , Patient's progosis: < 12 months and Goals for treatment: DNR   I also had a discussion with patient's daughter via phone who is in agreement.  Additional follow-up to be provided: Palliative care eval  Time spent during discussion:20 minutes  [name], MD

## 2016-11-09 NOTE — Progress Notes (Signed)
PT Cancellation Note  Patient Details Name: Erica [name] MRN: [id number] DOB: Apr 02, 1931   Cancelled Treatment:    Reason Eval/Treat Not Completed: Medical issues which prohibited therapy.  Elevating troponins and will check later to see how pt is doing.   Ramond Dial 11/09/2016, 11:36 AM   Mee Hives, PT MS Acute Rehab Dept. Number: [facility]

## 2016-11-09 NOTE — Progress Notes (Addendum)
[facility] [place] NAME: Erica [name]    MR#:  [id number]  DATE OF BIRTH:  [birth year]  SUBJECTIVE:  CHIEF COMPLAINT:   Chief Complaint  Patient presents with  . Shortness of Breath  Seemed to be having panic attack, unable to urinate requiring Foley, no more having hemoptysis which she had last evening REVIEW OF SYSTEMS:  Review of Systems  Constitutional: Positive for malaise/fatigue. Negative for chills, fever and weight loss.  HENT: Positive for hearing loss. Negative for nosebleeds and sore throat.   Eyes: Negative for blurred vision.  Respiratory: Positive for cough, hemoptysis and shortness of breath. Negative for wheezing.   Cardiovascular: Negative for chest pain, orthopnea, leg swelling and PND.  Gastrointestinal: Negative for abdominal pain, constipation, diarrhea, heartburn, nausea and vomiting.  Genitourinary: Negative for dysuria and urgency.  Musculoskeletal: Negative for back pain.  Skin: Negative for rash.  Neurological: Positive for weakness. Negative for dizziness, speech change, focal weakness and headaches.  Endo/Heme/Allergies: Does not bruise/bleed easily.  Psychiatric/Behavioral: Negative for depression. The patient is nervous/anxious.    DRUG ALLERGIES:   Allergies  Allergen Reactions  . Ciprofloxacin Anaphylaxis, Hives and Other (See Comments)    pruritis  . Ace Inhibitors Other (See Comments)    Leg cramps  . Advair Diskus [Fluticasone-Salmeterol] Other (See Comments)    Tongue broke out with bumps  . Amlodipine-Atorvastatin Other (See Comments)    Muscle pain  . Atorvastatin Other (See Comments)    Muscle cramps  . Cefdinir Other (See Comments)  . Amoxicillin Itching, Rash and Other (See Comments)    Has patient had a PCN reaction causing immediate rash, facial/tongue/throat swelling, SOB or lightheadedness with hypotension: No Has patient had a PCN reaction causing severe rash involving mucus  membranes or skin necrosis: No Has patient had a PCN reaction that required hospitalization No Has patient had a PCN reaction occurring within the last 10 years: Yes If all of the above answers are "NO", then may proceed with Cephalosporin use.  . Quinolones Itching and Rash   VITALS:  Blood pressure (!) 155/78, pulse 96, temperature 98.2 F (36.8 C), temperature source Oral, resp. rate (!) 24, height 5' (1.524 m), weight 62.4 kg (137 lb 8 oz), SpO2 90 %. PHYSICAL EXAMINATION:  Physical Exam  Constitutional: She is oriented to person, place, and time and well-developed, well-nourished, and in no distress.  HENT:  Head: Normocephalic and atraumatic.  Eyes: Conjunctivae and EOM are normal. Pupils are equal, round, and reactive to light.  Neck: Normal range of motion. Neck supple. No tracheal deviation present. No thyromegaly present.  Cardiovascular: Normal rate, regular rhythm and normal heart sounds.   Pulmonary/Chest: She is in respiratory distress. She has wheezes. She exhibits no tenderness.  Abdominal: Soft. Bowel sounds are normal. She exhibits no distension. There is no tenderness.  Musculoskeletal: Normal range of motion.  Neurological: She is alert and oriented to person, place, and time. No cranial nerve deficit.  Skin: Skin is warm and dry. No rash noted.  Psychiatric: Her mood appears anxious. She is agitated.   LABORATORY PANEL:   CBC  Recent Labs Lab 11/08/16 0023  WBC 3.2*  HGB 11.4*  HCT 32.7*  PLT 181   ------------------------------------------------------------------------------------------------------------------ Chemistries   Recent Labs Lab 11/08/16 0023  NA 132*  K 4.4  CL 101  CO2 23  GLUCOSE 208*  BUN 18  CREATININE 1.08*  CALCIUM 8.5*   RADIOLOGY:  Ct Chest  W Contrast  Result Date: 11/08/2016 CLINICAL DATA:  Increasing shortness of Breath and hemoptysis EXAM: CT CHEST WITH CONTRAST TECHNIQUE: Multidetector CT imaging of the chest was  performed during intravenous contrast administration. CONTRAST:  31mL ISOVUE-300 IOPAMIDOL (ISOVUE-300) INJECTION 61% COMPARISON:  11/07/2016, 10/16/2016 FINDINGS: Cardiovascular: Diffuse aortic calcifications are noted. No aneurysmal dilatation or dissection is identified. Heavy coronary calcifications are seen. Pacing device is again noted. The pulmonary artery shows a normal branching pattern. No central pulmonary emboli are seen. Mediastinum/Nodes: Thoracic inlet is within normal limits. The previously seen thyroid nodule is less well appreciated on contrast enhanced images. The hilar and mediastinal structures show no significant lymphadenopathy. Scattered small hilar lymph nodes are seen. Lungs/Pleura: The lungs are well aerated bilaterally. Significant increase in acute infiltrate is noted throughout the right lung. Small right pleural effusion is noted. Small left effusion is noted as well. Upper Abdomen: No acute abnormality noted. Musculoskeletal: Degenerative change of thoracic spine is noted. Stable T3 compression deformity is noted superiorly. Healing fracture of the right sixth rib is noted. No other acute bony abnormality is seen. IMPRESSION: Acute on chronic infiltrate throughout the right lung the more prominent in the upper lobes. Some reactive lymphadenopathy is seen. The remainder of the exam is stable from the prior study. Electronically Signed   By: Inez Catalina M.D.   On: 11/08/2016 18:09   US Thyroid  Result Date: 11/09/2016 CLINICAL DATA:  Left thyroid nodule on chest CT 10/16/2016 EXAM: THYROID ULTRASOUND TECHNIQUE: Ultrasound examination of the thyroid gland and adjacent soft tissues was performed. COMPARISON:  10/16/2016 FINDINGS: Parenchymal Echotexture: Moderately heterogenous Estimated total number of nodules >/= 1 cm: 1 Number of spongiform nodules >/=  2 cm not described below (TR1): 0 Number of mixed cystic and solid nodules >/= 1.5 cm not described below (TR2): 0  _________________________________________________________ Isthmus: 3 mm No discrete nodules are identified within the thyroid isthmus. _________________________________________________________ Right lobe: 2.7 x 1.3 x 1.1 cm Moderate heterogeneous echotexture. Punctate tiny scattered hypoechoic nodules in the right lobe, majority measure 3 mm or less in size. Nodule # 1: Location: Right; Mid Size: 6 x 5 x 6 mm Composition: solid/almost completely solid (2) Echogenicity: hypoechoic (2) Shape: not taller-than-wide (0) Margins: ill-defined (0) Echogenic foci: none (0) ACR TI-RADS total points: 4. ACR TI-RADS risk category: TR4 (4-6 points). ACR TI-RADS recommendations: Given size (<0.9 cm) and appearance, this nodule does NOT meet TI-RADS criteria for biopsy or dedicated follow-up. _________________________________________________________ Left lobe: 3.1 x 2.2 x 1.9 cm Nodule # 2: Location: Left; Inferior Size: 1.9 x 1.8 x 2.0 cm Composition: solid/almost completely solid (2) Echogenicity: hypoechoic (2) Shape: not taller-than-wide (0) Margins: ill-defined (0) Echogenic foci: none (0) ACR TI-RADS total points: 4. ACR TI-RADS risk category: TR4 (4-6 points). ACR TI-RADS recommendations: **Given size (>/= 1.5 cm) and appearance, fine needle aspiration of this moderately suspicious nodule should be considered based on TI-RADS criteria. IMPRESSION: 1.9 cm solid hypoechoic left inferior thyroid nodule (TR 4 nodule). This nodule meets criteria for biopsy as above. The above is in keeping with the ACR TI-RADS recommendations - J Am Coll Radiol 2017;14:587-595. Electronically Signed   By: Jerilynn Mages.  Shick M.D.   On: 11/09/2016 08:46   ASSESSMENT AND PLAN:  Patient is a 82 year old with history of new diagnosis of pulmonary fibrosis presenting with shortness of breath and cough  * Acute hypoxic respiratory failure due to flare of interstitial lung disease and bronchitis - continue nebulizers and IV doxycycline. - Stop steroids  as  she seemed to be having panic attacks from same - continue mucolytics  * Panic attacks - Use Ativan as needed and stop steroids  * Hemoptysis/epistaxis - Appreciate pulmonary/ENT consultation - Can be from constant coughing and oxygenation tube leading to mouth being dry, she was also on aspirin, Plavix and Lovenox, which has been stopped now  - Not having hemoptysis today - She had two small areas of bleeding on the nasal septum located near the tips of the nasal cannula Per ENT - Patient refused nasal endoscopy and/or cauterization of those lesions  * Urinary retention - place Foley catheter  * Thyroid nodules - Confirmed by thyroid ultrasound, will need to have FNAC as an outpatient for further evaluation and rule out malignancy  * Coronary artery disease - stable, hold aspirin and Plavix due to hemoptysis  * Hypothyrodism continue Synthroid, has had thyroid nodules on 2 previous CT chest, get Thyroid US for further evaluation  * Hyperlipidemia unspecified continue present Pravachol  * Miscellaneous- Hold Lovenox due to hemoptysis   Physical therapy consultation, daughter feels she may need replacement  All the records are reviewed and case discussed with Care Management/Social Worker. Management plans discussed with the patient, family (Discussed with her daughter on phone) and they are in agreement.  CODE STATUS: DO NOT RESUSCITATE  TOTAL TIME TAKING CARE OF THIS PATIENT: 35 minutes.   More than 50% of the time was spent in counseling/coordination of care: YES  POSSIBLE D/C IN 1-2 DAYS, DEPENDING ON CLINICAL CONDITION.   [name] M.D on 11/09/2016 at 9:16 AM  Between 7am to 6pm - Pager - [phone]  After 6pm go to [email or web address] - Proofreader  [facility]  Office  [phone]  CC: Primary care physician; Kirk Ruths., MD  Note: This dictation was prepared with Dragon dictation along with smaller phrase  technology. Any transcriptional errors that result from this process are unintentional.

## 2016-11-09 NOTE — Progress Notes (Signed)
Ativan 1mg  po given for complaints of "feel anxious". Will monitor.

## 2016-11-09 NOTE — Progress Notes (Signed)
OT Cancellation Note  Patient Details Name: Erica [name] MRN: [id number] DOB: [birth year]   Cancelled Treatment:     OT order received, chart reviewed, patient with medical issues with trend of rising troponin levels, will hold evaluation until stabilized. Will continue attempt for OT eval on Tuesday am.  Amy T Lovett, OTR/L, CLT   Lovett,Amy 11/09/2016, 12:34 PM

## 2016-11-10 DIAGNOSIS — J9611 Chronic respiratory failure with hypoxia: Secondary | ICD-10-CM

## 2016-11-10 LAB — CBC
HEMATOCRIT: 35.9 % (ref 35.0–47.0)
Hemoglobin: 12.4 g/dL (ref 12.0–16.0)
MCH: 30.1 pg (ref 26.0–34.0)
MCHC: 34.6 g/dL (ref 32.0–36.0)
MCV: 87.1 fL (ref 80.0–100.0)
PLATELETS: 208 10*3/uL (ref 150–440)
RBC: 4.12 MIL/uL (ref 3.80–5.20)
RDW: 14.5 % (ref 11.5–14.5)
WBC: 6.7 10*3/uL (ref 3.6–11.0)

## 2016-11-10 LAB — BASIC METABOLIC PANEL
ANION GAP: 8 (ref 5–15)
BUN: 27 mg/dL — ABNORMAL HIGH (ref 6–20)
CO2: 27 mmol/L (ref 22–32)
Calcium: 8.4 mg/dL — ABNORMAL LOW (ref 8.9–10.3)
Chloride: 94 mmol/L — ABNORMAL LOW (ref 101–111)
Creatinine, Ser: 1.14 mg/dL — ABNORMAL HIGH (ref 0.44–1.00)
GFR calc Af Amer: 50 mL/min — ABNORMAL LOW (ref >60)
GFR, EST NON AFRICAN AMERICAN: 43 mL/min — AB (ref >60)
GLUCOSE: 103 mg/dL — AB (ref 65–99)
POTASSIUM: 4.2 mmol/L (ref 3.5–5.1)
Sodium: 129 mmol/L — ABNORMAL LOW (ref 135–145)

## 2016-11-10 MED ORDER — SODIUM CHLORIDE 0.9 % IV SOLN
INTRAVENOUS | Status: AC
  Administered 2016-11-10 – 2016-11-11 (×2): via INTRAVENOUS

## 2016-11-10 NOTE — Progress Notes (Addendum)
[facility] at [place] NAME: Erica [name]    MR#:  [id number]  DATE OF BIRTH:  06-Jan-1931  SUBJECTIVE:  CHIEF COMPLAINT:   Chief Complaint  Patient presents with  . Shortness of Breath  Much better, slept well.  Daughter at bedside REVIEW OF SYSTEMS:  Review of Systems  Constitutional: Positive for malaise/fatigue. Negative for chills, fever and weight loss.  HENT: Positive for hearing loss. Negative for nosebleeds and sore throat.   Eyes: Negative for blurred vision.  Respiratory: Positive for cough and shortness of breath. Negative for wheezing.   Cardiovascular: Negative for chest pain, orthopnea, leg swelling and PND.  Gastrointestinal: Negative for abdominal pain, constipation, diarrhea, heartburn, nausea and vomiting.  Genitourinary: Negative for dysuria and urgency.  Musculoskeletal: Negative for back pain.  Skin: Negative for rash.  Neurological: Positive for weakness. Negative for dizziness, speech change, focal weakness and headaches.  Endo/Heme/Allergies: Does not bruise/bleed easily.  Psychiatric/Behavioral: Negative for depression. The patient is nervous/anxious.    DRUG ALLERGIES:   Allergies  Allergen Reactions  . Ciprofloxacin Anaphylaxis, Hives and Other (See Comments)    pruritis  . Ace Inhibitors Other (See Comments)    Leg cramps  . Advair Diskus [Fluticasone-Salmeterol] Other (See Comments)    Tongue broke out with bumps  . Amlodipine-Atorvastatin Other (See Comments)    Muscle pain  . Atorvastatin Other (See Comments)    Muscle cramps  . Cefdinir Other (See Comments)  . Amoxicillin Itching, Rash and Other (See Comments)    Has patient had a PCN reaction causing immediate rash, facial/tongue/throat swelling, SOB or lightheadedness with hypotension: No Has patient had a PCN reaction causing severe rash involving mucus membranes or skin necrosis: No Has patient had a PCN reaction that required  hospitalization No Has patient had a PCN reaction occurring within the last 10 years: Yes If all of the above answers are "NO", then may proceed with Cephalosporin use.  . Quinolones Itching and Rash   VITALS:  Blood pressure 95/76, pulse 93, temperature 98.2 F (36.8 C), temperature source Oral, resp. rate 19, height 5' (1.524 m), weight 62.4 kg (137 lb 8 oz), SpO2 91 %. PHYSICAL EXAMINATION:  Physical Exam  Constitutional: She is oriented to person, place, and time and well-developed, well-nourished, and in no distress.  HENT:  Head: Normocephalic and atraumatic.  Eyes: Conjunctivae and EOM are normal. Pupils are equal, round, and reactive to light.  Neck: Normal range of motion. Neck supple. No tracheal deviation present. No thyromegaly present.  Cardiovascular: Normal rate, regular rhythm and normal heart sounds.   Pulmonary/Chest: She has wheezes. She exhibits no tenderness.  Abdominal: Soft. Bowel sounds are normal. She exhibits no distension. There is no tenderness.  Musculoskeletal: Normal range of motion.  Neurological: She is alert and oriented to person, place, and time. No cranial nerve deficit.  Skin: Skin is warm and dry. No rash noted.  Psychiatric: Her mood appears anxious. She is agitated.   LABORATORY PANEL:   CBC  Recent Labs Lab 11/10/16 0515  WBC 6.7  HGB 12.4  HCT 35.9  PLT 208   ------------------------------------------------------------------------------------------------------------------ Chemistries   Recent Labs Lab 11/10/16 0515  NA 129*  K 4.2  CL 94*  CO2 27  GLUCOSE 103*  BUN 27*  CREATININE 1.14*  CALCIUM 8.4*   RADIOLOGY:  No results found. ASSESSMENT AND PLAN:  Patient is a 82 year old with history of new diagnosis of pulmonary fibrosis presenting with shortness  of breath and cough  * Acute hypoxic respiratory failure due to flare of interstitial lung disease and bronchitis - continue nebulizers and IV doxycycline. -  Stopping steroids has helped. - continue mucolytics  * Hyponatremia - Hold Lasix and give IV hydration - Monitor sodium  * Panic attacks - Use Ativan as needed and stop steroids  * Hemoptysis/epistaxis - Can be from constant coughing and oxygenation tube leading to mouth being dry, she was also on aspirin, Plavix and Lovenox, which has been stopped now  - Not having hemoptysis anymore  * Urinary retention: resolved - remove Foley catheter  * Thyroid nodules - Confirmed by thyroid ultrasound, will need to have FNAC as an outpatient for further evaluation and rule out malignancy  * Coronary artery disease - stable, hold aspirin and Plavix due to hemoptysis  * Hypothyrodism continue Synthroid, has had thyroid nodules on 2 previous CT chest, get Thyroid US for further evaluation  * Hyperlipidemia unspecified continue Pravachol  * Miscellaneous- Hold Lovenox due to hemoptysis    Physical therapy consultation, Likely needs rehab placement  All the records are reviewed and case discussed with Care Management/Social Worker. Management plans discussed with the patient, family (Discussed with her daughter at bedside) and they are in agreement.   CODE STATUS: DO NOT RESUSCITATE  TOTAL TIME TAKING CARE OF THIS PATIENT: 35 minutes.   More than 50% of the time was spent in counseling/coordination of care: YES  POSSIBLE D/C IN 1-2 DAYS, DEPENDING ON CLINICAL CONDITION. Likely Needs rehab   [name] M.D on 11/10/2016 at 8:43 AM  Between 7am to 6pm - Pager - [phone]  After 6pm go to [email or web address] - Proofreader  [facility]  Office  [phone]  CC: Primary care physician; Kirk Ruths., MD  Note: This dictation was prepared with Dragon dictation along with smaller phrase technology. Any transcriptional errors that result from this process are unintentional.

## 2016-11-10 NOTE — Progress Notes (Signed)
Family Meeting Note  Advance Directive:yes  Today a meeting took place with the Patient and Daughter.  The following clinical team members were present during this meeting:MD  The following were discussed:Patient's diagnosis: , Patient's progosis: < 12 months and Goals for treatment: DNR   I also had a discussion with patient's daughter at bedside who is in agreement.  Additional follow-up to be provided: Palliative care eval as an outpt with possible hospice involvement  Time spent during discussion:20 minutes  [name], MD

## 2016-11-10 NOTE — Progress Notes (Signed)
No further hemoptysis. She did not tolerate even low dose systemic steroids. Denies dyspnea  Vitals:   11/09/16 2028 11/10/16 0541 11/10/16 0750 11/10/16 0819  BP:  130/89  95/76  Pulse:  88  93  Resp:  19  20  Temp:  98.2 F (36.8 C)  98.4 F (36.9 C)  TempSrc:  Oral  Oral  SpO2: 93% 95% 95% 91%  Weight:      Height:       NAD HEENT WNL No JVD Bilateral basilar crackles Reg, no M NABS No C/C/E  BMP Latest Ref Rng & Units 11/10/2016 11/08/2016 11/07/2016  Glucose 65 - 99 mg/dL 103(H) 208(H) 96  BUN 6 - 20 mg/dL 27(H) 18 18  Creatinine 0.44 - 1.00 mg/dL 1.14(H) 1.08(H) 1.18(H)  BUN/Creat Ratio 11 - 26 - - -  Sodium 135 - 145 mmol/L 129(L) 132(L) 130(L)  Potassium 3.5 - 5.1 mmol/L 4.2 4.4 4.3  Chloride 101 - 111 mmol/L 94(L) 101 96(L)  CO2 22 - 32 mmol/L 27 23 27   Calcium 8.9 - 10.3 mg/dL 8.4(L) 8.5(L) 8.6(L)   CBC Latest Ref Rng & Units 11/10/2016 11/08/2016 11/07/2016  WBC 3.6 - 11.0 K/uL 6.7 3.2(L) 4.7  Hemoglobin 12.0 - 16.0 g/dL 12.4 11.4(L) 12.4  Hematocrit 35.0 - 47.0 % 35.9 32.7(L) 35.8  Platelets 150 - 440 K/uL 208 181 216   No new CXR  IMPRESSION: 1) Acute on chronic hypoxic respiratory failure 2) Pulmonary fibrosis - UIP pattern on CT chest. Likely IPF 3) Probably mild bronchiectasis 4) Scant hemoptysis - resolved 6) Intolerance to systemic steroids due to hypomania  PLAN/REC: - Complete 7-10 days doxycycline - Continue inhaled or nebulized ICS/LABA as maintenance therapy - Continue PRN albuterol - Assess O2 needs prior to discharge - She should follow up with Dr Raul Del after discharge - I had a detailed discussion with pt's family re: prognosis and goals of care. They recognize that her lung disease is progressive and have noted significant decline in pt's functional status over past 1-2 yrs. They express a desire that her life not be prolonged artificially with heroic measures. This conversation occurred in the hallway after I had left the pt's room  and did not include the pt. It would be reasonable to start addressing goals of care with the pt. I recommend that she not be intubated or that, if she desires intubation in the event of respiratory arrest, that it be short term only. Recommend further discussion (?Palliative Care consultation) in the near future. She is a candidate for Hospice in my opinion  PCCM will sign off. Please call if we can be of further assistance  Merton Border, MD PCCM service Mobile [phone] Pager [phone] 11/10/2016

## 2016-11-11 LAB — BASIC METABOLIC PANEL
Anion gap: 5 (ref 5–15)
BUN: 25 mg/dL — AB (ref 6–20)
CO2: 25 mmol/L (ref 22–32)
Calcium: 8.2 mg/dL — ABNORMAL LOW (ref 8.9–10.3)
Chloride: 97 mmol/L — ABNORMAL LOW (ref 101–111)
Creatinine, Ser: 0.98 mg/dL (ref 0.44–1.00)
GFR calc Af Amer: >60 mL/min (ref >60)
GFR, EST NON AFRICAN AMERICAN: 52 mL/min — AB (ref >60)
GLUCOSE: 141 mg/dL — AB (ref 65–99)
POTASSIUM: 4.7 mmol/L (ref 3.5–5.1)
Sodium: 127 mmol/L — ABNORMAL LOW (ref 135–145)

## 2016-11-11 LAB — CBC
HEMATOCRIT: 33.4 % — AB (ref 35.0–47.0)
Hemoglobin: 11.8 g/dL — ABNORMAL LOW (ref 12.0–16.0)
MCH: 30.8 pg (ref 26.0–34.0)
MCHC: 35.5 g/dL (ref 32.0–36.0)
MCV: 86.9 fL (ref 80.0–100.0)
PLATELETS: 210 10*3/uL (ref 150–440)
RBC: 3.84 MIL/uL (ref 3.80–5.20)
RDW: 14.8 % — AB (ref 11.5–14.5)
WBC: 6.8 10*3/uL (ref 3.6–11.0)

## 2016-11-11 LAB — CULTURE, RESPIRATORY

## 2016-11-11 LAB — CULTURE, RESPIRATORY W GRAM STAIN

## 2016-11-11 MED ORDER — MORPHINE SULFATE (PF) 4 MG/ML IV SOLN
1.0000 mg | INTRAVENOUS | Status: DC | PRN
  Administered 2016-11-11 – 2016-11-12 (×2): 1 mg via INTRAVENOUS
  Filled 2016-11-11 (×2): qty 1

## 2016-11-11 MED ORDER — LORAZEPAM 1 MG PO TABS
1.0000 mg | ORAL_TABLET | ORAL | Status: DC | PRN
  Administered 2016-11-12 (×2): 1 mg via ORAL
  Filled 2016-11-11 (×2): qty 1

## 2016-11-11 MED ORDER — SODIUM CHLORIDE 0.9 % IV SOLN
INTRAVENOUS | Status: AC
  Administered 2016-11-11 – 2016-11-12 (×3): via INTRAVENOUS

## 2016-11-11 MED ORDER — IPRATROPIUM-ALBUTEROL 0.5-2.5 (3) MG/3ML IN SOLN
3.0000 mL | RESPIRATORY_TRACT | Status: DC | PRN
  Administered 2016-11-11: 3 mL via RESPIRATORY_TRACT
  Filled 2016-11-11: qty 3

## 2016-11-11 NOTE — Progress Notes (Addendum)
[place] [facility] [place] NAME: Erica [name]    MR#:  [id number]  DATE OF BIRTH:  12/31/1930  SUBJECTIVE:  CHIEF COMPLAINT:   Chief Complaint  Patient presents with  . Shortness of Breath  Her best friend is at the bedside, she is waiting for physical therapy evaluation, sodium continues to drop REVIEW OF SYSTEMS:  Review of Systems  Constitutional: Positive for malaise/fatigue. Negative for chills, fever and weight loss.  HENT: Positive for hearing loss. Negative for nosebleeds and sore throat.   Eyes: Negative for blurred vision.  Respiratory: Positive for cough and shortness of breath. Negative for wheezing.   Cardiovascular: Negative for chest pain, orthopnea, leg swelling and PND.  Gastrointestinal: Negative for abdominal pain, constipation, diarrhea, heartburn, nausea and vomiting.  Genitourinary: Negative for dysuria and urgency.  Musculoskeletal: Negative for back pain.  Skin: Negative for rash.  Neurological: Positive for weakness. Negative for dizziness, speech change, focal weakness and headaches.  Endo/Heme/Allergies: Does not bruise/bleed easily.  Psychiatric/Behavioral: Negative for depression. The patient is nervous/anxious.    DRUG ALLERGIES:   Allergies  Allergen Reactions  . Ciprofloxacin Anaphylaxis, Hives and Other (See Comments)    pruritis  . Ace Inhibitors Other (See Comments)    Leg cramps  . Advair Diskus [Fluticasone-Salmeterol] Other (See Comments)    Tongue broke out with bumps  . Amlodipine-Atorvastatin Other (See Comments)    Muscle pain  . Atorvastatin Other (See Comments)    Muscle cramps  . Cefdinir Other (See Comments)  . Amoxicillin Itching, Rash and Other (See Comments)    Has patient had a PCN reaction causing immediate rash, facial/tongue/throat swelling, SOB or lightheadedness with hypotension: No Has patient had a PCN reaction causing severe rash involving mucus membranes or skin necrosis:  No Has patient had a PCN reaction that required hospitalization No Has patient had a PCN reaction occurring within the last 10 years: Yes If all of the above answers are "NO", then may proceed with Cephalosporin use.  . Quinolones Itching and Rash   VITALS:  Blood pressure 138/62, pulse 87, temperature 97.5 F (36.4 C), temperature source Oral, resp. rate (!) 34, height 5' (1.524 m), weight 62.4 kg (137 lb 8 oz), SpO2 96 %. PHYSICAL EXAMINATION:  Physical Exam  Constitutional: She is oriented to person, place, and time and well-developed, well-nourished, and in no distress.  HENT:  Head: Normocephalic and atraumatic.  Eyes: Conjunctivae and EOM are normal. Pupils are equal, round, and reactive to light.  Neck: Normal range of motion. Neck supple. No tracheal deviation present. No thyromegaly present.  Cardiovascular: Normal rate, regular rhythm and normal heart sounds.   Pulmonary/Chest: She has wheezes. She exhibits no tenderness.  Abdominal: Soft. Bowel sounds are normal. She exhibits no distension. There is no tenderness.  Musculoskeletal: Normal range of motion.  Neurological: She is alert and oriented to person, place, and time. No cranial nerve deficit.  Skin: Skin is warm and dry. No rash noted.   LABORATORY PANEL:   CBC  Recent Labs Lab 11/11/16 0352  WBC 6.8  HGB 11.8*  HCT 33.4*  PLT 210   ------------------------------------------------------------------------------------------------------------------ Chemistries   Recent Labs Lab 11/11/16 0352  NA 127*  K 4.7  CL 97*  CO2 25  GLUCOSE 141*  BUN 25*  CREATININE 0.98  CALCIUM 8.2*   RADIOLOGY:  No results found. ASSESSMENT AND PLAN:  Patient is a 82 year old with history of new diagnosis of pulmonary fibrosis presenting  with shortness of breath and cough  * Acute hypoxic respiratory failure due to flare of interstitial lung disease and bronchitis - continue nebulizers and IV doxycycline. - Stopping  steroids has helped. - continue mucolytics  * Hyponatremia - Hold Lasix and give IV hydration - Monitor sodium - Sodium continues to drop from 129-> 127 - Consult nephrology  * Panic attacks - Use Ativan as needed and stopped steroids  * Hemoptysis/epistaxis - Can be from constant coughing and oxygenation tube leading to mouth being dry, she was also on aspirin, Plavix and Lovenox, which has been stopped now  - Not having hemoptysis anymore  * Urinary retention: resolved - removed Foley catheter  * Thyroid nodules - Confirmed by thyroid ultrasound, will need to have FNAC as an outpatient for further evaluation and rule out malignancy  * Coronary artery disease - stable, hold aspirin and Plavix due to hemoptysis  * Hypothyrodism continue Synthroid  * Hyperlipidemia unspecified continue Pravachol  * Miscellaneous- Hold Lovenox due to hemoptysis    Physical therapy consultation, Likely needs rehab placement  All the records are reviewed and case discussed with Care Management/Social Worker. Management plans discussed with the patient, family (Discussed with her friend at bedside) and they are in agreement.   CODE STATUS: DO NOT RESUSCITATE  TOTAL TIME TAKING CARE OF THIS PATIENT: 35 minutes.   More than 50% of the time was spent in counseling/coordination of care: YES  POSSIBLE D/C IN 1-2 DAYS, DEPENDING ON CLINICAL CONDITION. Likely Needs rehab   [name] M.D on 11/11/2016 at 12:28 PM  Between 7am to 6pm - Pager - [phone]  After 6pm go to [email or web address] - Proofreader  [facility]  Office  [phone]  CC: Primary care physician; Kirk Ruths., MD  Note: This dictation was prepared with Dragon dictation along with smaller phrase technology. Any transcriptional errors that result from this process are unintentional.

## 2016-11-11 NOTE — Progress Notes (Signed)
Family Meeting Note  Advance Directive:yes  Today a meeting took place with the Patient and son in law.  The following clinical team members were present during this meeting:MD  The following were discussed:Patient's diagnosis: , Patient's progosis: < 12 months and Goals for treatment: DNR  Additional follow-up to be provided: Palliative care c/s  Time spent during discussion:20 minutes  [name], MD

## 2016-11-11 NOTE — Progress Notes (Signed)
OT Cancellation Note  Patient Details Name: Demiyah Dogan MRN: [id number] DOB: 1931-07-19   Cancelled Treatment:    Reason Eval/Treat Not Completed: Medical issues which prohibited therapy.  Pt continues with high and increasing troponin levels (currently .08).  Hold OT evaluation per protocol.  Will reassess tomorrow and provide evaluation if medically stable.  Thank you for the referral.  Chrys Racer, OTR/L ascom [phone] 11/11/16, 10:58 AM

## 2016-11-11 NOTE — Progress Notes (Signed)
Spoke with dr. Manuella Ghazi to make aware patient remains restless, increase wob and wheezing. Prn duoneb given. Family at bedside, wants to keep patient comfortable. Unable to give ativan at this time currently q6h. Per md will place orders as needed. Will continue to monitor closely

## 2016-11-11 NOTE — NC FL2 (Signed)
[place] LEVEL OF CARE SCREENING TOOL     IDENTIFICATION  Patient Name: Erica [name] Birthdate: 1931-01-05 Sex: female Admission Date (Current Location): 11/07/2016  [place] and [place] Number:  Engineering geologist and Address:  [facility], [address], [place], [place] [address]      Provider Number: [id number]  Attending Physician Name and Address:  [name], MD  Relative Name and Phone Number:  [facility] Daughter [phone] or Mariam Dollar    [phone]     Current Level of Care: Hospital Recommended Level of Care: [place] Prior Approval Number:    Date Approved/Denied:   PASRR Number: [id number]  Discharge Plan: SNF    Current Diagnoses: Patient Active Problem List   Diagnosis Date Noted  . Hemoptysis   . SOB (shortness of breath) 11/07/2016  . Interstitial lung disease ([place]) 10/28/2016  . Tachycardia 06/14/2016  . Chronic systolic congestive heart failure ([place])   . Dizziness 04/21/2016  . NSTEMI (non-ST elevated myocardial infarction) ([place]) 04/20/2016  . Hypothyroidism 04/10/2016  . COPD (chronic obstructive pulmonary disease) ([place]) 04/10/2016  . UTI (lower urinary tract infection) 04/10/2016  . Hyponatremia 04/10/2016  . Blind left eye   . Frequent falls   . LBBB (left bundle branch block)   . TIA (transient ischemic attack)   . Leg weakness, bilateral   . CVA (cerebral infarction) 03/12/2016  . Gait instability 01/08/2016  . Chronic systolic heart failure ([place]) 02/02/2015  . Weakness generalized 01/18/2014  . Acute bronchitis 12/28/2013  . Nonischemic cardiomyopathy ([place]) 12/28/2013  . Orthostatic hypotension 04/06/2012  . Biventricular implantable cardioverter-defibrillator in situ 04/04/2011  . HYPERLIPIDEMIA, MIXED 10/23/2009  . Essential hypertension 10/23/2009  . Coronary atherosclerosis 10/23/2009    Orientation RESPIRATION BLADDER Height & Weight     Self,  Time, Situation, Place  O2 (5 L) Incontinent Weight: 137 lb 8 oz (62.4 kg) Height:  5' (152.4 cm)  BEHAVIORAL SYMPTOMS/MOOD NEUROLOGICAL BOWEL NUTRITION STATUS      Continent Diet (2 gram sodium diet)  AMBULATORY STATUS COMMUNICATION OF NEEDS Skin   Extensive Assist Verbally Normal                       Personal Care Assistance Level of Assistance  Bathing, Feeding, Dressing Bathing Assistance: Limited assistance Feeding assistance: Independent Dressing Assistance: Limited assistance     Functional Limitations Therapist, nutritional, Speech Sight Info: Adequate Hearing Info: Adequate Speech Info: Adequate    SPECIAL CARE FACTORS FREQUENCY  PT (By licensed PT)     PT Frequency: 5x a week              Contractures Contractures Info: Not present    Additional Factors Info  Code Status, Allergies Code Status Info: DNR Allergies Info: CIPROFLOXACIN, ACE INHIBITORS, ADVAIR DISKUS FLUTICASONE-SALMETEROL, AMLODIPINE-ATORVASTATIN, ATORVASTATIN, CEFDINIR, AMOXICILLIN, QUINOLONES            Current Medications (11/11/2016):  This is the current hospital active medication list Current Facility-Administered Medications  Medication Dose Route Frequency Provider Last Rate Last Dose  . 0.9 %  sodium chloride infusion  250 mL Intravenous PRN Dustin Flock, MD      . acetaminophen (TYLENOL) tablet 650 mg  650 mg Oral Q6H PRN Dustin Flock, MD   650 mg at 11/08/16 1839   Or  . acetaminophen (TYLENOL) suppository 650 mg  650 mg Rectal Q6H PRN Dustin Flock, MD      . arformoterol (BROVANA) nebulizer solution  15 mcg  15 mcg Nebulization BID Wilhelmina Mcardle, MD   15 mcg at 11/11/16 [id number]  . budesonide (PULMICORT) nebulizer solution 0.25 mg  0.25 mg Nebulization BID Wilhelmina Mcardle, MD   0.25 mg at 11/11/16 0815  . cholecalciferol (VITAMIN D) tablet 2,000 Units  2,000 Units Oral QPC lunch Dustin Flock, MD   2,000 Units at 11/10/16 1343  . doxycycline (VIBRAMYCIN) 100 mg in dextrose 5 %  250 mL IVPB  100 mg Intravenous Q12H Dustin Flock, MD   100 mg at 11/11/16 0545  . enoxaparin (LOVENOX) injection 40 mg  40 mg Subcutaneous Q24H Dustin Flock, MD   40 mg at 11/10/16 2115  . famotidine (PEPCID) tablet 10 mg  10 mg Oral BID Dustin Flock, MD   10 mg at 11/11/16 0937  . guaiFENesin (MUCINEX) 12 hr tablet 600 mg  600 mg Oral BID Dustin Flock, MD   600 mg at 11/11/16 0936  . guaiFENesin-dextromethorphan (ROBITUSSIN DM) 100-10 MG/5ML syrup 15 mL  15 mL Oral Q4H PRN Dustin Flock, MD   15 mL at 11/10/16 1407  . levothyroxine (SYNTHROID, LEVOTHROID) tablet 75 mcg  75 mcg Oral QAC breakfast Dustin Flock, MD   75 mcg at 11/11/16 [phone]  . LORazepam (ATIVAN) tablet 1 mg  1 mg Oral Q6H PRN [name], MD   1 mg at 11/11/16 0548  . midodrine (PROAMATINE) tablet 2.5 mg  2.5 mg Oral BID WC Dustin Flock, MD   2.5 mg at 11/11/16 [id number]  . ondansetron (ZOFRAN) tablet 4 mg  4 mg Oral Q6H PRN Dustin Flock, MD       Or  . ondansetron (ZOFRAN) injection 4 mg  4 mg Intravenous Q6H PRN Dustin Flock, MD   4 mg at 11/09/16 1956  . oxymetazoline (AFRIN) 0.05 % nasal spray 1 spray  1 spray Each Nare BID Neville Route, MD   1 spray at 11/11/16 [phone]  . potassium chloride (K-DUR,KLOR-CON) CR tablet 10 mEq  10 mEq Oral Daily Dustin Flock, MD   10 mEq at 11/11/16 0938  . pravastatin (PRAVACHOL) tablet 10 mg  10 mg Oral QHS Dustin Flock, MD   10 mg at 11/10/16 2115  . sodium chloride (OCEAN) 0.65 % nasal spray 1 spray  1 spray Each Nare QID Neville Route, MD   1 spray at 11/11/16 [phone]  . sodium chloride flush (NS) 0.9 % injection 3 mL  3 mL Intravenous Q12H Dustin Flock, MD   3 mL at 11/11/16 0939  . sodium chloride flush (NS) 0.9 % injection 3 mL  3 mL Intravenous PRN Dustin Flock, MD         Discharge Medications: Please see discharge summary for a list of discharge medications.  Relevant Imaging Results:  Relevant Lab Results:   Additional Information SSN  [id number]  Ross Ludwig, [place]

## 2016-11-11 NOTE — Progress Notes (Signed)
This encounter was created in error - please disregard.

## 2016-11-11 NOTE — Progress Notes (Signed)
Patient has started having audible wheezing, more so than this AM. MD paged and new orders for PRN duonebs received. Will give treatment and continue to monitor respiratory status.

## 2016-11-11 NOTE — Consult Note (Signed)
   [facility] CM Inpatient Consult   11/11/2016  Chasta Deshpande 1931-11-01 [id number]   Met with the patient and son-in-law Montez Hageman regarding the benefits of [facility] services after receiving a referral from hospialist. Explained that [place] Management is a covered benefit of his [employer] insurance. Reviewed information for [facility] Care Management and a folder was provided with contact information.  Explained that [place] Management does not interfere with or replace any services arranged by the inpatient care management staff.  Patient and son-in-law requested liaison speak with daughter [name]. Made patient aware daughter [name] could call and I could explain any questions she may have. [facility]-office and Hospital Liaison's contact information given. Made patient and son-in-law aware [place] Management Social Worker would visit patient at the SNF to assist with discharge planning. For questions please contact:  Quintella Mura RN, [place] Hospital Liaison  [phone]) Business Mobile [phone]) Toll free office

## 2016-11-11 NOTE — Evaluation (Signed)
Physical Therapy Evaluation Patient Details Name: Erica [name] MRN: [id number] DOB: 1931/08/12 Today's Date: 11/11/2016   History of Present Illness  Erica [name]  is a 82 y.o. female with a known history of Congestive heart failure, coronary artery disease, dyslipidemia, hypertension, hypothyroidism and a diagnosis of pulmonary fibrosis last week was presenting with complaint of shortness of breath for the past 3 days. Patient reports that she has had cough and congestion ongoing for the past few days. Which is progressively gotten worse. Patient was brought to the ED. She was ambulated and her oxygen sats dropped. Into the 60s. She denies any chest pain palpitations or fever.  Clinical Impression  Pt admitted with above diagnosis. Pt currently with functional limitations due to the deficits listed below (see PT Problem List).  Pt with deconditioning and DOE. SaO2 drops to 87% on room air at rest. SaO2 applied to 5L/min and recovers to 93%. During ambulation SaO2 drops to 85% with 5L/min O2 and pt with DOE and increased RR. With seated rest break SaO2 recovers back to 92% on 5L/min O2. Pt will need supplemental O2 at discharge. Pt is unsteady on her feet requiring multiple corrections by therapist to prevent falls while walking. She will need SNF placement at discharge. Family is currently discussing whether pt will need a higher level of care at baseline. Pt will benefit from skilled PT services to address deficits in strength, balance, and mobility in order to return to full function at home.      Follow Up Recommendations SNF    Equipment Recommendations  None recommended by PT    Recommendations for Other Services       Precautions / Restrictions Precautions Precautions: Fall Restrictions Weight Bearing Restrictions: No      Mobility  Bed Mobility Overal bed mobility: Modified Independent             General bed mobility comments: HOB elevated and bed rails utilized with  increased time  Transfers Overall transfer level: Needs assistance Equipment used: Rolling walker (2 wheeled) Transfers: Sit to/from Stand Sit to Stand: Min guard         General transfer comment: Pt requires CGA for transfers to/from bed and commode. Once upright she is steady in standing with UE support on rolling walker. Pt requires slight increase in time to come to standing due to LE weakness  Ambulation/Gait Ambulation/Gait assistance: Min assist Ambulation Distance (Feet): 40 Feet Assistive device: Rolling walker (2 wheeled) Gait Pattern/deviations: Decreased step length - right;Decreased step length - left Gait velocity: Decreased Gait velocity interpretation: <1.8 ft/sec, indicative of risk for recurrent falls General Gait Details: Pt takes short step with decreased gait speed. Stability is impaired and pt has to provide minA+1 multiple times throhgout ambulation to prevent pt from falling. Poor safety awareness and poor reaction time. SaO2 drops to 87% on room air at rest. SaO2 applied to 5L/min and recovers to 93%. During ambulation SaO2 drops to 85% with 5L/min O2 and pt with DOE and increased RR. With seated rest break Sao2 recovers back to 92% on 5L/min O2. Pt will need supplemental O2 at discharge  Stairs            Wheelchair Mobility    Modified Rankin (Stroke Patients Only)       Balance Overall balance assessment: Needs assistance Sitting-balance support: No upper extremity supported Sitting balance-Leahy Scale: Good     Standing balance support: No upper extremity supported Standing balance-Leahy Scale: Fair Standing balance  comment: Positive Rhomberg, single leg stance <2 seconds bilaterally                             Pertinent Vitals/Pain Pain Assessment: No/denies pain    Home Living Family/patient expects to be discharged to:: Private residence Living Arrangements: Children Available Help at Discharge: Family;Available  PRN/intermittently Type of Home: Apartment Home Access: Level entry     Home Layout: One level Home Equipment: Shower seat - built in;Walker - 4 wheels;Walker - 2 wheels;Wheelchair - manual      Prior Function Level of Independence: Needs assistance   Gait / Transfers Assistance Needed: Household distances only with 2-3 falls over the last 12 months.   ADL's / Homemaking Assistance Needed: Requiring assist recently with bathing from daughter in law. Son reports gradually decreasing function over the last 6-12 months  Comments: Pt has episodes of orthostatic hypotension     Hand Dominance   Dominant Hand: Right    Extremity/Trunk Assessment   Upper Extremity Assessment Upper Extremity Assessment: Overall WFL for tasks assessed    Lower Extremity Assessment Lower Extremity Assessment: Generalized weakness       Communication   Communication: No difficulties  Cognition Arousal/Alertness: Awake/alert Behavior During Therapy: WFL for tasks assessed/performed Overall Cognitive Status: Within Functional Limits for tasks assessed                      General Comments      Exercises     Assessment/Plan    PT Assessment Patient needs continued PT services  PT Problem List Decreased strength;Decreased activity tolerance;Decreased balance;Decreased mobility;Decreased knowledge of use of DME;Decreased safety awareness;Cardiopulmonary status limiting activity          PT Treatment Interventions DME instruction;Gait training;Functional mobility training;Therapeutic activities;Therapeutic exercise;Balance training;Neuromuscular re-education;Patient/family education    PT Goals (Current goals can be found in the Care Plan section)  Acute Rehab PT Goals Patient Stated Goal: Return to prior level of function PT Goal Formulation: With patient/family Time For Goal Achievement: 11/25/16 Potential to Achieve Goals: Fair    Frequency Min 2X/week   Barriers to  discharge Decreased caregiver support Family unable to provide 24/7 care    Co-evaluation               End of Session Equipment Utilized During Treatment: Gait belt;Oxygen Activity Tolerance: Patient tolerated treatment well Patient left: in bed;with call bell/phone within reach;with bed alarm set;with family/visitor present           Time: 1532-1600 PT Time Calculation (min) (ACUTE ONLY): 28 min   Charges:   PT Evaluation $PT Eval Low Complexity: 1 Procedure PT Treatments $Gait Training: 8-22 mins   PT G Codes:       Lyndel Safe Huprich PT, DPT   Huprich,Jason 11/11/2016, 5:25 PM

## 2016-11-11 NOTE — Progress Notes (Signed)
Patient resting in bed, a@o  . No telemetry. No c/o pain iv left hand and left ac. C/o cough prn cough meds given. Lungs clear, diminished right base. Sob at rest. Sat 98 on 5l. 02 decreased to 4l, will continue to monitor closely

## 2016-11-11 NOTE — Progress Notes (Signed)
Patient's bed alarm sounded and upon staff responding, patient had a syncopal episode while attempting to get out bed; no fall or injury occurred. VSS and patient returned to baseline shortly after the event. Notified MD Hugelmeyer of the event and no new orders received. Nursing staff will continue to monitor for any changes in patient status. Earleen Reaper, RN

## 2016-11-11 NOTE — Care Management Important Message (Signed)
Important Message  Patient Details  Name: Treva Nase MRN: [id number] Date of Birth: January 04, 1931   Medicare Important Message Given:  Yes    Shelbie Ammons, RN 11/11/2016, 1:14 PM

## 2016-11-12 ENCOUNTER — Encounter
Admission: RE | Admit: 2016-11-12 | Discharge: 2016-11-12 | Disposition: A | Discharge status: Home / Self Care | Payer: PPO | Source: Ambulatory Visit | Attending: Internal Medicine | Admitting: Internal Medicine

## 2016-11-12 LAB — CBC
HCT: 32.5 % — ABNORMAL LOW (ref 35.0–47.0)
Hemoglobin: 11.2 g/dL — ABNORMAL LOW (ref 12.0–16.0)
MCH: 30.4 pg (ref 26.0–34.0)
MCHC: 34.5 g/dL (ref 32.0–36.0)
MCV: 88.2 fL (ref 80.0–100.0)
PLATELETS: 200 10*3/uL (ref 150–440)
RBC: 3.68 MIL/uL — AB (ref 3.80–5.20)
RDW: 15 % — ABNORMAL HIGH (ref 11.5–14.5)
WBC: 5.2 10*3/uL (ref 3.6–11.0)

## 2016-11-12 LAB — BASIC METABOLIC PANEL
ANION GAP: 5 (ref 5–15)
BUN: 19 mg/dL (ref 6–20)
CHLORIDE: 104 mmol/L (ref 101–111)
CO2: 25 mmol/L (ref 22–32)
Calcium: 8.3 mg/dL — ABNORMAL LOW (ref 8.9–10.3)
Creatinine, Ser: 0.87 mg/dL (ref 0.44–1.00)
GFR calc Af Amer: >60 mL/min (ref >60)
GFR, EST NON AFRICAN AMERICAN: 60 mL/min — AB (ref >60)
Glucose, Bld: 101 mg/dL — ABNORMAL HIGH (ref 65–99)
POTASSIUM: 4.5 mmol/L (ref 3.5–5.1)
SODIUM: 134 mmol/L — AB (ref 135–145)

## 2016-11-12 MED ORDER — MORPHINE SULFATE (CONCENTRATE) 10 MG/0.5ML PO SOLN: 5.0000 mg | ORAL | Status: DC | PRN

## 2016-11-12 MED ORDER — SENNA 8.6 MG PO TABS
1.0000 | ORAL_TABLET | Freq: Every day | ORAL | Status: DC
  Administered 2016-11-12: 8.6 mg via ORAL
  Filled 2016-11-12: qty 1

## 2016-11-12 MED ORDER — GUAIFENESIN ER 600 MG PO TB12: 600.0000 mg | ORAL_TABLET | Freq: Two times a day (BID) | ORAL | 0 refills | Status: DC

## 2016-11-12 MED ORDER — BISACODYL 10 MG RE SUPP: 10.0000 mg | Freq: Every day | RECTAL | Status: DC | PRN

## 2016-11-12 MED ORDER — LORAZEPAM 1 MG PO TABS: 1.0000 mg | ORAL_TABLET | ORAL | 0 refills | Status: AC | PRN

## 2016-11-12 MED ORDER — MORPHINE SULFATE (CONCENTRATE) 10 MG/0.5ML PO SOLN
2.5000 mg | ORAL | Status: DC | PRN
  Administered 2016-11-12: 2.6 mg via SUBLINGUAL
  Filled 2016-11-12: qty 1

## 2016-11-12 MED ORDER — MORPHINE SULFATE (CONCENTRATE) 10 MG/0.5ML PO SOLN
5.0000 mg | Freq: Four times a day (QID) | ORAL | Status: DC
  Administered 2016-11-12 – 2016-11-13 (×2): 5 mg via ORAL
  Filled 2016-11-12 (×2): qty 1

## 2016-11-12 MED ORDER — MORPHINE SULFATE (CONCENTRATE) 10 MG/0.5ML PO SOLN: 5.0000 mg | ORAL | 0 refills | Status: AC | PRN

## 2016-11-12 MED ORDER — MORPHINE SULFATE (CONCENTRATE) 10 MG/0.5ML PO SOLN
5.0000 mg | ORAL | Status: DC | PRN
  Administered 2016-11-12: 5 mg via SUBLINGUAL
  Filled 2016-11-12: qty 1

## 2016-11-12 MED ORDER — MORPHINE SULFATE (PF) 4 MG/ML IV SOLN
1.0000 mg | INTRAVENOUS | Status: DC | PRN
  Filled 2016-11-12: qty 1

## 2016-11-12 NOTE — Progress Notes (Signed)
New referral for Palliative services to follow at [facility] following discharge received from Vinie Sill NP. [place] made aware. Palliative referral made aware. Thank you. Flo Shanks RN, BSN, [place] and Palliative Care of [place], [facility] [phone] c

## 2016-11-12 NOTE — Progress Notes (Signed)
New hospice home referral received from [facility], patient was Larena Glassman to discharge to [facility] for short term rehab with Palliative services to follow, however she was reevaluated this afternoon by Palliative and after a further conversation with Mrs. Sopko and herdaughter the decision was made to focus on comfort/symptom management with transfer to the hospice home. Mrs. Minix is an 82 year old woman admitted to [facility] on 12/22 for assessment of dyspnea. She has a known history of CAD, CHF, HTN and hypothyroidism and pulmonary fibrosis. She has been treated for acute respiratory failure with IV steroids and IV doxycycline. She is currently requiring 3 liters of oxygen via nasal cannula and has had decreased oral intake over the last few days. Scheduled liquid morphine 5 mg q 6 hrs was initiated today for dyspnea and she has required a dose of lorazepam 1 mg for anxiety. Patient seen sitting up in bed, alert, appeared to have difficulty answering questions and appeared dyspneic with conversation. Writer met in the family room with patient's daughter [name] and son in law Elta Guadeloupe to initiate education regarding hospice services, philosophy and team approach to care with good understanding voiced. Questions answered, consents signed. Plan is for discharge to the hospice home tomorrow 12/28 via [facility] with signed portable DNR in place. Hospital care team and family aware and in agreement. Writer to follow up in the morning to arrange transport. Thank you. Flo Shanks RN, BSN, [place] and Palliative Care of [place], [facility] [phone] c

## 2016-11-12 NOTE — Consult Note (Signed)
Consultation Note Date: 11/12/2016   Patient Name: Erica [name]  DOB: 01-26-1931  MRN: [id number]  Age / Sex: 82 y.o., female  PCP: Kirk Ruths, MD Referring Physician: [name], MD  Reason for Consultation: Establishing goals of care  HPI/Patient Profile: 82 y.o. female  with past medical history of heart failure, CAD, dyslipidemia, HTN, hypothyroidism, and new official diagnosis of pulmonary fibrosis (although has had progressive respiratory decline for 2 years now) admitted on 11/07/2016 with worsening SOB, congested cough, hypoxia.   Clinical Assessment and Goals of Care: I met today with [name] and her daughter, [name]. [name] was worried that her mother is in denial about dying. When I spoke privately with [name] she was able to tell me that she knows that her lung disease is terminal. She has strong faith and is Jehovah's Witness and says she isn't afraid to die. She worries mostly about how her children and grandchildren will be when she dies. She says that she doesn't want to die but knows that this is inevitable and that when that time comes she is okay with it. Speaks of QOL already declining and wishes she were able to get out more like she used too. Agrees with DNR. All agree that comfort care is the best option for her. They do not want aggressive care. She is quite labored during our conversation at rest - discussed benefit of morphine for dyspnea.   Late entry: When I came back to check with [name] (RN stated she was having much anxiety and dyspnea today) family was gathered around. [name] says that she has been very tearful and anxious since our conversation (now is sleeping) but has told [name] that she just wants to rest and sleep. We revisit plan for SNF as family tells me that she has not really been eating or drinking anything - she did not eat breakfast this morning but only had a  few sips of coffee. She has been receiving IVF at 75 mL/hr as well for the past 2 days. Family says she attempted to eat a couple bites of lunch but it was too difficult for her. Family wishes to reconsider d/c plan and are hopeful for hospice facility now that [name] has been able to come more to terms with EOL. They do want aggressive symptom management to ensure comfort. I do believe with this information that prognosis could be < 2 weeks with poor intake and worsening dyspnea/anxiety.   Primary Decision Maker NEXT OF KIN daughter [name] at bedside    SUMMARY OF RECOMMENDATIONS   - DNR, comfort is priority - Hopeful for hospice facility  Code Status/Advance Care Planning:  DNR   Symptom Management:   Dyspnea: Roxanol 5 mg every 6 hours prn. Roxanol 5 mg every hour prn.   Anxiety: Lorazepam 1 mg every 4 hours prn.   Palliative Prophylaxis:   Aspiration, Bowel Regimen, Delirium Protocol, Frequent Pain Assessment, Oral Care and Turn Reposition  Additional Recommendations (Limitations, Scope, Preferences):  Full Comfort  Care  Psycho-social/Spiritual:   Desire for further Chaplaincy support:yes  Additional Recommendations: Caregiving  Support/Resources, Education on Hospice and Grief/Bereavement Support  Prognosis:   < 2 weeks  Discharge Planning: Hospice facility      Primary Diagnoses: Present on Admission: . SOB (shortness of breath)   I have reviewed the medical record, interviewed the patient and family, and examined the patient. The following aspects are pertinent.  Past Medical History:  Diagnosis Date  . Basal cell carcinoma 2011  . Blind left eye   . Breast cancer ([place]) 30+ yrs. ago   right breast, radiation  . CHF (congestive heart failure) ([place])   . COPD (chronic obstructive pulmonary disease) ([place])   . Coronary artery disease    a. s/p prior stent-RCA; b. cath 6/14: mild to mod prox to mid LAD dz, mod prox to dRCA at 60% unchanged from prior cath;  c. nuc 3/16: no sig ischemia, nl WM, EF 19%; d. nuc 6/17: intermediate risk; e. cath 6/17 LM 20%, p-oLAD 20%, ostLCx 30%, ost/pRCA 90% s/p PCI/DES, dRCA 80% s/p PCI/DES  . Dyslipidemia   . Frequent falls   . History of MI (myocardial infarction)   . Hypertension   . Hypothyroidism   . Ischemic cardiomyopathy    a. s/p Medtronic CRT-D, NYHA II-III at baseline; b. echo 2011: EF 43%, mildly dilated LA, PASP 33m Hg; c. echo 3/16: EF 35-40%, GR1DD, mild to mod AI, mild MR, PASP 65 mmHg  . LBBB (left bundle branch block)   . Orthostatic hypotension   . Respiratory infection   . TIA (transient ischemic attack) 02/2016   a. negative head CT x 2, unable to have MRI brain given ICD; b. carotid doppler with 0-49% stenosis bilaterally with antegrade flow bilaterally   . Vitamin D deficiency    Social History   Social History  . Marital status: Widowed    Spouse name: N/A  . Number of children: N/A  . Years of education: N/A   Social History Main Topics  . Smoking status: Former SResearch scientist (life sciences) . Smokeless tobacco: Never Used  . Alcohol use No  . Drug use: No  . Sexual activity: Not Asked   Other Topics Concern  . None   Social History Narrative  . None   Family History  Problem Relation Age of Onset  . Hypertension Mother   . Stroke Mother   . Hypertension Father   . Stroke Father   . Cancer Other   . Coronary artery disease Other   . Hypertension Other    Scheduled Meds: . arformoterol  15 mcg Nebulization BID  . budesonide (PULMICORT) nebulizer solution  0.25 mg Nebulization BID  . cholecalciferol  2,000 Units Oral QPC lunch  . doxycycline (VIBRAMYCIN) IV  100 mg Intravenous Q12H  . enoxaparin (LOVENOX) injection  40 mg Subcutaneous Q24H  . famotidine  10 mg Oral BID  . guaiFENesin  600 mg Oral BID  . levothyroxine  75 mcg Oral QAC breakfast  . midodrine  2.5 mg Oral BID WC  . potassium chloride  10 mEq Oral Daily  . pravastatin  10 mg Oral QHS  . sodium chloride  1 spray Each  Nare QID  . sodium chloride flush  3 mL Intravenous Q12H   Continuous Infusions: . sodium chloride 75 mL/hr at 11/12/16 0218   PRN Meds:.sodium chloride, acetaminophen **OR** acetaminophen, guaiFENesin-dextromethorphan, ipratropium-albuterol, LORazepam, morphine injection, ondansetron **OR** ondansetron (ZOFRAN) IV, sodium chloride flush Allergies  Allergen Reactions  .  Ciprofloxacin Anaphylaxis, Hives and Other (See Comments)    pruritis  . Ace Inhibitors Other (See Comments)    Leg cramps  . Advair Diskus [Fluticasone-Salmeterol] Other (See Comments)    Tongue broke out with bumps  . Amlodipine-Atorvastatin Other (See Comments)    Muscle pain  . Atorvastatin Other (See Comments)    Muscle cramps  . Cefdinir Other (See Comments)  . Amoxicillin Itching, Rash and Other (See Comments)    Has patient had a PCN reaction causing immediate rash, facial/tongue/throat swelling, SOB or lightheadedness with hypotension: No Has patient had a PCN reaction causing severe rash involving mucus membranes or skin necrosis: No Has patient had a PCN reaction that required hospitalization No Has patient had a PCN reaction occurring within the last 10 years: Yes If all of the above answers are "NO", then may proceed with Cephalosporin use.  . Quinolones Itching and Rash   Review of Systems  Constitutional: Positive for activity change, appetite change and fatigue.  Respiratory: Positive for cough and shortness of breath.   Neurological: Positive for weakness.    Physical Exam  Constitutional: She is oriented to person, place, and time. She appears well-developed.  HENT:  Head: Normocephalic and atraumatic.  Cardiovascular: Normal rate.   Pulmonary/Chest: No accessory muscle usage. Tachypnea noted. She is in respiratory distress. She has wheezes.  Abdominal: Soft. Normal appearance.  Neurological: She is alert and oriented to person, place, and time.  Hard of hearing  Nursing note and vitals  reviewed.   Vital Signs: BP (!) 136/58 (BP Location: Left Arm)   Pulse 87   Temp 97.4 F (36.3 C) (Oral)   Resp 18   Ht 5' (1.524 m)   Wt 62.4 kg (137 lb 8 oz)   SpO2 92%   BMI 26.85 kg/m  Pain Assessment: 0-10   Pain Score: Asleep   SpO2: SpO2: 92 % O2 Device:SpO2: 92 % O2 Flow Rate: .O2 Flow Rate (L/min): 3 L/min  IO: Intake/output summary:  Intake/Output Summary (Last 24 hours) at 11/12/16 0930 Last data filed at 11/12/16 0739  Gross per 24 hour  Intake            932.5 ml  Output              650 ml  Net            282.5 ml    LBM: Last BM Date: 11/08/16 Baseline Weight: Weight: 60.8 kg (134 lb) Most recent weight: Weight: 62.4 kg (137 lb 8 oz)     Palliative Assessment/Data: PPS: 30%     Time In: 0815 Time Out: 0930 Time Total: 13mn Greater than 50%  of this time was spent counseling and coordinating care related to the above assessment and plan.  Signed by: AVinie Sill NP Palliative Medicine Team Pager # [phone](M-F 8a-5p) Team Phone # [phone](Nights/Weekends)

## 2016-11-12 NOTE — Progress Notes (Signed)
Assumed care at 2:30 from [place]. Patient is asleep.Daughter requested to not activate bed alarm.

## 2016-11-12 NOTE — Progress Notes (Signed)
[facility] [place] NAME: Peytin Ewton    MR#:  [id number]  DATE OF BIRTH:  03-31-1931  SUBJECTIVE:  CHIEF COMPLAINT:   Chief Complaint  Patient presents with  . Shortness of Breath  Still coughing a lot, gets hypoxic on minimal ambulation.  All family at bedside REVIEW OF SYSTEMS:  Review of Systems  Constitutional: Positive for malaise/fatigue. Negative for chills, fever and weight loss.  HENT: Positive for hearing loss. Negative for nosebleeds and sore throat.   Eyes: Negative for blurred vision.  Respiratory: Positive for cough and shortness of breath. Negative for wheezing.   Cardiovascular: Negative for chest pain, orthopnea, leg swelling and PND.  Gastrointestinal: Negative for abdominal pain, constipation, diarrhea, heartburn, nausea and vomiting.  Genitourinary: Negative for dysuria and urgency.  Musculoskeletal: Negative for back pain.  Skin: Negative for rash.  Neurological: Positive for weakness. Negative for dizziness, speech change, focal weakness and headaches.  Endo/Heme/Allergies: Does not bruise/bleed easily.  Psychiatric/Behavioral: Negative for depression. The patient is nervous/anxious.    DRUG ALLERGIES:   Allergies  Allergen Reactions  . Ciprofloxacin Anaphylaxis, Hives and Other (See Comments)    pruritis  . Ace Inhibitors Other (See Comments)    Leg cramps  . Advair Diskus [Fluticasone-Salmeterol] Other (See Comments)    Tongue broke out with bumps  . Amlodipine-Atorvastatin Other (See Comments)    Muscle pain  . Atorvastatin Other (See Comments)    Muscle cramps  . Cefdinir Other (See Comments)  . Amoxicillin Itching, Rash and Other (See Comments)    Has patient had a PCN reaction causing immediate rash, facial/tongue/throat swelling, SOB or lightheadedness with hypotension: No Has patient had a PCN reaction causing severe rash involving mucus membranes or skin necrosis: No Has patient had a PCN  reaction that required hospitalization No Has patient had a PCN reaction occurring within the last 10 years: Yes If all of the above answers are "NO", then may proceed with Cephalosporin use.  . Quinolones Itching and Rash   VITALS:  Blood pressure (!) 117/54, pulse 99, temperature 98.2 F (36.8 C), temperature source Oral, resp. rate 19, height 5' (1.524 m), weight 62.4 kg (137 lb 8 oz), SpO2 95 %. PHYSICAL EXAMINATION:  Physical Exam  Constitutional: She is oriented to person, place, and time and well-developed, well-nourished, and in no distress.  HENT:  Head: Normocephalic and atraumatic.  Eyes: Conjunctivae and EOM are normal. Pupils are equal, round, and reactive to light.  Neck: Normal range of motion. Neck supple. No tracheal deviation present. No thyromegaly present.  Cardiovascular: Normal rate, regular rhythm and normal heart sounds.   Pulmonary/Chest: She has wheezes. She exhibits no tenderness.  Abdominal: Soft. Bowel sounds are normal. She exhibits no distension. There is no tenderness.  Musculoskeletal: Normal range of motion.  Neurological: She is alert and oriented to person, place, and time. No cranial nerve deficit.  Skin: Skin is warm and dry. No rash noted.   LABORATORY PANEL:   CBC  Recent Labs Lab 11/12/16 0509  WBC 5.2  HGB 11.2*  HCT 32.5*  PLT 200   ------------------------------------------------------------------------------------------------------------------ Chemistries   Recent Labs Lab 11/12/16 0509  NA 134*  K 4.5  CL 104  CO2 25  GLUCOSE 101*  BUN 19  CREATININE 0.87  CALCIUM 8.3*   RADIOLOGY:  No results found. ASSESSMENT AND PLAN:  Patient is a 82 year old with history of new diagnosis of pulmonary fibrosis presenting with shortness of breath  and cough  * Acute hypoxic respiratory failure due to flare of interstitial lung disease and bronchitis - Family is meeting with palliative care and it seems that they are leaning  towards going hospice home tomorrow  * Hyponatremia - Resolved with hydration  * Panic attacks - Use Ativan as needed and stopped steroids  * Hemoptysis/epistaxis - Resolved  * Urinary retention: resolved - removed Foley catheter  * Thyroid nodules - Confirmed by thyroid ultrasound, will need to have FNAC as an outpatient for further evaluation and rule out malignancy  * Coronary artery disease - stable, hold aspirin and Plavix due to hemoptysis  * Hypothyrodism continue Synthroid  * Hyperlipidemia unspecified continue Pravachol  * Miscellaneous- Hold Lovenox due to hemoptysis    Initially the plan was to go to [facility] placed for rehabilitation with palliative care following there although the latest discussion with palliative care meeting with family.  Now the plan is changed to get her to hospice home tomorrow  All the records are reviewed and case discussed with Care Management/Social Worker. Management plans discussed with the patient, family (Discussed with her friend at bedside) and they are in agreement.   CODE STATUS: DO NOT RESUSCITATE  TOTAL TIME TAKING CARE OF THIS PATIENT: 35 minutes.   More than 50% of the time was spent in counseling/coordination of care: YES  POSSIBLE D/C IN AM, DEPENDING ON CLINICAL CONDITION. Likely hospice home tomorrow   [name] M.D on 11/12/2016 at 2:46 PM  Between 7am to 6pm - Pager - [phone]  After 6pm go to [email or web address] - Proofreader  [facility]  Office  [phone]  CC: Primary care physician; Kirk Ruths., MD  Note: This dictation was prepared with Dragon dictation along with smaller phrase technology. Any transcriptional errors that result from this process are unintentional.

## 2016-11-12 NOTE — Discharge Summary (Addendum)
[facility] NAME: Erica [name]    MR#:  [id number]  DATE OF BIRTH:  [birth year]  DATE OF ADMISSION:  11/07/2016   ADMITTING PHYSICIAN: Dustin Flock, MD  DATE OF DISCHARGE: 11/13/2016  PRIMARY CARE PHYSICIAN: Kirk Ruths., MD   ADMISSION DIAGNOSIS:  Acute respiratory failure with hypoxia (HCC) [J96.01] DISCHARGE DIAGNOSIS:  Active Problems:   SOB (shortness of breath)   Hemoptysis  SECONDARY DIAGNOSIS:   Past Medical History:  Diagnosis Date  . Basal cell carcinoma 2011  . Blind left eye   . Breast cancer ([place]) 30+ yrs. ago   right breast, radiation  . CHF (congestive heart failure) ([place])   . COPD (chronic obstructive pulmonary disease) ([place])   . Coronary artery disease    a. s/p prior stent-RCA; b. cath 6/14: mild to mod prox to mid LAD dz, mod prox to dRCA at 60% unchanged from prior cath; c. nuc 3/16: no sig ischemia, nl WM, EF 19%; d. nuc 6/17: intermediate risk; e. cath 6/17 LM 20%, p-oLAD 20%, ostLCx 30%, ost/pRCA 90% s/p PCI/DES, dRCA 80% s/p PCI/DES  . Dyslipidemia   . Frequent falls   . History of MI (myocardial infarction)   . Hypertension   . Hypothyroidism   . Ischemic cardiomyopathy    a. s/p Medtronic CRT-D, NYHA II-III at baseline; b. echo 2011: EF 43%, mildly dilated LA, PASP 65mm Hg; c. echo 3/16: EF 35-40%, GR1DD, mild to mod AI, mild MR, PASP 65 mmHg  . LBBB (left bundle branch block)   . Orthostatic hypotension   . Respiratory infection   . TIA (transient ischemic attack) 02/2016   a. negative head CT x 2, unable to have MRI brain given ICD; b. carotid doppler with 0-49% stenosis bilaterally with antegrade flow bilaterally   . Vitamin D deficiency    HOSPITAL COURSE:  Patient is an 82 year old with history of new diagnosis of pulmonary fibrosis presenting with shortness of breath and cough  * Acute on chronic hypoxic respiratory failure due to flare of interstitial lung disease and  bronchitis - Going to hospice home  * Hyponatremia - Likely due to dehydration, now resolved  * Panic attacks - Use Ativan as needed  * Hemoptysis/epistaxis: Resolved now - Can be from constant coughing and oxygenation tube leading to mouth being dry  * Urinary retention: resolved - removed Foley catheter  * Thyroid nodules - Confirmed by thyroid ultrasound, will need to have FNAC as an outpatient for further evaluation and rule out malignancy  * Coronary artery disease - stable, stop Plavix due to hemoptysis.  And resume aspirin as an outpatient  * Hypothyrodismcontinue Synthroid  * Hyperlipidemia unspecified continue Pravachol Family and patient has decided to go hospice home for comfort care DISCHARGE CONDITIONS:  fair CONSULTS OBTAINED:  Treatment Team:  Neville Route, MD Lavonia Dana, MD DRUG ALLERGIES:   Allergies  Allergen Reactions  . Ciprofloxacin Anaphylaxis, Hives and Other (See Comments)    pruritis  . Ace Inhibitors Other (See Comments)    Leg cramps  . Advair Diskus [Fluticasone-Salmeterol] Other (See Comments)    Tongue broke out with bumps  . Amlodipine-Atorvastatin Other (See Comments)    Muscle pain  . Atorvastatin Other (See Comments)    Muscle cramps  . Cefdinir Other (See Comments)  . Amoxicillin Itching, Rash and Other (See Comments)    Has patient had a PCN reaction causing immediate rash, facial/tongue/throat swelling, SOB or lightheadedness  with hypotension: No Has patient had a PCN reaction causing severe rash involving mucus membranes or skin necrosis: No Has patient had a PCN reaction that required hospitalization No Has patient had a PCN reaction occurring within the last 10 years: Yes If all of the above answers are "NO", then may proceed with Cephalosporin use.  . Quinolones Itching and Rash   DISCHARGE MEDICATIONS:   Allergies as of 11/12/2016      Reactions   Ciprofloxacin Anaphylaxis, Hives, Other (See Comments)    pruritis   Ace Inhibitors Other (See Comments)   Leg cramps   Advair Diskus [fluticasone-salmeterol] Other (See Comments)   Tongue broke out with bumps   Amlodipine-atorvastatin Other (See Comments)   Muscle pain   Atorvastatin Other (See Comments)   Muscle cramps   Cefdinir Other (See Comments)   Amoxicillin Itching, Rash, Other (See Comments)   Has patient had a PCN reaction causing immediate rash, facial/tongue/throat swelling, SOB or lightheadedness with hypotension: No Has patient had a PCN reaction causing severe rash involving mucus membranes or skin necrosis: No Has patient had a PCN reaction that required hospitalization No Has patient had a PCN reaction occurring within the last 10 years: Yes If all of the above answers are "NO", then may proceed with Cephalosporin use.   Quinolones Itching, Rash      Medication List    STOP taking these medications   clopidogrel 75 MG tablet Commonly known as:  PLAVIX   Potassium Gluconate 550 MG Tabs   pravastatin 10 MG tablet Commonly known as:  PRAVACHOL     TAKE these medications   acetaminophen 325 MG tablet Commonly known as:  TYLENOL Take 325 mg by mouth every 6 (six) hours as needed.   aspirin 81 MG chewable tablet Chew 1 tablet (81 mg total) by mouth daily.   Cholecalciferol 2000 units Caps Take 1 capsule by mouth daily after lunch.   furosemide 20 MG tablet Commonly known as:  LASIX Take 1 tablet (20 mg total) by mouth daily. What changed:  when to take this   guaiFENesin 600 MG 12 hr tablet Commonly known as:  MUCINEX Take 1 tablet (600 mg total) by mouth 2 (two) times daily.   levothyroxine 75 MCG tablet Commonly known as:  SYNTHROID, LEVOTHROID Take 75 mcg by mouth daily before breakfast.   LORazepam 1 MG tablet Commonly known as:  ATIVAN Take 1 tablet (1 mg total) by mouth every 4 (four) hours as needed for anxiety or sleep. What changed:  when to take this   midodrine 2.5 MG tablet Commonly known  as:  PROAMATINE Take 1 tablet (2.5 mg total) by mouth 2 (two) times daily with a meal.   morphine CONCENTRATE 10 MG/0.5ML Soln concentrated solution Place 0.25 mLs (5 mg total) under the tongue every 2 (two) hours as needed for shortness of breath.   MULTIVITAMIN GUMMIES ADULT PO Take 1 each by mouth daily after lunch.   potassium chloride 10 MEQ tablet Commonly known as:  K-DUR Take 1 tablet (10 mEq total) by mouth daily.   ranitidine 150 MG tablet Commonly known as:  ZANTAC Take 150 mg by mouth 2 (two) times daily.        DISCHARGE INSTRUCTIONS:   DIET:  Regular diet DISCHARGE CONDITION:  Good ACTIVITY:  Activity as tolerated OXYGEN:  Home Oxygen: Yes.    Oxygen Delivery: 2 liters/min via Patient connected to nasal cannula oxygen DISCHARGE LOCATION:  Hospice home  If you experience worsening  of your admission symptoms, develop shortness of breath, life threatening emergency, suicidal or homicidal thoughts you must seek medical attention immediately by calling 911 or calling your MD immediately  if symptoms less severe.  You Must read complete instructions/literature along with all the possible adverse reactions/side effects for all the Medicines you take and that have been prescribed to you. Take any new Medicines after you have completely understood and accpet all the possible adverse reactions/side effects.   Please note  You were cared for by a hospitalist during your hospital stay. If you have any questions about your discharge medications or the care you received while you were in the hospital after you are discharged, you can call the unit and asked to speak with the hospitalist on call if the hospitalist that took care of you is not available. Once you are discharged, your primary care physician will handle any further medical issues. Please note that NO REFILLS for any discharge medications will be authorized once you are discharged, as it is imperative that you  return to your primary care physician (or establish a relationship with a primary care physician if you do not have one) for your aftercare needs so that they can reassess your need for medications and monitor your lab values.    On the day of Discharge:  VITAL SIGNS:  Blood pressure (!) 117/54, pulse 99, temperature 98.2 F (36.8 C), temperature source Oral, resp. rate 19, height 5' (1.524 m), weight 62.4 kg (137 lb 8 oz), SpO2 95 %. PHYSICAL EXAMINATION:  GENERAL:  82 y.o.-year-old patient lying in the bed with no acute distress.  EYES: Pupils equal, round, reactive to light and accommodation. No scleral icterus. Extraocular muscles intact.  HEENT: Head atraumatic, normocephalic. Oropharynx and nasopharynx clear.  NECK:  Supple, no jugular venous distention. No thyroid enlargement, no tenderness.  LUNGS: Normal breath sounds bilaterally, + wheezing, rales,rhonchi or crepitation. No use of accessory muscles of respiration.  CARDIOVASCULAR: S1, S2 normal. No murmurs, rubs, or gallops.  ABDOMEN: Soft, non-tender, non-distended. Bowel sounds present. No organomegaly or mass.  EXTREMITIES: No pedal edema, cyanosis, or clubbing.  NEUROLOGIC: Cranial nerves II through XII are intact. Muscle strength 5/5 in all extremities. Sensation intact. Gait not checked.  PSYCHIATRIC: The patient is alert and oriented x 3.  SKIN: No obvious rash, lesion, or ulcer.  DATA REVIEW:   CBC  Recent Labs Lab 11/12/16 0509  WBC 5.2  HGB 11.2*  HCT 32.5*  PLT 200    Chemistries   Recent Labs Lab 11/12/16 0509  NA 134*  K 4.5  CL 104  CO2 25  GLUCOSE 101*  BUN 19  CREATININE 0.87  CALCIUM 8.3*    Follow-up Information    Kirk Ruths., MD. Schedule an appointment as soon as possible for a visit in 1 week(s).   Specialty:  Internal Medicine Contact information: [facility] [place] [address] [phone]        Judi Cong, MD. Schedule an  appointment as soon as possible for a visit in 2 week(s).   Specialty:  Endocrinology Why:  Thyroid mass - may need FNAC Contact information: [address] [place] [address] [phone]           Management plans discussed with the patient, family and they are in agreement.  CODE STATUS:DO NOT RESUSCITATE, comfort care and hospice   TOTAL TIME TAKING CARE OF THIS PATIENT: 45 minutes.  [name] M.D on 11/12/2016 at 11:59 AM  Between 7am to 6pm - Pager - [phone]  After 6pm go to [email or web address] - Proofreader  [facility]  Office  [phone]  CC: Primary care physician; Kirk Ruths., MD   Note: This dictation was prepared with Dragon dictation along with smaller phrase technology. Any transcriptional errors that result from this process are unintentional.

## 2016-11-12 NOTE — Clinical Social Work Note (Signed)
Clinical Social Work Assessment  Patient Details  Name: Erica [name] MRN: [id number] Date of Birth: 1930/12/06  Date of referral:  11/12/16               Reason for consult:  End of Life/Hospice                Permission sought to share information with:  Facility Sport and exercise psychologist, Family Supports Permission granted to share information::  Yes, Verbal Permission Granted  Name::     Wisniowski,Lori Daughter [phone] or Mariam Dollar    [phone]   Agency::  [facility]  Relationship::     Contact Information:     Housing/Transportation Living arrangements for the past 2 months:  [address] of Information:  Patient, Adult Children Patient Interpreter Needed:  None Criminal Activity/Legal Involvement Pertinent to Current Situation/Hospitalization:  No - Comment as needed Significant Relationships:  Adult Children Lives with:  Adult Children Do you feel safe going back to the place where you live?  No Need for family participation in patient care:  Yes (Comment)  Care giving concerns: Patient and family would like to go to [facility]   Social Worker assessment / plan:  Patient is a 82 year old female who lives with her daughter, patient is alert and oriented x4.  Patient was originally going to go to SNF for short term rehab, but due to patient's poor prognosis from meeting with palliative they have decided they would like hospice instead.  CSW spoke to patient and her family and they would like [facility].  CSW was given permission to give referral to [facility].  Employment status:  Retired Nurse, adult PT Recommendations:  [place] / Referral to community resources:  [place]  Patient/Family's Response to care:  Patient and family are in agreement to going to Hospice facility.  Patient/Family's  Understanding of and Emotional Response to Diagnosis, Current Treatment, and Prognosis:  Patient expressed sadness, but also stated she is tired of fighting.  Patient's family is supportive of her decision.  Emotional Assessment Appearance:  Appears stated age Attitude/Demeanor/Rapport:    Affect (typically observed):  Anxious, Appropriate, Stable Orientation:  Oriented to Self, Oriented to Place, Oriented to  Time, Oriented to Situation Alcohol / Substance use:  Not Applicable Psych involvement (Current and /or in the community):  No (Comment)  Discharge Needs  Concerns to be addressed:  Lack of Support Readmission within the last 30 days:  No Current discharge risk:  Lack of support system, Terminally ill Barriers to Discharge:  No Barriers Identified   Ross Ludwig, LCSWA 11/12/2016, 5:50 PM

## 2016-11-12 NOTE — Clinical Social Work Note (Addendum)
CSW presented bed offers to patient and their family, they chose [facility].  CSW contacted SNF and they are able to accept patient today once insurance authorization has been received.  CSW contacted insurance company who will begin insurance authorization for SNF placement.   3:10pm CSW was informed by palliative that patient and her family would rather go to [facility] [place] [place] contacted patient's family and spoke to the daughter Kerin Perna [phone] to confirm decision.  CSW contacted hospice nurse liason who will review patient's chart and meet with the family.  CSW was informed that there is not a bed available today, but there may be one tomorrow, CSW to continue to follow patient's progress throughout discharge planning.  Evette Cristal, MSW, LCSWA Mon-Fri 8a-4:30p [phone]

## 2016-11-13 DIAGNOSIS — F411 Generalized anxiety disorder: Secondary | ICD-10-CM

## 2016-11-13 DIAGNOSIS — Z7189 Other specified counseling: Secondary | ICD-10-CM

## 2016-11-13 DIAGNOSIS — Z515 Encounter for palliative care: Secondary | ICD-10-CM

## 2016-11-13 DIAGNOSIS — J9601 Acute respiratory failure with hypoxia: Secondary | ICD-10-CM

## 2016-11-13 LAB — GLUCOSE, CAPILLARY: Glucose-Capillary: 99 mg/dL (ref 65–99)

## 2016-11-13 MED ORDER — MORPHINE SULFATE (CONCENTRATE) 10 MG/0.5ML PO SOLN: 5.0000 mg | Freq: Four times a day (QID) | ORAL | 0 refills | Status: AC | PRN

## 2016-11-13 NOTE — Progress Notes (Signed)
Daily Progress Note   Patient Name: Erica [name]       Date: 11/13/2016 DOB: 1931/08/29  Age: 82 y.o. MRN#: [id number] Attending Physician: [name], MD Primary Care Physician: Kirk Ruths., MD Admit Date: 11/07/2016  Reason for Consultation/Follow-up: Establishing goals of care  Subjective: Erica [name] is lethargic today.   Length of Stay: 6  Current Medications: Scheduled Meds:  . arformoterol  15 mcg Nebulization BID  . budesonide (PULMICORT) nebulizer solution  0.25 mg Nebulization BID  . cholecalciferol  2,000 Units Oral QPC lunch  . doxycycline (VIBRAMYCIN) IV  100 mg Intravenous Q12H  . enoxaparin (LOVENOX) injection  40 mg Subcutaneous Q24H  . famotidine  10 mg Oral BID  . guaiFENesin  600 mg Oral BID  . levothyroxine  75 mcg Oral QAC breakfast  . midodrine  2.5 mg Oral BID WC  . morphine CONCENTRATE  5 mg Oral Q6H  . potassium chloride  10 mEq Oral Daily  . pravastatin  10 mg Oral QHS  . senna  1 tablet Oral QHS  . sodium chloride  1 spray Each Nare QID  . sodium chloride flush  3 mL Intravenous Q12H    Continuous Infusions:   PRN Meds: sodium chloride, acetaminophen **OR** acetaminophen, bisacodyl, guaiFENesin-dextromethorphan, ipratropium-albuterol, LORazepam, morphine injection, morphine CONCENTRATE, ondansetron **OR** ondansetron (ZOFRAN) IV, sodium chloride flush  Physical Exam  Constitutional: She appears well-developed. She appears lethargic.  HENT:  Head: Normocephalic and atraumatic.  Cardiovascular: Normal rate.   Pulmonary/Chest: No accessory muscle usage. No tachypnea. She is in respiratory distress. She has decreased breath sounds. She has wheezes.  Abdominal: Normal appearance.  Neurological: She appears lethargic.  Nursing note and  vitals reviewed.           Vital Signs: BP (!) 141/77 (BP Location: Left Arm)   Pulse 99   Temp 97.8 F (36.6 C) (Oral)   Resp 18   Ht 5' (1.524 m)   Wt 62.4 kg (137 lb 8 oz)   SpO2 95%   BMI 26.85 kg/m  SpO2: SpO2: 95 % O2 Device: O2 Device: Nasal Cannula O2 Flow Rate: O2 Flow Rate (L/min): 3 L/min  Intake/output summary:  Intake/Output Summary (Last 24 hours) at 11/13/16 0950 Last data filed at 11/13/16 0318  Gross per 24 hour  Intake  243 ml  Output             1050 ml  Net             -807 ml   LBM: Last BM Date: 11/08/16 Baseline Weight: Weight: 60.8 kg (134 lb) Most recent weight: Weight: 62.4 kg (137 lb 8 oz)       Palliative Assessment/Data:      Patient Active Problem List   Diagnosis Date Noted  . Hemoptysis   . SOB (shortness of breath) 11/07/2016  . Interstitial lung disease ([place]) 10/28/2016  . Tachycardia 06/14/2016  . Chronic systolic congestive heart failure ([place])   . Dizziness 04/21/2016  . NSTEMI (non-ST elevated myocardial infarction) ([place]) 04/20/2016  . Hypothyroidism 04/10/2016  . COPD (chronic obstructive pulmonary disease) ([place]) 04/10/2016  . UTI (lower urinary tract infection) 04/10/2016  . Hyponatremia 04/10/2016  . Blind left eye   . Frequent falls   . LBBB (left bundle branch block)   . TIA (transient ischemic attack)   . Leg weakness, bilateral   . CVA (cerebral infarction) 03/12/2016  . Gait instability 01/08/2016  . Chronic systolic heart failure ([place]) 02/02/2015  . Weakness generalized 01/18/2014  . Acute bronchitis 12/28/2013  . Nonischemic cardiomyopathy ([place]) 12/28/2013  . Orthostatic hypotension 04/06/2012  . Biventricular implantable cardioverter-defibrillator in situ 04/04/2011  . HYPERLIPIDEMIA, MIXED 10/23/2009  . Essential hypertension 10/23/2009  . Coronary atherosclerosis 10/23/2009    Palliative Care Assessment & Plan   HPI: 82 y.o. female  with past medical history of heart failure, CAD,  dyslipidemia, HTN, hypothyroidism, and new official diagnosis of pulmonary fibrosis (although has had progressive respiratory decline for 2 years now) admitted on 11/07/2016 with worsening SOB, congested cough, hypoxia. Continues with resp distress, cough, and severe anxiety.   Assessment: Erica [name] has continued to need many prns for dyspnea and anxiety since our conversation yesterday. She has been tearful and anxious when awake. She seems to be either awake and anxious/miserable or sedated and asleep. Family wants her to be comfortable and wishing to consider hospice at this point. Erica [name] was more hopeful for rehab yesterday but after further discussion they are requesting hospice facility. With the complex symptom management required I agree with this option. Erica [name] is lethargic but daughter Erica [name] has discussed this with Erica [name].   Recommendations/Plan:  Dyspnea: Roxanol 5 mg every 6 hours. Roxanol 5 mg every hour prn.   Anxiety: Lorazepam 1 mg every 4 hours prn.   Goals of Care and Additional Recommendations:  Limitations on Scope of Treatment: Full Comfort Care  Code Status:  DNR  Prognosis:   Hours - Days  Discharge Planning:  Hospice facility   Thank you for allowing the Palliative Medicine Team to assist in the care of this patient.   Total Time 60min Prolonged Time Billed  no       Greater than 50%  of this time was spent counseling and coordinating care related to the above assessment and plan.  Erica Sill, NP Palliative Medicine Team Pager # [phone] (M-F 8a-5p) Team Phone # [phone] (Nights/Weekends)

## 2016-11-13 NOTE — Progress Notes (Signed)
Patient prepared for transport, Given PO guafenesin and PO morphine. Patient having difficulty swallowing PO pills whole and displaying severe dyspnea at rest. Patient and family present in room, emotional support provided as family coming to terms with patient's prognosis. Patient tearful as well. Time spent with patient allowing her to express her gratitude and sadness. Report given to EMS upon arrival and vital sign taken. IV dc'ed, catheter intact- dressing applied- no bleeding noted to site.  Patient to be transported to [facility] with family.

## 2016-11-13 NOTE — Clinical Social Work Note (Signed)
Patient to be d/c'ed today to [facility].  Patient and family agreeable to plans will transport via ems RN to call report.  Evette Cristal, MSW, LCSWA Mon-Fri 8a-4:30p [phone]

## 2016-11-13 NOTE — Consult Note (Signed)
   [facility] Inpatient Consult   11/13/2016  Erica [name] 1931-03-17 [id number]   Made aware by referring physician patient has accepted Hospice services. [facility] Care Management services not appropriate at this time. If patient's post hospital needs change please place a [facility] Care Management consult. For questions please contact:   Sharea Guinther RN, [facility] Liaison  [phone]) Business Mobile [phone]) Toll free office

## 2016-11-13 NOTE — Discharge Instructions (Signed)
Hospice Hospice is a service that is designed to provide people who are terminally ill and their families with medical, spiritual, and psychological support. Its aim is to improve your quality of life by keeping you as alert and comfortable as possible. Hospice is performed by a team of health care professionals and volunteers who:  Help keep you comfortable. Hospice can be provided in your home or in a homelike setting. The hospice staff works with your family and friends to help meet your needs. You will enjoy the support of loved ones by receiving much of your basic care from family and friends.  Provide pain relief and manage your symptoms. The staff supply all necessary medicines and equipment.  Provide companionship when you are alone.  Allow you and your family to rest. They may do light housekeeping, prepare meals, and run errands.  Provide counseling. They will make sure your emotional, spiritual, and social needs and those of your family are being met.  Provide spiritual care. Spiritual care is individualized to meet your needs and your family's needs. It may involve helping you look at what death means to you, say goodbye, or perform a specific religious ceremony or ritual. Hospice teams often include:  A nurse.  A doctor.  Social workers.  Religious leaders (such as a Clinical biochemist).  Trained volunteers. WHEN SHOULD HOSPICE CARE BEGIN? Most people who use hospice are believed to have fewer than 6 months to live. Your family and health care providers can help you decide when hospice services should begin. If your condition improves, you may discontinue the program. WHAT [place]? Most hospice programs are run by nonprofit, independent organizations. Some are affiliated with hospitals, nursing homes, or home health care agencies. Hospice programs can take place in the home or at a hospice center, hospital, or skilled nursing facility. When choosing  a hospice program, ask the following questions:  What services are available to me?  What services are offered to my loved ones?  How involved are my loved ones?  How involved is my health care provider?  Who makes up the hospice care team? How are they trained or screened?  How will my pain and symptoms be managed?  If my circumstances change, can the services be provided in a different setting, such as my home or in the hospital?  Is the program reviewed and licensed by the state or certified in some other way? WHERE CAN I LEARN MORE ABOUT HOSPICE? You can learn about existing hospice programs in your area from your health care providers. You can also read more about hospice online. The websites of the following organizations contain helpful information:  The [facility] and Palliative Care Organization [facility]).  The Hospice Association of America ([place]).  The [place].  The American Cancer Society (ACS).  Hospice Net. This information is not intended to replace advice given to you by your health care provider. Make sure you discuss any questions you have with your health care provider. Document Released: 02/20/2004 Document Revised: 11/08/2013 Document Reviewed: 09/13/2013 Elsevier Interactive Patient Education  2017 [employer]. Chronic Respiratory Failure Respiratory failure is when your lungs are not working well and your breathing (respiratory) system fails. When respiratory failure occurs, it is difficult for your lungs to get enough oxygen or get rid of carbon dioxide or both. Respiratory failure can be life threatening. Respiratory failure can be acute or chronic. Acute respiratory failure is sudden, severe, and requires emergency medical  treatment. Chronic respiratory failure is less severe, happens over time, and requires ongoing treatment. What are the causes? Any problem affecting the heart or lungs can cause respiratory failure. Some of  these causes may be:  Chronic bronchitis and emphysema (COPD).  Blood clot going to the lung (pulmonary embolism).  Having water in the lungs caused by heart failure, lung injury, or infection (pulmonary edema).  Collapsed lung (pneumothorax).  Pneumonia.  Pulmonary fibrosis.  Obesity.  Asthma.  Heart failure.  Any type of trauma to the chest that can make breathing difficult.  Nerve or muscle diseases making chest movements difficult. What are the signs or symptoms? Signs and symptoms of chronic respiratory failure include:  Shortness of breath (dyspnea) with or without activity.  Rapid, fast breathing (tachypnea).  Wheezing.  Fast heart rate.  Bluish color to the fingernail or toenail beds.  Confusion or drowsiness or both. How is this diagnosed? Initial diagnosis requires a thorough history and a physical exam by your health care provider. Additional tests may include:  Chest X-ray.  CT scan of your lungs.  Ultrasound to check for blood clots.  Blood tests, such as an arterial blood gas test (ABG). This is a blood test that looks at the oxygen and carbon dioxide levels in your arterial blood.  Your vital signs will be taken. This includes your respiratory rate (how many times a minute you are breathing), oxygen saturation (this measures the oxygen level in your blood), heart rate, and blood pressure. These numbers help your health care provider determine the next steps.  Electrocardiogram. How is this treated? Treatment of chronic respiratory failure depends on the cause of the respiratory failure. Treatment can include the following:  Oxygen. Oxygen can be delivered through the following:  Nasal cannula. This is small tubing that goes in your nose to give you oxygen.  Face mask. A face mask covers your nose and mouth to give you oxygen.  Medicine. Different medicines can be given to help with breathing. These can include:  Nebulizers. Nebulizers  deliver medicines to open the air passages (bronchodilators). These medicines help to open or relax the airways in the lungs so you can breathe better. They can also help loosen mucus from your lungs.  Diuretics. Diuretic medicines can help you breathe better by getting rid of extra fluid in your body.  Steroids. Steroid medicines can help decrease inflammation in your lungs.  Chest tube. If you have a collapsed lung (pneumothorax), a chest tube is placed to help reinflate the lung.  Noninvasive positive pressure ventilation (NPPV). This is a tight-fitting mask that goes over your nose and mouth. The mask has tubing that is attached to a machine. The machine blows air into the tubing, which helps to keep the tiny air sacs (alveoli) in your lungs open. This machine allows you to breathe on your own.  Ventilator. A ventilator is a breathing machine. When on a ventilator, a breathing tube is put into the lungs. A ventilator is used when you can no longer breathe well enough on your own. You may have low oxygen levels or high carbon dioxide (CO2) levels in your blood. When you are on a ventilator, sedation and pain medicines are given to make you sleep so your lungs can heal. Follow these instructions at home:  Follow your health care provider's directions about medicines and respiratory therapy.  Quit smoking if you smoke. Contact a health care provider if:  You have increasing shortness of breath and are less  functional than you have been.  You have increased sputum, wheezing, coughing, or loss of energy.  You are on oxygen and are requiring more. Get help right away if:  Your shortness of breath is significantly worse.  You are unable to say more than a few words without having to catch your breath.  You are much less functional. This information is not intended to replace advice given to you by your health care provider. Make sure you discuss any questions you have with your health care  provider. Document Released: 11/03/2005 Document Revised: 04/10/2016 Document Reviewed: 09/01/2013 Elsevier Interactive Patient Education  2017 [employer].

## 2016-11-13 NOTE — Progress Notes (Signed)
Follow up visit made to new hospice home referral. Patient seen sitting up in bed, daughter in law at beside, son present as well. Patient did not receive her scheduled liquid morphine dose at 12 am or 6 am as she was sleeping. She appeared dyspneic and was coughing. Staff RN Tiffany to give scheduled dose prior to transport and also give guaifenesin syrup for cough. Writer provided education to family and patient regarding transfer to the hospice home today. Hospital care team all aware. Report called to the hospice home, EMS notified for transport. Signed portable DNR in place in discharge packet. Thank you. Flo Shanks RN, BSN, [facility] [place], [facility] [phone] c

## 2016-12-02 ENCOUNTER — Telehealth: Payer: Self-pay | Admitting: Internal Medicine

## 2016-12-02 NOTE — Telephone Encounter (Signed)
Patient son in law was told it was ok to drop off home device machine .  Placed in Nurse box.

## 2016-12-02 NOTE — Telephone Encounter (Signed)
Will forward to [place] as an [place].

## 2016-12-03 DIAGNOSIS — Z515 Encounter for palliative care: Secondary | ICD-10-CM

## 2016-12-03 DIAGNOSIS — J9601 Acute respiratory failure with hypoxia: Secondary | ICD-10-CM

## 2016-12-03 DIAGNOSIS — Z7189 Other specified counseling: Secondary | ICD-10-CM

## 2016-12-03 DIAGNOSIS — F411 Generalized anxiety disorder: Secondary | ICD-10-CM

## 2016-12-05 NOTE — Telephone Encounter (Signed)
[facility] return kit ordered to [employer].  Will plan to pick-up monitor from [place].

## 2016-12-18 DEATH — deceased

## 2017-01-04 IMAGING — CT CT HEAD W/O CM
1 series · 16 of 27 positions shown, 20 images · non-contrast
Comparison: 02/08/2016

CLINICAL DATA: Right arm numbness this morning. Blurry vision for 1
week.

EXAM:
CT HEAD WITHOUT CONTRAST
TECHNIQUE: Contiguous axial images were obtained from the base of the skull
through the vertex without contrast.

[Series 2: head wo · axial · 0.38mm/px · z∈[+284,+404]mm · 16 of 27 slices shown, 20 images]
[im 2/27  brain]
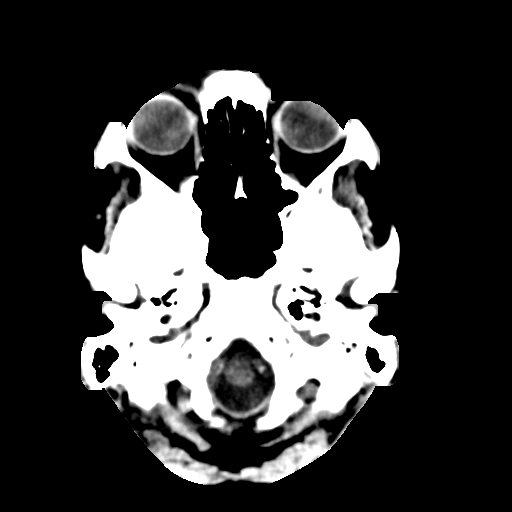
[im 2/27  bone]
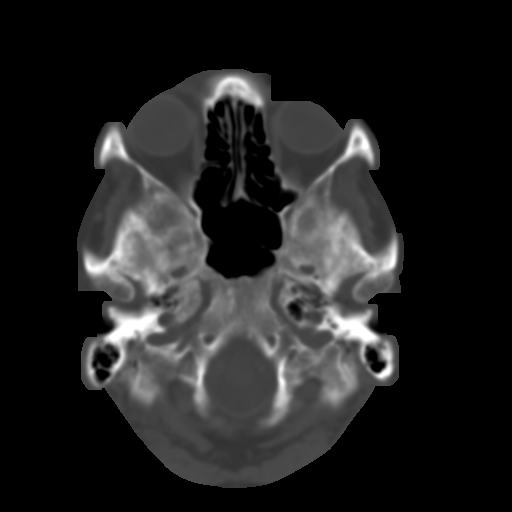
[im 4/27  brain]
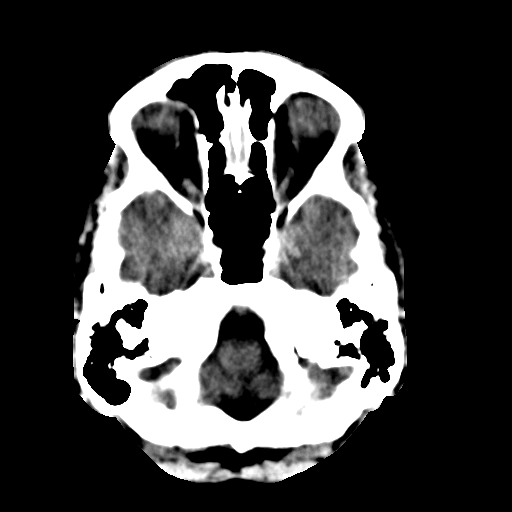
[im 5/27  brain]
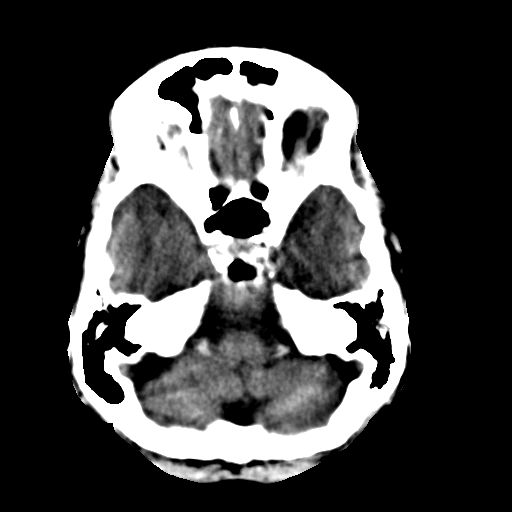
[im 7/27  brain]
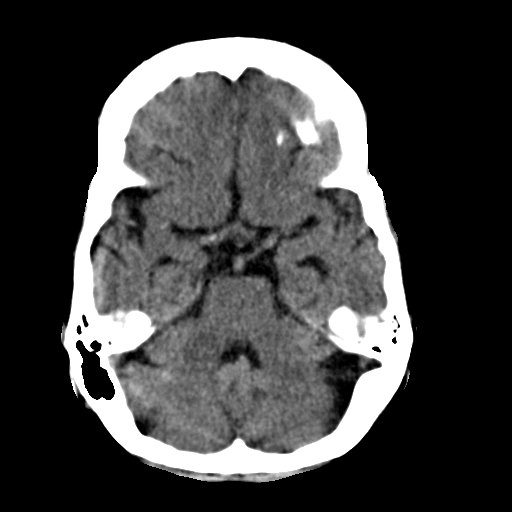
[im 9/27  brain]
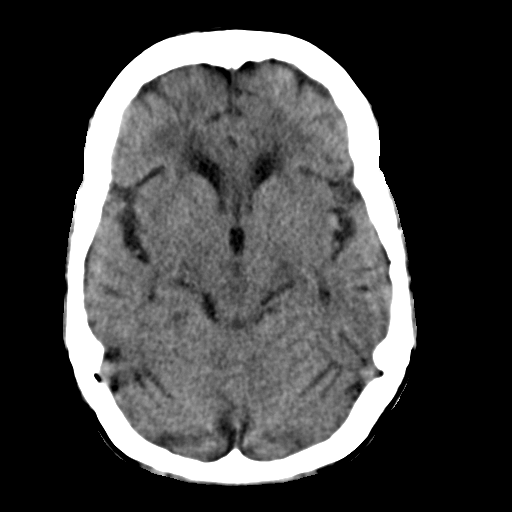
[im 9/27  bone]
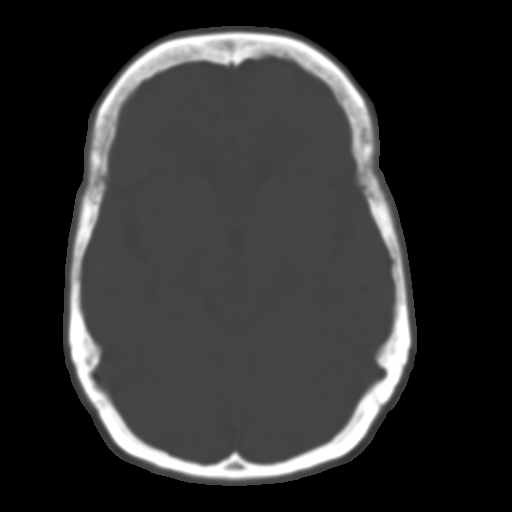
[im 10/27  brain]
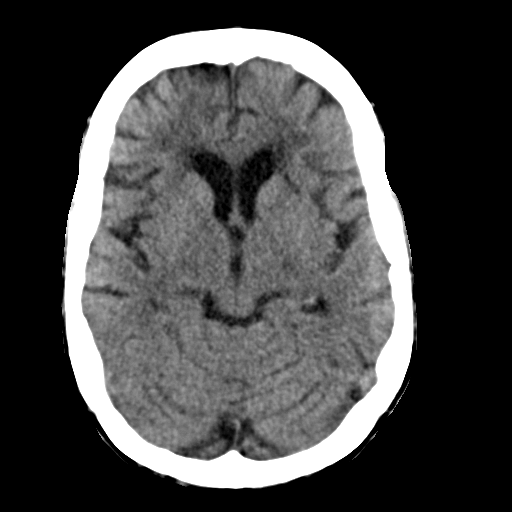
[im 12/27  brain]
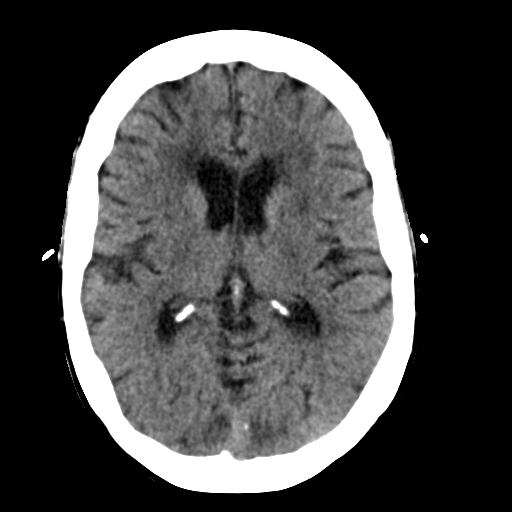
[im 13/27  brain]
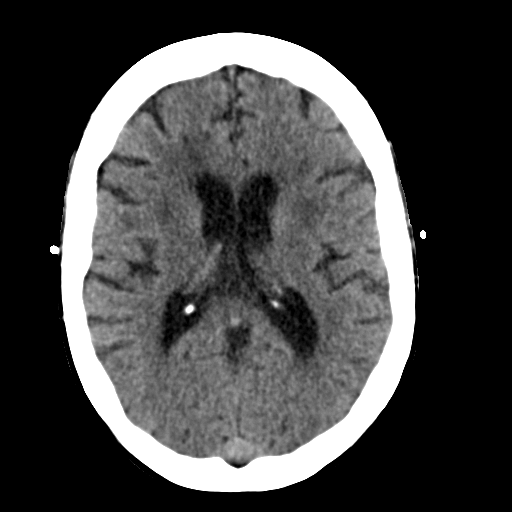
[im 15/27  brain]
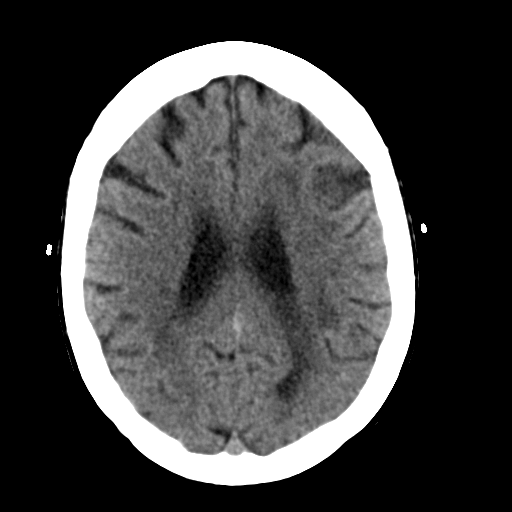
[im 15/27  bone]
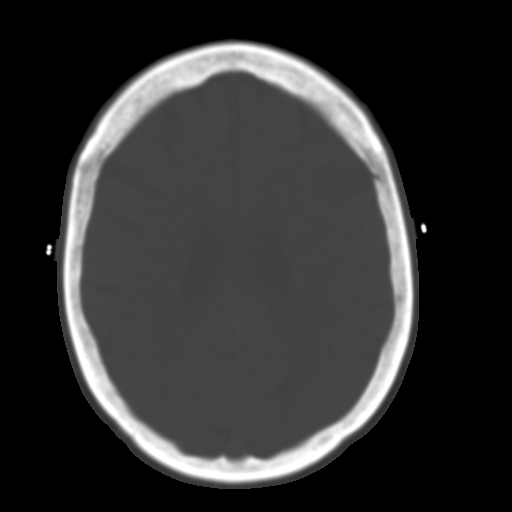
[im 16/27  brain]
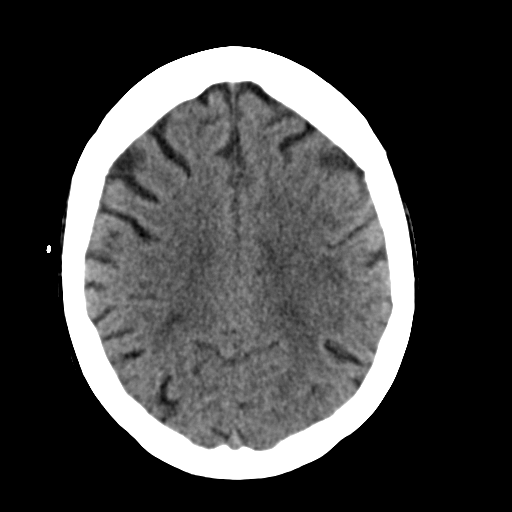
[im 18/27  brain]
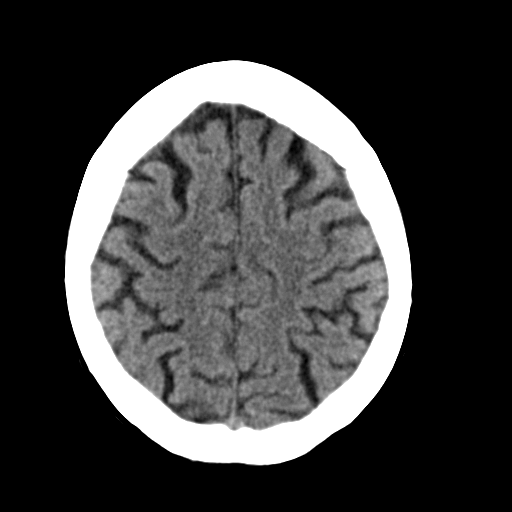
[im 19/27  brain]
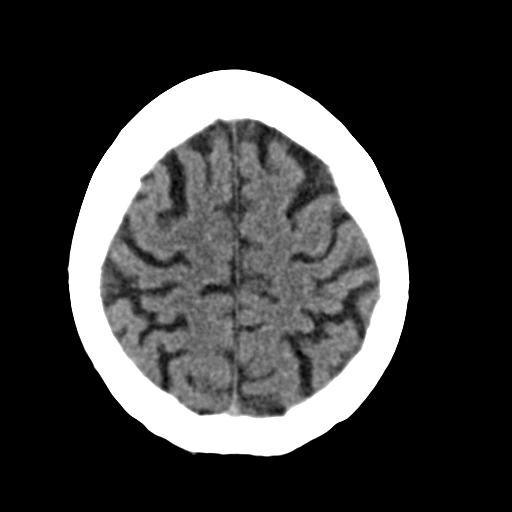
[im 21/27  brain]
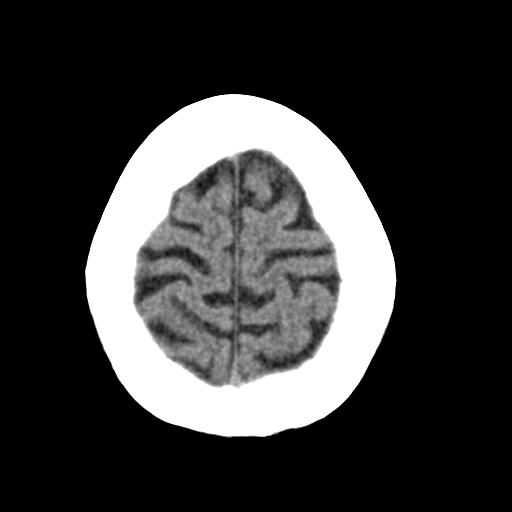
[im 21/27  bone]
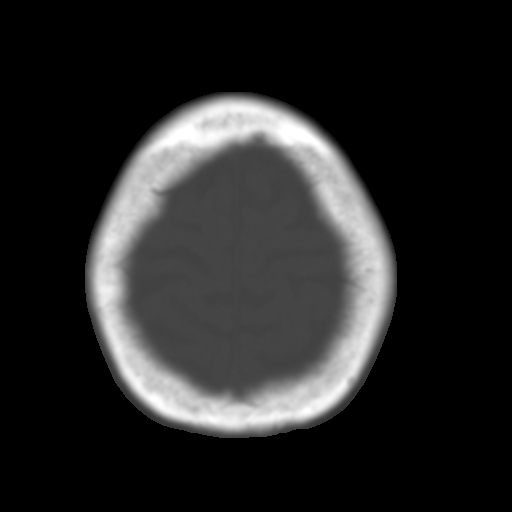
[im 23/27  brain]
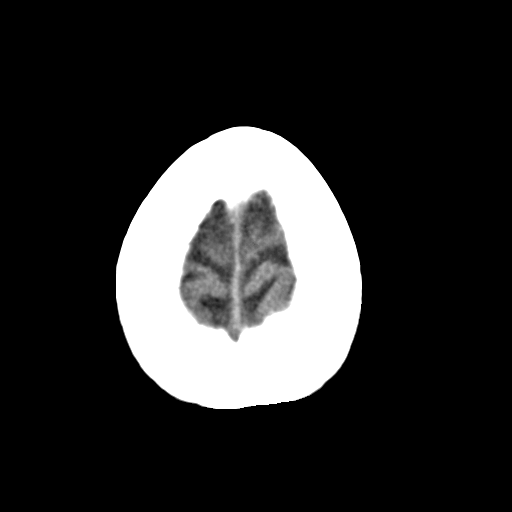
[im 24/27  brain]
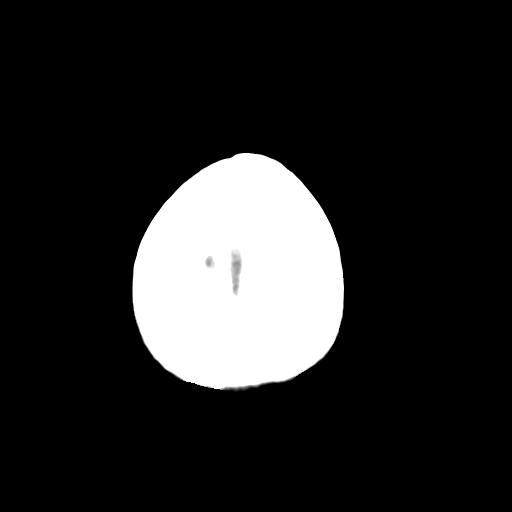
[im 26/27  brain]
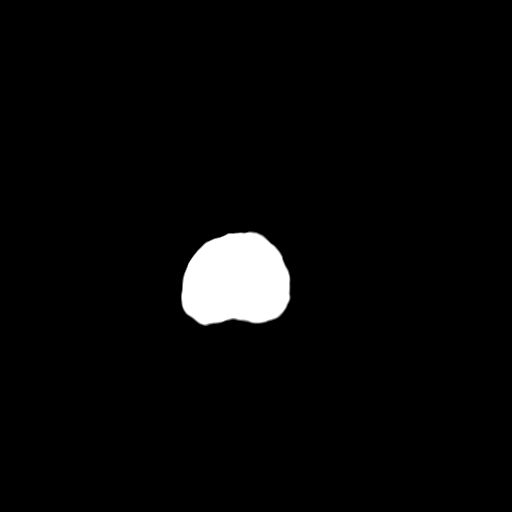

[16 of 27 positions shown; findings below may reference images not displayed]

FINDINGS: Stable low-density in the periventricular and subcortical white
matter. No evidence for acute hemorrhage, mass lesion, midline
shift, hydrocephalus or large infarct. Visualized paranasal sinuses
are clear. No calvarial fracture.
IMPRESSION: No acute intracranial abnormality.

Stable white matter disease. Findings likely represent chronic small
vessel ischemic changes.

## 2017-01-27 ENCOUNTER — Ambulatory Visit: Payer: PPO | Admitting: Cardiovascular Disease

## 2017-02-12 IMAGING — CR DG CHEST 2V
2 series · 2 of 2 positions shown · non-contrast
Comparison: 04/15/2016

CLINICAL DATA: Low oxygen saturation.  Shortness of breath.

EXAM:
CHEST  2 VIEW

[chest pa]
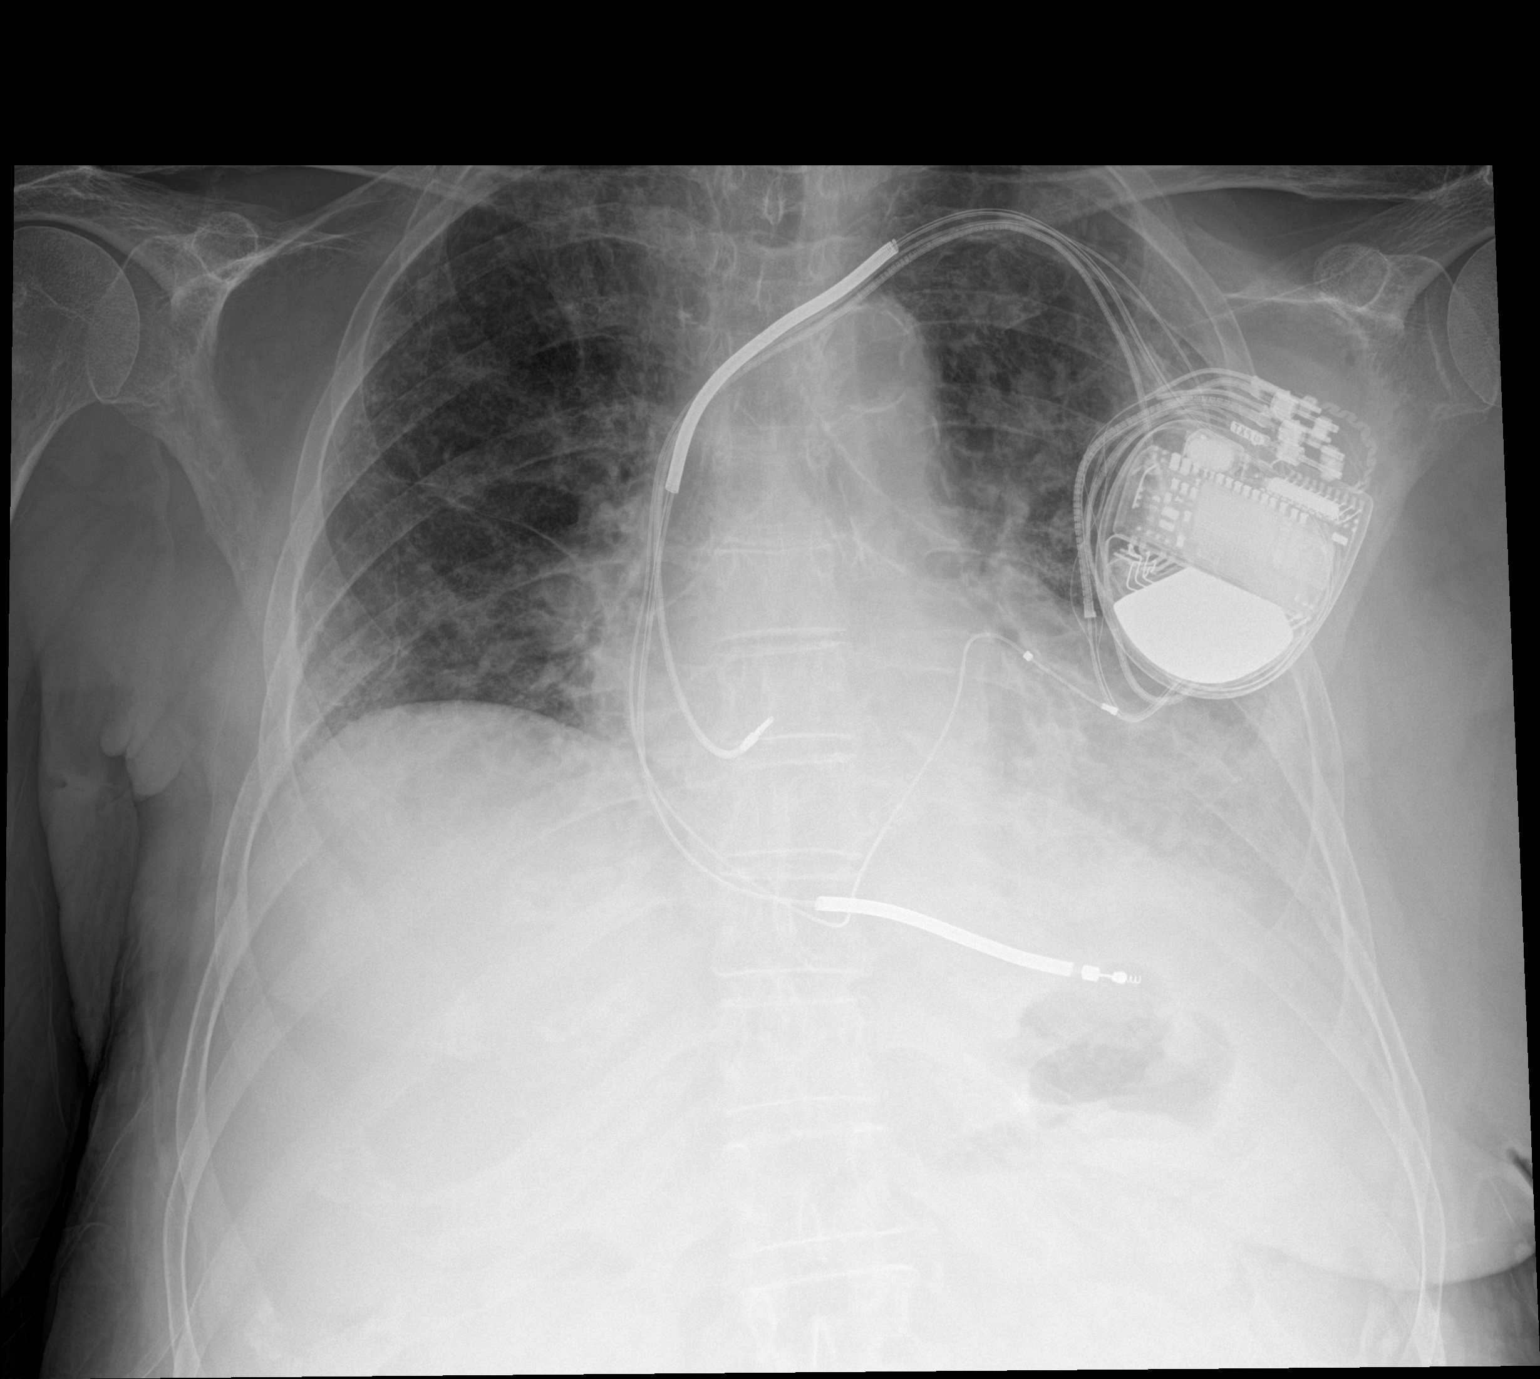

[chest lat]
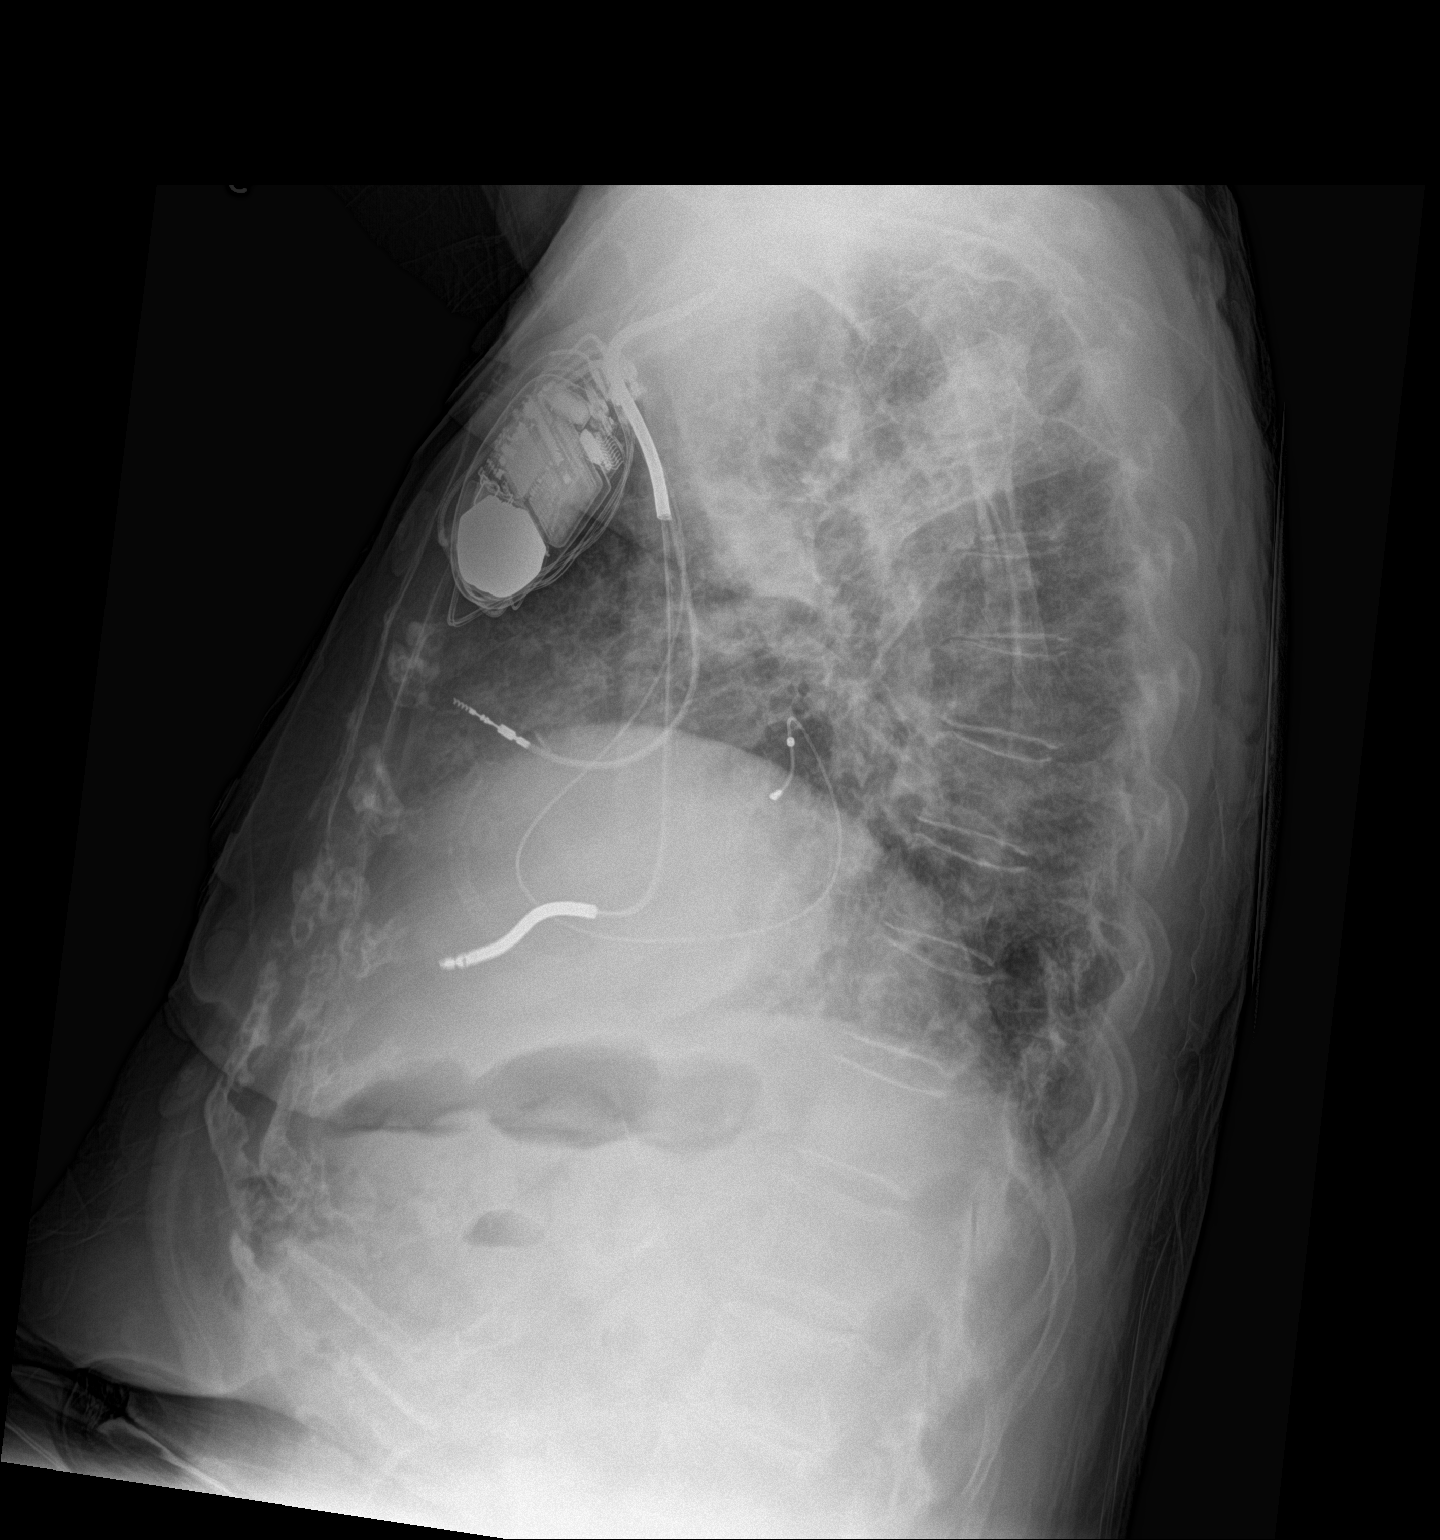

[2 of 2 positions shown; findings below may reference images not displayed]

FINDINGS: Left chest wall ICD is noted with leads in the right atrial
appendage, coronary sinus and right ventricle. Moderate cardiac
enlargement. There is a small left pleural effusion. Mild diffuse
interstitial edema is superimposed upon chronic lung disease.
IMPRESSION: 1. Cardiac enlargement, aortic atherosclerosis and mild CHF
2. Chronic lung disease.
